# Patient Record
Sex: Female | Born: 2000 | Race: Black or African American | Hispanic: No | Marital: Single | Smoking: Former smoker
Health system: Southern US, Community
[De-identification: ages and names within clinical notes are randomized; demographics above are authoritative.]

## PROBLEM LIST (undated history)

## (undated) ENCOUNTER — Inpatient Hospital Stay (HOSPITAL_COMMUNITY): Payer: Self-pay

## (undated) DIAGNOSIS — E119 Type 2 diabetes mellitus without complications: Secondary | ICD-10-CM

## (undated) DIAGNOSIS — F909 Attention-deficit hyperactivity disorder, unspecified type: Secondary | ICD-10-CM

## (undated) DIAGNOSIS — E669 Obesity, unspecified: Secondary | ICD-10-CM

## (undated) DIAGNOSIS — J45909 Unspecified asthma, uncomplicated: Secondary | ICD-10-CM

## (undated) DIAGNOSIS — R7303 Prediabetes: Secondary | ICD-10-CM

## (undated) DIAGNOSIS — G43909 Migraine, unspecified, not intractable, without status migrainosus: Secondary | ICD-10-CM

## (undated) DIAGNOSIS — F319 Bipolar disorder, unspecified: Secondary | ICD-10-CM

## (undated) DIAGNOSIS — F603 Borderline personality disorder: Secondary | ICD-10-CM

## (undated) DIAGNOSIS — J309 Allergic rhinitis, unspecified: Secondary | ICD-10-CM

## (undated) DIAGNOSIS — T50902A Poisoning by unspecified drugs, medicaments and biological substances, intentional self-harm, initial encounter: Secondary | ICD-10-CM

## (undated) DIAGNOSIS — F32A Depression, unspecified: Secondary | ICD-10-CM

## (undated) DIAGNOSIS — G4730 Sleep apnea, unspecified: Secondary | ICD-10-CM

## (undated) DIAGNOSIS — T1491XA Suicide attempt, initial encounter: Secondary | ICD-10-CM

## (undated) DIAGNOSIS — Z6841 Body Mass Index (BMI) 40.0 and over, adult: Secondary | ICD-10-CM

## (undated) HISTORY — DX: Body Mass Index (BMI) 40.0 and over, adult: Z684

## (undated) HISTORY — DX: Borderline personality disorder: F60.3

## (undated) HISTORY — DX: Prediabetes: R73.03

## (undated) HISTORY — PX: ADENOIDECTOMY: SUR15

## (undated) HISTORY — PX: TONSILLECTOMY: SUR1361

## (undated) HISTORY — DX: Migraine, unspecified, not intractable, without status migrainosus: G43.909

## (undated) HISTORY — PX: WISDOM TOOTH EXTRACTION: SHX21

## (undated) HISTORY — DX: Depression, unspecified: F32.A

---
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
## 2019-01-20 MED ORDER — MELATONIN 3 MG PO TABS
3.00 | ORAL_TABLET | ORAL | Status: DC
Start: 2019-01-20 — End: 2019-01-20

## 2019-01-20 MED ORDER — GENERIC EXTERNAL MEDICATION
10.00 | Status: DC
Start: ? — End: 2019-01-20

## 2019-01-20 MED ORDER — SODIUM CHLORIDE 0.9 % IV SOLN
10.00 | INTRAVENOUS | Status: DC
Start: ? — End: 2019-01-20

## 2019-01-20 MED ORDER — HYDROXYZINE PAMOATE 25 MG PO CAPS
25.00 | ORAL_CAPSULE | ORAL | Status: DC
Start: ? — End: 2019-01-20

## 2019-03-09 ENCOUNTER — Encounter (HOSPITAL_COMMUNITY): Payer: Self-pay | Admitting: Emergency Medicine

## 2019-03-09 ENCOUNTER — Inpatient Hospital Stay (HOSPITAL_COMMUNITY)
Admission: EM | Admit: 2019-03-09 | Discharge: 2019-03-12 | DRG: 918 | Disposition: A | Discharge status: Other Acute Inpatient Hospital | Payer: Medicaid Other | Attending: Internal Medicine | Admitting: Internal Medicine

## 2019-03-09 DIAGNOSIS — E66813 Obesity, class 3: Secondary | ICD-10-CM

## 2019-03-09 DIAGNOSIS — R000 Tachycardia, unspecified: Secondary | ICD-10-CM | POA: Diagnosis present

## 2019-03-09 DIAGNOSIS — T43212A Poisoning by selective serotonin and norepinephrine reuptake inhibitors, intentional self-harm, initial encounter: Secondary | ICD-10-CM | POA: Diagnosis present

## 2019-03-09 DIAGNOSIS — E669 Obesity, unspecified: Secondary | ICD-10-CM

## 2019-03-09 DIAGNOSIS — Z6841 Body Mass Index (BMI) 40.0 and over, adult: Secondary | ICD-10-CM

## 2019-03-09 DIAGNOSIS — I952 Hypotension due to drugs: Secondary | ICD-10-CM | POA: Diagnosis present

## 2019-03-09 DIAGNOSIS — T50901A Poisoning by unspecified drugs, medicaments and biological substances, accidental (unintentional), initial encounter: Secondary | ICD-10-CM | POA: Diagnosis present

## 2019-03-09 DIAGNOSIS — F319 Bipolar disorder, unspecified: Secondary | ICD-10-CM | POA: Diagnosis present

## 2019-03-09 DIAGNOSIS — T50902A Poisoning by unspecified drugs, medicaments and biological substances, intentional self-harm, initial encounter: Secondary | ICD-10-CM | POA: Diagnosis not present

## 2019-03-09 DIAGNOSIS — T426X2A Poisoning by other antiepileptic and sedative-hypnotic drugs, intentional self-harm, initial encounter: Principal | ICD-10-CM | POA: Diagnosis present

## 2019-03-09 DIAGNOSIS — Z7984 Long term (current) use of oral hypoglycemic drugs: Secondary | ICD-10-CM

## 2019-03-09 DIAGNOSIS — T461X2A Poisoning by calcium-channel blockers, intentional self-harm, initial encounter: Secondary | ICD-10-CM | POA: Diagnosis present

## 2019-03-09 DIAGNOSIS — J45909 Unspecified asthma, uncomplicated: Secondary | ICD-10-CM | POA: Diagnosis present

## 2019-03-09 DIAGNOSIS — E119 Type 2 diabetes mellitus without complications: Secondary | ICD-10-CM | POA: Diagnosis present

## 2019-03-09 DIAGNOSIS — G4733 Obstructive sleep apnea (adult) (pediatric): Secondary | ICD-10-CM | POA: Diagnosis present

## 2019-03-09 DIAGNOSIS — Z62820 Parent-biological child conflict: Secondary | ICD-10-CM | POA: Diagnosis present

## 2019-03-09 DIAGNOSIS — T1491XA Suicide attempt, initial encounter: Secondary | ICD-10-CM | POA: Diagnosis not present

## 2019-03-09 DIAGNOSIS — Z818 Family history of other mental and behavioral disorders: Secondary | ICD-10-CM

## 2019-03-09 DIAGNOSIS — R9431 Abnormal electrocardiogram [ECG] [EKG]: Secondary | ICD-10-CM | POA: Diagnosis present

## 2019-03-09 DIAGNOSIS — Z8249 Family history of ischemic heart disease and other diseases of the circulatory system: Secondary | ICD-10-CM

## 2019-03-09 DIAGNOSIS — E1169 Type 2 diabetes mellitus with other specified complication: Secondary | ICD-10-CM | POA: Diagnosis not present

## 2019-03-09 DIAGNOSIS — I10 Essential (primary) hypertension: Secondary | ICD-10-CM | POA: Diagnosis present

## 2019-03-09 DIAGNOSIS — Z915 Personal history of self-harm: Secondary | ICD-10-CM

## 2019-03-09 DIAGNOSIS — T383X2A Poisoning by insulin and oral hypoglycemic [antidiabetic] drugs, intentional self-harm, initial encounter: Secondary | ICD-10-CM | POA: Diagnosis present

## 2019-03-09 LAB — CBC WITH DIFFERENTIAL/PLATELET
Abs Immature Granulocytes: 0.05 10*3/uL (ref 0.00–0.07)
Abs Immature Granulocytes: 0.03 10*3/uL (ref 0.00–0.07)
Basophils Absolute: 0.1 10*3/uL (ref 0.0–0.1)
Basophils Absolute: 0.1 10*3/uL (ref 0.0–0.1)
Basophils Relative: 1 %
Basophils Relative: 1 %
Eosinophils Absolute: 0.3 10*3/uL (ref 0.0–0.5)
Eosinophils Absolute: 0.3 10*3/uL (ref 0.0–0.5)
Eosinophils Relative: 3 %
Eosinophils Relative: 3 %
HCT: 38.6 % (ref 36.0–46.0)
HCT: 36.2 % (ref 36.0–46.0)
Hemoglobin: 12.5 g/dL (ref 12.0–15.0)
Hemoglobin: 11.4 g/dL — ABNORMAL LOW (ref 12.0–15.0)
Immature Granulocytes: 1 %
Immature Granulocytes: 0 %
Lymphocytes Relative: 28 %
Lymphocytes Relative: 37 %
Lymphs Abs: 3.1 10*3/uL (ref 0.7–4.0)
Lymphs Abs: 3.8 10*3/uL (ref 0.7–4.0)
MCH: 30.3 pg (ref 26.0–34.0)
MCH: 29.8 pg (ref 26.0–34.0)
MCHC: 32.4 g/dL (ref 30.0–36.0)
MCHC: 31.5 g/dL (ref 30.0–36.0)
MCV: 93.5 fL (ref 80.0–100.0)
MCV: 94.5 fL (ref 80.0–100.0)
Monocytes Absolute: 0.5 10*3/uL (ref 0.1–1.0)
Monocytes Absolute: 0.6 10*3/uL (ref 0.1–1.0)
Monocytes Relative: 5 %
Monocytes Relative: 6 %
Neutro Abs: 6.8 10*3/uL (ref 1.7–7.7)
Neutro Abs: 5.6 10*3/uL (ref 1.7–7.7)
Neutrophils Relative %: 62 %
Neutrophils Relative %: 53 %
Platelets: 364 10*3/uL (ref 150–400)
Platelets: 337 10*3/uL (ref 150–400)
RBC: 4.13 MIL/uL (ref 3.87–5.11)
RBC: 3.83 MIL/uL — ABNORMAL LOW (ref 3.87–5.11)
RDW: 13.7 % (ref 11.5–15.5)
RDW: 13.8 % (ref 11.5–15.5)
WBC: 10.8 10*3/uL — ABNORMAL HIGH (ref 4.0–10.5)
WBC: 10.3 10*3/uL (ref 4.0–10.5)
nRBC: 0 % (ref 0.0–0.2)
nRBC: 0 % (ref 0.0–0.2)

## 2019-03-09 LAB — COMPREHENSIVE METABOLIC PANEL
ALT: 15 U/L (ref 0–44)
AST: 25 U/L (ref 15–41)
Albumin: 3.7 g/dL (ref 3.5–5.0)
Alkaline Phosphatase: 96 U/L (ref 38–126)
Anion gap: 8 (ref 5–15)
BUN: 12 mg/dL (ref 6–20)
CO2: 21 mmol/L — ABNORMAL LOW (ref 22–32)
Calcium: 8.7 mg/dL — ABNORMAL LOW (ref 8.9–10.3)
Chloride: 107 mmol/L (ref 98–111)
Creatinine, Ser: 0.92 mg/dL (ref 0.44–1.00)
GFR calc Af Amer: >60 mL/min (ref >60)
GFR calc non Af Amer: >60 mL/min (ref >60)
Glucose, Bld: 98 mg/dL (ref 70–99)
Potassium: 4.8 mmol/L (ref 3.5–5.1)
Sodium: 136 mmol/L (ref 135–145)
Total Bilirubin: 1 mg/dL (ref 0.3–1.2)
Total Protein: 7.5 g/dL (ref 6.5–8.1)

## 2019-03-09 LAB — BASIC METABOLIC PANEL
Anion gap: 5 (ref 5–15)
BUN: 12 mg/dL (ref 6–20)
CO2: 20 mmol/L — ABNORMAL LOW (ref 22–32)
Calcium: 8.2 mg/dL — ABNORMAL LOW (ref 8.9–10.3)
Chloride: 112 mmol/L — ABNORMAL HIGH (ref 98–111)
Creatinine, Ser: 0.77 mg/dL (ref 0.44–1.00)
GFR calc Af Amer: >60 mL/min (ref >60)
GFR calc non Af Amer: >60 mL/min (ref >60)
Glucose, Bld: 103 mg/dL — ABNORMAL HIGH (ref 70–99)
Potassium: 4 mmol/L (ref 3.5–5.1)
Sodium: 137 mmol/L (ref 135–145)

## 2019-03-09 LAB — GLUCOSE, CAPILLARY
Glucose-Capillary: 104 mg/dL — ABNORMAL HIGH (ref 70–99)
Glucose-Capillary: 82 mg/dL (ref 70–99)
Glucose-Capillary: 91 mg/dL (ref 70–99)
Glucose-Capillary: 93 mg/dL (ref 70–99)
Glucose-Capillary: 94 mg/dL (ref 70–99)

## 2019-03-09 LAB — POTASSIUM: Potassium: 3.7 mmol/L (ref 3.5–5.1)

## 2019-03-09 LAB — LACTIC ACID, PLASMA
Lactic Acid, Venous: 1.7 mmol/L (ref 0.5–1.9)
Lactic Acid, Venous: 1.6 mmol/L (ref 0.5–1.9)
Lactic Acid, Venous: 1.7 mmol/L (ref 0.5–1.9)

## 2019-03-09 LAB — RAPID URINE DRUG SCREEN, HOSP PERFORMED
Amphetamines: NOT DETECTED
Barbiturates: NOT DETECTED
Benzodiazepines: NOT DETECTED
Cocaine: NOT DETECTED
Opiates: NOT DETECTED
Tetrahydrocannabinol: NOT DETECTED

## 2019-03-09 LAB — HEPATIC FUNCTION PANEL
ALT: 15 U/L (ref 0–44)
AST: 16 U/L (ref 15–41)
Albumin: 3.4 g/dL — ABNORMAL LOW (ref 3.5–5.0)
Alkaline Phosphatase: 86 U/L (ref 38–126)
Bilirubin, Direct: 0.1 mg/dL (ref 0.0–0.2)
Indirect Bilirubin: 0.3 mg/dL (ref 0.3–0.9)
Total Bilirubin: 0.4 mg/dL (ref 0.3–1.2)
Total Protein: 6.7 g/dL (ref 6.5–8.1)

## 2019-03-09 LAB — ACETAMINOPHEN LEVEL
Acetaminophen (Tylenol), Serum: <10 ug/mL — ABNORMAL LOW (ref 10–30)
Acetaminophen (Tylenol), Serum: <10 ug/mL — ABNORMAL LOW (ref 10–30)

## 2019-03-09 LAB — ETHANOL: Alcohol, Ethyl (B): <10 mg/dL (ref <10)

## 2019-03-09 LAB — CBG MONITORING, ED
Glucose-Capillary: 84 mg/dL (ref 70–99)
Glucose-Capillary: 97 mg/dL (ref 70–99)

## 2019-03-09 LAB — MAGNESIUM
Magnesium: 1.7 mg/dL (ref 1.7–2.4)
Magnesium: 2.3 mg/dL (ref 1.7–2.4)
Magnesium: 1.9 mg/dL (ref 1.7–2.4)

## 2019-03-09 LAB — SALICYLATE LEVEL: Salicylate Lvl: <7 mg/dL (ref 2.8–30.0)

## 2019-03-09 MED ORDER — MAGNESIUM OXIDE 400 (241.3 MG) MG PO TABS
400.0000 mg | ORAL_TABLET | Freq: Two times a day (BID) | ORAL | Status: AC
  Administered 2019-03-09 – 2019-03-12 (×6): 400 mg via ORAL
  Filled 2019-03-09 (×6): qty 1

## 2019-03-09 MED ORDER — ALBUTEROL SULFATE HFA 108 (90 BASE) MCG/ACT IN AERS
1.0000 | INHALATION_SPRAY | Freq: Four times a day (QID) | RESPIRATORY_TRACT | Status: DC | PRN
  Administered 2019-03-09 – 2019-03-12 (×8): 1 via RESPIRATORY_TRACT
  Filled 2019-03-09: qty 6.7

## 2019-03-09 MED ORDER — SODIUM CHLORIDE 0.9 % IV BOLUS (SEPSIS)
1000.0000 mL | Freq: Once | INTRAVENOUS | Status: AC
  Administered 2019-03-09: 1000 mL via INTRAVENOUS

## 2019-03-09 MED ORDER — SODIUM CHLORIDE 0.9 % IV SOLN
INTRAVENOUS | Status: AC
  Administered 2019-03-09 – 2019-03-10 (×3): via INTRAVENOUS

## 2019-03-09 MED ORDER — AEROCHAMBER Z-STAT PLUS/MEDIUM MISC
1.0000 | Freq: Once | Status: AC
  Administered 2019-03-09: 01:00:00 1
  Filled 2019-03-09: qty 1

## 2019-03-09 MED ORDER — POTASSIUM CHLORIDE CRYS ER 20 MEQ PO TBCR
20.0000 meq | EXTENDED_RELEASE_TABLET | Freq: Every day | ORAL | Status: DC
  Administered 2019-03-09 – 2019-03-12 (×4): 20 meq via ORAL
  Filled 2019-03-09 (×4): qty 1

## 2019-03-09 MED ORDER — SODIUM CHLORIDE 0.9 % IV SOLN: INTRAVENOUS | Status: DC

## 2019-03-09 MED ORDER — ALBUTEROL SULFATE HFA 108 (90 BASE) MCG/ACT IN AERS
8.0000 | INHALATION_SPRAY | Freq: Once | RESPIRATORY_TRACT | Status: AC
  Administered 2019-03-09: 8 via RESPIRATORY_TRACT
  Filled 2019-03-09: qty 6.7

## 2019-03-09 MED ORDER — ONDANSETRON HCL 4 MG/2ML IJ SOLN
4.0000 mg | Freq: Once | INTRAMUSCULAR | Status: AC
  Administered 2019-03-09: 01:00:00 4 mg via INTRAVENOUS
  Filled 2019-03-09: qty 2

## 2019-03-09 MED ORDER — MAGNESIUM SULFATE 2 GM/50ML IV SOLN
2.0000 g | Freq: Once | INTRAVENOUS | Status: AC
  Administered 2019-03-09: 02:00:00 2 g via INTRAVENOUS
  Filled 2019-03-09: qty 50

## 2019-03-09 NOTE — Progress Notes (Signed)
Pt received from [facility] ED. VSS. Pt states that she still has suicidal ideations. Sitter at bedside. All unnecessary equipment removed from room. Telemetry applied. Will continue to monitor.  Versie Starks, RN

## 2019-03-09 NOTE — ED Notes (Signed)
Bed: RESB Expected date:  Expected time:  Means of arrival:  Comments: 52F OD/SI

## 2019-03-09 NOTE — ED Notes (Signed)
Pt is aware a urine sample is needed but is unable to provide one at this time. 

## 2019-03-09 NOTE — ED Notes (Signed)
Maryanna at [facility] 4E made aware that the pt will be transported via [facility]

## 2019-03-09 NOTE — Progress Notes (Signed)
Patient seen and examined, agree with admitting physicians inputs.  Please refer to the admission note done this morning.  Briefly 18 year old female with bipolar disorder, asthma, diabetes mellitus, hypertension with morbid obesity sleep apnea brought to the ER after intentional overdose with multiple medication for suicidal attempt.  " Patient took it around 12:10 AM this night.  Patient states she took gabapentin 2100 mg metformin 3500 mg Topamax 1400 mg venlafaxine 1050 mg and amlodipine 35 mg." Poison control was contacted this morning  QTC at 458 on admission, normal lactic acid, bicarb at 20-21 and advised monitoring for 24 hours On my exam patient was sleeping and woke up- was alert awake oriented x3, still having ongoing SI. HR in 70s and BP stable.per poison control Monitor Mental status for gabapentin Monitor lactate for metformin and has been normal- if significantly high may need dialysis. Monitor VS, bp/hr for amlodipine Monitor for serotonin syndrome for venlafaxine and check EKG q 6 hr for QTC and QRS- if qtc prolonged check mag/k and if QRS prolonged than 120 ms will need bicarb at 1-2 meq/kg until qrs corrects.  I have consulted inpatient psychiatry and you will need involuntary commitment if patient plans to leave AMA.
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
## 2019-03-09 NOTE — H&P (Signed)
History and Physical    Whitney Beard FMB:[id number] DOB: Aug 20, 2001 DOA: 03/09/2019  PCP: [name], MD  Patient coming from: Home.  Chief Complaint: Drug overdose.  HPI: Whitney Beard is a 18 y.o. female with history of bipolar disorder, asthma, diabetes mellitus, hypertension, morbid obesity, sleep apnea was brought to the ER after patient intentionally took multiple drug overdose with intention to kill herself.  Patient had some misunderstanding with the family member following which patient took this medications.  Patient took it around 12:10 AM this night.  Patient states she took gabapentin 2100 mg metformin 3500 mg Topamax 1400 mg venlafaxine 1050 mg and amlodipine 35 mg.   ED Course: In the ER patient was initially tachycardic and mildly hypotensive which improved with fluids.  EKG was showing sinus tachycardia with QTC of 458 ms QRS of 84 ms.  Lactate was normal.  Poison control at this time advised monitoring for 24 hours.  On my exam patient is sleepy but easily arousable and oriented to name and place.  Review of Systems: As per HPI, rest all negative.   Past Medical History:  Diagnosis Date  . ADHD (attention deficit hyperactivity disorder)   . Allergic rhinitis   . Asthma   . Bipolar 1 disorder (HCC)   . Diabetes mellitus without complication (HCC)    states today 03/09/16 "was told in the past that she was borderline diabetic"  . Obesity   . Sleep apnea   . Suicide attempt by drug ingestion [facility])     Past Surgical History:  Procedure Laterality Date  . ADENOIDECTOMY    . TONSILLECTOMY       reports that she has never smoked. She has never used smokeless tobacco. She reports that she does not drink alcohol or use drugs.  No Known Allergies  Family History  Problem Relation Age of Onset  . Bipolar disorder Father   . Schizophrenia Father   . Bipolar disorder Maternal Grandmother   . SIDS Maternal Aunt   . Heart disease Paternal Grandmother     Prior to  Admission medications   Medication Sig Start Date End Date Taking? Authorizing Provider  albuterol (PROVENTIL HFA;VENTOLIN HFA) 108 (90 Base) MCG/ACT inhaler Inhale 2 puffs into the lungs every 4 (four) hours as needed. For wheeze or shortness of breath 12/27/18  Yes Pokhrel, Laxman, MD  amLODipine (NORVASC) 5 MG tablet Take 1 tablet (5 mg total) by mouth daily for 30 days. 12/27/18 03/09/19 Yes Pokhrel, Laxman, MD  beclomethasone (QVAR) 80 MCG/ACT inhaler Inhale 2 puffs into the lungs 2 (two) times daily. 01/22/17  Yes Duffus, Huntley Dec, MD  lurasidone (LATUDA) 20 MG TABS tablet Take 1 tablet (20 mg total) by mouth daily with breakfast. 01/19/18  Yes Denzil Magnuson, NP  topiramate (TOPAMAX) 100 MG tablet Take 1 tablet (100 mg total) by mouth 2 (two) times daily. 03/19/16  Yes Denzil Magnuson, NP  albuterol (PROVENTIL) (2.5 MG/3ML) 0.083% nebulizer solution Take 3 mLs (2.5 mg total) by nebulization every 6 (six) hours as needed for wheezing or shortness of breath. 12/27/18   Pokhrel, Rebekah Chesterfield, MD  escitalopram (LEXAPRO) 10 MG tablet Take 1 tablet (10 mg total) by mouth at bedtime. Patient not taking: Reported on 12/26/2018 01/19/18   Denzil Magnuson, NP  hydrOXYzine (ATARAX/VISTARIL) 10 MG tablet Take 1 tablet (10 mg total) by mouth 3 (three) times daily as needed for anxiety. Patient not taking: Reported on 12/26/2018 01/19/18   Denzil Magnuson, NP  metFORMIN (GLUCOPHAGE) 500 MG tablet  Take 1 tablet (500 mg total) by mouth daily with breakfast. Patient not taking: Reported on 12/26/2018 01/19/18   Denzil Magnusonhomas, Lashunda, NP  predniSONE (STERAPRED UNI-PAK 21 TAB) 10 MG (21) TBPK tablet 3 tab orally daily x 2 days then 2 tab po daily x 2 days then 1 tab po daily x 2 days 12/27/18   Joycelyn DasPokhrel, Laxman, MD    Physical Exam: Vitals:   03/09/19 0220 03/09/19 0300 03/09/19 0344 03/09/19 0432  BP: (!) 142/73 (!) 154/91 (!) 154/91 (!) 154/86  Pulse: (!) 104  96 99  Resp: (!) 23 (!) 21 20 20   Temp:      TempSrc:      SpO2:  98% 98% 99% 98%  Weight:      Height:          Constitutional: Moderately built and nourished. Vitals:   03/09/19 0220 03/09/19 0300 03/09/19 0344 03/09/19 0432  BP: (!) 142/73 (!) 154/91 (!) 154/91 (!) 154/86  Pulse: (!) 104  96 99  Resp: (!) 23 (!) 21 20 20   Temp:      TempSrc:      SpO2: 98% 98% 99% 98%  Weight:      Height:       Eyes: Anicteric no pallor. ENMT: No discharge from the ears eyes nose and mouth. Neck: No mass felt.  No neck rigidity. Respiratory: No rhonchi or crepitations. Cardiovascular: S1-S2 heard. Abdomen: Soft nontender bowel sounds present. Musculoskeletal: No edema.  No joint effusion. Skin: No rash. Neurologic: Sleepy but easily arousable follows commands moves all extremities.  Oriented to time place and person. Psychiatric: Depressed and suicidal.   Labs on Admission: I have personally reviewed following labs and imaging studies  CBC: Recent Labs  Lab 03/09/19 0056  WBC 10.8*  NEUTROABS 6.8  HGB 12.5  HCT 38.6  MCV 93.5  PLT 364   Basic Metabolic Panel: Recent Labs  Lab 03/09/19 0056 03/09/19 0100  NA 136  --   K 4.8  --   CL 107  --   CO2 21*  --   GLUCOSE 98  --   BUN 12  --   CREATININE 0.92  --   CALCIUM 8.7*  --   MG  --  1.7   GFR: Estimated Creatinine Clearance: 158 mL/min (by C-G formula based on SCr of 0.92 mg/dL). Liver Function Tests: Recent Labs  Lab 03/09/19 0056  AST 25  ALT 15  ALKPHOS 96  BILITOT 1.0  PROT 7.5  ALBUMIN 3.7   No results for input(s): LIPASE, AMYLASE in the last 168 hours. No results for input(s): AMMONIA in the last 168 hours. Coagulation Profile: No results for input(s): INR, PROTIME in the last 168 hours. Cardiac Enzymes: No results for input(s): CKTOTAL, CKMB, CKMBINDEX, TROPONINI in the last 168 hours. BNP (last 3 results) No results for input(s): PROBNP in the last 8760 hours. HbA1C: No results for input(s): HGBA1C in the last 72 hours. CBG: Recent Labs  Lab 03/09/19  0047  GLUCAP 84   Lipid Profile: No results for input(s): CHOL, HDL, LDLCALC, TRIG, CHOLHDL, LDLDIRECT in the last 72 hours. Thyroid Function Tests: No results for input(s): TSH, T4TOTAL, FREET4, T3FREE, THYROIDAB in the last 72 hours. Anemia Panel: No results for input(s): VITAMINB12, FOLATE, FERRITIN, TIBC, IRON, RETICCTPCT in the last 72 hours. Urine analysis:    Component Value Date/Time   COLORURINE YELLOW 01/24/2017 1140   APPEARANCEUR HAZY (A) 01/24/2017 1140   LABSPEC 1.025 01/24/2017 1140  PHURINE 5.0 01/24/2017 1140   GLUCOSEU NEGATIVE 01/24/2017 1140   HGBUR NEGATIVE 01/24/2017 1140   BILIRUBINUR NEGATIVE 01/24/2017 1140   KETONESUR NEGATIVE 01/24/2017 1140   PROTEINUR NEGATIVE 01/24/2017 1140   UROBILINOGEN 0.2 01/08/2013 2339   NITRITE NEGATIVE 01/24/2017 1140   LEUKOCYTESUR NEGATIVE 01/24/2017 1140   Sepsis Labs: (procalcitonin:4,lacticidven:4) )No results found for this or any previous visit (from the past 240 hour(s)).   Radiological Exams on Admission: No results found.  EKG: Independently reviewed.  Sinus tachycardia QTC 458 ms QRS 84 ms.  Assessment/Plan Principal Problem:   Drug overdose Active Problems:   Overdose, intentional self-harm, initial encounter (HCC)   OSA (obstructive sleep apnea)   Morbid obesity with BMI of 50.0-59.9, adult (HCC)   Diabetes mellitus type 2 in obese (HCC)    1. Intentional drug overdose with multiple drugs as mentioned history of present illness which includes gabapentin metformin amlodipine Topamax venlafaxine.  Poison control has advised to follow mental status for gabapentin overdose and lactate levels for metformin overdose.  If lactate levels go very significantly high may need dialysis.  If amlodipine to watch out for hypotension and bradycardia.  And for venlafaxine closely monitor for any serotonin syndrome and also check EKG every 6 hourly for next 24 hours since we do not know if patient took  long-acting venlafaxine or short acting and need to monitor QTC and QRS every 6 hours.  QTC is prolonged then need to check potassium and mag keep it at the higher level of normal.  And if QRS is prolonged more than 120 ms we need to give 1 to 2 mEq/kg body weight of bicarb until QRS corrects. 2. Depression with suicidal ideation -on suicide precaution consult psychiatry. 3. Diabetes mellitus type 2 with metformin overdose for now I have only kept patient on CBG checks did not placed on any coverage at this time. 4. Hypertension with amlodipine overdose close monitoring for blood pressure trends.   DVT prophylaxis: SCDs. Code Status: Full code. Family Communication: No family at the bedside. Disposition Plan: To be determined. Consults called: None. Admission status: Observation.   Eduard Clos MD [facility] Pager [phone].  If 7PM-7AM, please contact night-coverage [email or web address] Password [email or web address]  03/09/2019, 5:03 AM

## 2019-03-09 NOTE — ED Notes (Signed)
Pt changed into gown, belongings placed in cabinets at nurses station.

## 2019-03-09 NOTE — ED Notes (Signed)
[facility] called and notified need for transport. Per [facility]: May be 4-5 hrs until pt can be transported
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
## 2019-03-09 NOTE — ED Notes (Signed)
[employer] called for non-emergent transport to [facility]. Dispatch to send transport.

## 2019-03-09 NOTE — ED Notes (Signed)
Report given to [facility] for transport to [facility]

## 2019-03-09 NOTE — ED Notes (Signed)
Spoke with PC and there were already aware of patient's condition and overdose. PC recommended an acetaminophen level at 4 am, a CMP with lactate, and magnesium. PC said if lactate is high and bicarb is low, they may consider dialysis. Said gabapentin would make patient drowsy, metformin would causes lactic acidosis, amlodipine can cause low HR and BP and there is a potential for seizures.
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
## 2019-03-09 NOTE — ED Notes (Addendum)
Attempted to call report to [facility]. Unable to take report at this time. RN to call back for report

## 2019-03-09 NOTE — ED Notes (Signed)
EKG due every 6 hours per hospitalist.
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
## 2019-03-10 ENCOUNTER — Other Ambulatory Visit: Payer: Self-pay

## 2019-03-10 LAB — COMPREHENSIVE METABOLIC PANEL
ALT: 13 U/L (ref 0–44)
AST: 11 U/L — ABNORMAL LOW (ref 15–41)
Albumin: 3 g/dL — ABNORMAL LOW (ref 3.5–5.0)
Alkaline Phosphatase: 90 U/L (ref 38–126)
Anion gap: 8 (ref 5–15)
BUN: 8 mg/dL (ref 6–20)
CO2: 20 mmol/L — ABNORMAL LOW (ref 22–32)
Calcium: 8.7 mg/dL — ABNORMAL LOW (ref 8.9–10.3)
Chloride: 108 mmol/L (ref 98–111)
Creatinine, Ser: 0.82 mg/dL (ref 0.44–1.00)
GFR calc Af Amer: >60 mL/min (ref >60)
GFR calc non Af Amer: >60 mL/min (ref >60)
Glucose, Bld: 96 mg/dL (ref 70–99)
Potassium: 4.3 mmol/L (ref 3.5–5.1)
Sodium: 136 mmol/L (ref 135–145)
Total Bilirubin: 0.4 mg/dL (ref 0.3–1.2)
Total Protein: 6 g/dL — ABNORMAL LOW (ref 6.5–8.1)

## 2019-03-10 LAB — GLUCOSE, CAPILLARY
Glucose-Capillary: 110 mg/dL — ABNORMAL HIGH (ref 70–99)
Glucose-Capillary: 70 mg/dL (ref 70–99)
Glucose-Capillary: 89 mg/dL (ref 70–99)
Glucose-Capillary: 93 mg/dL (ref 70–99)
Glucose-Capillary: 96 mg/dL (ref 70–99)
Glucose-Capillary: 99 mg/dL (ref 70–99)

## 2019-03-10 LAB — LACTIC ACID, PLASMA: Lactic Acid, Venous: 1.3 mmol/L (ref 0.5–1.9)

## 2019-03-10 MED ORDER — ACETAMINOPHEN 325 MG PO TABS
650.0000 mg | ORAL_TABLET | Freq: Four times a day (QID) | ORAL | Status: DC | PRN
  Administered 2019-03-10 – 2019-03-11 (×3): 650 mg via ORAL
  Filled 2019-03-10 (×4): qty 2

## 2019-03-10 NOTE — Plan of Care (Signed)
  Problem: Clinical Measurements: Goal: Will remain free from infection Outcome: Progressing Goal: Cardiovascular complication will be avoided Outcome: Progressing   

## 2019-03-10 NOTE — Discharge Summary (Addendum)
Physician Discharge Summary  Whitney Beard RUE:[id number] DOB: Aug 29, 2001 DOA: 03/09/2019  PCP: [name], MD  Admit date: 03/09/2019 Discharge date: 03/12/2019  Admitted From: home Disposition:  Inpatient psych  Recommendations for Outpatient Follow-up:  1. Follow up with PCP/psych in 1-2 weeks  Home Health:no  Equipment/Devices:none Discharge Condition:stable  CODE STATUS:full  Diet recommendation: Cardiac diabetic diet  Brief/Interim Summary: 18 year old female with bipolar disorder, asthma, diabetes mellitus, hypertension with morbid obesity sleep apnea brought to the ER after intentional overdose with multiple medication for suicidal attempt.  "Patient took it around 12:10 AM this night. Patient states she took gabapentin 2100 mg metformin 3500 mg Topamax 1400 mg venlafaxine 1050 mg and amlodipine 35 mg." Poison control was contacted and advised monitoring 24 hrs. QTC at 458 on admission and now470 stable, qrs stable. normal lactic acid, labs stable She is aa0x4, agreeable w POC for inpatient psych, initially somewhat reluctant. BP, HR stable. Mental status stable. Patient is medically stable for discharge to inpatient psychiatric facility as per psychiatry recommendation. HTN: bp stable. meds on hold. Resume amlodipine 5 mg in 1-2 days if BP starts to go above 140. Rest of her psychotropic Home meds on hold, assess for need to resume at inpatient psychiatry. DM- resume metformin 4/11 Asthma- w wheezing. Cont po steroid low dose and home broncholdilateors  Discharge Diagnoses:  Principal Problem:   Suicide attempt [facility]) Active Problems:   Overdose, intentional self-harm, initial encounter (HCC)   OSA (obstructive sleep apnea)   Morbid obesity with BMI of 50.0-59.9, adult (HCC)   Drug overdose   Diabetes mellitus type 2 in obese [facility])  Discharge Instructions  Discharge Instructions    Call MD for:  persistant nausea and vomiting   Complete by:  As directed    Diet -  low sodium heart healthy   Complete by:  As directed    Diet Carb Modified   Complete by:  As directed    Increase activity slowly   Complete by:  As directed      Allergies as of 03/12/2019   No Known Allergies     Medication List    STOP taking these medications   amLODipine 5 MG tablet Commonly known as:  NORVASC   gabapentin 300 MG capsule Commonly known as:  NEURONTIN   topiramate 100 MG tablet Commonly known as:  TOPAMAX   venlafaxine XR 150 MG 24 hr capsule Commonly known as:  EFFEXOR-XR     TAKE these medications   albuterol 108 (90 Base) MCG/ACT inhaler Commonly known as:  PROVENTIL HFA;VENTOLIN HFA Inhale 2 puffs into the lungs every 4 (four) hours as needed. For wheeze or shortness of breath What changed:    reasons to take this  additional instructions   albuterol (2.5 MG/3ML) 0.083% nebulizer solution Commonly known as:  PROVENTIL Take 3 mLs (2.5 mg total) by nebulization every 6 (six) hours as needed for wheezing or shortness of breath. What changed:  Another medication with the same name was changed. Make sure you understand how and when to take each.   beclomethasone 80 MCG/ACT inhaler Commonly known as:  QVAR Inhale 2 puffs into the lungs 2 (two) times daily.   hydrOXYzine 10 MG tablet Commonly known as:  ATARAX/VISTARIL Take 1 tablet (10 mg total) by mouth 3 (three) times daily as needed for anxiety.   lurasidone 20 MG Tabs tablet Commonly known as:  LATUDA Take 1 tablet (20 mg total) by mouth daily with breakfast.   metFORMIN 500 MG  tablet Commonly known as:  GLUCOPHAGE Take 1 tablet (500 mg total) by mouth daily with breakfast.   montelukast 10 MG tablet Commonly known as:  SINGULAIR Take 10 mg by mouth at bedtime.   predniSONE 20 MG tablet Commonly known as:  DELTASONE Take 1 tablet (20 mg total) by mouth daily with breakfast for 4 days.       No Known Allergies  Consultations: Psychiatry  Procedures/Studies: No results  found.   Subjective: Resting well, alert awake oriented x3.  No new complaints.  Vitals stable.  Discharge Exam: Vitals:   03/12/19 1245 03/12/19 1412  BP:    Pulse: (!) 116 (!) 116  Resp: (!) 26 20  Temp:    SpO2: 98% 99%   Vitals:   03/12/19 0857 03/12/19 1204 03/12/19 1245 03/12/19 1412  BP: 125/87 113/60    Pulse: 90 (!) 116 (!) 116 (!) 116  Resp: (!) 25 (!) 29 (!) 26 20  Temp: 98.7 F (37.1 C) 98.8 F (37.1 C)    TempSrc: Oral Oral    SpO2:   98% 99%  Weight:      Height:        General: Pt is alert, awake, not in acute distress Cardiovascular: RRR, S1/S2 +, no rubs, no gallops Respiratory: CTA bilaterally, no wheezing, no rhonchi Abdominal: Soft, NT, ND, bowel sounds + Extremities: no edema, no cyanosis   The results of significant diagnostics from this hospitalization (including imaging, microbiology, ancillary and laboratory) are listed below for reference.     Microbiology: No results found for this or any previous visit (from the past 240 hour(s)).   Labs: BNP (last 3 results) No results for input(s): BNP in the last 8760 hours. Basic Metabolic Panel: Recent Labs  Lab 03/09/19 0056 03/09/19 0100 03/09/19 0527 03/09/19 1424 03/10/19 0923  NA 136  --  137  --  136  K 4.8  --  4.0 3.7 4.3  CL 107  --  112*  --  108  CO2 21*  --  20*  --  20*  GLUCOSE 98  --  103*  --  96  BUN 12  --  12  --  8  CREATININE 0.92  --  0.77  --  0.82  CALCIUM 8.7*  --  8.2*  --  8.7*  MG  --  1.7 2.3 1.9  --    Liver Function Tests: Recent Labs  Lab 03/09/19 0056 03/09/19 0527 03/10/19 0923  AST 25 16 11*  ALT 15 15 13   ALKPHOS 96 86 90  BILITOT 1.0 0.4 0.4  PROT 7.5 6.7 6.0*  ALBUMIN 3.7 3.4* 3.0*   No results for input(s): LIPASE, AMYLASE in the last 168 hours. No results for input(s): AMMONIA in the last 168 hours. CBC: Recent Labs  Lab 03/09/19 0056 03/09/19 0527  WBC 10.8* 10.3  NEUTROABS 6.8 5.6  HGB 12.5 11.4*  HCT 38.6 36.2  MCV 93.5  94.5  PLT 364 337   Cardiac Enzymes: No results for input(s): CKTOTAL, CKMB, CKMBINDEX, TROPONINI in the last 168 hours. BNP: Invalid input(s): POCBNP CBG: Recent Labs  Lab 03/11/19 1113 03/11/19 1556 03/11/19 1955 03/12/19 0713 03/12/19 1111  GLUCAP 96 101* 79 107* 166*   D-Dimer No results for input(s): DDIMER in the last 72 hours. Hgb A1c No results for input(s): HGBA1C in the last 72 hours. Lipid Profile No results for input(s): CHOL, HDL, LDLCALC, TRIG, CHOLHDL, LDLDIRECT in the last 72 hours. Thyroid function studies  No results for input(s): TSH, T4TOTAL, T3FREE, THYROIDAB in the last 72 hours.  Invalid input(s): FREET3 Anemia work up No results for input(s): VITAMINB12, FOLATE, FERRITIN, TIBC, IRON, RETICCTPCT in the last 72 hours. Urinalysis    Component Value Date/Time   COLORURINE YELLOW 01/24/2017 1140   APPEARANCEUR HAZY (A) 01/24/2017 1140   LABSPEC 1.025 01/24/2017 1140   PHURINE 5.0 01/24/2017 1140   GLUCOSEU NEGATIVE 01/24/2017 1140   HGBUR NEGATIVE 01/24/2017 1140   BILIRUBINUR NEGATIVE 01/24/2017 1140   KETONESUR NEGATIVE 01/24/2017 1140   PROTEINUR NEGATIVE 01/24/2017 1140   UROBILINOGEN 0.2 01/08/2013 2339   NITRITE NEGATIVE 01/24/2017 1140   LEUKOCYTESUR NEGATIVE 01/24/2017 1140   Sepsis Labs Invalid input(s): PROCALCITONIN,  WBC,  LACTICIDVEN Microbiology No results found for this or any previous visit (from the past 240 hour(s)).   Time coordinating discharge: 25 minutes  SIGNED:   Lanae Boast, MD  [facility] 03/12/2019, 2:50 PM  If 7PM-7AM, please contact night-coverage [email or web address]

## 2019-03-10 NOTE — TOC Progression Note (Signed)
Transition of Care [facility]) - Progression Note    Patient Details  Name: Whitney Beard MRN: [id number] Date of Birth: 02-04-2001  Transition of Care [facility]) CM/SW Contact  Mearl Latin, LCSW Phone Number: 03/10/2019, 4:33 PM  Clinical Narrative:    [facility] reviewing referral. [facility] denied due to medical acuity. [facility] unable to accept patients 18 and under if in school. Patient states she does not want to go to several facilities, however, patient will need to go to the first available bed. Will IVC patient if needed if she ends up declining bed offer.    Expected Discharge Plan: Psychiatric Hospital Barriers to Discharge: Psych Bed not available  Expected Discharge Plan and Services Expected Discharge Plan: Psychiatric Hospital In-house Referral: Clinical Social Work Discharge Planning Services: NA Post Acute Care Choice: NA Living arrangements for the past 2 months: Single Family Home Expected Discharge Date: 03/10/19               DME Arranged: N/A DME Agency: NA HH Arranged: NA HH Agency: NA   Social Determinants of Health (SDOH) Interventions    Readmission Risk Interventions Readmission Risk Prevention Plan 03/10/2019  Post Dischage Appt Complete  Medication Screening Complete  Transportation Screening Complete  Some recent data might be hidden

## 2019-03-10 NOTE — TOC Initial Note (Signed)
Transition of Care [facility](TOC) - Initial/Assessment Note    Patient Details  Name: Whitney Beard MRN: [id number] Date of Birth: 06-15-2001  Transition of Care [facility](TOC) CM/SW Contact:    Mearl LatinNadia S Quantavius Humm, LCSW Phone Number: 03/10/2019, 10:17 AM  Clinical Narrative:                 CSW received consult for inpatient psych placement now that patient is medically stable. CSW spoke with patient. She reports that she will voluntarily go [facility]. CSW explained that we will need to go with the first available bed. CSW faxing referrals.   Expected Discharge Plan: Psychiatric Hospital Barriers to Discharge: Psych Bed not available   Patient Goals and CMS Choice Patient states their goals for this hospitalization and ongoing recovery are:: Return home      Expected Discharge Plan and Services Expected Discharge Plan: Psychiatric Hospital In-house Referral: Clinical Social Work Discharge Planning Services: NA Post Acute Care Choice: NA Living arrangements for the past 2 months: Single Family Home                 DME Arranged: N/A DME Agency: NA HH Arranged: NA HH Agency: NA  Prior Living Arrangements/Services Living arrangements for the past 2 months: Single Family Home Lives with:: Siblings, Parents Patient language and need for interpreter reviewed:: Yes Do you feel safe going back to the place where you live?: No      Need for Family Participation in Patient Care: No (Comment) Care giver support system in place?: No (comment)   Criminal Activity/Legal Involvement Pertinent to Current Situation/Hospitalization: No - Comment as needed  Activities of Daily Living Home Assistive Devices/Equipment: None ADL Screening (condition at time of admission) Patient's cognitive ability adequate to safely complete daily activities?: Yes Is the patient deaf or have difficulty hearing?: No Does the patient have difficulty seeing, even when wearing glasses/contacts?: No Does the  patient have difficulty concentrating, remembering, or making decisions?: No Patient able to express need for assistance with ADLs?: Yes Does the patient have difficulty dressing or bathing?: No Independently performs ADLs?: Yes (appropriate for developmental age) Does the patient have difficulty walking or climbing stairs?: No Weakness of Legs: None Weakness of Arms/Hands: None  Permission Sought/Granted   Permission granted to share information with : Yes, Verbal Permission Granted     Permission granted to share info w AGENCY: Psych facilities        Emotional Assessment Appearance:: Appears stated age Attitude/Demeanor/Rapport: Guarded Affect (typically observed): Accepting, Appropriate, Quiet Orientation: : Oriented to Self, Oriented to Place, Oriented to Situation, Oriented to  Time Alcohol / Substance Use: Not Applicable Psych Involvement: Yes (comment)(Inpatient psych recommended)  Admission diagnosis:  Overdose, intentional self-harm, initial encounter (HCC) [T50.902A] Drug overdose [T50.901A] Patient Active Problem List   Diagnosis Date Noted  . Drug overdose 03/09/2019  . Diabetes mellitus type 2 in obese (HCC) 03/09/2019  . Suicide attempt (HCC)   . Hypocalcemia 12/26/2018  . Hypokalemia 12/26/2018  . Diabetes mellitus without complication (HCC) 12/26/2018  . OSA (obstructive sleep apnea) 12/26/2018  . Morbid obesity with BMI of 50.0-59.9, adult (HCC) 12/26/2018  . Asthma exacerbation 10/16/2017  . Status asthmaticus 01/19/2017  . 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 MDD (major depressive disorder), recurrent episode, moderate (HCC) 10/14/2016  . Suicidal ideation 10/14/2016  . MDD (major depressive disorder), recurrent severe, without psychosis (HCC) 03/12/2016  . Depression 03/11/2016  . Overdose 03/09/2016  . Drug ingestion 03/09/2016  .  Overdose, intentional self-harm, initial encounter (HCC)   . Affective psychosis, bipolar (HCC)    PCP:   [name], MD Pharmacy:   [id number] - [place], [place] - [address] [place] [address] Phone: [phone] Fax: [phone]     Social Determinants of Health (SDOH) Interventions    Readmission Risk Interventions Readmission Risk Prevention Plan 03/10/2019  Post Dischage Appt Complete  Medication Screening Complete  Transportation Screening Complete  Some recent data might be hidden

## 2019-03-11 DIAGNOSIS — I10 Essential (primary) hypertension: Secondary | ICD-10-CM | POA: Diagnosis present

## 2019-03-11 DIAGNOSIS — T426X2A Poisoning by other antiepileptic and sedative-hypnotic drugs, intentional self-harm, initial encounter: Secondary | ICD-10-CM | POA: Diagnosis present

## 2019-03-11 DIAGNOSIS — T43212A Poisoning by selective serotonin and norepinephrine reuptake inhibitors, intentional self-harm, initial encounter: Secondary | ICD-10-CM | POA: Diagnosis present

## 2019-03-11 DIAGNOSIS — T461X2A Poisoning by calcium-channel blockers, intentional self-harm, initial encounter: Secondary | ICD-10-CM | POA: Diagnosis present

## 2019-03-11 DIAGNOSIS — Z7984 Long term (current) use of oral hypoglycemic drugs: Secondary | ICD-10-CM | POA: Diagnosis not present

## 2019-03-11 DIAGNOSIS — T50902A Poisoning by unspecified drugs, medicaments and biological substances, intentional self-harm, initial encounter: Secondary | ICD-10-CM | POA: Diagnosis not present

## 2019-03-11 DIAGNOSIS — E119 Type 2 diabetes mellitus without complications: Secondary | ICD-10-CM | POA: Diagnosis present

## 2019-03-11 DIAGNOSIS — Z62820 Parent-biological child conflict: Secondary | ICD-10-CM | POA: Diagnosis present

## 2019-03-11 DIAGNOSIS — Z8249 Family history of ischemic heart disease and other diseases of the circulatory system: Secondary | ICD-10-CM | POA: Diagnosis not present

## 2019-03-11 DIAGNOSIS — T383X2A Poisoning by insulin and oral hypoglycemic [antidiabetic] drugs, intentional self-harm, initial encounter: Secondary | ICD-10-CM | POA: Diagnosis present

## 2019-03-11 DIAGNOSIS — R000 Tachycardia, unspecified: Secondary | ICD-10-CM | POA: Diagnosis present

## 2019-03-11 DIAGNOSIS — I952 Hypotension due to drugs: Secondary | ICD-10-CM | POA: Diagnosis present

## 2019-03-11 DIAGNOSIS — Z818 Family history of other mental and behavioral disorders: Secondary | ICD-10-CM | POA: Diagnosis not present

## 2019-03-11 DIAGNOSIS — Z915 Personal history of self-harm: Secondary | ICD-10-CM | POA: Diagnosis not present

## 2019-03-11 DIAGNOSIS — G4733 Obstructive sleep apnea (adult) (pediatric): Secondary | ICD-10-CM | POA: Diagnosis present

## 2019-03-11 DIAGNOSIS — R9431 Abnormal electrocardiogram [ECG] [EKG]: Secondary | ICD-10-CM | POA: Diagnosis present

## 2019-03-11 DIAGNOSIS — F319 Bipolar disorder, unspecified: Secondary | ICD-10-CM | POA: Diagnosis present

## 2019-03-11 DIAGNOSIS — J45909 Unspecified asthma, uncomplicated: Secondary | ICD-10-CM | POA: Diagnosis present

## 2019-03-11 LAB — GLUCOSE, CAPILLARY
Glucose-Capillary: 94 mg/dL (ref 70–99)
Glucose-Capillary: 98 mg/dL (ref 70–99)
Glucose-Capillary: 96 mg/dL (ref 70–99)
Glucose-Capillary: 101 mg/dL — ABNORMAL HIGH (ref 70–99)
Glucose-Capillary: 79 mg/dL (ref 70–99)

## 2019-03-11 MED ORDER — INSULIN ASPART 100 UNIT/ML ~~LOC~~ SOLN: 0.0000 [IU] | Freq: Three times a day (TID) | SUBCUTANEOUS | Status: DC

## 2019-03-11 MED ORDER — ALBUTEROL SULFATE (2.5 MG/3ML) 0.083% IN NEBU
2.5000 mg | INHALATION_SOLUTION | Freq: Four times a day (QID) | RESPIRATORY_TRACT | Status: DC
  Administered 2019-03-11 – 2019-03-12 (×6): 2.5 mg via RESPIRATORY_TRACT
  Filled 2019-03-11 (×6): qty 3

## 2019-03-11 NOTE — Progress Notes (Signed)
PROGRESS NOTE    Whitney Beard  WYO:[id number] DOB: 2001/02/08 DOA: 03/09/2019 PCP: [name], MD   Brief Narrative: 18-year-old female with bipolar disorder, asthma, diabetes mellitus, hypertension with morbid obesity sleep apnea brought to the ER after intentional overdose with multiple medication for suicidal attempt. "Patient took it around 12:10 AM this night. Patient states she took gabapentin 2100 mg metformin 3500 mg Topamax 1400 mg venlafaxine 1050 mg and amlodipine 35 mg." Poison control was contacted and advised monitoring 24 hrs. QTC at 458on admission and now470 stable, qrs stable. normal lactic acid, labs stable She is aa0x4, agreeable w POC for inpatient psych, initially somewhat reluctant. BP, HR stable. Mental status stable. Patient is medically stable for discharge to inpatient psychiatric facility as per psychiatry recommendation.  Subjective: Resting well no new complaints.  Denies nausea vomiting chest pain.  Alert oriented x3.  Assessment & Plan:   Principal Problem:   Suicide attempt [facility]) Active Problems:   Overdose, intentional self-harm, initial encounter (HCC)   OSA (obstructive sleep apnea)   Morbid obesity with BMI of 50.0-59.9, adult (HCC)   Drug overdose   Diabetes mellitus type 2 in obese [facility])  Drug overdose for suicidal intention with abapentin 2100 mg metformin 3500 mg Topamax 1400 mg venlafaxine 1050 mg and amlodipine 35 mg.  Patient is currently medically stable, QTC stable, vitals are stable.  At this time she has been medically stable and cleared for discharge to inpatient psychiatry awaiting placement.  Continue suicide precaution one-to-one.  If the patient refused to go to inpatient psych voluntarily, will need to commit her involuntarily as per psych recommendation due to SI.  HTN: bp stable. meds on hold. Resume amlodipine 5 mg in 1-2 days if BP starts to go above 140. Rest of her psychotropic Home meds on hold, assess for need to resume  at inpatient psychiatry and will continue to hold. DM- resume metformin upon discharge.  Blood sugar stable in 90s monitor accucheck History of asthma with wheezing no shortness of breath, I will put her on a scheduled albuterol nebulizer, continue home inhaler  DVT prophylaxis: SCD Code Status: full Family Communication: patient Disposition Plan: Waiting for inpatient psychiatric facility   Consultants:  Psychiatry Procedures: None Antimicrobials: Anti-infectives (From admission, onward)   None       Objective: Vitals:   03/11/19 0517 03/11/19 0800 03/11/19 0831 03/11/19 0922  BP:    108/73  Pulse:   77 61  Resp:   18 16  Temp:  98.1 F (36.7 C) 98 F (36.7 C)   TempSrc:  Oral Oral   SpO2:   98% 99%  Weight: (!) 171.1 kg     Height:        Intake/Output Summary (Last 24 hours) at 03/11/2019 1034 Last data filed at 03/10/2019 2250 Gross per 24 hour  Intake 360 ml  Output -  Net 360 ml   Filed Weights   03/09/19 0912 03/10/19 0609 03/11/19 0517  Weight: (!) 174.4 kg (!) 173.3 kg (!) 171.1 kg   Weight change: -3.349 kg  Body mass index is 60.87 kg/m.  Intake/Output from previous day: 04/10 0701 - 04/11 0700 In: 360 [P.O.:360] Out: -  Intake/Output this shift: No intake/output data recorded.  Examination:  General exam: Appears calm and comfortable, obese,  HEENT:PERRL,Oral mucosa moist, Ear/Nose normal on gross exam Respiratory system: Bilateral wheezing present, fair air entry no crackles  Cardiovascular system: S1 & S2 heard,No JVD, murmurs. Gastrointestinal system: Abdomen is  soft, non  tender, non distended, BS +  Nervous System:Alert and oriented. No focal neurological deficits/moving extremities, sensation intact. Extremities: No edema, no clubbing, distal peripheral pulses palpable. Skin: No rashes, lesions, no icterus MSK: Normal muscle bulk,tone ,power  Medications:  Scheduled Meds: . albuterol  2.5 mg Nebulization Q6H  . magnesium oxide   400 mg Oral BID  . potassium chloride  20 mEq Oral Daily   Continuous Infusions:  Data Reviewed: I have personally reviewed following labs and imaging studies  CBC: Recent Labs  Lab 03/09/19 0056 03/09/19 0527  WBC 10.8* 10.3  NEUTROABS 6.8 5.6  HGB 12.5 11.4*  HCT 38.6 36.2  MCV 93.5 94.5  PLT 364 337   Basic Metabolic Panel: Recent Labs  Lab 03/09/19 0056 03/09/19 0100 03/09/19 0527 03/09/19 1424 03/10/19 0923  NA 136  --  137  --  136  K 4.8  --  4.0 3.7 4.3  CL 107  --  112*  --  108  CO2 21*  --  20*  --  20*  GLUCOSE 98  --  103*  --  96  BUN 12  --  12  --  8  CREATININE 0.92  --  0.77  --  0.82  CALCIUM 8.7*  --  8.2*  --  8.7*  MG  --  1.7 2.3 1.9  --    GFR: Estimated Creatinine Clearance: 182.7 mL/min (by C-G formula based on SCr of 0.82 mg/dL). Liver Function Tests: Recent Labs  Lab 03/09/19 0056 03/09/19 0527 03/10/19 0923  AST 25 16 11*  ALT 15 15 13   ALKPHOS 96 86 90  BILITOT 1.0 0.4 0.4  PROT 7.5 6.7 6.0*  ALBUMIN 3.7 3.4* 3.0*   No results for input(s): LIPASE, AMYLASE in the last 168 hours. No results for input(s): AMMONIA in the last 168 hours. Coagulation Profile: No results for input(s): INR, PROTIME in the last 168 hours. Cardiac Enzymes: No results for input(s): CKTOTAL, CKMB, CKMBINDEX, TROPONINI in the last 168 hours. BNP (last 3 results) No results for input(s): PROBNP in the last 8760 hours. HbA1C: No results for input(s): HGBA1C in the last 72 hours. CBG: Recent Labs  Lab 03/10/19 1632 03/10/19 1949 03/10/19 2347 03/11/19 0348 03/11/19 0756  GLUCAP 70 96 110* 94 98   Lipid Profile: No results for input(s): CHOL, HDL, LDLCALC, TRIG, CHOLHDL, LDLDIRECT in the last 72 hours. Thyroid Function Tests: No results for input(s): TSH, T4TOTAL, FREET4, T3FREE, THYROIDAB in the last 72 hours. Anemia Panel: No results for input(s): VITAMINB12, FOLATE, FERRITIN, TIBC, IRON, RETICCTPCT in the last 72 hours. Sepsis Labs:  Recent Labs  Lab 03/09/19 0100 03/09/19 0319 03/09/19 0527 03/10/19 0923  LATICACIDVEN 1.7 1.6 1.7 1.3    No results found for this or any previous visit (from the past 240 hour(s)).    Radiology Studies: No results found.    LOS: 0 days   Time spent: More than 50% of that time was spent in counseling and/or coordination of care.  Lanae Boastamesh Betzabe Bevans, MD [facility]  03/11/2019, 10:34 AM

## 2019-03-11 NOTE — Progress Notes (Signed)
Pt sitting on side of bed, labored breathing, tachypneic. Audible wheezing. O2 sats 100% Gave PRN inhaler and placed on 2L O2 for comfort. Pt breathing easier. Will continue to monitor.  Margarito Liner, RN

## 2019-03-12 ENCOUNTER — Encounter (HOSPITAL_COMMUNITY): Payer: Self-pay | Admitting: *Deleted

## 2019-03-12 ENCOUNTER — Inpatient Hospital Stay (HOSPITAL_COMMUNITY)
Admission: AD | Admit: 2019-03-12 | Discharge: 2019-03-17 | DRG: 885 | Disposition: A | Discharge status: Home / Self Care | Payer: Medicaid Other | Source: Intra-hospital | Attending: Psychiatry | Admitting: Psychiatry

## 2019-03-12 ENCOUNTER — Other Ambulatory Visit: Payer: Self-pay

## 2019-03-12 DIAGNOSIS — F410 Panic disorder [episodic paroxysmal anxiety] without agoraphobia: Secondary | ICD-10-CM | POA: Diagnosis present

## 2019-03-12 DIAGNOSIS — F322 Major depressive disorder, single episode, severe without psychotic features: Secondary | ICD-10-CM | POA: Diagnosis present

## 2019-03-12 DIAGNOSIS — J45909 Unspecified asthma, uncomplicated: Secondary | ICD-10-CM | POA: Diagnosis present

## 2019-03-12 DIAGNOSIS — T50901A Poisoning by unspecified drugs, medicaments and biological substances, accidental (unintentional), initial encounter: Secondary | ICD-10-CM | POA: Diagnosis present

## 2019-03-12 DIAGNOSIS — Z62820 Parent-biological child conflict: Secondary | ICD-10-CM | POA: Diagnosis present

## 2019-03-12 DIAGNOSIS — T43212A Poisoning by selective serotonin and norepinephrine reuptake inhibitors, intentional self-harm, initial encounter: Secondary | ICD-10-CM | POA: Diagnosis present

## 2019-03-12 DIAGNOSIS — F319 Bipolar disorder, unspecified: Secondary | ICD-10-CM | POA: Diagnosis present

## 2019-03-12 DIAGNOSIS — E119 Type 2 diabetes mellitus without complications: Secondary | ICD-10-CM | POA: Diagnosis present

## 2019-03-12 DIAGNOSIS — T461X2A Poisoning by calcium-channel blockers, intentional self-harm, initial encounter: Secondary | ICD-10-CM | POA: Diagnosis not present

## 2019-03-12 DIAGNOSIS — Y92009 Unspecified place in unspecified non-institutional (private) residence as the place of occurrence of the external cause: Secondary | ICD-10-CM

## 2019-03-12 DIAGNOSIS — R5383 Other fatigue: Secondary | ICD-10-CM | POA: Diagnosis present

## 2019-03-12 DIAGNOSIS — F332 Major depressive disorder, recurrent severe without psychotic features: Secondary | ICD-10-CM | POA: Diagnosis present

## 2019-03-12 DIAGNOSIS — T426X2A Poisoning by other antiepileptic and sedative-hypnotic drugs, intentional self-harm, initial encounter: Secondary | ICD-10-CM | POA: Diagnosis not present

## 2019-03-12 DIAGNOSIS — F418 Other specified anxiety disorders: Secondary | ICD-10-CM | POA: Diagnosis not present

## 2019-03-12 DIAGNOSIS — G4733 Obstructive sleep apnea (adult) (pediatric): Secondary | ICD-10-CM

## 2019-03-12 DIAGNOSIS — T1491XA Suicide attempt, initial encounter: Secondary | ICD-10-CM | POA: Diagnosis not present

## 2019-03-12 DIAGNOSIS — F938 Other childhood emotional disorders: Secondary | ICD-10-CM | POA: Diagnosis present

## 2019-03-12 DIAGNOSIS — Z79899 Other long term (current) drug therapy: Secondary | ICD-10-CM

## 2019-03-12 DIAGNOSIS — F909 Attention-deficit hyperactivity disorder, unspecified type: Secondary | ICD-10-CM | POA: Diagnosis present

## 2019-03-12 DIAGNOSIS — F419 Anxiety disorder, unspecified: Secondary | ICD-10-CM | POA: Diagnosis present

## 2019-03-12 DIAGNOSIS — G4730 Sleep apnea, unspecified: Secondary | ICD-10-CM | POA: Diagnosis present

## 2019-03-12 DIAGNOSIS — Z6841 Body Mass Index (BMI) 40.0 and over, adult: Secondary | ICD-10-CM

## 2019-03-12 DIAGNOSIS — Z68.41 Body mass index (BMI) pediatric, greater than or equal to 95th percentile for age: Secondary | ICD-10-CM | POA: Diagnosis not present

## 2019-03-12 DIAGNOSIS — T50902A Poisoning by unspecified drugs, medicaments and biological substances, intentional self-harm, initial encounter: Secondary | ICD-10-CM | POA: Diagnosis present

## 2019-03-12 DIAGNOSIS — T50992A Poisoning by other drugs, medicaments and biological substances, intentional self-harm, initial encounter: Secondary | ICD-10-CM | POA: Diagnosis not present

## 2019-03-12 DIAGNOSIS — G4700 Insomnia, unspecified: Secondary | ICD-10-CM | POA: Diagnosis present

## 2019-03-12 DIAGNOSIS — E1169 Type 2 diabetes mellitus with other specified complication: Secondary | ICD-10-CM

## 2019-03-12 DIAGNOSIS — Z7984 Long term (current) use of oral hypoglycemic drugs: Secondary | ICD-10-CM

## 2019-03-12 DIAGNOSIS — Z818 Family history of other mental and behavioral disorders: Secondary | ICD-10-CM

## 2019-03-12 DIAGNOSIS — E669 Obesity, unspecified: Secondary | ICD-10-CM

## 2019-03-12 DIAGNOSIS — T383X2A Poisoning by insulin and oral hypoglycemic [antidiabetic] drugs, intentional self-harm, initial encounter: Secondary | ICD-10-CM | POA: Diagnosis present

## 2019-03-12 LAB — GLUCOSE, CAPILLARY
Glucose-Capillary: 107 mg/dL — ABNORMAL HIGH (ref 70–99)
Glucose-Capillary: 166 mg/dL — ABNORMAL HIGH (ref 70–99)

## 2019-03-12 MED ORDER — IPRATROPIUM-ALBUTEROL 0.5-2.5 (3) MG/3ML IN SOLN
3.0000 mL | Freq: Four times a day (QID) | RESPIRATORY_TRACT | Status: DC | PRN
  Administered 2019-03-12 (×2): 3 mL via RESPIRATORY_TRACT
  Filled 2019-03-12 (×2): qty 3

## 2019-03-12 MED ORDER — ACETAMINOPHEN 325 MG PO TABS: 650.0000 mg | ORAL_TABLET | Freq: Four times a day (QID) | ORAL | Status: DC | PRN

## 2019-03-12 MED ORDER — PREDNISONE 20 MG PO TABS
20.0000 mg | ORAL_TABLET | Freq: Every day | ORAL | Status: AC
  Administered 2019-03-13 – 2019-03-16 (×4): 20 mg via ORAL
  Filled 2019-03-12 (×4): qty 1

## 2019-03-12 MED ORDER — ALUM & MAG HYDROXIDE-SIMETH 200-200-20 MG/5ML PO SUSP: 30.0000 mL | Freq: Four times a day (QID) | ORAL | Status: DC | PRN

## 2019-03-12 MED ORDER — ENOXAPARIN SODIUM 40 MG/0.4ML ~~LOC~~ SOLN
40.0000 mg | SUBCUTANEOUS | Status: DC
  Administered 2019-03-12: 40 mg via SUBCUTANEOUS
  Filled 2019-03-12: qty 0.4

## 2019-03-12 MED ORDER — MAGNESIUM HYDROXIDE 400 MG/5ML PO SUSP: 30.0000 mL | Freq: Every evening | ORAL | Status: DC | PRN

## 2019-03-12 MED ORDER — METFORMIN HCL 500 MG PO TABS
500.0000 mg | ORAL_TABLET | Freq: Every day | ORAL | Status: DC
  Administered 2019-03-13 – 2019-03-17 (×5): 500 mg via ORAL
  Filled 2019-03-12 (×8): qty 1

## 2019-03-12 MED ORDER — ALBUTEROL SULFATE HFA 108 (90 BASE) MCG/ACT IN AERS
1.0000 | INHALATION_SPRAY | Freq: Four times a day (QID) | RESPIRATORY_TRACT | Status: DC | PRN
  Administered 2019-03-12 – 2019-03-17 (×10): 1 via RESPIRATORY_TRACT
  Filled 2019-03-12: qty 6.7

## 2019-03-12 MED ORDER — PREDNISONE 20 MG PO TABS
40.0000 mg | ORAL_TABLET | Freq: Every day | ORAL | Status: DC
  Administered 2019-03-12: 16:00:00 40 mg via ORAL
  Filled 2019-03-12: qty 2

## 2019-03-12 MED ORDER — BUDESONIDE 0.25 MG/2ML IN SUSP: 0.2500 mg | Freq: Two times a day (BID) | RESPIRATORY_TRACT | Status: DC

## 2019-03-12 MED ORDER — PREDNISONE 20 MG PO TABS: 20.0000 mg | ORAL_TABLET | Freq: Every day | ORAL | 0 refills | Status: AC

## 2019-03-12 MED ORDER — METHYLPREDNISOLONE SODIUM SUCC 125 MG IJ SOLR
125.0000 mg | Freq: Once | INTRAMUSCULAR | Status: AC
  Administered 2019-03-12: 125 mg via INTRAVENOUS
  Filled 2019-03-12: qty 2

## 2019-03-12 NOTE — Progress Notes (Addendum)
PROGRESS NOTE    Whitney Beard  ZOX:[id number] DOB: 06-Apr-2001 DOA: 03/09/2019 PCP: Brooke Paceurham, Megan, MD   Brief Narrative: 18-year-old female with bipolar disorder, asthma, diabetes mellitus, hypertension with morbid obesity sleep apnea brought to the ER after intentional overdose with multiple medication for suicidal attempt. "Patient took it around 12:10 AM this night. Patient states she took gabapentin 2100 mg metformin 3500 mg Topamax 1400 mg venlafaxine 1050 mg and amlodipine 35 mg." Poison control was contacted and advised monitoring 24 hrs. QTC at 458on admission and now470 stable, qrs stable. normal lactic acid, labs stable She is aa0x4, agreeable w POC for inpatient psych, initially somewhat reluctant. Mental status stable. Patient is medically stable for discharge to inpatient psychiatric facility as per psychiatry recommendation.  Subjective: Overnight was short of breath wheezing, given IV steroids.  This morning was sleeping comfortably woke up, denies any trouble breathing but still have wheezing.  Assessment & Plan:   Principal Problem:   Suicide attempt [facility](HCC) Active Problems:   Overdose, intentional self-harm, initial encounter (HCC)   OSA (obstructive sleep apnea)   Morbid obesity with BMI of 50.0-59.9, adult (HCC)   Drug overdose   Diabetes mellitus type 2 in obese [facility](HCC)  Drug overdose for suicidal intention with abapentin 2100 mg metformin 3500 mg Topamax 1400 mg venlafaxine 1050 mg and amlodipine 35 mg.  Patient is currently medically stable, QTC stable, vitals are stable.  At this time she has been medically stable and cleared for discharge to inpatient psychiatry awaiting placement.  Continue suicide precaution one-to-one.  If the patient refused to go to inpatient psych voluntarily, will need to commit her involuntarily as per psych recommendation due to SI.  HTN:BP is stable stable. Resume amlodipine 5 mg in 1-2 days,if BP starts to go above 140. Rest of her  psychotropic Home meds on hold, assess for need to resume at inpatient psychiatry and will continue to hold.  DM-resume metformin upon discharge.  Blood sugar stable in 160, watch while on steroid  Asthma with wheezing, overnight with shortness of breath, received IV steroid x1.  I will continue oral steroid short taper. Added Pulmicort nebulizer, cont scheduled nebulizer and prn inhalers. Monitor  DVT prophylaxis: SCD Code Status: full Family Communication: patient Disposition Plan: Waiting for inpatient psychiatric facility. Unsafe for disharge home and remains inpateint pending inpatient psych   Consultants:  Psychiatry Procedures: None Antimicrobials: Anti-infectives (From admission, onward)   None       Objective: Vitals:   03/12/19 0612 03/12/19 0831 03/12/19 0857 03/12/19 1204  BP:   125/87 113/60  Pulse:  86 90 (!) 116  Resp:  (!) 23 (!) 25 (!) 29  Temp:   98.7 F (37.1 C) 98.8 F (37.1 C)  TempSrc:   Oral Oral  SpO2:  100%    Weight: (!) 170.8 kg     Height:        Intake/Output Summary (Last 24 hours) at 03/12/2019 1232 Last data filed at 03/12/2019 1038 Gross per 24 hour  Intake 840 ml  Output -  Net 840 ml   Filed Weights   03/10/19 0609 03/11/19 0517 03/12/19 0612  Weight: (!) 173.3 kg (!) 171.1 kg (!) 170.8 kg   Weight change: -0.251 kg  Body mass index is 60.78 kg/m.  Intake/Output from previous day: 04/11 0701 - 04/12 0700 In: 1040 [P.O.:1040] Out: 1 [Urine:1] Intake/Output this shift: Total I/O In: 120 [P.O.:120] Out: -   Examination:  General exam: Calm, comfortable, not in acute distress,  older for age, average built.  HEENT:Oral mucosa moist, Ear/Nose WNL grossly, dentition normal. Respiratory system: Bilateral wheezing present but has good air entry. Cardiovascular system: regular rate and rhythm, S1 & S2 heard, No JVD/murmurs. Gastrointestinal system: Abdomen soft, non-tender, non-distended, BS +. Nervous System:Alert, awake and  oriented at baseline. Able to move UE and LE, sensation intact. Extremities: No edema, distal peripheral pulses palpable.  Skin: No rashes,no icterus. MSK: Normal muscle bulk,tone, power EKG there is no order to the EKG Medications:  Scheduled Meds: . albuterol  2.5 mg Nebulization Q6H  . budesonide  0.25 mg Nebulization BID  . potassium chloride  20 mEq Oral Daily  . predniSONE  40 mg Oral Q breakfast   Continuous Infusions:  Data Reviewed: I have personally reviewed following labs and imaging studies  CBC: Recent Labs  Lab 03/09/19 0056 03/09/19 0527  WBC 10.8* 10.3  NEUTROABS 6.8 5.6  HGB 12.5 11.4*  HCT 38.6 36.2  MCV 93.5 94.5  PLT 364 337   Basic Metabolic Panel: Recent Labs  Lab 03/09/19 0056 03/09/19 0100 03/09/19 0527 03/09/19 1424 03/10/19 0923  NA 136  --  137  --  136  K 4.8  --  4.0 3.7 4.3  CL 107  --  112*  --  108  CO2 21*  --  20*  --  20*  GLUCOSE 98  --  103*  --  96  BUN 12  --  12  --  8  CREATININE 0.92  --  0.77  --  0.82  CALCIUM 8.7*  --  8.2*  --  8.7*  MG  --  1.7 2.3 1.9  --    GFR: Estimated Creatinine Clearance: 182.5 mL/min (by C-G formula based on SCr of 0.82 mg/dL). Liver Function Tests: Recent Labs  Lab 03/09/19 0056 03/09/19 0527 03/10/19 0923  AST 25 16 11*  ALT 15 15 13   ALKPHOS 96 86 90  BILITOT 1.0 0.4 0.4  PROT 7.5 6.7 6.0*  ALBUMIN 3.7 3.4* 3.0*   No results for input(s): LIPASE, AMYLASE in the last 168 hours. No results for input(s): AMMONIA in the last 168 hours. Coagulation Profile: No results for input(s): INR, PROTIME in the last 168 hours. Cardiac Enzymes: No results for input(s): CKTOTAL, CKMB, CKMBINDEX, TROPONINI in the last 168 hours. BNP (last 3 results) No results for input(s): PROBNP in the last 8760 hours. HbA1C: No results for input(s): HGBA1C in the last 72 hours. CBG: Recent Labs  Lab 03/11/19 1113 03/11/19 1556 03/11/19 1955 03/12/19 0713 03/12/19 1111  GLUCAP 96 101* 79 107*  166*   Lipid Profile: No results for input(s): CHOL, HDL, LDLCALC, TRIG, CHOLHDL, LDLDIRECT in the last 72 hours. Thyroid Function Tests: No results for input(s): TSH, T4TOTAL, FREET4, T3FREE, THYROIDAB in the last 72 hours. Anemia Panel: No results for input(s): VITAMINB12, FOLATE, FERRITIN, TIBC, IRON, RETICCTPCT in the last 72 hours. Sepsis Labs: Recent Labs  Lab 03/09/19 0100 03/09/19 0319 03/09/19 0527 03/10/19 0923  LATICACIDVEN 1.7 1.6 1.7 1.3    No results found for this or any previous visit (from the past 240 hour(s)).    Radiology Studies: No results found.    LOS: 1 day   Time spent: More than 50% of that time was spent in counseling and/or coordination of care.  Lanae Boast, MD [facility]  03/12/2019, 12:32 PM

## 2019-03-12 NOTE — Tx Team (Signed)
Initial Treatment Plan 03/12/2019 6:16 PM Whitney Beard [id number]:[id number]    PATIENT STRESSORS: Health problems Marital or family conflict Medication change or noncompliance   PATIENT STRENGTHS: Average or above average intelligence Communication skills General fund of knowledge Motivation for treatment/growth Religious Affiliation Supportive family/friends   PATIENT IDENTIFIED PROBLEMS: Suicide Risk  Coping skills for depression  Healthy communication skills                  DISCHARGE CRITERIA:  Improved stabilization in mood, thinking, and/or behavior Reduction of life-threatening or endangering symptoms to within safe limits Verbal commitment to aftercare and medication compliance  PRELIMINARY DISCHARGE PLAN: Return to previous living arrangement  PATIENT/FAMILY INVOLVEMENT: This treatment plan has been presented to and reviewed with the patient, Whitney Beard.  The patient and family have been given the opportunity to ask questions and make suggestions.  Karren Burly, RN 03/12/2019, 6:16 PM

## 2019-03-12 NOTE — TOC Transition Note (Signed)
Transition of Care [facility]) - CM/SW Discharge Note   Patient Details  Name: Whitney Beard MRN: [id number] Date of Birth: [birth year]  [facility] CM/SW Contact:  Maree Krabbe, LCSW Phone Number: 03/12/2019, 1:44 PM   Clinical Narrative:   Pt has been accepted at [facility]. Pt is voluntary- form has been signed and faxed [phone]). Pt will transport via [facility]. Accepting MD is Dr. Shela Commons. Pt will go to room 604 bed 1. Report can be called (after 2:00) at [phone]. Pick up time for transport is scheduled for 2:00.     Final next level of care: Psychiatric Hospital Barriers to Discharge: No Barriers Identified   Patient Goals and CMS Choice Patient states their goals for this hospitalization and ongoing recovery are:: Return home   Choice offered to / list presented to : Patient  Discharge Placement              Patient chooses bed at: [facility]) Patient to be transferred to facility by: Juel Burrow) Name of family member notified: (pt alert and oriented) Patient and family notified of of transfer: 03/12/19  Discharge Plan and Services In-house Referral: Clinical Social Work Discharge Planning Services: NA Post Acute Care Choice: NA          DME Arranged: N/A DME Agency: NA HH Arranged: NA HH Agency: NA   Social Determinants of Health (SDOH) Interventions     Readmission Risk Interventions Readmission Risk Prevention Plan 03/10/2019  Post Dischage Appt Complete  Medication Screening Complete  Transportation Screening Complete  Some recent data might be hidden
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
## 2019-03-12 NOTE — Clinical Social Work Note (Signed)
CSW spoke with pt regarding bed offers at this time [facility] has a bed available for pt today. Pt is agreeable. Pt signed the voluntary form and form faxed to [place] at [facility].  [place], [place] [phone]

## 2019-03-13 DIAGNOSIS — T50992A Poisoning by other drugs, medicaments and biological substances, intentional self-harm, initial encounter: Secondary | ICD-10-CM

## 2019-03-13 DIAGNOSIS — F418 Other specified anxiety disorders: Secondary | ICD-10-CM

## 2019-03-13 DIAGNOSIS — T43212A Poisoning by selective serotonin and norepinephrine reuptake inhibitors, intentional self-harm, initial encounter: Secondary | ICD-10-CM

## 2019-03-13 DIAGNOSIS — T383X2A Poisoning by insulin and oral hypoglycemic [antidiabetic] drugs, intentional self-harm, initial encounter: Secondary | ICD-10-CM

## 2019-03-13 DIAGNOSIS — F332 Major depressive disorder, recurrent severe without psychotic features: Secondary | ICD-10-CM

## 2019-03-13 MED ORDER — HYDROXYZINE HCL 10 MG PO TABS: 10.0000 mg | ORAL_TABLET | Freq: Three times a day (TID) | ORAL | Status: DC | PRN

## 2019-03-13 MED ORDER — LURASIDONE HCL 20 MG PO TABS
20.0000 mg | ORAL_TABLET | Freq: Every day | ORAL | Status: DC
  Administered 2019-03-14: 20 mg via ORAL
  Filled 2019-03-13 (×4): qty 1
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
## 2019-03-13 NOTE — [facility] Group Notes (Signed)
[facility] Group Notes:  (Nursing/MHT/Case Management/Adjunct)  Date:  03/13/2019  Time:  6:28 AM  Type of Therapy:  Wrap-up  Participation Level:  Active  Participation Quality:  Attentive  Affect:  Excited  Cognitive:  Alert, Appropriate and Oriented  Insight:  Limited  Engagement in Group:  Engaged  Modes of Intervention:  Clarification, Discussion and Support  Summary of Progress/Problems: Patient watched "The Last Lecture." She shared what it meant to her. "No matter what is happening in life,be a [place] not a [place].  Whitney Beard 03/13/2019, 6:28 AM

## 2019-03-13 NOTE — Progress Notes (Signed)
Recreation Therapy Notes   Date: 03/13/19 Time: 1:00 pm Location: 600 hall   Group Topic: Socialization Packet  Goal Area(s) Addresses:  Patient will work on Agricultural consultant. Patient will identify what are characteristics that you cherish in a friend. Patient will follow directions on first prompt. Patient will identify changes they can make to acquire better friends for themselves.   Behavioral Response: Appropriate  Intervention: Socialization and Characteristics of People Packets   Activity: Patients were instructed to be 6 feet apart in the hallway due to the lack of Day Room access. Patients sat and completed packets regarding socialization and characteristics of friends provided. Patients and LRT went over the answer keys to packet. LRT walked through the hall and was available for questions or concerns.   Education: Ability to think creatively, Ability to follow Directions, Change of thought processes Discharge Planning.   Education Outcome: Acknowledges education/In group clarification offered  Clinical Observations/Feedback: . Due to COVID-19, guidelines group was not held. Group members were provided a learning activity packet to work on the topic and above-stated goals. LRT is available to answer any questions patient may have regarding the packet.   Deidre Ala, LRT/CTRS      Kenedy Haisley L Allyn Bartelson 03/13/2019 3:40 PM

## 2019-03-13 NOTE — Tx Team (Signed)
Interdisciplinary Treatment and Diagnostic Plan Update  03/13/2019 Time of Session: 10 AM Roxana Hiresyanna Key MRN: [id number]  Principal Diagnosis: MDD (major depressive disorder), recurrent severe, without psychosis (HCC)  Secondary Diagnoses: Principal Problem:   MDD (major depressive disorder), recurrent severe, without psychosis (HCC) Active Problems:   Overdose, intentional self-harm, initial encounter (HCC)   Anxiety disorder of adolescence   Suicide attempt (HCC)   Current Medications:  Current Facility-Administered Medications  Medication Dose Route Frequency Provider Last Rate Last Dose  . acetaminophen (TYLENOL) tablet 650 mg  650 mg Oral Q6H PRN Money, Gerlene Burdockravis B, FNP      . albuterol (PROVENTIL HFA;VENTOLIN HFA) 108 (90 Base) MCG/ACT inhaler 1 puff  1 puff Inhalation Q6H PRN Money, Gerlene Burdockravis B, FNP   1 puff at 03/12/19 1936  . alum & mag hydroxide-simeth (MAALOX/MYLANTA) 200-200-20 MG/5ML suspension 30 mL  30 mL Oral Q6H PRN Money, Gerlene Burdockravis B, FNP      . hydrOXYzine (ATARAX/VISTARIL) tablet 10 mg  10 mg Oral TID PRN Leata MouseJonnalagadda, Janardhana, MD      . Melene Muller[START ON 03/14/2019] lurasidone (LATUDA) tablet 20 mg  20 mg Oral Q breakfast Leata MouseJonnalagadda, Janardhana, MD      . magnesium hydroxide (MILK OF MAGNESIA) suspension 30 mL  30 mL Oral QHS PRN Money, Feliz Beamravis B, FNP      . metFORMIN (GLUCOPHAGE) tablet 500 mg  500 mg Oral Q breakfast Money, Gerlene Burdockravis B, FNP   500 mg at 03/13/19 0915  . predniSONE (DELTASONE) tablet 20 mg  20 mg Oral Q breakfast Money, Gerlene Burdockravis B, FNP   20 mg at 03/13/19 0915   PTA Medications: Medications Prior to Admission  Medication Sig Dispense Refill Last Dose  . albuterol (PROVENTIL HFA;VENTOLIN HFA) 108 (90 Base) MCG/ACT inhaler Inhale 2 puffs into the lungs every 4 (four) hours as needed. For wheeze or shortness of breath (Patient taking differently: Inhale 2 puffs into the lungs every 4 (four) hours as needed for wheezing or shortness of breath. ) 2 Inhaler 0 Past Month at  Unknown time  . albuterol (PROVENTIL) (2.5 MG/3ML) 0.083% nebulizer solution Take 3 mLs (2.5 mg total) by nebulization every 6 (six) hours as needed for wheezing or shortness of breath. 75 mL 0 Past Month at Unknown time  . beclomethasone (QVAR) 80 MCG/ACT inhaler Inhale 2 puffs into the lungs 2 (two) times daily. 1 Inhaler 12 Past Month at Unknown time  . hydrOXYzine (ATARAX/VISTARIL) 10 MG tablet Take 1 tablet (10 mg total) by mouth 3 (three) times daily as needed for anxiety. 30 tablet 0 Past Month at Unknown time  . lurasidone (LATUDA) 20 MG TABS tablet Take 1 tablet (20 mg total) by mouth daily with breakfast. 30 tablet 0 Past Month at Unknown time  . metFORMIN (GLUCOPHAGE) 500 MG tablet Take 1 tablet (500 mg total) by mouth daily with breakfast. 30 tablet 0 Past Month at Unknown time  . montelukast (SINGULAIR) 10 MG tablet Take 10 mg by mouth at bedtime.   Past Month at Unknown time  . predniSONE (DELTASONE) 20 MG tablet Take 1 tablet (20 mg total) by mouth daily with breakfast for 4 days. 4 tablet 0     Patient Stressors: Health problems Marital or family conflict Medication change or noncompliance  Patient Strengths: Average or above average intelligence Communication skills General fund of knowledge Motivation for treatment/growth Religious Affiliation Supportive family/friends  Treatment Modalities: Medication Management, Group therapy, Case management,  1 to 1 session with clinician, Psychoeducation, Recreational therapy.  Physician Treatment Plan for Primary Diagnosis: MDD (major depressive disorder), recurrent severe, without psychosis (HCC) Long Term Goal(s): Improvement in symptoms so as ready for discharge Improvement in symptoms so as ready for discharge   Short Term Goals: Ability to identify changes in lifestyle to reduce recurrence of condition will improve Ability to verbalize feelings will improve Ability to disclose and discuss suicidal ideas Ability to  demonstrate self-control will improve Ability to identify and develop effective coping behaviors will improve Ability to maintain clinical measurements within normal limits will improve Compliance with prescribed medications will improve Ability to identify triggers associated with substance abuse/mental health issues will improve  Medication Management: Evaluate patient's response, side effects, and tolerance of medication regimen.  Therapeutic Interventions: 1 to 1 sessions, Unit Group sessions and Medication administration.  Evaluation of Outcomes: Progressing  Physician Treatment Plan for Secondary Diagnosis: Principal Problem:   MDD (major depressive disorder), recurrent severe, without psychosis (HCC) Active Problems:   Overdose, intentional self-harm, initial encounter (HCC)   Anxiety disorder of adolescence   Suicide attempt (HCC)  Long Term Goal(s): Improvement in symptoms so as ready for discharge Improvement in symptoms so as ready for discharge   Short Term Goals: Ability to identify changes in lifestyle to reduce recurrence of condition will improve Ability to verbalize feelings will improve Ability to disclose and discuss suicidal ideas Ability to demonstrate self-control will improve Ability to identify and develop effective coping behaviors will improve Ability to maintain clinical measurements within normal limits will improve Compliance with prescribed medications will improve Ability to identify triggers associated with substance abuse/mental health issues will improve     Medication Management: Evaluate patient's response, side effects, and tolerance of medication regimen.  Therapeutic Interventions: 1 to 1 sessions, Unit Group sessions and Medication administration.  Evaluation of Outcomes: Progressing   RN Treatment Plan for Primary Diagnosis: MDD (major depressive disorder), recurrent severe, without psychosis (HCC) Long Term Goal(s): Knowledge of disease  and therapeutic regimen to maintain health will improve  Short Term Goals: Ability to remain free from injury will improve, Ability to verbalize frustration and anger appropriately will improve, Ability to demonstrate self-control, Ability to participate in decision making will improve, Ability to identify and develop effective coping behaviors will improve and Compliance with prescribed medications will improve  Medication Management: RN will administer medications as ordered by provider, will assess and evaluate patient's response and provide education to patient for prescribed medication. RN will report any adverse and/or side effects to prescribing provider.  Therapeutic Interventions: 1 on 1 counseling sessions, Psychoeducation, Medication administration, Evaluate responses to treatment, Monitor vital signs and CBGs as ordered, Perform/monitor CIWA, COWS, AIMS and Fall Risk screenings as ordered, Perform wound care treatments as ordered.  Evaluation of Outcomes: Progressing   LCSW Treatment Plan for Primary Diagnosis: MDD (major depressive disorder), recurrent severe, without psychosis (HCC) Long Term Goal(s): Safe transition to appropriate next level of care at discharge, Engage patient in therapeutic group addressing interpersonal concerns.  Short Term Goals: Engage patient in aftercare planning with referrals and resources, Increase social support, Increase ability to appropriately verbalize feelings, Increase emotional regulation and Increase skills for wellness and recovery  Therapeutic Interventions: Assess for all discharge needs, 1 to 1 time with Social worker, Explore available resources and support systems, Assess for adequacy in community support network, Educate family and significant other(s) on suicide prevention, Complete Psychosocial Assessment, Interpersonal group therapy.  Evaluation of Outcomes: Progressing   Progress in Treatment: Attending groups: Yes. Participating  in  groups: Yes. Taking medication as prescribed: Yes. Toleration medication: Yes. Family/Significant other contact made: No, will contact:  Pt is 17 therefore, the PSA will be completed with her. Pt did give verbal consent during the treatment team meeting for staff to speak with mother about her care here Patient understands diagnosis: Yes. Discussing patient identified problems/goals with staff: Yes. Medical problems stabilized or resolved: Yes. Denies suicidal/homicidal ideation: As evidenced by:  Contracts for safety on the unit Issues/concerns per patient self-inventory: No. Other: None Reported   New problem(s) identified: No, Describe:  None Reported  New Short Term/Long Term Goal(s):Safe transition to appropriate next level of care at discharge, Engage patient in therapeutic group addressing interpersonal concerns.   Short Term Goals: Engage patient in aftercare planning with referrals and resources, Increase ability to appropriately verbalize feelings, Increase emotional regulation and Increase skills for wellness and recovery  Patient Goals: "Finding somewhere that is not my home to live. I want to live with my god mother, I have talked to her about this."   Discharge Plan or Barriers: At this time, pt will return to parent/guardian care and follow up with outpatient therapy and medication management services with [facility].   Reason for Continuation of Hospitalization: Depression Medication stabilization Suicidal ideation  Estimated Length of Stay:03/17/19  Attendees: Patient:Annastacia Dong  03/13/2019 4:14 PM  Physician: Dr. Elsie Saas 03/13/2019 4:14 PM  Nursing: Ok Edwards, RN 03/13/2019 4:14 PM  RN Care Manager: 03/13/2019 4:14 PM  Social Worker: Karin Lieu Kaysey Berndt, LCSWA 03/13/2019 4:14 PM  Recreational Therapist:  03/13/2019 4:14 PM  Other:  03/13/2019 4:14 PM  Other:  03/13/2019 4:14 PM  Other: 03/13/2019 4:14 PM    Scribe for Treatment  Team: Marik Sedore S Harlo Fabela, LCSWA 03/13/2019 4:14 PM   Bellanie Matthew S. Yessenia Maillet, LCSWA, MSW [facility]: [facility]  [phone]
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
## 2019-03-13 NOTE — Plan of Care (Addendum)
Whitney Beard is interacting well with her peers. She is positive for her participation in group. Whitney Beard identifies conflict with her mother being primary stressor. She reports mother poor support . 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 Whitney Beard reports she has not been on her medications at home because she has not had follow up with her doctors. Required Albuterol inhaler for relief of wheezing.
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
## 2019-03-13 NOTE — Progress Notes (Signed)
Nursing Note: 0700-1900  D:  Pt presents with depressed mood and sad affect but becomes silly and animated at times.  Pt states that she does not feel that her mother loves her and that she is always making mistakes. She wants to go live with her God-mother but in the past her mother will not allow this. Pt stated today "She told me that my 19 yr old brother will miss me, now I can't leave the house.  I love him and care for him during the day." Goal for today: Write a letter to my mother.  Pt had a good visit with her mother tonight. Mother shared that the pt hasn't taken her medication in 2-3 weeks.  "When she is taking her medicine, I can de-escalate her. When she is not taking her medication, I cannot."  A:  Encouraged to verbalize needs and concerns, active listening and support provided.  Continued Q 15 minute safety checks.  Observed active participation in group settings.  R:  Pt. Is cooperative and supportive to peers in milieu.  Denies A/V hallucinations and is able to verbally contract for safety.

## 2019-03-14 DIAGNOSIS — Z7984 Long term (current) use of oral hypoglycemic drugs: Secondary | ICD-10-CM

## 2019-03-14 DIAGNOSIS — E119 Type 2 diabetes mellitus without complications: Secondary | ICD-10-CM

## 2019-03-14 DIAGNOSIS — Z79899 Other long term (current) drug therapy: Secondary | ICD-10-CM

## 2019-03-14 MED ORDER — LURASIDONE HCL 20 MG PO TABS
40.0000 mg | ORAL_TABLET | Freq: Every day | ORAL | Status: DC
  Administered 2019-03-15 – 2019-03-17 (×3): 40 mg via ORAL
  Filled 2019-03-14 (×6): qty 2

## 2019-03-14 NOTE — Progress Notes (Signed)
[place] NOVEL CORONAVIRUS (COVID-19) DAILY CHECK-OFF SYMPTOMS - answer yes or no to each - every day NO YES  Have you had a fever in the past 24 hours?  . Fever (Temp > 37.80C / 100F) x   Have you had any of these symptoms in the past 24 hours? . New Cough .  Sore Throat  .  Shortness of Breath .  Difficulty Breathing .  Unexplained Body Aches   x   Have you had any one of these symptoms in the past 24 hours not related to allergies?   . Runny Nose .  Nasal Congestion .  Sneezing   x   If you have had runny nose, nasal congestion, sneezing in the past 24 hours, has it worsened?  x   EXPOSURES - check yes or no    Have you traveled outside the state in the past 14 days?  x   Have you been in contact with someone with a confirmed diagnosis of COVID-19 or PUI in the past 14 days without wearing appropriate PPE?  x   Have you been living in the same home as a person with confirmed diagnosis of COVID-19 or a PUI (household contact)?    x   Have you been diagnosed with COVID-19?    x              What to do next: Answered NO to all: Answered YES to anything:   Proceed with unit schedule Follow the [facility] Inpatient Flowsheet.

## 2019-03-14 NOTE — [facility] Counselor (Signed)
CSW met with pt to complete PSA individually as she is 18 years old. Pt provided verbal consent to speak with mother. She will continue to follow up with [facility] for therapy and medication management. Pt is scheduled to discharge on 03/17/19.   Jakeim Sedore S. [place], [place], MSW [facility]: Child and Adolescent  [phone]
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
## 2019-03-14 NOTE — Progress Notes (Signed)
Patient attended the evening group session and answered all discussion questions prompted from this Clinical research associate. Patient shared her goal for the day was to come up with a bucket list. Patient rated her day a 4 out of 10 and her affect was appropriate.

## 2019-03-14 NOTE — BHH Group Notes (Signed)
LCSW Group Therapy Note 03/14/2019 2:45pm  Type of Therapy and Topic:  Group Therapy:  Communication  Participation Level:  Active  Description of Group: Patients will identify how individuals communicate with one another appropriately and inappropriately.  Patients will be guided to discuss their thoughts, feelings and behaviors related to barriers when communicating.  The group will process together ways to execute positive and appropriate communication with attention given to how one uses behavior, tone and body language.  Patients will be encouraged to reflect on a situation where they were successfully able to communicate and what made this example successful.  Group will identify specific changes they are motivated to make in order to overcome communication barriers with self, peers, authority, and parents.  This group will be process-oriented with patients participating in exploration of their own experiences, giving and receiving support, and challenging self and other group members.   Therapeutic Goals 1. Patient will identify how people communicate (body language, facial expression, and electronics).  Group will also discuss tone, voice and how these impact what is communicated and what is received. 2. Patient will identify feelings (such as fear or worry), thought process and behaviors related to why people internalize feelings rather than express self openly. 3. Patient will identify two changes they are willing to make to overcome communication barriers 4. Members will then practice through role play how to communicate using I statements, I feel statements, and acknowledging feelings rather than displacing feelings on others  Summary of Patient Progress: Pt presents with appropriate mood and affect. Pt shared two factors that make it difficult for others to communicate with her. "I shut down because if I feel upset or like I'm not being heard then I don't talk. If I get mad, I just get  up." Feelings or thoughts that cause her to internalize emotions rather than openly express them are "I don't want to be a burden to my mom. I am not her sole child. I'm the person who is there for everyone else.' Two changes she is willing to make to overcome communication barriers are "try to not shutdown when I'm upset and talk about what is upsetting me. I can try harder, not give up and see the other person's view." These changes will improve her mental health by "trying to talk about my problems and what is hurting me."   Therapeutic Modalities Cognitive Behavioral Therapy Motivational Interviewing Solution Focused Therapy  Akya Fiorello S  Jon, LCSWA 03/14/2019 4:21 PM   Kalden Wanke S. Monicka Cyran, LCSWA, MSW [facility]: Child and Adolescent  [phone]

## 2019-03-14 NOTE — Progress Notes (Signed)
Recreation Therapy Notes   Date: 03/14/2019 Time: 1:15-2:30 pm Location: 600 hall    Group Topic: Psychoeducational Group    Goal Area(s) Addresses:  Patient will participate in group conversation.  Patient will participate in drawing or writing for group conversation.  Patient will follow directions on first prompt.   Behavioral Response: appropriate   Intervention: Psychoeducational Group; conversation, drawing, and writing   Activity: Patients and LRT started with an ice breaker to get to know each other. Patients were to share their name, age, and a fun fact about each other. Next patients were instructed to think about society, and what the perfect person looks like in their eyes. The patients were to write down or draw characteristics of the perfect person for their community; a person who fits into the standards set by the people they surround themselves with. Patients then shared their responses and talked about how everyone's were differing.  Patients then were given a certain piece of paper that had different parts that make up a person(personality, values, facial features, clothes, legs, arms, skin color etc.) and had to draw whatever they got. At the end the LRT taped all of their papers up on a large sheet, and drew the picture to combine all of their parts.  Patient and LRT talked about how no one wants to look like the "ideal society person" that they put together. They were debriefed on the importance of not worrying about fitting into society and working on better parts of themselves that they cherish.   Education:  Leisure Education, Building control surveyor   Education Outcome: Acknowledges education    NOTES:  Patient stated in her family, religion and efforts to succeed are important. Patient stated her family uses religion as a pull to make people believe a certain way in the family.   Deidre Ala, LRT/CTRS        Jon Kasparek L Salia Cangemi 03/14/2019 4:55 PM
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
## 2019-03-14 NOTE — Progress Notes (Signed)
Patient ID: Whitney Beard, female   DOB: June 14, 2001, 18 y.o.   MRN: [id number] D) Pt has been appropriate and cooperative on approach. Pt brightens on approach and laughs and smiles easily. Superficial and minimizing. Positive for groups, activities with minimal prompting. Pt actively participates in groups without prompting. Pt rates her day a 6/10 with "poor" sleep c/o recurring "night terrors". Appetite is "improving" she says. Food intake and appetite have been observed to be robust. Pt is working on identifying a "bucket list" of things to look forward to and live for. Pt contracts for safety. Pt c/o sob relieved by inhaler. A) Level 3 obs for safety, support and encouragement provided. 1:1 support provided. Med ed reinforced. Contract for safety. R) Cooperative.

## 2019-03-15 NOTE — Progress Notes (Signed)
Child/Adolescent Psychoeducational Group Note  Date:  03/15/2019 Time:  9:14 PM  Group Topic/Focus:  Wrap-Up Group:   The focus of this group is to help patients review their daily goal of treatment and discuss progress on daily workbooks.  Participation Level:  Active  Participation Quality:  Appropriate  Affect:  Appropriate  Cognitive:  Appropriate  Insight:  Appropriate  Engagement in Group:  Engaged  Modes of Intervention:  Discussion, Socialization and Support  Additional Comments:  Pt attended and engaged in wrap up group. Her goal for today was to work on I statements. She admitted that her frustration gets the best of her and her mom gave her false hope. Something positive that happened today is that she won [employer]. Tomorrow, she wants to work on managing disappointment. She rated her day a 3/10. (mom broke promise and it ruined her day).  Whitney Beard Whitney Beard 03/15/2019, 9:14 PM

## 2019-03-15 NOTE — Progress Notes (Signed)
Patient ID: Whitney Beard, female   DOB: 02/11/2001, 18 y.o.   MRN: [id number] D) Pt has been bright, appropriate and cooperative on approach. Pt is active in the milieu and has been positive for all groups and activities. Pt rates her day a 4/10 with sleep "fair" and appetite "improving". Pt is able to verbalize appropriate coping skills and provide support to peers. Pt is working on using "I statements" more when communicating with her mother. Pt denies physical c/o other than being SOB related to chronic asthma. Pt denies any thoughts of self harm and vernally contracts for safety. A) Level 3 obs for safety. Support and encourage. Med ed reinforced. Inhaler prn. R) Receptive and cooperative.

## 2019-03-15 NOTE — B[facility] Counselor (Signed)
CSW called and spoke with pt's mother, Laverle Patter regarding discharge and SPE. During SPE, mother verbalized understanding and will make necessary changes. Mother will pick pt up at 10:30 AM on 03/17/19 for discharge.   Tagen Brethauer S. Kearah Gayden, LCSWA, MSW [facility]: Child and Adolescent  [phone]

## 2019-03-15 NOTE — Progress Notes (Signed)
Recreation Therapy Notes  Date: 03/15/2019 Time: 1:00- 2:30 pm Location: 600 hall   Group Topic: Coping Skills   Goal Area(s) Addresses:  Patient will successfully identify what a coping skill is. Patient will successfully identify coping skills they can use post d/c.  Patient will successfully identify benefit of using coping skills post d/c.  Behavioral Response: appropriate   Intervention: Coping skills   Activity: Patients and Lrt had a group discussion on what a coping skill is, and examples of coping skills. Patients were then allowed to work in groups and come up with a coping skill for every letter of the alphabet. Patients were given a worksheet called "Coping A to Z" to fill out. Patients worked together to complete this and the group shared their answers as a whole. Patients were given a list of "49 Coping Skills" on their way out of the door. Patients were also provided a list of different coping skills that was printed and categorized A to Z, much like their activity.   Education: Pharmacologist, Building control surveyor.   Education Outcome: Acknowledges education  Clinical Observations/Feedback: Patient stated her coping skill was to "bake goods". Patient was the most dedicated and hard working out of her peers. Patient appeared willing to hep her peers when they needed assistance filling out their worksheet. Patient was polite, bubbly, but superficial in conversation.   Deidre Ala, LRT/CTRS         Carrera Kiesel L Almina Schul 03/15/2019 3:31 PM

## 2019-03-15 NOTE — [facility] Group Notes (Signed)
[facility] LCSW Group Therapy Note  Date/Time:  03/15/2019 2:25PM   Type of Therapy and Topic:  Group Therapy:  Overcoming Obstacles  Participation Level:  Active  Description of Group:    In this group patients will be encouraged to explore what they see as obstacles to their own wellness and recovery. They will be guided to discuss their thoughts, feelings, and behaviors related to these obstacles. The group will process together ways to cope with barriers, with attention given to specific choices patients can make. Each patient will be challenged to identify changes they are motivated to make in order to overcome their obstacles. This group will be process-oriented, with patients participating in exploration of their own experiences as well as giving and receiving support and challenge from other group members.  Therapeutic Goals: 1. Patient will identify personal and current obstacles as they relate to admission. 2. Patient will identify barriers that currently interfere with their wellness or overcoming obstacles.  3. Patient will identify feelings, thought process and behaviors related to these barriers. 4. Patient will identify two changes they are willing to make to overcome these obstacles:    Summary of Patient Progress Group members participated in this activity by defining obstacles and exploring feelings related to obstacles. Group members discussed examples of positive and negative obstacles. Group members identified the obstacle they feel most related to their admission and processed what they could do to overcome and what motivates them to accomplish this goal.   Patient actively participated in group and her affect was appropriate. She identified her biggest obstacle she dealt with prior to this hospitalization as her family. She identified a barrier to getting over her obstacle is sometimes her family doesn't always let her walk away and they follow her. She stated she can  remind herself that she doesn't want to come back to the hospital. Two changes she identified that she can make are to "walk away when they trigger me and try to talk it out with the person."  Therapeutic Modalities:   Cognitive Behavioral Therapy Solution Focused Therapy Motivational Interviewing Relapse Prevention Therapy   Roselyn Bering, MSW, LCSW Clinical Social Work

## 2019-03-15 NOTE — Progress Notes (Signed)
[facility] MD Progress Note  03/15/2019 11:38 AM Whitney Beard  MRN:  [id number] Subjective: "I am working with the staff who asked me to write down bucket list of the things that I want to do and I wrote down 18 things to do and also working on getting better with the new coping skills."    Patient seen by this MD, chart reviewed and case discussed with treatment team.  In brief:  Whitney Beard is a 18 years old female with a history of bipolar disorder, ADHD asthma diabetes mellitus and morbid obesity admitted for suicidal attempt with multiple psychotropic medication after had an argument with her mother who hurt her emotionally.  On evaluation today: Patient appeared better mood and less worried at this same time she continued to be contemplating about placing herself at mother's home versus her godmother's home because her godmother has a plans about relocating to [place] in 2 months time.  She stated she does not want to move back and forth at the same time she is not getting along with her mother who has been mean to her and making hurtful statements about her shape and her relationships.  Patient mother was not able to come and visit her yesterday but that she is able to talk with her mother briefly and could not continue because mother has to go to take care of her job.  Patient has been actively participating in group therapeutic activities and milieu.  Patient reported she developed goals to work on during this hospitalization and also after leaving from the hospital.  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  Patient endorsed depression 4 out of 10, anxiety 1 out of 10 and anger 0 out of 10.  10 being the worst symptom. Patient denies current suicidal/homicidal ideation, intention or plans.  The patient has no reported irritability, agitation or aggressive behavior.  Patient has been sleeping and eating  well without any difficulties.  Patient has been taking medication, tolerating well without side effects of the medication including GI upset or mood activation.  Patient is able to tolerate her increased dose of Latuda 40 mg daily with breakfast first dose today.  Principal Problem: MDD (major depressive disorder), recurrent severe, without psychosis (HCC) Diagnosis: Principal Problem:   MDD (major depressive disorder), recurrent severe, without psychosis (HCC) Active Problems:   Overdose, intentional self-harm, initial encounter (HCC)   Anxiety disorder of adolescence   Suicide attempt [facility])  Total Time spent with patient: 30 minutes  Past Psychiatric History: Patient has multiple acute psychiatric [facility], #20 16 January 2018 and current and also had [facility] past.  Patient has a history of multiple intentional overdoses in the past.  Past Medical History:  Past Medical History:  Diagnosis Date  . ADHD (attention deficit hyperactivity disorder)   . Allergic rhinitis   . Asthma   . Bipolar 1 disorder (HCC)   . Diabetes mellitus without complication (HCC)    states today 03/09/16 "was told in the past that she was borderline diabetic"  . Obesity   . Sleep apnea   . Suicide attempt by drug ingestion [facility])     Past Surgical History:  Procedure Laterality Date  . ADENOIDECTOMY    . TONSILLECTOMY     Family History:  Family History  Problem Relation Age of  Onset  . Bipolar disorder Father   . Schizophrenia Father   . Bipolar disorder Maternal Grandmother   . SIDS Maternal Aunt   . Heart disease Paternal Grandmother    Family Psychiatric  History: Bipolar in biological father and 8 years old sister and maternal grandmother, depressions 61 years old sister and ODD brother and schizophrenia in paternal uncle. Social History:  Social History   Substance and Sexual Activity  Alcohol Use No      Social History   Substance and Sexual Activity  Drug Use No    Social History   Socioeconomic History  . Marital status: Single    Spouse name: Not on file  . Number of children: Not on file  . Years of education: Not on file  . Highest education level: Not on file  Occupational History  . Not on file  Social Needs  . Financial resource strain: Not on file  . Food insecurity:    Worry: Not on file    Inability: Not on file  . Transportation needs:    Medical: Not on file    Non-medical: Not on file  Tobacco Use  . Smoking status: Never Smoker  . Smokeless tobacco: Never Used  Substance and Sexual Activity  . Alcohol use: No  . Drug use: No  . Sexual activity: Never    Birth control/protection: None  Lifestyle  . Physical activity:    Days per week: Not on file    Minutes per session: Not on file  . Stress: Not on file  Relationships  . Social connections:    Talks on phone: Not on file    Gets together: Not on file    Attends religious service: Not on file    Active member of club or organization: Not on file    Attends meetings of clubs or organizations: Not on file    Relationship status: Not on file  Other Topics Concern  . Not on file  Social History Narrative   Lives with Mom, [place], 6 siblings and Grandmother.   Additional Social History:      Sleep: Fair  Appetite:  Fair -improving  Current Medications: Current Facility-Administered Medications  Medication Dose Route Frequency Provider Last Rate Last Dose  . acetaminophen (TYLENOL) tablet 650 mg  650 mg Oral Q6H PRN Money, Gerlene Burdock, FNP      . albuterol (PROVENTIL HFA;VENTOLIN HFA) 108 (90 Base) MCG/ACT inhaler 1 puff  1 puff Inhalation Q6H PRN Money, Gerlene Burdock, FNP   1 puff at 03/14/19 2156  . alum & mag hydroxide-simeth (MAALOX/MYLANTA) 200-200-20 MG/5ML suspension 30 mL  30 mL Oral Q6H PRN Money, Gerlene Burdock, FNP      . hydrOXYzine (ATARAX/VISTARIL) tablet 10 mg  10 mg Oral TID PRN Leata Mouse, MD      . lurasidone (LATUDA) tablet 40 mg  40 mg Oral Q breakfast Leata Mouse, MD   40 mg at 03/15/19 0801  . magnesium hydroxide (MILK OF MAGNESIA) suspension 30 mL  30 mL Oral QHS PRN Money, Feliz Beam B, FNP      . metFORMIN (GLUCOPHAGE) tablet 500 mg  500 mg Oral Q breakfast Money, Gerlene Burdock, FNP   500 mg at 03/15/19 0801  . predniSONE (DELTASONE) tablet 20 mg  20 mg Oral Q breakfast Money, Gerlene Burdock, FNP   20 mg at 03/15/19 9604    Lab Results: No results found for this or any previous visit (from the past 48 hour(s)).  Blood Alcohol level:  Lab Results  Component Value Date   ETH <10 03/09/2019   ETH <10 01/12/2018    Metabolic Disorder Labs: Lab Results  Component Value Date   HGBA1C 5.3 12/27/2018   MPG 105.41 12/27/2018   MPG 99.67 01/15/2018   No results found for: PROLACTIN Lab Results  Component Value Date   CHOL 165 12/27/2018   TRIG 56 12/27/2018   HDL 36 (L) 12/27/2018   CHOLHDL 4.6 12/27/2018   VLDL 11 12/27/2018   LDLCALC 118 (H) 12/27/2018   LDLCALC 107 (H) 01/15/2018    Physical Findings: AIMS: Facial and Oral Movements Muscles of Facial Expression: None, normal Lips and Perioral Area: None, normal Jaw: None, normal Tongue: None, normal,Extremity Movements Upper (arms, wrists, hands, fingers): None, normal Lower (legs, knees, ankles, toes): None, normal, Trunk Movements Neck, shoulders, hips: None, normal, Overall Severity Severity of abnormal movements (highest score from questions above): None, normal Incapacitation due to abnormal movements: None, normal Patient's awareness of abnormal movements (rate only patient's report): No Awareness, Dental Status Current problems with teeth and/or dentures?: No Does patient usually wear dentures?: No  CIWA:  CIWA-Ar Total: 1 COWS:  COWS Total Score: 0  Musculoskeletal: Strength & Muscle Tone: within normal limits Gait & Station: normal Patient leans: N/A  Psychiatric Specialty  Exam: Physical Exam  ROS  Blood pressure (!) 132/91, pulse 77, temperature 98.2 F (36.8 C), temperature source Oral, resp. rate 18, height 5' 8.11" (1.73 m), weight (!) 171.1 kg, last menstrual period 03/12/2019, SpO2 100 %.Body mass index is 57.15 kg/m.  General Appearance: Casual  Eye Contact:  Good  Speech:  Clear and Coherent and Slow  Volume:  Normal  Mood:  Anxious and Depressed -feeling calm and better  Affect:  Constricted and Depressed -bright on approach  Thought Process:  Coherent, Goal Directed and Descriptions of Associations: Intact  Orientation:  Full (Time, Place, and Person)  Thought Content:  Rumination  Suicidal Thoughts:  No, denied today  Homicidal Thoughts:  No  Memory:  Immediate;   Fair Recent;   Fair Remote;   Fair  Judgement:  Intact  Insight:  Fair  Psychomotor Activity:  Normal  Concentration:  Concentration: Fair and Attention Span: Fair  Recall:  Good  Fund of Knowledge:  Good  Language:  Good  Akathisia:  Negative  Handed:  Right  AIMS (if indicated):     Assets:  Communication Skills Desire for Improvement Financial Resources/Insurance Housing Leisure Time Physical Health Resilience Social Support Talents/Skills Transportation Vocational/Educational  ADL's:  Intact  Cognition:  WNL  Sleep:        Treatment Plan Summary: Daily contact with patient to assess and evaluate symptoms and progress in treatment and Medication management 1. Will maintain Q 15 minutes observation for safety. Estimated LOS: 5-7 days 2. Labs reviewed: CMP normal except calcium 8.7, albumin 30, AST 11 and total protein 6.0, lactic acid 1.3, CBC with differential-RBC 3.83 and hemoglobin 11.4 and hematocrit is 36.2 and platelets 337, acetaminophen-negative and urine tox screen negative for drugs of abuse. 3. Patient will participate in group, milieu, and family therapy. Psychotherapy: Social and Doctor, hospital, anti-bullying, learning based  strategies, cognitive behavioral, and family object relations individuation separation intervention psychotherapies can be considered.  4. Bipolar depression: not improving; monitor response to Lurasidone 40 mg daily with breakfast starting March 15, 2019  5. Anxiety and insomnia: Continue Hydroxyzine 10 mg 3 times daily as needed for anxiety insomnia 6. Diabetes mellitus: Continue  Metformin 500 mg daily with breakfast 7. Prednisone 20 mg daily with breakfast for asthma and albuterol inhaler every 6 hours as needed for wheezing and shortness of breath as per PCP 8. Will continue to monitor patient's mood and behavior. 9. Social Work will schedule a Family meeting to obtain collateral information and discuss discharge and follow up plan. 10. Discharge concerns will also be addressed: Safety, stabilization, and access to medication. 11. Expected date of discharge March 17, 2019  Leata MouseJonnalagadda Rosemaria Inabinet, MD 03/15/2019, 11:38 AM

## 2019-03-15 NOTE — [facility] Suicide Risk Assessment (Signed)
[facility] INPATIENT:  Family/Significant Other Suicide Prevention Education  Suicide Prevention Education:  Education Completed with Mother- Laverle Patter has been identified by the patient as the family member/significant other with whom the patient will be residing, and identified as the person(s) who will aid the patient in the event of a mental health crisis (suicidal ideations/suicide attempt).  With written consent from the patient, the family member/significant other has been provided the following suicide prevention education, prior to the and/or following the discharge of the patient.  The suicide prevention education provided includes the following:  Suicide risk factors  Suicide prevention and interventions  National Suicide Hotline telephone number  [facility] assessment telephone number  [facility] Emergency Assistance 911  [facility] and/or Residential Mobile Crisis Unit telephone number  Request made of family/significant other to:  Remove weapons (e.g., guns, rifles, knives), all items previously/currently identified as safety concern.    Remove drugs/medications (over-the-counter, prescriptions, illicit drugs), all items previously/currently identified as a safety concern.  The family member/significant other verbalizes understanding of the suicide prevention education information provided.  The family member/significant other agrees to remove the items of safety concern listed above.  Rozanna Cormany S Emit Kuenzel 03/15/2019, 11:09 AM   Tobey Schmelzle S. Anaiyah Anglemyer, LCSWA, MSW [facility]: Child and Adolescent  [phone]

## 2019-03-16 DIAGNOSIS — J45909 Unspecified asthma, uncomplicated: Secondary | ICD-10-CM

## 2019-03-16 MED ORDER — LURASIDONE HCL 20 MG PO TABS: 40.0000 mg | ORAL_TABLET | Freq: Every day | ORAL | 1 refills | Status: DC

## 2019-03-16 NOTE — Progress Notes (Signed)
[place] NOVEL CORONAVIRUS (COVID-19) DAILY CHECK-OFF SYMPTOMS - answer yes or no to each - every day NO YES  Have you had a fever in the past 24 hours?  . Fever (Temp > 37.80C / 100F) X   Have you had any of these symptoms in the past 24 hours? . New Cough .  Sore Throat  .  Shortness of Breath .  Difficulty Breathing .  Unexplained Body Aches   X   Have you had any one of these symptoms in the past 24 hours not related to allergies?   . Runny Nose .  Nasal Congestion .  Sneezing   X   If you have had runny nose, nasal congestion, sneezing in the past 24 hours, has it worsened?  X   EXPOSURES - check yes or no X   Have you traveled outside the state in the past 14 days?  X   Have you been in contact with someone with a confirmed diagnosis of COVID-19 or PUI in the past 14 days without wearing appropriate PPE?  X   Have you been living in the same home as a person with confirmed diagnosis of COVID-19 or a PUI (household contact)?    X   Have you been diagnosed with COVID-19?    X              What to do next: Answered NO to all: Answered YES to anything:   Proceed with unit schedule Follow the [facility] Inpatient Flowsheet.   

## 2019-03-16 NOTE — [facility] Suicide Risk Assessment (Signed)
[facility] Discharge Suicide Risk Assessment   Principal Problem: MDD (major depressive disorder), recurrent severe, without psychosis (HCC) Discharge Diagnoses: Principal Problem:   MDD (major depressive disorder), recurrent severe, without psychosis (HCC) Active Problems:   Overdose, intentional self-harm, initial encounter (HCC)   Anxiety disorder of adolescence   Suicide attempt (HCC)   Total Time spent with patient: 15 minutes  Musculoskeletal: Strength & Muscle Tone: within normal limits Gait & Station: normal Patient leans: N/A  Psychiatric Specialty Exam: ROS  Blood pressure (!) 141/88, pulse 60, temperature (!) 97.5 F (36.4 C), temperature source Oral, resp. rate 14, height 5' 8.11" (1.73 m), weight (!) 171.1 kg, last menstrual period 03/12/2019, SpO2 100 %.Body mass index is 57.15 kg/m.  General Appearance: Fairly Groomed  Patent attorney::  Good  Speech:  Clear and Coherent, normal rate  Volume:  Normal  Mood:  Euthymic  Affect:  Full Range  Thought Process:  Goal Directed, Intact, Linear and Logical  Orientation:  Full (Time, Place, and Person)  Thought Content:  Denies any A/VH, no delusions elicited, no preoccupations or ruminations  Suicidal Thoughts:  No  Homicidal Thoughts:  No  Memory:  good  Judgement:  Fair  Insight:  Present  Psychomotor Activity:  Normal  Concentration:  Fair  Recall:  Good  Fund of Knowledge:Fair  Language: Good  Akathisia:  No  Handed:  Right  AIMS (if indicated):     Assets:  Communication Skills Desire for Improvement Financial Resources/Insurance Housing Physical Health Resilience Social Support Vocational/Educational  ADL's:  Intact  Cognition: WNL     Mental Status Per Nursing Assessment::   On Admission:  Suicidal ideation indicated by patient, Suicide plan, Plan includes specific time, place, or method, Intention to act on suicide plan, Belief that plan would result in death  Demographic Factors:  Adolescent or young  adult  Loss Factors: NA  Historical Factors: Family history of mental illness or substance abuse and Impulsivity  Risk Reduction Factors:   Sense of responsibility to family, Religious beliefs about death, Living with another person, especially a relative, Positive social support, Positive therapeutic relationship and Positive coping skills or problem solving skills  Continued Clinical Symptoms:  Severe Anxiety and/or Agitation Bipolar Disorder:   Depressive phase Depression:   Impulsivity Recent sense of peace/wellbeing More than one psychiatric diagnosis Unstable or Poor Therapeutic Relationship Previous Psychiatric Diagnoses and Treatments Medical Diagnoses and Treatments/Surgeries  Cognitive Features That Contribute To Risk:  Polarized thinking    Suicide Risk:  Minimal: No identifiable suicidal ideation.  Patients presenting with no risk factors but with morbid ruminations; may be classified as minimal risk based on the severity of the depressive symptoms  Follow-up Information    [facility] Go on 03/20/2019.   Specialty:  Behavioral Health Why:  Please attend tele-health therapy appointment at 10 AM.  Please attend tele-health medication management appointment on 03/23/19 at 12:15 PM with Dr. Omelia Blackwater.  Contact information: [address] [place] [address] [phone]           Plan Of Care/Follow-up recommendations:  Activity:  As tolerated Diet:  Regular  Leata Mouse, MD 03/17/2019, 8:51 AM

## 2019-03-16 NOTE — [facility] Group Notes (Signed)
Child/Adolescent Psychoeducational Group Note  Date:  03/16/2019 Time:  8:24 PM  Group Topic/Focus:  Wrap-Up Group:   The focus of this group is to help patients review their daily goal of treatment and discuss progress on daily workbooks.  Participation Level:  Active  Participation Quality:  Appropriate, Attentive, Sharing and Supportive  Affect:  Appropriate  Cognitive:  Alert and Appropriate  Insight:  Appropriate and Good  Engagement in Group:  Engaged  Modes of Intervention:  Discussion, Education, Socialization and Support  Additional Comments:  Pt reported her goal for the day was to work on her self esteem and to make a list of things she is good at. Pt reports she can not identify anything she is good at. Peers were encouraging pt and telling her things she is good at such as making people laugh, being determined, and baking. Pt reported she has learned how to work on her impulse control since she has been here. Pt reports she has learned to not say things if people make comments she does not like. Pt reported she is ready for discharge on 03/17/2019.  Alfredo Bach 03/16/2019, 8:24 PM

## 2019-03-16 NOTE — Progress Notes (Signed)
7a-7p Shift:  D: Pt is depressed, blaming her relationship with her mother for her depression.   She states that she had gotten into an argument with her mother over filling out financial aid forms for college.  Pt stated that her mother got irritated with her when she asked, but later admitted that her mother had her "hands full" with 9 children to care for.     A:  Support, education, and encouragement provided as appropriate to situation.  Medications administered per MD order.  Level 3 checks continued for safety.   R:  Pt receptive to measures; Safety maintained.

## 2019-03-16 NOTE — [facility] Group Notes (Signed)
[facility] LCSW Group Therapy Note  Date/Time:  03/16/2019   2:25PM  Type of Therapy and Topic:  Group Therapy:  Healthy vs Unhealthy Coping Skills  Participation Level:  Active   Description of Group:  The focus of this group was to determine what unhealthy coping techniques typically are used by group members and what healthy coping techniques would be helpful in coping with various problems. Patients were guided in becoming aware of the differences between healthy and unhealthy coping techniques.  Patients were asked to identify 1 unhealthy coping skill they used prior to this hospitalization. Patients were then asked to identify 1-2 healthy coping skills they like to use, and many mentioned listening to music, coloring and taking a hot shower. These were further explored on how to implement them more effectively after discharge.   At the end of group, additional ideas of healthy coping skills were shared in discussion.   Therapeutic Goals 1. Patients learned that coping is what human beings do all day long to deal with various situations in their lives 2. Patients defined and discussed healthy vs unhealthy coping techniques 3. Patients identified their preferred coping techniques and identified whether these were healthy or unhealthy 4. Patients determined 1-2 healthy coping skills they would like to become more familiar with and use more often, and practiced a few meditations 5. Patients provided support and ideas to each other  Summary of Patient Progress: During group, patients defined coping skills and identified the difference between healthy and unhealthy coping skills. Patients were asked to identify the unhealthy coping skills they used that caused them to have to be hospitalized. Patients were then asked to discuss the alternate healthy coping skills that they could use in place of the healthy coping skill whenever they return home.  Patient actively participated in group; affect and mood  were appropriate. Patient completed the "Stoplight Problem Solving" worksheet that walked group members through situations where they could identified a better plan to deal with a difficult situation. Patient identified a difficult situation as "mom and I have gotten into it, and she did not identify any positives or negatives. She identified a plan of saying the following to her mother: "I'm sorry, you're right and I'm wrong. Can I go upstairs now, please." She also identified keeping her mouth shut and using "I" statements. She stated that if she used her play, her mother would probably tell her no she cannot go upstairs, and if she keeps her mouth shut, her mother cannot yell at herself.  Therapeutic Modalities Cognitive Behavioral Therapy Motivational Interviewing Solution Focused Therapy Brief Therapy    Roselyn Bering, MSW, LCSW Clinical Social Work 03/16/2019

## 2019-03-16 NOTE — Progress Notes (Signed)
Pt attended group on loss and grief facilitated by Chaplain Helaman Mecca, MDiv.   Group goal of identifying grief patterns, naming feelings / responses to grief, identifying behaviors that may emerge from grief responses, identifying when one may call on an ally or coping skill.  Following introductions and group rules, group opened with psycho-social ed. identifying types of loss (relationships / self / things) and identifying patterns, circumstances, and changes that precipitate losses. Group members spoke about losses they had experienced and the effect of those losses on their lives. Identified thoughts / feelings around this loss, working to share these with one another in order to normalize grief responses, as well as recognize variety in grief experience.   Group looked at illustration of journey of grief and group members identified where they felt like they are on this journey. Identified ways of caring for themselves.   Group facilitation drew on brief cognitive behavioral and Adlerian theory   

## 2019-03-16 NOTE — Progress Notes (Signed)
[facility] MD Progress Note  03/16/2019 1:16 PM Whitney Beard  MRN:  [id number] Subjective: "My day was okay but sad that my mom did not visit me but I spoke with her briefly.  I am working on making eye statement and writing down coping skills and my journal and attending groups and my appetite has been getting better".    Patient seen by this MD, chart reviewed and case discussed with treatment team.  In brief:  Whitney Beard is a 18 years old female with a history of bipolar disorder, ADHD, asthma, diabetes mellitus and morbid obesity admitted for suicidal attempt with multiple psychotropic medication after had an argument with her mother who hurt her emotionally.  On evaluation today: Patient appeared with ongoing symptoms of depression but less anxious and angry and her appetite is appropriate and somewhat bright.  Patient stated she has been communicating with the peer group and staff members and feels he was being supported.  Patient is stated she has been sad when her mother does not take time to come and visit her but able to talk to her on the phone.  Patient mother gave the option to her if she wants to come and stay with her or with her godmother.  Patient stated that she wants to go and stay with her mother instead of mother has been mean sometimes to her in the past.  Patient has been working on her goals for the day and also several new coping skills including self boosting her self esteem.  Patient rated her depression today as 5 out of 10, anxiety and anger is 0 out of 10, 10 being the worst.  Patient denies  suicidal/homicidal ideation, intention or plans.  Patient Sleep is not great but appetite has been improving.  Patient has been taking medication Latuda 40 mg daily with breakfast, tolerating well without side effects of the medication including GI upset or mood activation.    As per staff report: She is active in the milieu and has been positive for all groups and activities. Pt rates her day a  4/10 with sleep "fair" and appetite "improving". Pt is able to verbalize appropriate coping skills and provide support to peers. Pt is working on using "I statements" more when communicating with her mother.  Principal Problem: MDD (major depressive disorder), recurrent severe, without psychosis (HCC) Diagnosis: Principal Problem:   MDD (major depressive disorder), recurrent severe, without psychosis (HCC) Active Problems:   Overdose, intentional self-harm, initial encounter (HCC)   Anxiety disorder of adolescence   Suicide attempt [facility])  Total Time spent with patient: 30 minutes  Past Psychiatric History: Patient has multiple acute psychiatric [facility], #20 16 January 2018 and current and also had [facility] past.  Patient has a history of multiple intentional overdoses in the past.  Past Medical History:  Past Medical History:  Diagnosis Date  . ADHD (attention deficit hyperactivity disorder)   . Allergic rhinitis   . Asthma   . Bipolar 1 disorder (HCC)   . Diabetes mellitus without complication (HCC)    states today 03/09/16 "was told in the past that she was borderline diabetic"  . Obesity   . Sleep apnea   . Suicide attempt by drug ingestion [facility])     Past Surgical History:  Procedure Laterality Date  . ADENOIDECTOMY    . TONSILLECTOMY     Family History:  Family History  Problem Relation Age of  Onset  . Bipolar disorder Father   . Schizophrenia Father   . Bipolar disorder Maternal Grandmother   . SIDS Maternal Aunt   . Heart disease Paternal Grandmother    Family Psychiatric  History: Bipolar in biological father and 14 years old sister and maternal grandmother, depressions 79 years old sister and ODD brother and schizophrenia in paternal uncle. Social History:  Social History   Substance and Sexual Activity  Alcohol Use No     Social History   Substance and Sexual Activity  Drug Use No     Social History   Socioeconomic History  . Marital status: Single    Spouse name: Not on file  . Number of children: Not on file  . Years of education: Not on file  . Highest education level: Not on file  Occupational History  . Not on file  Social Needs  . Financial resource strain: Not on file  . Food insecurity:    Worry: Not on file    Inability: Not on file  . Transportation needs:    Medical: Not on file    Non-medical: Not on file  Tobacco Use  . Smoking status: Never Smoker  . Smokeless tobacco: Never Used  Substance and Sexual Activity  . Alcohol use: No  . Drug use: No  . Sexual activity: Never    Birth control/protection: None  Lifestyle  . Physical activity:    Days per week: Not on file    Minutes per session: Not on file  . Stress: Not on file  Relationships  . Social connections:    Talks on phone: Not on file    Gets together: Not on file    Attends religious service: Not on file    Active member of club or organization: Not on file    Attends meetings of clubs or organizations: Not on file    Relationship status: Not on file  Other Topics Concern  . Not on file  Social History Narrative   Lives with Mom, [place], 6 siblings and Grandmother.   Additional Social History:      Sleep: Fair  Appetite:  Fair -improving  Current Medications: Current Facility-Administered Medications  Medication Dose Route Frequency Provider Last Rate Last Dose  . acetaminophen (TYLENOL) tablet 650 mg  650 mg Oral Q6H PRN Money, Gerlene Burdock, FNP      . albuterol (PROVENTIL HFA;VENTOLIN HFA) 108 (90 Base) MCG/ACT inhaler 1 puff  1 puff Inhalation Q6H PRN Money, Gerlene Burdock, FNP   1 puff at 03/16/19 0801  . alum & mag hydroxide-simeth (MAALOX/MYLANTA) 200-200-20 MG/5ML suspension 30 mL  30 mL Oral Q6H PRN Money, Gerlene Burdock, FNP      . hydrOXYzine (ATARAX/VISTARIL) tablet 10 mg  10 mg Oral TID PRN Leata Mouse, MD      . lurasidone (LATUDA) tablet 40 mg  40 mg  Oral Q breakfast Leata Mouse, MD   40 mg at 03/16/19 0758  . magnesium hydroxide (MILK OF MAGNESIA) suspension 30 mL  30 mL Oral QHS PRN Money, Feliz Beam B, FNP      . metFORMIN (GLUCOPHAGE) tablet 500 mg  500 mg Oral Q breakfast Money, Gerlene Burdock, FNP   500 mg at 03/16/19 1610    Lab Results: No results found for this or any previous visit (from the past 48 hour(s)).  Blood Alcohol level:  Lab Results  Component Value Date   ETH <10 03/09/2019   ETH <10 01/12/2018    Metabolic  Disorder Labs: Lab Results  Component Value Date   HGBA1C 5.3 12/27/2018   MPG 105.41 12/27/2018   MPG 99.67 01/15/2018   No results found for: PROLACTIN Lab Results  Component Value Date   CHOL 165 12/27/2018   TRIG 56 12/27/2018   HDL 36 (L) 12/27/2018   CHOLHDL 4.6 12/27/2018   VLDL 11 12/27/2018   LDLCALC 118 (H) 12/27/2018   LDLCALC 107 (H) 01/15/2018    Physical Findings: AIMS: Facial and Oral Movements Muscles of Facial Expression: None, normal Lips and Perioral Area: None, normal Jaw: None, normal Tongue: None, normal,Extremity Movements Upper (arms, wrists, hands, fingers): None, normal Lower (legs, knees, ankles, toes): None, normal, Trunk Movements Neck, shoulders, hips: None, normal, Overall Severity Severity of abnormal movements (highest score from questions above): None, normal Incapacitation due to abnormal movements: None, normal Patient's awareness of abnormal movements (rate only patient's report): No Awareness, Dental Status Current problems with teeth and/or dentures?: No Does patient usually wear dentures?: No  CIWA:  CIWA-Ar Total: 1 COWS:  COWS Total Score: 0  Musculoskeletal: Strength & Muscle Tone: within normal limits Gait & Station: normal Patient leans: N/A  Psychiatric Specialty Exam: Physical Exam  ROS  Blood pressure 137/87, pulse (!) 105, temperature 97.8 F (36.6 C), temperature source Oral, resp. rate 20, height 5' 8.11" (1.73 m), weight (!)  171.1 kg, last menstrual period 03/12/2019, SpO2 100 %.Body mass index is 57.15 kg/m.  General Appearance: Casual  Eye Contact:  Good  Speech:  Clear and Coherent and Slow  Volume:  Normal  Mood:  Anxious and Depressed -feeling calm and better  Affect:  Constricted and Depressed -bright on approach  Thought Process:  Coherent, Goal Directed and Descriptions of Associations: Intact  Orientation:  Full (Time, Place, and Person)  Thought Content:  Rumination  Suicidal Thoughts:  No, denied today  Homicidal Thoughts:  No  Memory:  Immediate;   Fair Recent;   Fair Remote;   Fair  Judgement:  Intact  Insight:  Fair  Psychomotor Activity:  Normal  Concentration:  Concentration: Fair and Attention Span: Fair  Recall:  Good  Fund of Knowledge:  Good  Language:  Good  Akathisia:  Negative  Handed:  Right  AIMS (if indicated):     Assets:  Communication Skills Desire for Improvement Financial Resources/Insurance Housing Leisure Time Physical Health Resilience Social Support Talents/Skills Transportation Vocational/Educational  ADL's:  Intact  Cognition:  WNL  Sleep:        Treatment Plan Summary: Reviewed current treatment plan 03/16/2019  Daily contact with patient to assess and evaluate symptoms and progress in treatment and Medication management 1. Will maintain Q 15 minutes observation for safety. Estimated LOS: 5-7 days 2. Labs reviewed: CMP normal except calcium 8.7, albumin 30, AST 11 and total protein 6.0, lactic acid 1.3, CBC with differential-RBC 3.83 and hemoglobin 11.4 and hematocrit is 36.2 and platelets 337, acetaminophen-negative and urine tox screen negative for drugs of abuse. 3. Patient will participate in group, milieu, and family therapy. Psychotherapy: Social and Doctor, hospital, anti-bullying, learning based strategies, cognitive behavioral, and family object relations individuation separation intervention psychotherapies can be considered.   4. Bipolar depression: not improving; monitor response to Lurasidone 40 mg daily with breakfast starting March 15, 2019  5. Anxiety and insomnia: Continue Hydroxyzine 10 mg 3 times daily as needed for anxiety insomnia 6. Diabetes mellitus: Continue Metformin 500 mg daily with breakfast 7. Asthma: Continue prednisone 20 mg daily with breakfast for asthma and  albuterol inhaler every 6 hours as needed for wheezing and shortness of breath as per PCP 8. Will continue to monitor patient's mood and behavior. 9. Social Work will schedule a Family meeting to obtain collateral information and discuss discharge and follow up plan. 10. Discharge concerns will also be addressed: Safety, stabilization, and access to medication. 11. Expected date of discharge: March 17, 2019, at 10:30 AM  Leata MouseJonnalagadda Joann Jorge, MD 03/16/2019, 1:16 PM

## 2019-03-16 NOTE — Progress Notes (Signed)
Recreation Therapy Notes  Date: 03/16/2019 Time: 1:00 pm  Location: 600 hall  Group Topic: Communication, Team Building, Problem Solving, Healthy Support Systems  Goal Area(s) Addresses:  Patient will effectively work with peer towards shared goal.  Patient will identify skills used to make activity successful.  Patient will identify how skills used during activity can be used to reach post d/c goals.   Behavioral Response: appropriate  Intervention: Hands on Activity; Minefield  Activity: LRT instructed patients to create a grid on the floor using poly spots. Next the LRT made a guide of a pathway through the minefield. The patients had the objective to work their way through the Minefield on the correct path. The only person who knew the correct path was the LRT. Patients one by one were instructed to make their way thru the Minefield, and if they make a wrong move they are warned by the noise of "boom". If the patient heard "boom", they have to go to the back of the line and the next person has the opportunity to get through the path. The patients can not speak to each other when someone was on the minefield, but could speak before they stepped onto the field.  Each person had to make it across the field successfully.  LRT and patients debriefed on the importance of patience, communication, paying attention, asking peers for help, and problem solving.  Education: Pharmacist, community, Building control surveyor, Healthy Support Systems  Education Outcome: Acknowledges education.   Clinical Observations/Feedback: Patient worked well with peers and had a active level of participation during the activity.  Patient stated she learned to "ask for help when I need it".  Deidre Ala, LRT/CTRS         Quinita Kostelecky L Errik Mitchelle 03/16/2019 3:48 PM
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
## 2019-03-17 NOTE — Progress Notes (Signed)
Patient and guardian educated about follow up care, upcoming appointments reviewed. Patient verbalizes understanding of all follow up appointments. AVS and suicide safety plan reviewed. Patient expresses no concerns or questions at this time. Educated on prescriptions and medication regimen. Patient belongings returned. Patient denies SI, HI, AVH at this time. Educated patient about suicide help resources and hotline, encouraged to call for assistance in the event of a crisis. Patient agrees. Patient is ambulatory and safe at time of discharge. Patient discharged to hospital lobby at this time.  

## 2019-03-17 NOTE — Progress Notes (Signed)
Recreation Therapy Notes  INPATIENT RECREATION TR PLAN  Patient Details Name: Whitney Beard MRN: [id number] DOB: Mar 01, 2001 Today's Date: 03/17/2019  Rec Therapy Plan Is patient appropriate for Therapeutic Recreation?: Yes Treatment times per week: 3-5 times per week Estimated Length of Stay: 5-7 days TR Treatment/Interventions: Group participation (Comment)  Discharge Criteria Pt will be discharged from therapy if:: Discharged Treatment plan/goals/alternatives discussed and agreed upon by:: Patient/family  Discharge Summary Short term goals set: see patient care plan Short term goals met: Complete Progress toward goals comments: Groups attended Which groups?: Communication, Social skills, Coping skills, Leisure Materials engineer, Team Building, Support System, Psychoeducational group on society, ) Reason goals not met: n/a Therapeutic equipment acquired: none Reason patient discharged from therapy: Discharge from hospital Pt/family agrees with progress & goals achieved: Yes Date patient discharged from therapy: 03/17/19  Tomi Likens, LRT/CTRS  [place] 03/17/2019, 2:14 PM

## 2019-03-17 NOTE — Progress Notes (Signed)
Recreation Therapy Notes  Date: 03/17/2019 Time: 10:00 - 11:20 am  Location: 600 hall   Group Topic: Leisure Education   Goal Area(s) Addresses:  Patient will successfully identify benefits of leisure participation. Patient will successfully identify ways to access leisure activities.  Patient will listen on first prompt.   Behavioral Response: appropriate  Intervention: Game   Activity: Leisure game of 5 Seconds Rule. Each patient took a turn answering a trivia question. If the patient answered correctly in 5 seconds or less, they got the point. The group was split into two teams, and the team with the most cards wins.   Education:  Leisure Education, Building control surveyor   Education Outcome: Acknowledges education  Clinical Observations/Feedback: Patient worked well in group with a positive attitude.    Deidre Ala, LRT/CTRS         Hartman Minahan L Mckinzy Fuller 03/17/2019 2:12 PM

## 2019-03-17 NOTE — Progress Notes (Signed)
[facility] Child/Adolescent Case Management Discharge Plan :  Will you be returning to the same living situation after discharge: Yes,  Pt returning to mother Laverle Patter) care At discharge, do you have transportation home?:Yes,  Mother is picking pt up at 10:30 AM Do you have the ability to pay for your medications:Yes,  [place] barriers  Release of information consent forms completed and in the chart;  Patient's signature needed at discharge.  Patient to Follow up at: Follow-up Information    [facility] Go on 03/20/2019.   Specialty:  Behavioral Health Why:  Please attend tele-health therapy appointment at 10 AM.  Please attend tele-health medication management appointment on 03/23/19 at 12:15 PM with Dr. Omelia Blackwater.  Contact information: [address] [place] [address] [phone]           Family Contact:  Telephone:  Spoke with:  CSW spoke with pt and pt's mother. Pt is 18 and provided consent for writer to speak with mother  Aeronautical engineer and Suicide Prevention discussed:  Yes,  CSW discussed with pt and mother, Laverle Patter  Discharge Family Session: Pt will meet with discharging RN to review medication, AVS(aftercare appointments), school note, SPE and ROIs. Mother can attend session if pt gives permission as she is 18 years old.    Hollynn Garno S Stacy Sailer 03/17/2019, 9:31 AM   Daiana Vitiello S. Kelley Knoth, LCSWA, MSW [facility]: Child and Adolescent  [phone]

## 2019-03-17 NOTE — Progress Notes (Signed)
Child/Adolescent Psychoeducational Group Note  Date:  03/17/2019 Time:  12:23 PM  Group Topic/Focus:  Goals Group:   The focus of this group is to help patients establish daily goals to achieve during treatment and discuss how the patient can incorporate goal setting into their daily lives to aide in recovery.  Participation Level:  Active  Participation Quality:  Appropriate  Affect:  Appropriate  Cognitive:  Alert, Appropriate and Oriented  Insight:  Appropriate and Improving  Engagement in Group:  Developing/Improving  Modes of Intervention:  Problem-solving  Additional Comments: Pt's goal for today is to prepare for discharge and utilize what she's learned here. Pt identified 2 support peope in her life and what she looks for in a friend.  Jimmey Ralph 03/17/2019, 12:23 PM

## 2019-03-17 NOTE — Progress Notes (Signed)
[place] NOVEL CORONAVIRUS (COVID-19) DAILY CHECK-OFF SYMPTOMS - answer yes or no to each - every day NO YES  Have you had a fever in the past 24 hours?  . Fever (Temp > 37.80C / 100F) X   Have you had any of these symptoms in the past 24 hours? . New Cough .  Sore Throat  .  Shortness of Breath .  Difficulty Breathing .  Unexplained Body Aches   X   Have you had any one of these symptoms in the past 24 hours not related to allergies?   . Runny Nose .  Nasal Congestion .  Sneezing   X   If you have had runny nose, nasal congestion, sneezing in the past 24 hours, has it worsened?  X   EXPOSURES - check yes or no X   Have you traveled outside the state in the past 14 days?  X   Have you been in contact with someone with a confirmed diagnosis of COVID-19 or PUI in the past 14 days without wearing appropriate PPE?  X   Have you been living in the same home as a person with confirmed diagnosis of COVID-19 or a PUI (household contact)?    X   Have you been diagnosed with COVID-19?    X              What to do next: Answered NO to all: Answered YES to anything:   Proceed with unit schedule Follow the [facility] Inpatient Flowsheet.   

## 2019-05-17 ENCOUNTER — Emergency Department (HOSPITAL_COMMUNITY): Payer: Medicaid Other

## 2019-05-17 ENCOUNTER — Inpatient Hospital Stay (HOSPITAL_COMMUNITY)
Admission: EM | Admit: 2019-05-17 | Discharge: 2019-05-18 | DRG: 203 | Disposition: A | Discharge status: Home / Self Care | Payer: Medicaid Other | Attending: Family Medicine | Admitting: Family Medicine

## 2019-05-17 ENCOUNTER — Encounter (HOSPITAL_COMMUNITY): Payer: Self-pay | Admitting: Emergency Medicine

## 2019-05-17 ENCOUNTER — Other Ambulatory Visit: Payer: Self-pay

## 2019-05-17 DIAGNOSIS — E119 Type 2 diabetes mellitus without complications: Secondary | ICD-10-CM | POA: Diagnosis present

## 2019-05-17 DIAGNOSIS — Z9089 Acquired absence of other organs: Secondary | ICD-10-CM | POA: Diagnosis not present

## 2019-05-17 DIAGNOSIS — J309 Allergic rhinitis, unspecified: Secondary | ICD-10-CM | POA: Diagnosis present

## 2019-05-17 DIAGNOSIS — Z20828 Contact with and (suspected) exposure to other viral communicable diseases: Secondary | ICD-10-CM | POA: Diagnosis present

## 2019-05-17 DIAGNOSIS — Z68.41 Body mass index (BMI) pediatric, greater than or equal to 95th percentile for age: Secondary | ICD-10-CM | POA: Diagnosis not present

## 2019-05-17 DIAGNOSIS — Z8249 Family history of ischemic heart disease and other diseases of the circulatory system: Secondary | ICD-10-CM

## 2019-05-17 DIAGNOSIS — Z79899 Other long term (current) drug therapy: Secondary | ICD-10-CM | POA: Diagnosis not present

## 2019-05-17 DIAGNOSIS — E66813 Obesity, class 3: Secondary | ICD-10-CM

## 2019-05-17 DIAGNOSIS — Z818 Family history of other mental and behavioral disorders: Secondary | ICD-10-CM | POA: Diagnosis not present

## 2019-05-17 DIAGNOSIS — J4541 Moderate persistent asthma with (acute) exacerbation: Secondary | ICD-10-CM | POA: Diagnosis not present

## 2019-05-17 DIAGNOSIS — J4551 Severe persistent asthma with (acute) exacerbation: Principal | ICD-10-CM | POA: Diagnosis present

## 2019-05-17 DIAGNOSIS — Z915 Personal history of self-harm: Secondary | ICD-10-CM

## 2019-05-17 DIAGNOSIS — G4733 Obstructive sleep apnea (adult) (pediatric): Secondary | ICD-10-CM | POA: Diagnosis present

## 2019-05-17 DIAGNOSIS — F319 Bipolar disorder, unspecified: Secondary | ICD-10-CM | POA: Diagnosis present

## 2019-05-17 DIAGNOSIS — J45901 Unspecified asthma with (acute) exacerbation: Secondary | ICD-10-CM | POA: Diagnosis present

## 2019-05-17 DIAGNOSIS — Z6841 Body Mass Index (BMI) 40.0 and over, adult: Secondary | ICD-10-CM

## 2019-05-17 DIAGNOSIS — Z7984 Long term (current) use of oral hypoglycemic drugs: Secondary | ICD-10-CM

## 2019-05-17 DIAGNOSIS — Z7951 Long term (current) use of inhaled steroids: Secondary | ICD-10-CM | POA: Diagnosis not present

## 2019-05-17 DIAGNOSIS — E1169 Type 2 diabetes mellitus with other specified complication: Secondary | ICD-10-CM | POA: Diagnosis present

## 2019-05-17 DIAGNOSIS — J45909 Unspecified asthma, uncomplicated: Secondary | ICD-10-CM | POA: Diagnosis present

## 2019-05-17 DIAGNOSIS — R0602 Shortness of breath: Secondary | ICD-10-CM | POA: Diagnosis not present

## 2019-05-17 LAB — CBC WITH DIFFERENTIAL/PLATELET
Abs Immature Granulocytes: 0.02 10*3/uL (ref 0.00–0.07)
Basophils Absolute: 0.1 10*3/uL (ref 0.0–0.1)
Basophils Relative: 1 %
Eosinophils Absolute: 0.4 10*3/uL (ref 0.0–0.5)
Eosinophils Relative: 5 %
HCT: 37.4 % (ref 36.0–46.0)
Hemoglobin: 12.2 g/dL (ref 12.0–15.0)
Immature Granulocytes: 0 %
Lymphocytes Relative: 32 %
Lymphs Abs: 2.7 10*3/uL (ref 0.7–4.0)
MCH: 30.3 pg (ref 26.0–34.0)
MCHC: 32.6 g/dL (ref 30.0–36.0)
MCV: 93 fL (ref 80.0–100.0)
Monocytes Absolute: 0.6 10*3/uL (ref 0.1–1.0)
Monocytes Relative: 7 %
Neutro Abs: 4.7 10*3/uL (ref 1.7–7.7)
Neutrophils Relative %: 55 %
Platelets: 328 10*3/uL (ref 150–400)
RBC: 4.02 MIL/uL (ref 3.87–5.11)
RDW: 13.8 % (ref 11.5–15.5)
WBC: 8.4 10*3/uL (ref 4.0–10.5)
nRBC: 0 % (ref 0.0–0.2)

## 2019-05-17 LAB — I-STAT BETA HCG BLOOD, ED (MC, WL, AP ONLY): I-stat hCG, quantitative: <5 m[IU]/mL (ref <5)

## 2019-05-17 LAB — HEMOGLOBIN A1C
Hgb A1c MFr Bld: 5 % (ref 4.8–5.6)
Mean Plasma Glucose: 96.8 mg/dL

## 2019-05-17 LAB — SARS CORONAVIRUS 2 BY RT PCR (HOSPITAL ORDER, PERFORMED IN ~~LOC~~ HOSPITAL LAB): SARS Coronavirus 2: NEGATIVE

## 2019-05-17 LAB — GLUCOSE, CAPILLARY
Glucose-Capillary: 133 mg/dL — ABNORMAL HIGH (ref 70–99)
Glucose-Capillary: 128 mg/dL — ABNORMAL HIGH (ref 70–99)
Glucose-Capillary: 125 mg/dL — ABNORMAL HIGH (ref 70–99)

## 2019-05-17 LAB — BASIC METABOLIC PANEL
Anion gap: 10 (ref 5–15)
BUN: 13 mg/dL (ref 6–20)
CO2: 20 mmol/L — ABNORMAL LOW (ref 22–32)
Calcium: 8.3 mg/dL — ABNORMAL LOW (ref 8.9–10.3)
Chloride: 108 mmol/L (ref 98–111)
Creatinine, Ser: 0.58 mg/dL (ref 0.44–1.00)
GFR calc Af Amer: >60 mL/min (ref >60)
GFR calc non Af Amer: >60 mL/min (ref >60)
Glucose, Bld: 112 mg/dL — ABNORMAL HIGH (ref 70–99)
Potassium: 4.1 mmol/L (ref 3.5–5.1)
Sodium: 138 mmol/L (ref 135–145)

## 2019-05-17 LAB — MRSA PCR SCREENING: MRSA by PCR: NEGATIVE

## 2019-05-17 MED ORDER — INSULIN ASPART 100 UNIT/ML ~~LOC~~ SOLN: 0.0000 [IU] | Freq: Every day | SUBCUTANEOUS | Status: DC

## 2019-05-17 MED ORDER — METHYLPREDNISOLONE SODIUM SUCC 125 MG IJ SOLR
60.0000 mg | Freq: Three times a day (TID) | INTRAMUSCULAR | Status: DC
  Administered 2019-05-17 – 2019-05-18 (×3): 60 mg via INTRAVENOUS
  Filled 2019-05-17 (×3): qty 2

## 2019-05-17 MED ORDER — INSULIN ASPART 100 UNIT/ML ~~LOC~~ SOLN
0.0000 [IU] | Freq: Three times a day (TID) | SUBCUTANEOUS | Status: DC
  Administered 2019-05-17 – 2019-05-18 (×3): 3 [IU] via SUBCUTANEOUS

## 2019-05-17 MED ORDER — IPRATROPIUM BROMIDE 0.02 % IN SOLN
0.5000 mg | Freq: Four times a day (QID) | RESPIRATORY_TRACT | Status: DC
  Administered 2019-05-17 – 2019-05-18 (×3): 0.5 mg via RESPIRATORY_TRACT
  Filled 2019-05-17 (×3): qty 2.5

## 2019-05-17 MED ORDER — LEVALBUTEROL HCL 0.63 MG/3ML IN NEBU: 0.6300 mg | INHALATION_SOLUTION | Freq: Four times a day (QID) | RESPIRATORY_TRACT | Status: DC | PRN

## 2019-05-17 MED ORDER — CHLORHEXIDINE GLUCONATE CLOTH 2 % EX PADS: 6.0000 | MEDICATED_PAD | Freq: Every day | CUTANEOUS | Status: DC

## 2019-05-17 MED ORDER — ALBUTEROL (5 MG/ML) CONTINUOUS INHALATION SOLN
10.0000 mg/h | INHALATION_SOLUTION | Freq: Once | RESPIRATORY_TRACT | Status: AC
  Administered 2019-05-17: 11:00:00 10 mg/h via RESPIRATORY_TRACT
  Filled 2019-05-17: qty 20

## 2019-05-17 MED ORDER — MONTELUKAST SODIUM 10 MG PO TABS
10.0000 mg | ORAL_TABLET | Freq: Every day | ORAL | Status: DC
  Administered 2019-05-17: 10 mg via ORAL
  Filled 2019-05-17: qty 1

## 2019-05-17 MED ORDER — LURASIDONE HCL 20 MG PO TABS
40.0000 mg | ORAL_TABLET | Freq: Every day | ORAL | Status: DC
  Administered 2019-05-18: 40 mg via ORAL
  Filled 2019-05-17: qty 2

## 2019-05-17 MED ORDER — DM-GUAIFENESIN ER 30-600 MG PO TB12
2.0000 | ORAL_TABLET | Freq: Two times a day (BID) | ORAL | Status: DC
  Administered 2019-05-17 – 2019-05-18 (×3): 2 via ORAL
  Filled 2019-05-17 (×3): qty 2

## 2019-05-17 MED ORDER — ENOXAPARIN SODIUM 60 MG/0.6ML ~~LOC~~ SOLN
60.0000 mg | SUBCUTANEOUS | Status: DC
  Administered 2019-05-17: 60 mg via SUBCUTANEOUS
  Filled 2019-05-17: qty 0.6

## 2019-05-17 MED ORDER — LEVALBUTEROL HCL 1.25 MG/0.5ML IN NEBU
1.2500 mg | INHALATION_SOLUTION | Freq: Four times a day (QID) | RESPIRATORY_TRACT | Status: DC
  Administered 2019-05-17 – 2019-05-18 (×3): 1.25 mg via RESPIRATORY_TRACT
  Filled 2019-05-17 (×3): qty 0.5

## 2019-05-17 MED ORDER — IPRATROPIUM-ALBUTEROL 0.5-2.5 (3) MG/3ML IN SOLN
3.0000 mL | Freq: Four times a day (QID) | RESPIRATORY_TRACT | Status: DC
  Administered 2019-05-17: 3 mL via RESPIRATORY_TRACT
  Filled 2019-05-17: qty 3

## 2019-05-17 MED ORDER — ALBUTEROL SULFATE HFA 108 (90 BASE) MCG/ACT IN AERS
8.0000 | INHALATION_SPRAY | RESPIRATORY_TRACT | Status: AC
  Administered 2019-05-17: 8 via RESPIRATORY_TRACT
  Filled 2019-05-17: qty 6.7

## 2019-05-17 MED ORDER — IPRATROPIUM BROMIDE 0.02 % IN SOLN
1.0000 mg | Freq: Once | RESPIRATORY_TRACT | Status: AC
  Administered 2019-05-17: 1 mg via RESPIRATORY_TRACT
  Filled 2019-05-17: qty 5

## 2019-05-17 MED ORDER — AZITHROMYCIN 250 MG PO TABS
500.0000 mg | ORAL_TABLET | Freq: Every day | ORAL | Status: DC
  Administered 2019-05-17 – 2019-05-18 (×2): 500 mg via ORAL
  Filled 2019-05-17 (×2): qty 2

## 2019-05-17 NOTE — ED Notes (Signed)
Report called to ICU RN, Patient will need a specific bed, RN will take patient upstairs when bed is ready

## 2019-05-17 NOTE — ED Notes (Signed)
Ambulated patient around the nurses station. Patients heart rate increased to 130's-140's, increase wheezing, increasing work of breathing with excertion despite patient stating she was not short of breath. Oxygen level decreased down to 92% once, but was able to maintain in the high 90's with oxygen. Informed Dr. Thurnell Garbe is aware.

## 2019-05-17 NOTE — ED Notes (Signed)
Pt reports using home rescue inhaler and home nebulizing treatments last night with no relief.

## 2019-05-17 NOTE — ED Provider Notes (Signed)
[facility]-EMERGENCY DEPT Provider Note   CSN: [id number] Arrival date & time: 05/17/19  [id number]     History   Chief Complaint Chief [id number]  Patient presents with   Shortness of Breath    Asthma    HPI Whitney Beard is a 18 y.o. female.     HPI  Pt was seen at 0840.  Per pt, c/o gradual onset and worsening of persistent cough, wheezing and SOB for the past 2 days.  Describes her symptoms as "my asthma is acting up because of the weather."  Has been using home MDI and nebs with "some" relief. EMS gave IV solumedrol and IV magnesium 2gm en route without improvement.  Denies CP/palpitations, no back pain, no abd pain, no N/V/D, no fevers, no rash.    Past Medical History:  Diagnosis Date   ADHD (attention deficit hyperactivity disorder)    Allergic rhinitis    Asthma    Bipolar 1 disorder (HCC)    Diabetes mellitus without complication (HCC)    states today 03/09/16 "was told in the past that she was borderline diabetic"   Obesity    Sleep apnea    Suicide attempt by drug ingestion [facility](HCC)     Patient Active Problem List   Diagnosis Date Noted   Diabetes mellitus type 2 in obese (HCC) 03/09/2019   Suicide attempt (HCC)    Hypokalemia 12/26/2018   OSA (obstructive sleep apnea) 12/26/2018   Morbid obesity with BMI of 50.0-59.9, adult (HCC) 12/26/2018   Asthma exacerbation 10/16/2017   Status asthmaticus 01/19/2017   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      Home Medications    Prior to Admission medications   Medication Sig Start Date End Date Taking? Authorizing Provider  albuterol (PROVENTIL  HFA;VENTOLIN HFA) 108 (90 Base) MCG/ACT inhaler Inhale 2 puffs into the lungs every 4 (four) hours as needed. For wheeze or shortness of breath Patient taking differently: Inhale 2 puffs into the lungs every 4 (four) hours as needed for wheezing or shortness of breath.  12/27/18  Yes Pokhrel, Laxman, MD  beclomethasone (QVAR) 80 MCG/ACT inhaler Inhale 2 puffs into the lungs 2 (two) times daily. 01/22/17  Yes Duffus, Huntley DecSara, MD  hydrOXYzine (ATARAX/VISTARIL) 10 MG tablet Take 1 tablet (10 mg total) by mouth 3 (three) times daily as needed for anxiety. 01/19/18  Yes Denzil Magnusonhomas, Lashunda, NP  lurasidone (LATUDA) 20 MG TABS tablet Take 2 tablets (40 mg total) by mouth daily with breakfast. 03/17/19  Yes Leata MouseJonnalagadda, Janardhana, MD  metFORMIN (GLUCOPHAGE) 500 MG tablet Take 1 tablet (500 mg total) by mouth daily with breakfast. 01/19/18  Yes Denzil Magnusonhomas, Lashunda, NP  montelukast (SINGULAIR) 10 MG tablet Take 10 mg by mouth at bedtime.   Yes [provider]    Family History Family History  Problem Relation Age of Onset   Bipolar disorder Father    Schizophrenia Father    Bipolar disorder Maternal Grandmother    SIDS Maternal Aunt    Heart disease Paternal Grandmother     Social History Social History   Tobacco Use   Smoking status: Never Smoker   Smokeless tobacco:  Never Used  Substance Use Topics   Alcohol use: No   Drug use: No     Allergies   Patient has no known allergies.   Review of Systems Review of Systems ROS: Statement: All systems negative except as marked or noted in the HPI; Constitutional: Negative for fever and chills. ; ; Eyes: Negative for eye pain, redness and discharge. ; ; ENMT: Negative for ear pain, hoarseness, nasal congestion, sinus pressure and sore throat. ; ; Cardiovascular: Negative for chest pain, palpitations, diaphoresis, and peripheral edema. ; ; Respiratory: +SOB, wheezing, cough. Negative for stridor. ; ; Gastrointestinal: Negative for nausea,  vomiting, diarrhea, abdominal pain, blood in stool, hematemesis, jaundice and rectal bleeding. . ; ; Genitourinary: Negative for dysuria, flank pain and hematuria. ; ; Musculoskeletal: Negative for back pain and neck pain. Negative for swelling and trauma.; ; Skin: Negative for pruritus, rash, abrasions, blisters, bruising and skin lesion.; ; Neuro: Negative for headache, lightheadedness and neck stiffness. Negative for weakness, altered level of consciousness, altered mental status, extremity weakness, paresthesias, involuntary movement, seizure and syncope.       Physical Exam Updated Vital Signs BP 108/81    Pulse (!) 114    Resp (!) 22    SpO2 100%    Patient Vitals for the past 24 hrs:  BP Pulse Resp SpO2  05/17/19 1230 122/67 (!) 125 (!) 26 93 %  05/17/19 1130 112/67 (!) 140 (!) 28 100 %  05/17/19 1115 -- (!) 127 (!) 28 100 %  05/17/19 1100 112/63 (!) 120 (!) 27 100 %  05/17/19 1044 -- (!) 102 (!) 22 99 %  05/17/19 1030 123/81 (!) 112 (!) 22 99 %  05/17/19 0930 108/81 (!) 114 (!) 22 100 %  05/17/19 0900 128/72 (!) 125 (!) 24 99 %  05/17/19 0836 -- -- -- 96 %  05/17/19 0833 -- (!) 124 (!) 25 94 %  05/17/19 0830 124/84 (!) 124 (!) 28 95 %  05/17/19 0824 -- -- -- 95 %     Physical Exam 0845: Physical examination:  Nursing notes reviewed; Vital signs and O2 SAT reviewed;  Constitutional: Well developed, Well nourished, Well hydrated, Uncomfortable appearing.;; Head:  Normocephalic, atraumatic; Eyes: EOMI, PERRL, No scleral icterus; ENMT: Mouth and pharynx normal, Mucous membranes moist; Neck: Supple, Full range of motion, No lymphadenopathy; Cardiovascular: Tachycardic rate and rhythm, No gallop; Respiratory: Breath sounds diminished & equal bilaterally, insp/exp wheezes bilat.  Audible wheezing.  Speaking short sentences, sitting upright, tachypneic.; Chest: Nontender, Movement normal; Abdomen: Soft, Nontender, Nondistended, Normal bowel sounds; Genitourinary: No CVA tenderness;  Extremities: Pulses normal, No tenderness, No edema, No calf edema or asymmetry.; Neuro: AA&Ox3, Major CN grossly intact.  Speech clear. No gross focal motor or sensory deficits in extremities.; Skin: Color normal, Warm, Dry.    ED Treatments / Results  Labs (all labs ordered are listed, but only abnormal results are displayed)   EKG None  Radiology   Procedures Procedures (including critical care time)  Medications Ordered in ED Medications  albuterol (PROVENTIL,VENTOLIN) solution continuous neb (has no administration in time range)  ipratropium (ATROVENT) nebulizer solution 1 mg (has no administration in time range)  albuterol (VENTOLIN HFA) 108 (90 Base) MCG/ACT inhaler 8 puff (8 puffs Inhalation Given 05/17/19 0849)     Initial Impression / Assessment and Plan / ED Course  I have reviewed the triage vital signs and the nursing notes.  Pertinent labs & imaging results that were available during my care of the  patient were reviewed by me and considered in my medical decision making (see chart for details).     MDM Reviewed: previous chart, nursing note and vitals Reviewed previous: labs Interpretation: labs and x-ray Total time providing critical care: 30-74 minutes. This excludes time spent performing separately reportable procedures and services. Consults: admitting MD   CRITICAL CARE Performed by: Francine Graven Total critical care time: 45 minutes Critical care time was exclusive of separately billable procedures and treating other patients. Critical care was necessary to treat or prevent imminent or life-threatening deterioration. Critical care was time spent personally by me on the following activities: development of treatment plan with patient and/or surrogate as well as nursing, discussions with consultants, evaluation of patient's response to treatment, examination of patient, obtaining history from patient or surrogate, ordering and performing treatments  and interventions, ordering and review of laboratory studies, ordering and review of radiographic studies, pulse oximetry and re-evaluation of patient's condition.  Results for orders placed or performed during the hospital encounter of 05/17/19  SARS Coronavirus 2 (CEPHEID - Performed in [place] hospital lab), [facility] Order   Specimen: Nasopharyngeal Swab  Result Value Ref Range   SARS Coronavirus 2 NEGATIVE NEGATIVE  Basic metabolic panel  Result Value Ref Range   Sodium 138 135 - 145 mmol/L   Potassium 4.1 3.5 - 5.1 mmol/L   Chloride 108 98 - 111 mmol/L   CO2 20 (L) 22 - 32 mmol/L   Glucose, Bld 112 (H) 70 - 99 mg/dL   BUN 13 6 - 20 mg/dL   Creatinine, Ser 0.58 0.44 - 1.00 mg/dL   Calcium 8.3 (L) 8.9 - 10.3 mg/dL   GFR calc non Af Amer >60 >60 mL/min   GFR calc Af Amer >60 >60 mL/min   Anion gap 10 5 - 15  CBC with Differential  Result Value Ref Range   WBC 8.4 4.0 - 10.5 K/uL   RBC 4.02 3.87 - 5.11 MIL/uL   Hemoglobin 12.2 12.0 - 15.0 g/dL   HCT 37.4 36.0 - 46.0 %   MCV 93.0 80.0 - 100.0 fL   MCH 30.3 26.0 - 34.0 pg   MCHC 32.6 30.0 - 36.0 g/dL   RDW 13.8 11.5 - 15.5 %   Platelets 328 150 - 400 K/uL   nRBC 0.0 0.0 - 0.2 %   Neutrophils Relative % 55 %   Neutro Abs 4.7 1.7 - 7.7 K/uL   Lymphocytes Relative 32 %   Lymphs Abs 2.7 0.7 - 4.0 K/uL   Monocytes Relative 7 %   Monocytes Absolute 0.6 0.1 - 1.0 K/uL   Eosinophils Relative 5 %   Eosinophils Absolute 0.4 0.0 - 0.5 K/uL   Basophils Relative 1 %   Basophils Absolute 0.1 0.0 - 0.1 K/uL   Immature Granulocytes 0 %   Abs Immature Granulocytes 0.02 0.00 - 0.07 K/uL  I-Stat beta hCG blood, ED  Result Value Ref Range   I-stat hCG, quantitative <5.0 <5 mIU/mL   Comment 3           Dg Chest Port 1 View Result Date: 05/17/2019 CLINICAL DATA:  Cough beginning 2 days ago with wheezing since last night. EXAM: PORTABLE CHEST 1 VIEW COMPARISON:  12/26/2018 FINDINGS: The heart size and mediastinal contours are within normal  limits. Both lungs are clear. The visualized skeletal structures are unremarkable. IMPRESSION: No active disease. Electronically Signed   By: Marin Olp M.D.   On: 05/17/2019 09:24    Whitney Beard was  evaluated in Emergency Department on 05/17/2019 for the symptoms described in the history of present illness. She was evaluated in the context of the global COVID-19 pandemic, which necessitated consideration that the patient might be at risk for infection with the SARS-CoV-2 virus that causes COVID-19. Institutional protocols and algorithms that pertain to the evaluation of patients at risk for COVID-19 are in a state of rapid change based on information released by regulatory bodies including the CDC and federal and state organizations. These policies and algorithms were followed during the patient's care in the ED.    1305:  On arrival: pt sitting upright, tachypneic, tachycardic, Sats 94 % R/A, lungs diminished with wheezing. Initially albuterol MDI 8 puffs given with minimal improvement. COVID testing negative:  hour long neb started. After neb: pt appears more comfortable at rest, less tachypneic, Sats 95-99 % on O2 2L N/C, lungs continue diminished with faint wheezing. Pt ambulated in hallway: pt's O2 Sats dropped to 92 % R/A, HR increasing to 140's, with pt c/o increasing SOB with increased wheezing and RR. Pt escorted back to stretcher with O2 Sats slowly increasing to 93 % R/A. Will admit.  T/C returned from [facility], case discussed, including:  HPI, pertinent PM/SHx, VS/PE, dx testing, ED course and treatment:  Agreeable to admit.      Final Clinical Impressions(s) / ED Diagnoses   Final diagnoses:  None    ED Discharge Orders    None       Samuel JesterMcManus, Jaycub Noorani, DO 05/22/19 [id number]

## 2019-05-17 NOTE — ED Notes (Signed)
Bed: XN17 Expected date:  Expected time:  Means of arrival:  Comments: EMS: Asthma Attack

## 2019-05-17 NOTE — ED Notes (Signed)
RT notified of orders for hour long breathing tx.

## 2019-05-17 NOTE — H&P (Addendum)
History and Physical    Whitney Beard ZOX:[id number] DOB: 03-21-2001 DOA: 05/17/2019  PCP: Brooke Paceurham, Megan, MD   Patient coming from: Home    Chief Complaint: Shortness of breath  HPI: Whitney Beard is a 18 y.o. female with medical history significant of bipolar disorder, severe persistent asthma, morbid obesity, non-insulin-dependent diabetes mellitus, depression who presents from home with complaints of shortness of breath.  Patient has been diagnosed with asthma since she was a small child.  She has been intubated in the past once.  Patient states she has asthma attacks once or twice a day but those attacks resolve after taking her inhalers.  She does  not follow-up with pulmonology and her primary doctor is managing her asthma. Her respiratory symptoms started last night.  She was feeling cold.  She started having shortness of breath and was barely able to climb the stairs at home.  Her breathing treatment did not work and she presented to the emergency department today.  Patient also reports of productive cough with yellowish sputum. Patient seen and examined the bedside in the emergency department.  She was found to be tachycardic but saturating fine on room air.  She denies any fever, chills, chest pain, abdominal pain, dysuria, nausea, vomiting, diarrhea or headache.  ED Course: Chest x-ray did not show pneumonia.  Had severe bilateral expiratory wheezes but maintaining saturation on room air.  Treated with IV steroids and magnesium.  Review of Systems: As per HPI otherwise 10 point review of systems negative.    Past Medical History:  Diagnosis Date  . ADHD (attention deficit hyperactivity disorder)   . Allergic rhinitis   . Asthma   . Bipolar 1 disorder (HCC)   . Diabetes mellitus without complication (HCC)    states today 03/09/16 "was told in the past that she was borderline diabetic"  . Obesity   . Sleep apnea   . Suicide attempt by drug ingestion [facility](HCC)     Past Surgical  History:  Procedure Laterality Date  . ADENOIDECTOMY    . TONSILLECTOMY       reports that she has never smoked. She has never used smokeless tobacco. She reports that she does not drink alcohol or use drugs.  No Known Allergies  Family History  Problem Relation Age of Onset  . Bipolar disorder Father   . Schizophrenia Father   . Bipolar disorder Maternal Grandmother   . SIDS Maternal Aunt   . Heart disease Paternal Grandmother      Prior to Admission medications   Medication Sig Start Date End Date Taking? Authorizing Provider  albuterol (PROVENTIL HFA;VENTOLIN HFA) 108 (90 Base) MCG/ACT inhaler Inhale 2 puffs into the lungs every 4 (four) hours as needed. For wheeze or shortness of breath Patient taking differently: Inhale 2 puffs into the lungs every 4 (four) hours as needed for wheezing or shortness of breath.  12/27/18  Yes Pokhrel, Laxman, MD  beclomethasone (QVAR) 80 MCG/ACT inhaler Inhale 2 puffs into the lungs 2 (two) times daily. 01/22/17  Yes Duffus, Huntley DecSara, MD  hydrOXYzine (ATARAX/VISTARIL) 10 MG tablet Take 1 tablet (10 mg total) by mouth 3 (three) times daily as needed for anxiety. 01/19/18  Yes Denzil Magnusonhomas, Lashunda, NP  lurasidone (LATUDA) 20 MG TABS tablet Take 2 tablets (40 mg total) by mouth daily with breakfast. 03/17/19  Yes Leata MouseJonnalagadda, Janardhana, MD  metFORMIN (GLUCOPHAGE) 500 MG tablet Take 1 tablet (500 mg total) by mouth daily with breakfast. 01/19/18  Yes Denzil Magnusonhomas, Lashunda, NP  montelukast (SINGULAIR)  10 MG tablet Take 10 mg by mouth at bedtime.   Yes [provider]    Physical Exam: Vitals:   05/17/19 1100 05/17/19 1115 05/17/19 1130 05/17/19 1230  BP: 112/63  112/67 122/67  Pulse: (!) 120 (!) 127 (!) 140 (!) 125  Resp: (!) 27 (!) 28 (!) 28 (!) 26  SpO2: 100% 100% 100% 93%    Constitutional: In mild to moderate respiratory distress, morbidly obese Vitals:   05/17/19 1100 05/17/19 1115 05/17/19 1130 05/17/19 1230  BP: 112/63  112/67 122/67  Pulse:  (!) 120 (!) 127 (!) 140 (!) 125  Resp: (!) 27 (!) 28 (!) 28 (!) 26  SpO2: 100% 100% 100% 93%   Eyes: PERRL, lids and conjunctivae normal ENMT: Mucous membranes are moist. Posterior pharynx clear of any exudate or lesions.Normal dentition.  Neck: normal, supple, no masses, no thyromegaly Respiratory: Decreased air entry bilaterally, bilateral expiratory wheezes. No accessory muscle use.  Cardiovascular: Regular rate and rhythm, no murmurs / rubs / gallops. No extremity edema. 2+ pedal pulses. No carotid bruits.  Abdomen: no tenderness, no masses palpated. No hepatosplenomegaly. Bowel sounds positive.  Musculoskeletal: no clubbing / cyanosis. No joint deformity upper and lower extremities. Good ROM, no contractures. Normal muscle tone.  Skin: no rashes, lesions, ulcers. No induration Neurologic: CN 2-12 grossly intact. Sensation intact, DTR normal. Strength 5/5 in all 4.  Psychiatric: Normal judgment and insight. Alert and oriented x 3. Normal mood.   Foley Catheter:None  Labs on Admission: I have personally reviewed following labs and imaging studies  CBC: Recent Labs  Lab 05/17/19 0847  WBC 8.4  NEUTROABS 4.7  HGB 12.2  HCT 37.4  MCV 93.0  PLT 836   Basic Metabolic Panel: Recent Labs  Lab 05/17/19 0847  NA 138  K 4.1  CL 108  CO2 20*  GLUCOSE 112*  BUN 13  CREATININE 0.58  CALCIUM 8.3*   GFR: CrCl cannot be calculated (Unknown ideal weight.). Liver Function Tests: No results for input(s): AST, ALT, ALKPHOS, BILITOT, PROT, ALBUMIN in the last 168 hours. No results for input(s): LIPASE, AMYLASE in the last 168 hours. No results for input(s): AMMONIA in the last 168 hours. Coagulation Profile: No results for input(s): INR, PROTIME in the last 168 hours. Cardiac Enzymes: No results for input(s): CKTOTAL, CKMB, CKMBINDEX, TROPONINI in the last 168 hours. BNP (last 3 results) No results for input(s): PROBNP in the last 8760 hours. HbA1C: No results for input(s):  HGBA1C in the last 72 hours. CBG: No results for input(s): GLUCAP in the last 168 hours. Lipid Profile: No results for input(s): CHOL, HDL, LDLCALC, TRIG, CHOLHDL, LDLDIRECT in the last 72 hours. Thyroid Function Tests: No results for input(s): TSH, T4TOTAL, FREET4, T3FREE, THYROIDAB in the last 72 hours. Anemia Panel: No results for input(s): VITAMINB12, FOLATE, FERRITIN, TIBC, IRON, RETICCTPCT in the last 72 hours. Urine analysis:    Component Value Date/Time   COLORURINE YELLOW 01/24/2017 1140   APPEARANCEUR HAZY (A) 01/24/2017 1140   LABSPEC 1.025 01/24/2017 1140   PHURINE 5.0 01/24/2017 1140   GLUCOSEU NEGATIVE 01/24/2017 1140   HGBUR NEGATIVE 01/24/2017 1140   BILIRUBINUR NEGATIVE 01/24/2017 1140   KETONESUR NEGATIVE 01/24/2017 1140   PROTEINUR NEGATIVE 01/24/2017 1140   UROBILINOGEN 0.2 01/08/2013 2339   NITRITE NEGATIVE 01/24/2017 1140   LEUKOCYTESUR NEGATIVE 01/24/2017 1140    Radiological Exams on Admission: Dg Chest Port 1 View  Result Date: 05/17/2019 CLINICAL DATA:  Cough beginning 2 days ago with wheezing  since last night. EXAM: PORTABLE CHEST 1 VIEW COMPARISON:  12/26/2018 FINDINGS: The heart size and mediastinal contours are within normal limits. Both lungs are clear. The visualized skeletal structures are unremarkable. IMPRESSION: No active disease. Electronically Signed   By: Elberta Fortisaniel  Boyle M.D.   On: 05/17/2019 09:24     Assessment/Plan Principal Problem:   Asthma exacerbation Active Problems:   Affective psychosis, bipolar (HCC)   Morbid obesity with BMI of 50.0-59.9, adult (HCC)   Diabetes mellitus type 2 in obese (HCC)   Asthma  Acute severe Asthma exacerbation: Continue nebulizing treatment, IV steroids.  Monitor on stepdown. She reported of cold symptoms.  CXR did not show pneumonia.  Bringing up yellow phlegm.  Will start on azithromycin also for possible upper respiratory tract infection.  Severe persistent asthma :Reports that she has asthmatic  attacks every day.  She takes beclomethasone and albuterol inhaler at home.  Also takes Singulair. On discharge, long-acting beta agonist should be added to  inhaled glucocorticoid. She should continue rescue inhaler albuterol.  Nebulizer machine can also be prescribed.  She benefit with outpatient follow-up with pulmonology.  Bipolar disorder: Follows with psychiatry.  Has history of suicidal attempt in the past.  Currently mood is stable.  On Latuda at home  Diabetes type 2: On metformin at home.  Continue sliding scale insulin here.  Check hemoglobin A1c.  Morbid obesity: Weight of 171 kg.  She needs adhere with healthy diet and exercise.   Severity of Illness: The appropriate patient status respiratory this patient is INPATIENT. Has severe asthma exacerbation.  High risk for respiratory arrest.  Needs very close monitoring, IV steroids.  DVT prophylaxis: Lovenox Code Status: Full Family Communication: None present at the bed side Consults called: None     Burnadette PopAmrit Lynda Wanninger MD [facility] Pager [phone]  If 7PM-7AM, please contact night-coverage [email or web address] Password [email or web address]  05/17/2019, 1:12 PM

## 2019-05-17 NOTE — ED Triage Notes (Signed)
Pt called EMS with c/o asthma attack. Cough started 2 days ago with wheezing last night. Denies Fever. Denies N/V/D. Received MSO4 2 gm IV. And Solu-medrol 125 mg IVP. Withheld epi b/o hr 130s.  Audible wheezing.

## 2019-05-18 DIAGNOSIS — J4541 Moderate persistent asthma with (acute) exacerbation: Secondary | ICD-10-CM

## 2019-05-18 LAB — CBC
HCT: 39.6 % (ref 36.0–46.0)
Hemoglobin: 12.7 g/dL (ref 12.0–15.0)
MCH: 30.2 pg (ref 26.0–34.0)
MCHC: 32.1 g/dL (ref 30.0–36.0)
MCV: 94.1 fL (ref 80.0–100.0)
Platelets: 343 10*3/uL (ref 150–400)
RBC: 4.21 MIL/uL (ref 3.87–5.11)
RDW: 13.9 % (ref 11.5–15.5)
WBC: 12.4 10*3/uL — ABNORMAL HIGH (ref 4.0–10.5)
nRBC: 0 % (ref 0.0–0.2)

## 2019-05-18 LAB — BASIC METABOLIC PANEL
Anion gap: 9 (ref 5–15)
BUN: 12 mg/dL (ref 6–20)
CO2: 19 mmol/L — ABNORMAL LOW (ref 22–32)
Calcium: 9 mg/dL (ref 8.9–10.3)
Chloride: 108 mmol/L (ref 98–111)
Creatinine, Ser: 0.56 mg/dL (ref 0.44–1.00)
GFR calc Af Amer: >60 mL/min (ref >60)
GFR calc non Af Amer: >60 mL/min (ref >60)
Glucose, Bld: 151 mg/dL — ABNORMAL HIGH (ref 70–99)
Potassium: 4.4 mmol/L (ref 3.5–5.1)
Sodium: 136 mmol/L (ref 135–145)

## 2019-05-18 LAB — GLUCOSE, CAPILLARY
Glucose-Capillary: 125 mg/dL — ABNORMAL HIGH (ref 70–99)
Glucose-Capillary: 131 mg/dL — ABNORMAL HIGH (ref 70–99)

## 2019-05-18 MED ORDER — ALBUTEROL SULFATE HFA 108 (90 BASE) MCG/ACT IN AERS: 2.0000 | INHALATION_SPRAY | RESPIRATORY_TRACT | 1 refills | Status: DC | PRN

## 2019-05-18 MED ORDER — PREDNISONE 20 MG PO TABS: 40.0000 mg | ORAL_TABLET | Freq: Every day | ORAL | Status: DC

## 2019-05-18 MED ORDER — PREDNISONE 10 MG PO TABS: ORAL_TABLET | ORAL | 0 refills | Status: AC

## 2019-05-18 MED ORDER — MOMETASONE FURO-FORMOTEROL FUM 200-5 MCG/ACT IN AERO: 2.0000 | INHALATION_SPRAY | Freq: Two times a day (BID) | RESPIRATORY_TRACT | 1 refills | Status: DC

## 2019-05-18 MED ORDER — MOMETASONE FURO-FORMOTEROL FUM 200-5 MCG/ACT IN AERO
2.0000 | INHALATION_SPRAY | Freq: Two times a day (BID) | RESPIRATORY_TRACT | Status: DC
  Administered 2019-05-18: 2 via RESPIRATORY_TRACT
  Filled 2019-05-18: qty 8.8

## 2019-05-18 NOTE — TOC Benefit Eligibility Note (Signed)
Transition of Care [facility]) Benefit Eligibility Note    Patient Details  Name: Whitney Beard MRN: [id number] Date of Birth: 2001/04/12    CM consult for cost of Dulera or Advair. Both Dulera inhaler and Advair Diskus are on the formulary for Medicaid, so they would be between $3-5. MD informed.  Lynnell Catalan, RN Phone Number: [phone] 05/18/2019, 8:58 AM

## 2019-05-18 NOTE — Discharge Summary (Signed)
**Note De-Identified vi Obfusction** Physicin Dischrge [place]  Whitney Hiresynn Bundick MVH:[id number] DOB: Oct 16, 2001 DOA: 05/17/2019  PCP: Brooke Pceurhm, Megn, MD  Admit dte: 05/17/2019 Dischrge dte: 05/18/2019  Time spent: 40 minutes  Recommendtions for Outptient Follow-up:  1. Follow outptient CBC/CMP 2. Follow up with outptient provider, consider pulm referrl  3. 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 femle with medicl history significnt of bipolr disorder, severe persistent sthm, morbid obesity, non-insulin-dependent dibetes mellitus, depression who presents from home with complints of shortness of breth.  Ptient hs been dignosed with sthm since she ws  smll child.  She hs been intubted in the pst once.  Ptient sttes she hs sthm ttcks once or twice  dy but those ttcks resolve fter tking her inhlers.  She does  not follow-up with pulmonology nd her primry doctor is mnging her sthm. Her respirtory symptoms strted lst night.  She ws feeling cold.  She strted hving shortness of breth nd ws brely ble to climb the stirs t home.  Her brething tretment did not work nd she presented to the emergency deprtment tody.  Ptient lso reports of productive cough with yellowish sputum. Ptient seen nd exmined the bedside in the emergency deprtment.  She ws found to be tchycrdic but sturting fine on room ir.  She denies ny fever, chills, chest pin, bdominl pin, dysuri, nuse, vomiting, dirrhe or hedche.  ED Course: Chest x-ry did not show pneumoni.  Hd severe bilterl  expirtory wheezes but mintining sturtion on room ir.  Treted with IV steroids nd mgnesium.  She ws dmitted for n sthm excerbtion.  She's improved with scheduled nebs, steroids.  She ws dischrged on 6/18 with djustments in her regimen.   Hospitl Course:  Acute severe Asthm excerbtion: Improved, dischrge with duler insted of qvr Continue lbuterol, singulir D/c with steroid tper  Severe persistent sthm : As bove, consider outptient pulmonology  Bipolr disorder: Follows with psychitry.  Hs history of suicidl ttempt in the pst.  Currently mood is stble.  On Ltud t home  Dibetes type 2: On metformin t home.  Continue sliding scle insulin here.  Check hemoglobin A1c.  Morbid obesity: Weight of 171 kg.  She needs dhere with helthy diet nd exercise.  Procedures:  none  Consulttions:  none  Dischrge Exm: Vitls:   05/18/19 0906 05/18/19 0907  BP:    Pulse:    Resp:    Temp:    SpO2: 98% 100%   Feels better Feels redy for d/c home Discussed with mother, ok with d/c s well Pt notes she'd run out of some meds before her dmission, these were refilled.  Generl: No cute distress. Crdiovsculr: Hert sounds show  regulr rte, nd rhythm Lungs: Cler to usculttion bilterlly Abdomen: Soft, nontender, nondistended  Neurologicl: Alert nd oriented 3. Moves ll extremities 4. Crnil nerves II through XII grossly intct. Skin: Wrm nd dry. No rshes or lesions. Extremities: No clubbing or cynosis. No edem.   Dischrge Instructions   Dischrge Instructions    Cll MD for:  difficulty brething, hedche or visul disturbnces **Note De-Identified vi Obfusction** Complete by: As directed    Cll MD for:  extreme ftigue   Complete by: As directed    Cll MD for:  hives   Complete by: As directed    Cll MD for:  persistnt dizziness or light-hededness   Complete by: As directed    Cll MD for:  persistnt nuse nd vomiting   Complete  by: As directed    Cll MD for:  redness, tenderness, or signs of infection (pin, swelling, redness, odor or green/yellow dischrge round incision site)   Complete by: As directed    Cll MD for:  severe uncontrolled pin   Complete by: As directed    Cll MD for:  temperture >100.4   Complete by: As directed    Diet - low sodium hert helthy   Complete by: As directed    Dischrge instructions   Complete by: As directed    You were seen for n sthm excerbtion.  You improved with steroids nd brething tretments.  I've discontinued your qvr nd strted you on duler s  controller mediction.  Tke this 2 puffs, twice dily to help control your symptoms.  Continue the lbuterol every 4-6 hours s needed for shortness of breth.  I'll lso prescribe  steroid tper.  Continue your singulir.  Plese follow up with your PCP fter dischrge within  few dys.  Return for new, recurrent, or worsening symptoms.  Plese sk your PCP to request records from this hospitliztion so they know wht ws done nd wht the next steps will be.   Increse ctivity slowly   Complete by: As directed      Allergies s of 05/18/2019   No Known Allergies     Mediction List    STOP tking these medictions   beclomethsone 80 MCG/ACT inhler Commonly known s: QVAR     TAKE these medictions   lbuterol 108 (90 Bse) MCG/ACT inhler Commonly known s: VENTOLIN HFA Inhle 2 puffs into the lungs every 4 (four) hours s needed for up to 30 dys. For wheeze or shortness of breth Wht chnged:   resons to tke this  dditionl instructions   hydrOXYzine 10 MG tblet Commonly known s: ATARAX/VISTARIL Tke 1 tblet (10 mg totl) by mouth 3 (three) times dily s needed for nxiety.   lursidone 20 MG Tbs tblet Commonly known s: LATUDA Tke 2 tblets (40 mg totl) by mouth dily with brekfst.   metFORMIN 500 MG tblet Commonly known s: GLUCOPHAGE Tke 1 tblet (500 mg  totl) by mouth dily with brekfst.   mometsone-formoterol 200-5 MCG/ACT Aero Commonly known s: DULERA Inhle 2 puffs into the lungs 2 (two) times dily for 30 dys.   montelukst 10 MG tblet Commonly known s: SINGULAIR Tke 10 mg by mouth t bedtime.   predniSONE 10 MG tblet Commonly known s: DELTASONE Tke 4 tblets (40 mg totl) by mouth dily with brekfst for 3 dys, THEN 3 tblets (30 mg totl) dily with brekfst for 3 dys, THEN 2 tblets (20 mg totl) dily with brekfst for 3 dys, THEN 1 tblet (10 mg totl) dily with brekfst for 3 dys. Strt tking on: May 18, 2019      No Known Allergies    The results of significnt dignostics from this hospitliztion (including imging, microbiology, ncillry nd lbortory) re listed below for reference.    Significnt Dignostic Studies: Dg Chest Port 1 View  Result Dte: 05/17/2019 CLINICAL DATA:  Cough beginning 2 dys go with wheezing **Note De-Identified vi Obfusction** since lst night. EXM: PORTBLE CHEST 1 VIEW COMPRISON:  12/26/2018 FINDINGS: The hert size nd medistinl contours re within norml limits. Both lungs re cler. The visulized skeletl structures re unremrkble. IMPRESSION: No ctive disese. Electroniclly Signed   By: Elbert Fortisniel  Boyle M.D.   On: 05/17/2019 09:24    Microbiology: Recent Results (from the pst 240 hour(s))  SRS Coronvirus 2 (CEPHEID - Performed in [facility]), Hosp Order     Sttus: None   Collection Time: 05/17/19  8:46 M   Specimen: Nsophryngel Swb  Result Vlue Ref Rnge Sttus   SRS Coronvirus 2 NEGTIVE NEGTIVE Finl    Comment: (NOTE) If result is NEGTIVE SRS-CoV-2 trget nucleic cids re NOT DETECTED. The SRS-CoV-2 RN is generlly detectble in upper nd lower  respirtory specimens during the cute phse of infection. The lowest  concentrtion of SRS-CoV-2 virl copies this ssy cn detect is 250  copies / mL.  negtive result does not preclude SRS-CoV-2  infection  nd should not be used s the sole bsis for tretment or other  ptient mngement decisions.   negtive result my occur with  improper specimen collection / hndling, submission of specimen other  thn nsophryngel swb, presence of virl muttion(s) within the  res trgeted by this ssy, nd indequte number of virl copies  (<250 copies / mL).  negtive result must be combined with clinicl  observtions, ptient history, nd epidemiologicl informtion. If result is POSITIVE SRS-CoV-2 trget nucleic cids re DETECTED. The SRS-CoV-2 RN is generlly detectble in upper nd lower  respirtory specimens dur ing the cute phse of infection.  Positive  results re indictive of ctive infection with SRS-CoV-2.  Clinicl  correltion with ptient history nd other dignostic informtion is  necessry to determine ptient infection sttus.  Positive results do  not rule out bcteril infection or co-infection with other viruses. If result is PRESUMPTIVE POSTIVE SRS-CoV-2 nucleic cids MY BE PRESENT.    presumptive positive result ws obtined on the submitted specimen  nd confirmed on repet testing.  While 2019 novel coronvirus  (SRS-CoV-2) nucleic cids my be present in the submitted smple  dditionl confirmtory testing my be necessry for epidemiologicl  nd / or clinicl mngement purposes  to differentite between  SRS-CoV-2 nd other Srbecovirus currently known to infect humns.  If cliniclly indicted dditionl testing with n lternte test  methodology [phone]([id number]) is dvised. The SRS-CoV-2 RN is generlly  detectble in upper nd lower respirtory sp ecimens during the cute  phse of infection. The expected result is Negtive. Fct Sheet for Ptients:  BoilerBrush.com.cyhttps://www.fd.gov/medi/136312/downlod Fct Sheet for [facility] Providers: https://pope.com/https://www.fd.gov/medi/136313/downlod This test is not yet pproved or clered by the [place]  FD nd hs been uthorized for detection nd/or dignosis of SRS-CoV-2 by FD under n Emergency Use uthoriztion (EU).  This EU will remin in effect (mening this test cn be used) for the durtion of the COVID-19 declrtion under Section 564(b)(1) of the ct, 21 U.S.C. section 360bbb-3(b)(1), unless the uthoriztion is terminted or revoked sooner. Performed t [facility], [address] [place], [place] [id number]   MRS PCR Screening     Sttus: None   Collection Time: 05/17/19  3:47 PM   Specimen: Nsl Mucos; Nsophryngel  Result Vlue Ref Rnge Sttus   MRS by PCR NEGTIVE NEGTIVE Finl    Comment:        The GeneXpert MRS ssy (FD pproved for NSL specimens only), is one component of  comprehensive MRS colonization surveillance program. It is not intended to diagnose MRSA infection nor to guide or monitor treatment for MRSA infections. Performed at [facility], [place] [address] [place], [place] [address]      Labs: Basic Metabolic Panel: Recent Labs  Lab 05/17/19 0847 05/18/19 0251  NA 138 136  K 4.1 4.4  CL 108 108  CO2 20* 19*  GLUCOSE 112* 151*  BUN 13 12  CREATININE 0.58 0.56  CALCIUM 8.3* 9.0   Liver Function Tests: No results for input(s): AST, ALT, ALKPHOS, BILITOT, PROT, ALBUMIN in the last 168 hours. No results for input(s): LIPASE, AMYLASE in the last 168 hours. No results for input(s): AMMONIA in the last 168 hours. CBC: Recent Labs  Lab 05/17/19 0847 05/18/19 0251  WBC 8.4 12.4*  NEUTROABS 4.7  --   HGB 12.2 12.7  HCT 37.4 39.6  MCV 93.0 94.1  PLT 328 343   Cardiac Enzymes: No results for input(s): CKTOTAL, CKMB, CKMBINDEX, TROPONINI in the last 168 hours. BNP: BNP (last 3 results) No results for input(s): BNP in the last 8760 hours.  ProBNP (last 3 results) No results for input(s): PROBNP in the last 8760 hours.  CBG: Recent Labs  Lab 05/17/19 1621 05/17/19 1931  05/17/19 2147 05/18/19 0743  GLUCAP 133* 128* 125* 125*       Signed:  Fayrene Helper MD.  [facility] 05/18/2019, 11:00 AM

## 2019-05-18 NOTE — Progress Notes (Signed)
Pt discharged per this RN. IV was removed and pt was educated on post discharge instructions. This RN gave a chance for questions. Pt stated she understood and had no further questions. All belongings were returned. Pt left with a copy of her AVS.

## 2019-11-12 ENCOUNTER — Encounter (HOSPITAL_BASED_OUTPATIENT_CLINIC_OR_DEPARTMENT_OTHER): Payer: Self-pay

## 2019-11-12 ENCOUNTER — Other Ambulatory Visit: Payer: Self-pay

## 2019-11-12 ENCOUNTER — Emergency Department (HOSPITAL_BASED_OUTPATIENT_CLINIC_OR_DEPARTMENT_OTHER): Payer: Medicaid Other

## 2019-11-12 ENCOUNTER — Observation Stay (HOSPITAL_BASED_OUTPATIENT_CLINIC_OR_DEPARTMENT_OTHER)
Admission: EM | Admit: 2019-11-12 | Discharge: 2019-11-13 | Disposition: A | Discharge status: Home / Self Care | Payer: Medicaid Other | Attending: Internal Medicine | Admitting: Internal Medicine

## 2019-11-12 DIAGNOSIS — Z23 Encounter for immunization: Secondary | ICD-10-CM | POA: Insufficient documentation

## 2019-11-12 DIAGNOSIS — E119 Type 2 diabetes mellitus without complications: Secondary | ICD-10-CM | POA: Insufficient documentation

## 2019-11-12 DIAGNOSIS — E1169 Type 2 diabetes mellitus with other specified complication: Secondary | ICD-10-CM | POA: Diagnosis not present

## 2019-11-12 DIAGNOSIS — Z68.41 Body mass index (BMI) pediatric, greater than or equal to 95th percentile for age: Secondary | ICD-10-CM | POA: Insufficient documentation

## 2019-11-12 DIAGNOSIS — F319 Bipolar disorder, unspecified: Secondary | ICD-10-CM | POA: Diagnosis not present

## 2019-11-12 DIAGNOSIS — F419 Anxiety disorder, unspecified: Secondary | ICD-10-CM | POA: Diagnosis not present

## 2019-11-12 DIAGNOSIS — Z20828 Contact with and (suspected) exposure to other viral communicable diseases: Secondary | ICD-10-CM | POA: Insufficient documentation

## 2019-11-12 DIAGNOSIS — Z7951 Long term (current) use of inhaled steroids: Secondary | ICD-10-CM | POA: Diagnosis not present

## 2019-11-12 DIAGNOSIS — J4552 Severe persistent asthma with status asthmaticus: Secondary | ICD-10-CM | POA: Diagnosis not present

## 2019-11-12 DIAGNOSIS — J45902 Unspecified asthma with status asthmaticus: Principal | ICD-10-CM | POA: Diagnosis present

## 2019-11-12 DIAGNOSIS — Z79899 Other long term (current) drug therapy: Secondary | ICD-10-CM | POA: Insufficient documentation

## 2019-11-12 DIAGNOSIS — E669 Obesity, unspecified: Secondary | ICD-10-CM | POA: Diagnosis not present

## 2019-11-12 DIAGNOSIS — G4733 Obstructive sleep apnea (adult) (pediatric): Secondary | ICD-10-CM | POA: Diagnosis not present

## 2019-11-12 DIAGNOSIS — Z7984 Long term (current) use of oral hypoglycemic drugs: Secondary | ICD-10-CM | POA: Diagnosis not present

## 2019-11-12 DIAGNOSIS — R9431 Abnormal electrocardiogram [ECG] [EKG]: Secondary | ICD-10-CM | POA: Insufficient documentation

## 2019-11-12 DIAGNOSIS — J45901 Unspecified asthma with (acute) exacerbation: Secondary | ICD-10-CM | POA: Diagnosis present

## 2019-11-12 DIAGNOSIS — R0602 Shortness of breath: Secondary | ICD-10-CM | POA: Diagnosis present

## 2019-11-12 LAB — CBC WITH DIFFERENTIAL/PLATELET
Abs Immature Granulocytes: 0.02 10*3/uL (ref 0.00–0.07)
Basophils Absolute: 0.1 10*3/uL (ref 0.0–0.1)
Basophils Relative: 1 %
Eosinophils Absolute: 0.2 10*3/uL (ref 0.0–0.5)
Eosinophils Relative: 2 %
HCT: 38.9 % (ref 36.0–46.0)
Hemoglobin: 12.8 g/dL (ref 12.0–15.0)
Immature Granulocytes: 0 %
Lymphocytes Relative: 25 %
Lymphs Abs: 2.2 10*3/uL (ref 0.7–4.0)
MCH: 29.8 pg (ref 26.0–34.0)
MCHC: 32.9 g/dL (ref 30.0–36.0)
MCV: 90.7 fL (ref 80.0–100.0)
Monocytes Absolute: 0.6 10*3/uL (ref 0.1–1.0)
Monocytes Relative: 7 %
Neutro Abs: 5.7 10*3/uL (ref 1.7–7.7)
Neutrophils Relative %: 65 %
Platelets: 340 10*3/uL (ref 150–400)
RBC: 4.29 MIL/uL (ref 3.87–5.11)
RDW: 13.2 % (ref 11.5–15.5)
WBC: 8.7 10*3/uL (ref 4.0–10.5)
nRBC: 0 % (ref 0.0–0.2)

## 2019-11-12 LAB — COMPREHENSIVE METABOLIC PANEL
ALT: 16 U/L (ref 0–44)
AST: 17 U/L (ref 15–41)
Albumin: 3.9 g/dL (ref 3.5–5.0)
Alkaline Phosphatase: 78 U/L (ref 38–126)
Anion gap: 11 (ref 5–15)
BUN: 9 mg/dL (ref 6–20)
CO2: 20 mmol/L — ABNORMAL LOW (ref 22–32)
Calcium: 8.4 mg/dL — ABNORMAL LOW (ref 8.9–10.3)
Chloride: 108 mmol/L (ref 98–111)
Creatinine, Ser: 0.85 mg/dL (ref 0.44–1.00)
GFR calc Af Amer: >60 mL/min (ref >60)
GFR calc non Af Amer: >60 mL/min (ref >60)
Glucose, Bld: 133 mg/dL — ABNORMAL HIGH (ref 70–99)
Potassium: 3.1 mmol/L — ABNORMAL LOW (ref 3.5–5.1)
Sodium: 139 mmol/L (ref 135–145)
Total Bilirubin: 0.6 mg/dL (ref 0.3–1.2)
Total Protein: 7.5 g/dL (ref 6.5–8.1)

## 2019-11-12 LAB — GLUCOSE, CAPILLARY: Glucose-Capillary: 134 mg/dL — ABNORMAL HIGH (ref 70–99)

## 2019-11-12 LAB — ACETAMINOPHEN LEVEL: Acetaminophen (Tylenol), Serum: <10 ug/mL — ABNORMAL LOW (ref 10–30)

## 2019-11-12 LAB — TROPONIN I (HIGH SENSITIVITY)
Troponin I (High Sensitivity): <2 ng/L (ref <18)
Troponin I (High Sensitivity): <2 ng/L (ref <18)

## 2019-11-12 LAB — SALICYLATE LEVEL: Salicylate Lvl: <7 mg/dL (ref 2.8–30.0)

## 2019-11-12 LAB — HCG, QUANTITATIVE, PREGNANCY: hCG, Beta Chain, Quant, S: <1 m[IU]/mL (ref <5)

## 2019-11-12 LAB — SARS CORONAVIRUS 2 AG (30 MIN TAT): SARS Coronavirus 2 Ag: NEGATIVE

## 2019-11-12 LAB — SARS CORONAVIRUS 2 (TAT 6-24 HRS): SARS Coronavirus 2: NEGATIVE

## 2019-11-12 LAB — ETHANOL: Alcohol, Ethyl (B): <10 mg/dL (ref <10)

## 2019-11-12 MED ORDER — ACETAMINOPHEN 325 MG PO TABS: 650.0000 mg | ORAL_TABLET | Freq: Four times a day (QID) | ORAL | Status: DC | PRN

## 2019-11-12 MED ORDER — IPRATROPIUM BROMIDE 0.02 % IN SOLN
0.5000 mg | Freq: Once | RESPIRATORY_TRACT | Status: AC
  Administered 2019-11-12: 0.5 mg via RESPIRATORY_TRACT
  Filled 2019-11-12: qty 2.5

## 2019-11-12 MED ORDER — ONDANSETRON HCL 4 MG PO TABS: 4.0000 mg | ORAL_TABLET | Freq: Four times a day (QID) | ORAL | Status: DC | PRN

## 2019-11-12 MED ORDER — ACETAMINOPHEN 500 MG PO TABS
1000.0000 mg | ORAL_TABLET | Freq: Once | ORAL | Status: AC
  Administered 2019-11-12: 1000 mg via ORAL

## 2019-11-12 MED ORDER — MONTELUKAST SODIUM 10 MG PO TABS
10.0000 mg | ORAL_TABLET | Freq: Every day | ORAL | Status: DC
  Administered 2019-11-12: 10 mg via ORAL
  Filled 2019-11-12: qty 1

## 2019-11-12 MED ORDER — INSULIN ASPART 100 UNIT/ML ~~LOC~~ SOLN: 0.0000 [IU] | Freq: Every day | SUBCUTANEOUS | Status: DC

## 2019-11-12 MED ORDER — ALBUTEROL SULFATE HFA 108 (90 BASE) MCG/ACT IN AERS
8.0000 | INHALATION_SPRAY | Freq: Once | RESPIRATORY_TRACT | Status: AC
  Administered 2019-11-12: 04:00:00 8 via RESPIRATORY_TRACT

## 2019-11-12 MED ORDER — INSULIN ASPART 100 UNIT/ML ~~LOC~~ SOLN: 0.0000 [IU] | Freq: Three times a day (TID) | SUBCUTANEOUS | Status: DC

## 2019-11-12 MED ORDER — LURASIDONE HCL 20 MG PO TABS
40.0000 mg | ORAL_TABLET | Freq: Every day | ORAL | Status: DC
  Administered 2019-11-13: 40 mg via ORAL
  Filled 2019-11-12: qty 2

## 2019-11-12 MED ORDER — INFLUENZA VAC SPLIT QUAD 0.5 ML IM SUSY
0.5000 mL | PREFILLED_SYRINGE | INTRAMUSCULAR | Status: AC
  Administered 2019-11-13: 0.5 mL via INTRAMUSCULAR
  Filled 2019-11-12: qty 0.5

## 2019-11-12 MED ORDER — ACETAMINOPHEN 650 MG RE SUPP: 650.0000 mg | Freq: Four times a day (QID) | RECTAL | Status: DC | PRN

## 2019-11-12 MED ORDER — IPRATROPIUM-ALBUTEROL 0.5-2.5 (3) MG/3ML IN SOLN
RESPIRATORY_TRACT | Status: AC
  Administered 2019-11-12: 3 mL via RESPIRATORY_TRACT
  Filled 2019-11-12: qty 3

## 2019-11-12 MED ORDER — IPRATROPIUM BROMIDE HFA 17 MCG/ACT IN AERS
4.0000 | INHALATION_SPRAY | Freq: Once | RESPIRATORY_TRACT | Status: AC
  Administered 2019-11-12: 4 via RESPIRATORY_TRACT

## 2019-11-12 MED ORDER — ALBUTEROL (5 MG/ML) CONTINUOUS INHALATION SOLN
10.0000 mg/h | INHALATION_SOLUTION | RESPIRATORY_TRACT | Status: DC
  Administered 2019-11-12: 10 mg/h via RESPIRATORY_TRACT
  Filled 2019-11-12: qty 20

## 2019-11-12 MED ORDER — IPRATROPIUM BROMIDE HFA 17 MCG/ACT IN AERS
2.0000 | INHALATION_SPRAY | Freq: Once | RESPIRATORY_TRACT | Status: AC
  Administered 2019-11-12: 2 via RESPIRATORY_TRACT

## 2019-11-12 MED ORDER — EPINEPHRINE 0.3 MG/0.3ML IJ SOAJ
0.3000 mg | Freq: Once | INTRAMUSCULAR | Status: AC
  Administered 2019-11-12: 0.3 mg via INTRAMUSCULAR
  Filled 2019-11-12: qty 0.3

## 2019-11-12 MED ORDER — HYDROXYZINE HCL 10 MG PO TABS
10.0000 mg | ORAL_TABLET | Freq: Three times a day (TID) | ORAL | Status: DC | PRN
  Filled 2019-11-12: qty 1

## 2019-11-12 MED ORDER — ACETAMINOPHEN 500 MG PO TABS
ORAL_TABLET | ORAL | Status: AC
  Filled 2019-11-12: qty 2

## 2019-11-12 MED ORDER — ALBUTEROL SULFATE HFA 108 (90 BASE) MCG/ACT IN AERS
8.0000 | INHALATION_SPRAY | Freq: Once | RESPIRATORY_TRACT | Status: AC
  Administered 2019-11-12: 8 via RESPIRATORY_TRACT

## 2019-11-12 MED ORDER — ALBUTEROL SULFATE HFA 108 (90 BASE) MCG/ACT IN AERS
2.0000 | INHALATION_SPRAY | Freq: Once | RESPIRATORY_TRACT | Status: AC
  Administered 2019-11-12: 2 via RESPIRATORY_TRACT

## 2019-11-12 MED ORDER — IOHEXOL 350 MG/ML SOLN
100.0000 mL | Freq: Once | INTRAVENOUS | Status: AC | PRN
  Administered 2019-11-12: 100 mL via INTRAVENOUS

## 2019-11-12 MED ORDER — ALBUTEROL SULFATE HFA 108 (90 BASE) MCG/ACT IN AERS: 2.0000 | INHALATION_SPRAY | RESPIRATORY_TRACT | Status: DC | PRN

## 2019-11-12 MED ORDER — POTASSIUM CHLORIDE CRYS ER 20 MEQ PO TBCR
40.0000 meq | EXTENDED_RELEASE_TABLET | Freq: Once | ORAL | Status: AC
  Administered 2019-11-12: 09:00:00 40 meq via ORAL
  Filled 2019-11-12: qty 2

## 2019-11-12 MED ORDER — PREDNISONE 20 MG PO TABS
40.0000 mg | ORAL_TABLET | Freq: Every day | ORAL | Status: DC
  Administered 2019-11-13: 40 mg via ORAL
  Filled 2019-11-12: qty 2

## 2019-11-12 MED ORDER — MOMETASONE FURO-FORMOTEROL FUM 200-5 MCG/ACT IN AERO
2.0000 | INHALATION_SPRAY | Freq: Two times a day (BID) | RESPIRATORY_TRACT | Status: DC
  Administered 2019-11-12 – 2019-11-13 (×2): 2 via RESPIRATORY_TRACT
  Filled 2019-11-12: qty 8.8

## 2019-11-12 MED ORDER — IBUPROFEN 200 MG PO TABS: 200.0000 mg | ORAL_TABLET | Freq: Four times a day (QID) | ORAL | Status: DC | PRN

## 2019-11-12 MED ORDER — ENOXAPARIN SODIUM 40 MG/0.4ML ~~LOC~~ SOLN
40.0000 mg | SUBCUTANEOUS | Status: DC
  Administered 2019-11-12: 40 mg via SUBCUTANEOUS
  Filled 2019-11-12: qty 0.4

## 2019-11-12 MED ORDER — ALBUTEROL (5 MG/ML) CONTINUOUS INHALATION SOLN
15.0000 mg/h | INHALATION_SOLUTION | RESPIRATORY_TRACT | Status: DC
  Administered 2019-11-12 (×2): 15 mg/h via RESPIRATORY_TRACT
  Filled 2019-11-12: qty 20

## 2019-11-12 MED ORDER — ALBUTEROL SULFATE HFA 108 (90 BASE) MCG/ACT IN AERS
2.0000 | INHALATION_SPRAY | RESPIRATORY_TRACT | Status: DC
  Administered 2019-11-12: 2 via RESPIRATORY_TRACT
  Filled 2019-11-12: qty 6.7

## 2019-11-12 MED ORDER — ONDANSETRON HCL 4 MG/2ML IJ SOLN: 4.0000 mg | Freq: Four times a day (QID) | INTRAMUSCULAR | Status: DC | PRN

## 2019-11-12 MED ORDER — IPRATROPIUM-ALBUTEROL 0.5-2.5 (3) MG/3ML IN SOLN
3.0000 mL | Freq: Four times a day (QID) | RESPIRATORY_TRACT | Status: DC
  Administered 2019-11-13 (×3): 3 mL via RESPIRATORY_TRACT
  Filled 2019-11-12 (×3): qty 3

## 2019-11-12 NOTE — ED Notes (Signed)
Pt mother Meredith Mody updated with plan of care per request of patient

## 2019-11-12 NOTE — H&P (Signed)
History and Physical    Whitney Beard WUJ:[id number] DOB: 05-24-2001 DOA: 11/12/2019  PCP: Brooke Paceurham, Megan, MD  Patient coming from: Home  I have personally briefly reviewed patient's [facility]  Chief Complaint: Asthma exacerbation  HPI: Whitney Beard is a 18 y.o. female with medical history significant of BPD1, DM2, morbid obesity, OSA not on CPAP, severe persistent asthma.  Patient with multiple admits this year for asthma exacerbations, last in June.  [facility] with Acute onset wheezing last night.  Severe SOB.   ED Course: Got solumedrol with EMS this morning, epinephrine, continuous neb treatments.  Initially accepted to SDU this AM.  COVID would come back negative this evening.  Symptoms have been improving throughout day, so eventually downgraded to med-surg bed and arrives this evening.  CTA chest also neg for PE or PNA.   Review of Systems: As per HPI, otherwise all review of systems negative.  Past Medical History:  Diagnosis Date  . ADHD (attention deficit hyperactivity disorder)   . Allergic rhinitis   . Asthma   . Bipolar 1 disorder (HCC)   . Diabetes mellitus without complication (HCC)    states today 03/09/16 "was told in the past that she was borderline diabetic"  . Obesity   . Sleep apnea   . Suicide attempt by drug ingestion [facility](HCC)     Past Surgical History:  Procedure Laterality Date  . ADENOIDECTOMY    . TONSILLECTOMY       reports that she has never smoked. She has never used smokeless tobacco. She reports that she does not drink alcohol or use drugs.  No Known Allergies  Family History  Problem Relation Age of Onset  . Bipolar disorder Father   . Schizophrenia Father   . Bipolar disorder Maternal Grandmother   . SIDS Maternal Aunt   . Heart disease Paternal Grandmother      Prior to Admission medications   Medication Sig Start Date End Date Taking? Authorizing Provider  albuterol (VENTOLIN  HFA) 108 (90 Base) MCG/ACT inhaler Inhale 2 puffs into the lungs every 4 (four) hours as needed for up to 30 days. For wheeze or shortness of breath Patient taking differently: Inhale 2 puffs into the lungs every 4 (four) hours as needed for wheezing or shortness of breath. For wheeze or shortness of breath  05/18/19 11/12/19 Yes Zigmund DanielPowell, A Caldwell Jr., MD  hydrOXYzine (ATARAX/VISTARIL) 10 MG tablet Take 1 tablet (10 mg total) by mouth 3 (three) times daily as needed for anxiety. 01/19/18  Yes Denzil Magnusonhomas, Lashunda, NP  ibuprofen (ADVIL) 200 MG tablet Take 200 mg by mouth every 6 (six) hours as needed for cramping.   Yes [provider]  lurasidone (LATUDA) 20 MG TABS tablet Take 2 tablets (40 mg total) by mouth daily with breakfast. 03/17/19  Yes Leata MouseJonnalagadda, Janardhana, MD  metFORMIN (GLUCOPHAGE) 500 MG tablet Take 1 tablet (500 mg total) by mouth daily with breakfast. 01/19/18  Yes Denzil Magnusonhomas, Lashunda, NP  mometasone-formoterol (DULERA) 200-5 MCG/ACT AERO Inhale 2 puffs into the lungs 2 (two) times daily for 30 days. Patient taking differently: Inhale 2 puffs into the lungs every other day.  05/18/19 11/12/19 Yes Zigmund DanielPowell, A Caldwell Jr., MD  montelukast (SINGULAIR) 10 MG tablet Take 10 mg by mouth at bedtime.   Yes [provider]    Physical Exam: Vitals:   11/12/19 1630 11/12/19 1700 11/12/19 1737 11/12/19 1831  BP: 133/87 130/70  (!) 154/84  Pulse: 97 98  71  Resp: (!) 21 16  20   Temp:   98 F (36.7 C) 98.3 F (36.8 C)  TempSrc:   Oral Oral  SpO2: 98% 99%  100%  Weight:      Height:        Constitutional: NAD, calm, comfortable Eyes: PERRL, lids and conjunctivae normal ENMT: Mucous membranes are moist. Posterior pharynx clear of any exudate or lesions.Normal dentition.  Neck: normal, supple, no masses, no thyromegaly Respiratory: Wheezing present. Cardiovascular: Regular rate and rhythm, no murmurs / rubs / gallops. No extremity edema. 2+ pedal pulses. No carotid bruits.    Abdomen: no tenderness, no masses palpated. No hepatosplenomegaly. Bowel sounds positive.  Musculoskeletal: no clubbing / cyanosis. No joint deformity upper and lower extremities. Good ROM, no contractures. Normal muscle tone.  Skin: no rashes, lesions, ulcers. No induration Neurologic: CN 2-12 grossly intact. Sensation intact, DTR normal. Strength 5/5 in all 4.  Psychiatric: Normal judgment and insight. Alert and oriented x 3. Normal mood.    Labs on Admission: I have personally reviewed following labs and imaging studies  CBC: Recent Labs  Lab 11/12/19 0342  WBC 8.7  NEUTROABS 5.7  HGB 12.8  HCT 38.9  MCV 90.7  PLT 782   Basic Metabolic Panel: Recent Labs  Lab 11/12/19 0342  NA 139  K 3.1*  CL 108  CO2 20*  GLUCOSE 133*  BUN 9  CREATININE 0.85  CALCIUM 8.4*   GFR: Estimated Creatinine Clearance: 173.4 mL/min (by C-G formula based on SCr of 0.85 mg/dL). Liver Function Tests: Recent Labs  Lab 11/12/19 0342  AST 17  ALT 16  ALKPHOS 78  BILITOT 0.6  PROT 7.5  ALBUMIN 3.9   No results for input(s): LIPASE, AMYLASE in the last 168 hours. No results for input(s): AMMONIA in the last 168 hours. Coagulation Profile: No results for input(s): INR, PROTIME in the last 168 hours. Cardiac Enzymes: No results for input(s): CKTOTAL, CKMB, CKMBINDEX, TROPONINI in the last 168 hours. BNP (last 3 results) No results for input(s): PROBNP in the last 8760 hours. HbA1C: No results for input(s): HGBA1C in the last 72 hours. CBG: No results for input(s): GLUCAP in the last 168 hours. Lipid Profile: No results for input(s): CHOL, HDL, LDLCALC, TRIG, CHOLHDL, LDLDIRECT in the last 72 hours. Thyroid Function Tests: No results for input(s): TSH, T4TOTAL, FREET4, T3FREE, THYROIDAB in the last 72 hours. Anemia Panel: No results for input(s): VITAMINB12, FOLATE, FERRITIN, TIBC, IRON, RETICCTPCT in the last 72 hours. Urine analysis:    Component Value Date/Time   COLORURINE  YELLOW 01/24/2017 1140   APPEARANCEUR HAZY (A) 01/24/2017 1140   LABSPEC 1.025 01/24/2017 1140   PHURINE 5.0 01/24/2017 1140   GLUCOSEU NEGATIVE 01/24/2017 1140   HGBUR NEGATIVE 01/24/2017 1140   BILIRUBINUR NEGATIVE 01/24/2017 1140   KETONESUR NEGATIVE 01/24/2017 1140   PROTEINUR NEGATIVE 01/24/2017 1140   UROBILINOGEN 0.2 01/08/2013 2339   NITRITE NEGATIVE 01/24/2017 1140   LEUKOCYTESUR NEGATIVE 01/24/2017 1140    Radiological Exams on Admission: CT Angio Chest PE W and/or Wo Contrast  Result Date: 11/12/2019 CLINICAL DATA:  Short of breath, asthma. Evaluate for pulmonary embolism. EXAM: CT ANGIOGRAPHY CHEST WITH CONTRAST TECHNIQUE: Multidetector CT imaging of the chest was performed using the standard protocol during bolus administration of intravenous contrast. Multiplanar CT image reconstructions and MIPs were obtained to evaluate the vascular anatomy. CONTRAST:  153mL OMNIPAQUE IOHEXOL 350 MG/ML SOLN COMPARISON:  None. FINDINGS: Cardiovascular: Exam is somewhat limited  sensitivity due to the effect of large body habitus on the images. No discrete filling defects within pulmonary arteries to suggest acute pulmonary embolism. No pericardial effusion.  No acute findings aorta great vessels. Mediastinum/Nodes: No axillary supraclavicular adenopathy. No mediastinal hilar adenopathy. Esophagus normal. Small amount of residual thymus in the anterior mediastinum Lungs/Pleura: Mild ground-glass densities anteromedially in the lingula and RIGHT middle lobe. No pulmonary infarction. No pneumonia. No pleural fluid. Upper Abdomen: Limited view of the liver, kidneys, pancreas are unremarkable. Normal adrenal glands. Musculoskeletal: No acute osseous abnormality. Review of the MIP images confirms the above findings. IMPRESSION: 1. No evidence acute pulmonary embolism in exam limited in sensitivity by body habitus. 2. No pulmonary infarction or pneumonia. Mild atelectasis in the anteromedial RIGHT middle  lobe and lingula. Electronically Signed   By: Genevive Bi M.D.   On: 11/12/2019 07:31   DG Chest Portable 1 View  Result Date: 11/12/2019 CLINICAL DATA:  Shortness of breath EXAM: PORTABLE CHEST 1 VIEW COMPARISON:  May 17, 2019 FINDINGS: The heart size is normal. There is no pneumothorax. There are streaky bibasilar airspace opacities, greatest at the left lung base. The lungs appear to be slightly hyperexpanded. There is no significant pleural effusion. No acute osseous abnormality. IMPRESSION: 1. Streaky bibasilar airspace opacities, greatest at the left lung base, suspicious for pneumonia or atelectasis. 2. Findings suggest underlying emphysema. Electronically Signed   By: Katherine Mantle M.D.   On: 11/12/2019 03:53    EKG: Independently reviewed.  Assessment/Plan Principal Problem:   Status asthmaticus Active Problems:   Diabetes mellitus type 2 in obese (HCC)    1. Status asthmaticus / severe persistent asthma with exacerbation - 1. Improving 2. Adult wheeze protocol 3. PRN albuterol 4. Scheduled duoneb 5. Start prednisone in AM (got solumedrol earlier today) 6. COVID negative 7. CTA chest neg 2. DM2 - 1. Hold metformin 2. Mod scale SSI AC/HS 3. BPD1 - continue latuda  DVT prophylaxis: Lovenox Code Status: Full Family Communication: No family in room Disposition Plan: Home after admit Consults called: None Admission status: Place in 58    Mauria Asquith M. DO [facility]  How to contact the [facility] Attending or Consulting provider 7A - 7P or covering provider during after hours 7P -7A, for this patient?  1. Check the care team in [facility] and look for a) attending/consulting [facility] provider listed and b) the [facility] team listed 2. Log into [email or web address]  Amion Physician Scheduling and messaging for groups and whole hospitals  On call and physician scheduling software for group practices, residents, hospitalists and other medical providers for call, clinic, rotation  and shift schedules. OnCall Enterprise is a hospital-wide system for scheduling doctors and paging doctors on call. EasyPlot is for scientific plotting and data analysis.  [email or web address]  and use [facility]'s universal password to access. If you do not have the password, please contact the hospital operator.  3. Locate the [facility] provider you are looking for under [facility] and page to a number that you can be directly reached. 4. If you still have difficulty reaching the provider, please page the [facility] (Director on Call) for the Hospitalists listed on amion for assistance.  11/12/2019, 7:52 PM

## 2019-11-12 NOTE — ED Notes (Signed)
Report given to [employer], receiving nurse at [employer]

## 2019-11-12 NOTE — ED Provider Notes (Signed)
[facility] EMERGENCY DEPARTMENT Provider Note   CSN: [id number] Arrival date & time: 11/12/19  0320     History No chief complaint on file.   Whitney Beard is a 18 y.o. female.  The history is provided by the patient and the EMS personnel. The history is limited by the condition of the patient.  Wheezing Severity:  Severe Severity compared to prior episodes:  Similar Onset quality:  Gradual Duration:  5 hours Timing:  Constant Progression:  Unchanged Chronicity:  Recurrent Context: not animal exposure, not dust, not emotional upset and not exercise   Relieved by:  Nothing Worsened by:  Nothing Ineffective treatments:  None tried Associated symptoms: shortness of breath   Associated symptoms: no chest pain, no chest tightness, no ear pain, no fatigue, no fever, no foot swelling, no headaches, no orthopnea, no PND, no rash, no rhinorrhea, no sore throat, no sputum production, no stridor and no swollen glands   Risk factors: not exposed to toxic fumes   No anosmia, no covid exposures.  Started at rest at 830 pm.       Past Medical History:  Diagnosis Date  . ADHD (attention deficit hyperactivity disorder)   . Allergic rhinitis   . Asthma   . Bipolar 1 disorder ([place])   . Diabetes mellitus without complication ([place])    states today 03/09/16 "was told in the past that she was borderline diabetic"  . Obesity   . Sleep apnea   . Suicide attempt by drug ingestion [facility])     Patient Active Problem List   Diagnosis Date Noted  . Asthma 05/17/2019  . Diabetes mellitus type 2 in obese ([place]) 03/09/2019  . Suicide attempt ([place])   . Hypokalemia 12/26/2018  . OSA (obstructive sleep apnea) 12/26/2018  . Morbid obesity with BMI of 50.0-59.9, adult ([place]) 12/26/2018  . Asthma exacerbation 10/16/2017  . Status asthmaticus 01/19/2017  . 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 MDD (major depressive disorder), recurrent severe, without psychosis ([place]) 03/12/2016  .  Overdose, intentional self-harm, initial encounter ([place])   . Affective psychosis, bipolar ([place])     Past Surgical History:  Procedure Laterality Date  . ADENOIDECTOMY    . TONSILLECTOMY       OB History   No obstetric history on file.     Family History  Problem Relation Age of Onset  . Bipolar disorder Father   . Schizophrenia Father   . Bipolar disorder Maternal Grandmother   . SIDS Maternal Aunt   . Heart disease Paternal Grandmother     Social History   Tobacco Use  . Smoking status: Never Smoker  . Smokeless tobacco: Never Used  Substance Use Topics  . Alcohol use: No  . Drug use: No    Home Medications Prior to Admission medications   Medication Sig Start Date End Date Taking? Authorizing Provider  albuterol (VENTOLIN HFA) 108 (90 Base) MCG/ACT inhaler Inhale 2 puffs into the lungs every 4 (four) hours as needed for up to 30 days. For wheeze or shortness of breath 05/18/19 06/17/19  Elodia Florence., MD  hydrOXYzine (ATARAX/VISTARIL) 10 MG tablet Take 1 tablet (10 mg total) by mouth 3 (three) times daily as needed for anxiety. 01/19/18   Mordecai Maes, NP  lurasidone (LATUDA) 20 MG TABS tablet Take 2 tablets (40 mg total) by mouth daily with breakfast. 03/17/19   Ambrose Finland, MD  metFORMIN (GLUCOPHAGE) 500 MG tablet Take 1 tablet (500 mg total) by mouth daily  with breakfast. 01/19/18   Mordecai Maes, NP  mometasone-formoterol [facility]) 200-5 MCG/ACT AERO Inhale 2 puffs into the lungs 2 (two) times daily for 30 days. 05/18/19 06/17/19  Elodia Florence., MD  montelukast (SINGULAIR) 10 MG tablet Take 10 mg by mouth at bedtime.    [provider]    Allergies    Patient has no known allergies.  Review of Systems   Review of Systems  Unable to perform ROS: Acuity of condition  Constitutional: Negative for fatigue and fever.  HENT: Negative for ear pain, rhinorrhea and sore throat.   Respiratory: Positive for shortness of breath and  wheezing. Negative for sputum production, chest tightness and stridor.   Cardiovascular: Negative for chest pain, orthopnea and PND.  Gastrointestinal: Negative for abdominal pain and diarrhea.  Genitourinary: Negative for difficulty urinating.  Skin: Negative for rash.  Neurological: Negative for headaches.  Psychiatric/Behavioral: Negative for agitation.    Physical Exam Updated Vital Signs BP (!) 142/87   Pulse (!) 121   Temp 98.1 F (36.7 C)   Resp (!) 25   Ht 5' 5"  (1.651 m)   Wt (!) 172.4 kg   LMP 11/12/2019 Comment: neg preg test 11/12/19  SpO2 100%   BMI 63.24 kg/m   Physical Exam Vitals and nursing note reviewed.  Constitutional:      Appearance: She is not diaphoretic.  HENT:     Head: Normocephalic and atraumatic.     Nose: Nose normal.  Eyes:     Conjunctiva/sclera: Conjunctivae normal.     Pupils: Pupils are equal, round, and reactive to light.  Cardiovascular:     Rate and Rhythm: Regular rhythm. Tachycardia present.     Pulses: Normal pulses.     Heart sounds: Normal heart sounds.  Pulmonary:     Effort: Tachypnea present.     Breath sounds: Wheezing present.  Abdominal:     General: Abdomen is flat. Bowel sounds are normal.     Tenderness: There is no abdominal tenderness. There is no guarding or rebound.  Musculoskeletal:        General: Normal range of motion.     Cervical back: Normal range of motion and neck supple.  Skin:    General: Skin is warm and dry.     Capillary Refill: Capillary refill takes less than 2 seconds.  Neurological:     General: No focal deficit present.     Mental Status: She is alert and oriented to person, place, and time.     Deep Tendon Reflexes: Reflexes normal.  Psychiatric:        Mood and Affect: Mood normal.        Behavior: Behavior normal.     ED Results / Procedures / Treatments   Labs (all labs ordered are listed, but only abnormal results are displayed) Results for orders placed or performed during  the hospital encounter of 11/12/19  SARS Coronavirus 2 Ag (30 min TAT) - Nasal Swab (BD Veritor Kit)   Specimen: Nasal Swab (BD Veritor Kit)  Result Value Ref Range   SARS Coronavirus 2 Ag NEGATIVE NEGATIVE  Comprehensive metabolic panel  Result Value Ref Range   Sodium 139 135 - 145 mmol/L   Potassium 3.1 (L) 3.5 - 5.1 mmol/L   Chloride 108 98 - 111 mmol/L   CO2 20 (L) 22 - 32 mmol/L   Glucose, Bld 133 (H) 70 - 99 mg/dL   BUN 9 6 - 20 mg/dL   Creatinine, Ser  0.85 0.44 - 1.00 mg/dL   Calcium 8.4 (L) 8.9 - 10.3 mg/dL   Total Protein 7.5 6.5 - 8.1 g/dL   Albumin 3.9 3.5 - 5.0 g/dL   AST 17 15 - 41 U/L   ALT 16 0 - 44 U/L   Alkaline Phosphatase 78 38 - 126 U/L   Total Bilirubin 0.6 0.3 - 1.2 mg/dL   GFR calc non Af Amer >60 >60 mL/min   GFR calc Af Amer >60 >60 mL/min   Anion gap 11 5 - 15  Salicylate level  Result Value Ref Range   Salicylate Lvl <8.7 2.8 - 30.0 mg/dL  Acetaminophen level  Result Value Ref Range   Acetaminophen (Tylenol), Serum <10 (L) 10 - 30 ug/mL  Ethanol  Result Value Ref Range   Alcohol, Ethyl (B) <10 <10 mg/dL  CBC WITH DIFFERENTIAL  Result Value Ref Range   WBC 8.7 4.0 - 10.5 K/uL   RBC 4.29 3.87 - 5.11 MIL/uL   Hemoglobin 12.8 12.0 - 15.0 g/dL   HCT 38.9 36.0 - 46.0 %   MCV 90.7 80.0 - 100.0 fL   MCH 29.8 26.0 - 34.0 pg   MCHC 32.9 30.0 - 36.0 g/dL   RDW 13.2 11.5 - 15.5 %   Platelets 340 150 - 400 K/uL   nRBC 0.0 0.0 - 0.2 %   Neutrophils Relative % 65 %   Neutro Abs 5.7 1.7 - 7.7 K/uL   Lymphocytes Relative 25 %   Lymphs Abs 2.2 0.7 - 4.0 K/uL   Monocytes Relative 7 %   Monocytes Absolute 0.6 0.1 - 1.0 K/uL   Eosinophils Relative 2 %   Eosinophils Absolute 0.2 0.0 - 0.5 K/uL   Basophils Relative 1 %   Basophils Absolute 0.1 0.0 - 0.1 K/uL   Immature Granulocytes 0 %   Abs Immature Granulocytes 0.02 0.00 - 0.07 K/uL  hCG, quantitative, pregnancy  Result Value Ref Range   hCG, Beta Chain, Quant, S <1 <5 mIU/mL  Troponin I (High  Sensitivity)  Result Value Ref Range   Troponin I (High Sensitivity) <2 <18 ng/L   DG Chest Portable 1 View  Result Date: 11/12/2019 CLINICAL DATA:  Shortness of breath EXAM: PORTABLE CHEST 1 VIEW COMPARISON:  May 17, 2019 FINDINGS: The heart size is normal. There is no pneumothorax. There are streaky bibasilar airspace opacities, greatest at the left lung base. The lungs appear to be slightly hyperexpanded. There is no significant pleural effusion. No acute osseous abnormality. IMPRESSION: 1. Streaky bibasilar airspace opacities, greatest at the left lung base, suspicious for pneumonia or atelectasis. 2. Findings suggest underlying emphysema. Electronically Signed   By: Constance Holster M.D.   On: 11/12/2019 03:53    EKG EKG Interpretation  Date/Time:  Sunday November 12 2019 03:41:58 EST Ventricular Rate:  143 PR Interval:    QRS Duration: 91 QT Interval:  318 QTC Calculation: 491 R Axis:   84 Text Interpretation: Sinus tachycardia Repol abnrm suggests ischemia, diffuse leads Confirmed by Randal Buba, Peyson Postema ([id number]) on 11/12/2019 3:45:39 AM   Radiology DG Chest Portable 1 View  Result Date: 11/12/2019 CLINICAL DATA:  Shortness of breath EXAM: PORTABLE CHEST 1 VIEW COMPARISON:  May 17, 2019 FINDINGS: The heart size is normal. There is no pneumothorax. There are streaky bibasilar airspace opacities, greatest at the left lung base. The lungs appear to be slightly hyperexpanded. There is no significant pleural effusion. No acute osseous abnormality. IMPRESSION: 1. Streaky bibasilar airspace opacities, greatest at the left  lung base, suspicious for pneumonia or atelectasis. 2. Findings suggest underlying emphysema. Electronically Signed   By: Constance Holster M.D.   On: 11/12/2019 03:53    Procedures Procedures (including critical care time)  Medications Ordered in ED Medications  albuterol (PROVENTIL,VENTOLIN) solution continuous neb (10 mg/hr Nebulization New Bag/Given 11/12/19  0448)  iohexol (OMNIPAQUE) 350 MG/ML injection 100 mL (has no administration in time range)  albuterol (VENTOLIN HFA) 108 (90 Base) MCG/ACT inhaler 8 puff (8 puffs Inhalation Given 11/12/19 0346)  ipratropium (ATROVENT HFA) inhaler 4 puff (4 puffs Inhalation Given 11/12/19 0346)  EPINEPHrine (EPI-PEN) injection 0.3 mg (0.3 mg Intramuscular Given 11/12/19 0345)  albuterol (VENTOLIN HFA) 108 (90 Base) MCG/ACT inhaler 8 puff (8 puffs Inhalation Given 11/12/19 0353)  ipratropium (ATROVENT) nebulizer solution 0.5 mg (0.5 mg Nebulization Given 11/12/19 0448)    ED Course  I have reviewed the triage vital signs and the nursing notes.  Pertinent labs & imaging results that were available during my care of the patient were reviewed by me and considered in my medical decision making (see chart for details).    MDM Reviewed: previous chart, nursing note and vitals Interpretation: labs, ECG and x-ray (No PNA, ekg with st changes ) Total time providing critical care: 75-105 minutes (continuous nebs ). This excludes time spent performing separately reportable procedures and services. Consults: admitting MD  CRITICAL CARE Performed by: Malichi Palardy K Flavio Lindroth-Rasch Total critical care time: 75  minutes Critical care time was exclusive of separately billable procedures and treating other patients. Critical care was necessary to treat or prevent imminent or life-threatening deterioration. Critical care was time spent personally by me on the following activities: development of treatment plan with patient and/or surrogate as well as nursing, discussions with consultants, evaluation of patient's response to treatment, examination of patient, obtaining history from patient or surrogate, ordering and performing treatments and interventions, ordering and review of laboratory studies, ordering and review of radiographic studies, pulse oximetry and re-evaluation of patient's condition. Final Clinical Impression(s) / ED  Diagnoses Final diagnoses:  Severe persistent asthma with status asthmaticus   Admit to medicine step down   Nai Borromeo, MD 11/12/19 0600

## 2019-11-12 NOTE — ED Notes (Signed)
RT at bedside.

## 2019-11-12 NOTE — ED Triage Notes (Signed)
Pt presents via [facility] with SOB since 2030. Pt has hx of asthma- wheezing noted during triage. per ems- pt has had 15 mg albuterol, 1mg  atrovent, 125 solu medrol, and 2g mag. Pt SpO2 99. HR 136

## 2019-11-12 NOTE — Plan of Care (Signed)
18 yo F with asthma exacerbation vs COVID.  CXR suspicious for possible PNA vs atelectasis.  Also suggestive of emphysema (in a 18 yr old?!).  Got epi, got albuterol, now on cont neb.  Put in as a SDU, PUI.

## 2019-11-12 NOTE — ED Notes (Signed)
ED Provider at bedside. 

## 2019-11-12 NOTE — ED Provider Notes (Signed)
Patient still awaiting transport for admission.  Wheezing is improved but persists. Pt states she does feel improved from prior. No chest pain or discomfort. Mild-mod wheezing on exam.   Additional mdi tx.   Await transport. Repeat covid remains pending.      Lajean Saver, MD 11/12/19 [phone]

## 2019-11-12 NOTE — ED Notes (Signed)
Patient transported to CT 

## 2019-11-13 DIAGNOSIS — F419 Anxiety disorder, unspecified: Secondary | ICD-10-CM

## 2019-11-13 DIAGNOSIS — J45901 Unspecified asthma with (acute) exacerbation: Secondary | ICD-10-CM | POA: Diagnosis not present

## 2019-11-13 LAB — CBC
HCT: 39.1 % (ref 36.0–46.0)
Hemoglobin: 12.5 g/dL (ref 12.0–15.0)
MCH: 30 pg (ref 26.0–34.0)
MCHC: 32 g/dL (ref 30.0–36.0)
MCV: 93.8 fL (ref 80.0–100.0)
Platelets: 335 10*3/uL (ref 150–400)
RBC: 4.17 MIL/uL (ref 3.87–5.11)
RDW: 13.6 % (ref 11.5–15.5)
WBC: 11.3 10*3/uL — ABNORMAL HIGH (ref 4.0–10.5)
nRBC: 0 % (ref 0.0–0.2)

## 2019-11-13 LAB — HEMOGLOBIN A1C
Hgb A1c MFr Bld: 5.5 % (ref 4.8–5.6)
Mean Plasma Glucose: 111.15 mg/dL

## 2019-11-13 LAB — BASIC METABOLIC PANEL
Anion gap: 7 (ref 5–15)
BUN: 11 mg/dL (ref 6–20)
CO2: 27 mmol/L (ref 22–32)
Calcium: 9.1 mg/dL (ref 8.9–10.3)
Chloride: 108 mmol/L (ref 98–111)
Creatinine, Ser: 0.68 mg/dL (ref 0.44–1.00)
GFR calc Af Amer: >60 mL/min (ref >60)
GFR calc non Af Amer: >60 mL/min (ref >60)
Glucose, Bld: 118 mg/dL — ABNORMAL HIGH (ref 70–99)
Potassium: 3.9 mmol/L (ref 3.5–5.1)
Sodium: 142 mmol/L (ref 135–145)

## 2019-11-13 LAB — GLUCOSE, CAPILLARY
Glucose-Capillary: 104 mg/dL — ABNORMAL HIGH (ref 70–99)
Glucose-Capillary: 104 mg/dL — ABNORMAL HIGH (ref 70–99)

## 2019-11-13 MED ORDER — ENOXAPARIN SODIUM 100 MG/ML ~~LOC~~ SOLN: 85.0000 mg | SUBCUTANEOUS | Status: DC

## 2019-11-13 MED ORDER — PREDNISONE 20 MG PO TABS: 40.0000 mg | ORAL_TABLET | Freq: Every day | ORAL | 0 refills | Status: AC

## 2019-11-13 NOTE — Progress Notes (Signed)
Pt is being discharged. Discharge instructions including follow ups and medications provided. Pt had no further questions at this time.

## 2019-11-13 NOTE — Progress Notes (Signed)
Brief Pharmacy VTE prophylaxis note:  BMI > 30 CrCl > 143mls/min  Increase enoxaparin to 85mg  (0.5mg /kg) SQ q24h  Dolly Rias RPh 11/13/2019, 1:24 PM

## 2019-11-13 NOTE — Discharge Summary (Signed)
Physician Discharge Summary  Merissa Renwick PPI:[id number] DOB: June 30, 2001 DOA: 11/12/2019  PCP: Riley Kill, MD  Admit date: 11/12/2019 Discharge date: 11/13/2019  Admitted From: Home Disposition:  home  Recommendations for Outpatient Follow-up:  1. Follow up with PCP in 1-2 weeks.  Consider patient pulmonary referral 2. Recommended compliance with inhalers and medications. 3. Recommended to adhere to prednisone course to complete a total of 5 days  Home Health: No Equipment/Devices: None  Discharge Condition: Good CODE STATUS: Full code Diet recommendation: Regular diet  Brief/Interim Summary: 19 year old female with history of asthma, bipolar disorder, type 2 diabetes mellitus admitted for dyspnea with concern for status asthmaticus.  Per patient she was doing fine until she visited her mother.  Says her brother who has anger issues got angry with her younger sister while she was at her mother's place.  This made her very nervous which she thinks caused a panic attack.  She does not think this is her asthma.  Denies any intubations for asthma in the past but per documentation she has had one intubation.  CTA chest negative for PE.  Given her symptoms have resolved and she is saturating well on room air, will discharge her with p.o. prednisone to treat for possible asthma exacerbation.  She will follow up with PCP.  Discharge Diagnoses:  Principal Problem:   Status asthmaticus Active Problems:   Affective psychosis, bipolar ([place])   Diabetes mellitus type 2 in obese [place])    Discharge Instructions:  Discharge Instructions    Call MD for:  difficulty breathing, headache or visual disturbances   Complete by: As directed    Diet - low sodium heart healthy   Complete by: As directed    Discharge instructions   Complete by: As directed    Take inhalers as prescribed and complete prednisone course   Increase activity slowly   Complete by: As directed      Allergies as of  11/13/2019   No Known Allergies     Medication List    TAKE these medications   albuterol 108 (90 Base) MCG/ACT inhaler Commonly known as: VENTOLIN HFA Inhale 2 puffs into the lungs every 4 (four) hours as needed for up to 30 days. For wheeze or shortness of breath What changed: reasons to take this   hydrOXYzine 10 MG tablet Commonly known as: ATARAX/VISTARIL Take 1 tablet (10 mg total) by mouth 3 (three) times daily as needed for anxiety.   ibuprofen 200 MG tablet Commonly known as: ADVIL Take 200 mg by mouth every 6 (six) hours as needed for cramping.   lurasidone 20 MG Tabs tablet Commonly known as: LATUDA Take 2 tablets (40 mg total) by mouth daily with breakfast.   metFORMIN 500 MG tablet Commonly known as: GLUCOPHAGE Take 1 tablet (500 mg total) by mouth daily with breakfast.   mometasone-formoterol 200-5 MCG/ACT Aero Commonly known as: DULERA Inhale 2 puffs into the lungs 2 (two) times daily for 30 days. What changed: when to take this   montelukast 10 MG tablet Commonly known as: SINGULAIR Take 10 mg by mouth at bedtime.   predniSONE 20 MG tablet Commonly known as: DELTASONE Take 2 tablets (40 mg total) by mouth daily with breakfast for 3 days. Start taking on: November 14, 2019       No Known Allergies  Consultations:  None   Procedures/Studies: CT Angio Chest PE W and/or Wo Contrast  Result Date: 11/12/2019 CLINICAL DATA:  Short of breath, asthma. Evaluate for pulmonary embolism.  EXAM: CT ANGIOGRAPHY CHEST WITH CONTRAST TECHNIQUE: Multidetector CT imaging of the chest was performed using the standard protocol during bolus administration of intravenous contrast. Multiplanar CT image reconstructions and MIPs were obtained to evaluate the vascular anatomy. CONTRAST:  160m OMNIPAQUE IOHEXOL 350 MG/ML SOLN COMPARISON:  None. FINDINGS: Cardiovascular: Exam is somewhat limited sensitivity due to the effect of large body habitus on the images. No discrete  filling defects within pulmonary arteries to suggest acute pulmonary embolism. No pericardial effusion.  No acute findings aorta great vessels. Mediastinum/Nodes: No axillary supraclavicular adenopathy. No mediastinal hilar adenopathy. Esophagus normal. Small amount of residual thymus in the anterior mediastinum Lungs/Pleura: Mild ground-glass densities anteromedially in the lingula and RIGHT middle lobe. No pulmonary infarction. No pneumonia. No pleural fluid. Upper Abdomen: Limited view of the liver, kidneys, pancreas are unremarkable. Normal adrenal glands. Musculoskeletal: No acute osseous abnormality. Review of the MIP images confirms the above findings. IMPRESSION: 1. No evidence acute pulmonary embolism in exam limited in sensitivity by body habitus. 2. No pulmonary infarction or pneumonia. Mild atelectasis in the anteromedial RIGHT middle lobe and lingula. Electronically Signed   By: SSuzy BouchardM.D.   On: 11/12/2019 07:31   DG Chest Portable 1 View  Result Date: 11/12/2019 CLINICAL DATA:  Shortness of breath EXAM: PORTABLE CHEST 1 VIEW COMPARISON:  May 17, 2019 FINDINGS: The heart size is normal. There is no pneumothorax. There are streaky bibasilar airspace opacities, greatest at the left lung base. The lungs appear to be slightly hyperexpanded. There is no significant pleural effusion. No acute osseous abnormality. IMPRESSION: 1. Streaky bibasilar airspace opacities, greatest at the left lung base, suspicious for pneumonia or atelectasis. 2. Findings suggest underlying emphysema. Electronically Signed   By: CConstance HolsterM.D.   On: 11/12/2019 03:53    Subjective: No acute events overnight.  Patient reports feeling much better.  Her dyspnea has resolved and states her asthma is at baseline.  Thinks it was a panic attack.  Discharge Exam: Vitals:   11/13/19 1257 11/13/19 1336  BP:    Pulse:  67  Resp: 20 17  Temp:    SpO2: 98% 99%   Vitals:   11/13/19 0809 11/13/19 1245  11/13/19 1257 11/13/19 1336  BP:      Pulse:    67  Resp:  18 20 17   Temp:      TempSrc:      SpO2: 98% 98% 98% 99%  Weight:      Height:        General: Pt is alert, awake, not in acute distress Cardiovascular: RRR, S1/S2 +, no rubs, no gallops Respiratory: CTA bilaterally, mild bilateral expiratory wheezing, no rhonchi Abdominal: Soft, NT, ND, bowel sounds + Extremities: no edema, no cyanosis   The results of significant diagnostics from this hospitalization (including imaging, microbiology, ancillary and laboratory) are listed below for reference.     Microbiology: Recent Results (from the past 240 hour(s))  SARS Coronavirus 2 Ag (30 min TAT) - Nasal Swab (BD Veritor Kit)     Status: None   Collection Time: 11/12/19  4:14 AM   Specimen: Nasal Swab (BD Veritor Kit)  Result Value Ref Range Status   SARS Coronavirus 2 Ag NEGATIVE NEGATIVE Final    Comment: (NOTE) SARS-CoV-2 antigen NOT DETECTED.  Negative results are presumptive.  Negative results do not preclude SARS-CoV-2 infection and should not be used as the sole basis for treatment or other patient management decisions, including infection  control decisions, particularly in  the presence of clinical signs and  symptoms consistent with COVID-19, or in those who have been in contact with the virus.  Negative results must be combined with clinical observations, patient history, and epidemiological information. The expected result is Negative. Fact Sheet for Patients: PodPark.tn Fact Sheet for Healthcare Providers: GiftContent.is This test is not yet approved or cleared by the [place] FDA and  has been authorized for detection and/or diagnosis of SARS-CoV-2 by FDA under an Emergency Use Authorization (EUA).  This EUA will remain in effect (meaning this test can be used) for the duration of  the COVID-19 de claration under Section 564(b)(1) of the Act,  21 U.S.C. section 360bbb-3(b)(1), unless the authorization is terminated or revoked sooner. Performed at [facility], [address] [place], [place] [address]   SARS CORONAVIRUS 2 (TAT 6-24 HRS) Nasopharyngeal Nasal Swab (BD Veritor Kit)     Status: None   Collection Time: 11/12/19  4:14 AM   Specimen: Nasal Swab (BD Veritor Kit); Nasopharyngeal  Result Value Ref Range Status   SARS Coronavirus 2 NEGATIVE NEGATIVE Final    Comment: (NOTE) SARS-CoV-2 target nucleic acids are NOT DETECTED. The SARS-CoV-2 RNA is generally detectable in upper and lower respiratory specimens during the acute phase of infection. Negative results do not preclude SARS-CoV-2 infection, do not rule out co-infections with other pathogens, and should not be used as the sole basis for treatment or other patient management decisions. Negative results must be combined with clinical observations, patient history, and epidemiological information. The expected result is Negative. Fact Sheet for Patients: SugarRoll.be Fact Sheet for Healthcare Providers: https://www.woods-mathews.com/ This test is not yet approved or cleared by the [place] FDA and  has been authorized for detection and/or diagnosis of SARS-CoV-2 by FDA under an Emergency Use Authorization (EUA). This EUA will remain  in effect (meaning this test can be used) for the duration of the COVID-19 declaration under Section 56 4(b)(1) of the Act, 21 U.S.C. section 360bbb-3(b)(1), unless the authorization is terminated or revoked sooner. Performed at [facility], [place] [address], [place], [place] [address]      Labs: BNP (last 3 results) No results for input(s): BNP in the last 8760 hours. Basic Metabolic Panel: Recent Labs  Lab 11/12/19 0342 11/13/19 0609  NA 139 142  K 3.1* 3.9  CL 108 108  CO2 20* 27  GLUCOSE 133* 118*  BUN 9 11  CREATININE 0.85 0.68  CALCIUM 8.4* 9.1    Liver Function Tests: Recent Labs  Lab 11/12/19 0342  AST 17  ALT 16  ALKPHOS 78  BILITOT 0.6  PROT 7.5  ALBUMIN 3.9   No results for input(s): LIPASE, AMYLASE in the last 168 hours. No results for input(s): AMMONIA in the last 168 hours. CBC: Recent Labs  Lab 11/12/19 0342 11/13/19 0609  WBC 8.7 11.3*  NEUTROABS 5.7  --   HGB 12.8 12.5  HCT 38.9 39.1  MCV 90.7 93.8  PLT 340 335   Cardiac Enzymes: No results for input(s): CKTOTAL, CKMB, CKMBINDEX, TROPONINI in the last 168 hours. BNP: Invalid input(s): POCBNP CBG: Recent Labs  Lab 11/12/19 2116 11/13/19 0747 11/13/19 1155  GLUCAP 134* 104* 104*   D-Dimer No results for input(s): DDIMER in the last 72 hours. Hgb A1c Recent Labs    11/13/19 0609  HGBA1C 5.5   Lipid Profile No results for input(s): CHOL, HDL, LDLCALC, TRIG, CHOLHDL, LDLDIRECT in the last 72 hours. Thyroid function studies No  results for input(s): TSH, T4TOTAL, T3FREE, THYROIDAB in the last 72 hours.  Invalid input(s): FREET3 Anemia work up No results for input(s): VITAMINB12, FOLATE, FERRITIN, TIBC, IRON, RETICCTPCT in the last 72 hours. Urinalysis    Component Value Date/Time   COLORURINE YELLOW 01/24/2017 1140   APPEARANCEUR HAZY (A) 01/24/2017 1140   LABSPEC 1.025 01/24/2017 1140   PHURINE 5.0 01/24/2017 1140   GLUCOSEU NEGATIVE 01/24/2017 1140   HGBUR NEGATIVE 01/24/2017 1140   BILIRUBINUR NEGATIVE 01/24/2017 1140   KETONESUR NEGATIVE 01/24/2017 1140   PROTEINUR NEGATIVE 01/24/2017 1140   UROBILINOGEN 0.2 01/08/2013 2339   NITRITE NEGATIVE 01/24/2017 1140   LEUKOCYTESUR NEGATIVE 01/24/2017 1140   Sepsis Labs Invalid input(s): PROCALCITONIN,  WBC,  LACTICIDVEN Microbiology Recent Results (from the past 240 hour(s))  SARS Coronavirus 2 Ag (30 min TAT) - Nasal Swab (BD Veritor Kit)     Status: None   Collection Time: 11/12/19  4:14 AM   Specimen: Nasal Swab (BD Veritor Kit)  Result Value Ref Range Status   SARS  Coronavirus 2 Ag NEGATIVE NEGATIVE Final    Comment: (NOTE) SARS-CoV-2 antigen NOT DETECTED.  Negative results are presumptive.  Negative results do not preclude SARS-CoV-2 infection and should not be used as the sole basis for treatment or other patient management decisions, including infection  control decisions, particularly in the presence of clinical signs and  symptoms consistent with COVID-19, or in those who have been in contact with the virus.  Negative results must be combined with clinical observations, patient history, and epidemiological information. The expected result is Negative. Fact Sheet for Patients: PodPark.tn Fact Sheet for Healthcare Providers: GiftContent.is This test is not yet approved or cleared by the [place] FDA and  has been authorized for detection and/or diagnosis of SARS-CoV-2 by FDA under an Emergency Use Authorization (EUA).  This EUA will remain in effect (meaning this test can be used) for the duration of  the COVID-19 de claration under Section 564(b)(1) of the Act, 21 U.S.C. section 360bbb-3(b)(1), unless the authorization is terminated or revoked sooner. Performed at [facility], [address] [place], [place] [address]   SARS CORONAVIRUS 2 (TAT 6-24 HRS) Nasopharyngeal Nasal Swab (BD Veritor Kit)     Status: None   Collection Time: 11/12/19  4:14 AM   Specimen: Nasal Swab (BD Veritor Kit); Nasopharyngeal  Result Value Ref Range Status   SARS Coronavirus 2 NEGATIVE NEGATIVE Final    Comment: (NOTE) SARS-CoV-2 target nucleic acids are NOT DETECTED. The SARS-CoV-2 RNA is generally detectable in upper and lower respiratory specimens during the acute phase of infection. Negative results do not preclude SARS-CoV-2 infection, do not rule out co-infections with other pathogens, and should not be used as the sole basis for treatment or other patient management  decisions. Negative results must be combined with clinical observations, patient history, and epidemiological information. The expected result is Negative. Fact Sheet for Patients: SugarRoll.be Fact Sheet for Healthcare Providers: https://www.woods-mathews.com/ This test is not yet approved or cleared by the [place] FDA and  has been authorized for detection and/or diagnosis of SARS-CoV-2 by FDA under an Emergency Use Authorization (EUA). This EUA will remain  in effect (meaning this test can be used) for the duration of the COVID-19 declaration under Section 56 4(b)(1) of the Act, 21 U.S.C. section 360bbb-3(b)(1), unless the authorization is terminated or revoked sooner. Performed at [facility], [place] [address] [place], [place] [address]      Time  coordinating discharge: Over 30 minutes  SIGNED:  Lucky Cowboy, MD  [facility] 11/13/2019, 8:25 PM  If 7PM-7AM, please contact night-coverage

## 2020-05-03 ENCOUNTER — Emergency Department (HOSPITAL_COMMUNITY): Payer: Medicaid Other

## 2020-05-03 ENCOUNTER — Emergency Department (HOSPITAL_COMMUNITY)
Admission: EM | Admit: 2020-05-03 | Discharge: 2020-05-03 | Disposition: A | Discharge status: Home / Self Care | Payer: Medicaid Other | Attending: Emergency Medicine | Admitting: Emergency Medicine

## 2020-05-03 ENCOUNTER — Other Ambulatory Visit: Payer: Self-pay

## 2020-05-03 ENCOUNTER — Encounter (HOSPITAL_COMMUNITY): Payer: Self-pay | Admitting: Emergency Medicine

## 2020-05-03 DIAGNOSIS — Z20822 Contact with and (suspected) exposure to covid-19: Secondary | ICD-10-CM | POA: Diagnosis not present

## 2020-05-03 DIAGNOSIS — Z7984 Long term (current) use of oral hypoglycemic drugs: Secondary | ICD-10-CM | POA: Diagnosis not present

## 2020-05-03 DIAGNOSIS — E119 Type 2 diabetes mellitus without complications: Secondary | ICD-10-CM | POA: Diagnosis not present

## 2020-05-03 DIAGNOSIS — J4551 Severe persistent asthma with (acute) exacerbation: Secondary | ICD-10-CM | POA: Diagnosis not present

## 2020-05-03 DIAGNOSIS — R062 Wheezing: Secondary | ICD-10-CM | POA: Diagnosis present

## 2020-05-03 DIAGNOSIS — F909 Attention-deficit hyperactivity disorder, unspecified type: Secondary | ICD-10-CM | POA: Insufficient documentation

## 2020-05-03 DIAGNOSIS — Z79899 Other long term (current) drug therapy: Secondary | ICD-10-CM | POA: Insufficient documentation

## 2020-05-03 LAB — CBC WITH DIFFERENTIAL/PLATELET
Abs Immature Granulocytes: 0.02 10*3/uL (ref 0.00–0.07)
Basophils Absolute: 0.1 10*3/uL (ref 0.0–0.1)
Basophils Relative: 1 %
Eosinophils Absolute: 0.3 10*3/uL (ref 0.0–0.5)
Eosinophils Relative: 5 %
HCT: 36.9 % (ref 36.0–46.0)
Hemoglobin: 12.3 g/dL (ref 12.0–15.0)
Immature Granulocytes: 0 %
Lymphocytes Relative: 41 %
Lymphs Abs: 2.9 10*3/uL (ref 0.7–4.0)
MCH: 30.1 pg (ref 26.0–34.0)
MCHC: 33.3 g/dL (ref 30.0–36.0)
MCV: 90.4 fL (ref 80.0–100.0)
Monocytes Absolute: 0.5 10*3/uL (ref 0.1–1.0)
Monocytes Relative: 7 %
Neutro Abs: 3.3 10*3/uL (ref 1.7–7.7)
Neutrophils Relative %: 46 %
Platelets: 277 10*3/uL (ref 150–400)
RBC: 4.08 MIL/uL (ref 3.87–5.11)
RDW: 13.2 % (ref 11.5–15.5)
WBC: 7.1 10*3/uL (ref 4.0–10.5)
nRBC: 0 % (ref 0.0–0.2)

## 2020-05-03 LAB — BASIC METABOLIC PANEL
Anion gap: 7 (ref 5–15)
BUN: 15 mg/dL (ref 6–20)
CO2: 22 mmol/L (ref 22–32)
Calcium: 8.4 mg/dL — ABNORMAL LOW (ref 8.9–10.3)
Chloride: 105 mmol/L (ref 98–111)
Creatinine, Ser: 0.63 mg/dL (ref 0.44–1.00)
GFR calc Af Amer: >60 mL/min (ref >60)
GFR calc non Af Amer: >60 mL/min (ref >60)
Glucose, Bld: 100 mg/dL — ABNORMAL HIGH (ref 70–99)
Potassium: 3.8 mmol/L (ref 3.5–5.1)
Sodium: 134 mmol/L — ABNORMAL LOW (ref 135–145)

## 2020-05-03 LAB — I-STAT BETA HCG BLOOD, ED (MC, WL, AP ONLY): I-stat hCG, quantitative: <5 m[IU]/mL (ref <5)

## 2020-05-03 LAB — SARS CORONAVIRUS 2 BY RT PCR (HOSPITAL ORDER, PERFORMED IN ~~LOC~~ HOSPITAL LAB): SARS Coronavirus 2: NEGATIVE

## 2020-05-03 LAB — POC SARS CORONAVIRUS 2 AG -  ED: SARS Coronavirus 2 Ag: NEGATIVE

## 2020-05-03 MED ORDER — ALBUTEROL SULFATE HFA 108 (90 BASE) MCG/ACT IN AERS: 2.0000 | INHALATION_SPRAY | RESPIRATORY_TRACT | 2 refills | Status: DC | PRN

## 2020-05-03 MED ORDER — AEROCHAMBER PLUS FLO-VU MEDIUM MISC
1.0000 | Freq: Once | Status: AC
  Administered 2020-05-03: 1
  Filled 2020-05-03: qty 1

## 2020-05-03 MED ORDER — DIPHENHYDRAMINE HCL 50 MG/ML IJ SOLN
25.0000 mg | Freq: Once | INTRAMUSCULAR | Status: AC
  Administered 2020-05-03: 25 mg via INTRAVENOUS
  Filled 2020-05-03: qty 1

## 2020-05-03 MED ORDER — TRIAMCINOLONE ACETONIDE 55 MCG/ACT NA AERO: 2.0000 | INHALATION_SPRAY | Freq: Every day | NASAL | 2 refills | Status: DC

## 2020-05-03 MED ORDER — FEXOFENADINE HCL 180 MG PO TABS: 180.0000 mg | ORAL_TABLET | Freq: Every day | ORAL | 1 refills | Status: DC

## 2020-05-03 MED ORDER — SODIUM CHLORIDE 0.9 % IV SOLN: INTRAVENOUS | Status: DC

## 2020-05-03 MED ORDER — ALBUTEROL SULFATE HFA 108 (90 BASE) MCG/ACT IN AERS
8.0000 | INHALATION_SPRAY | Freq: Once | RESPIRATORY_TRACT | Status: AC
  Administered 2020-05-03: 8 via RESPIRATORY_TRACT
  Filled 2020-05-03: qty 6.7

## 2020-05-03 MED ORDER — ALBUTEROL SULFATE (2.5 MG/3ML) 0.083% IN NEBU
5.0000 mg | INHALATION_SOLUTION | Freq: Once | RESPIRATORY_TRACT | Status: AC
  Administered 2020-05-03: 5 mg via RESPIRATORY_TRACT
  Filled 2020-05-03: qty 6

## 2020-05-03 MED ORDER — MAGNESIUM SULFATE 2 GM/50ML IV SOLN
2.0000 g | Freq: Once | INTRAVENOUS | Status: AC
  Administered 2020-05-03: 2 g via INTRAVENOUS
  Filled 2020-05-03: qty 50

## 2020-05-03 MED ORDER — ALBUTEROL SULFATE (2.5 MG/3ML) 0.083% IN NEBU: 2.5000 mg | INHALATION_SOLUTION | Freq: Four times a day (QID) | RESPIRATORY_TRACT | 2 refills | Status: DC | PRN

## 2020-05-03 MED ORDER — PREDNISONE 20 MG PO TABS: 40.0000 mg | ORAL_TABLET | Freq: Every day | ORAL | 0 refills | Status: AC

## 2020-05-03 NOTE — ED Provider Notes (Signed)
[place] DEPT Provider Note   CSN: [id number] Arrival date & time: 05/03/20  1030     History Chief Complaint  Patient presents with  . Asthma    Whitney Beard is a 19 y.o. female with a past medical history of ADHD, asthma, bipolar 1, DM, obesity, sleep apnea, who presents today for evaluation of an asthma attack. She reports she was exposed to cats last night and since then has been wheezing. She states that she gave herself 2 albuterol treatments today. EMS gave her a DuoNeb and 125 mg Solu-Medrol. She was noted to be in the low 90s on room air with EMS before they gave her the DuoNeb. She does not take any maintenance allergy medications.  She has not had any benadryl. She has never been intubated or put on BiPAP for her asthma. She states last time her breathing was this bad was about 4 months ago when she was admitted for status asthmaticus. She denies any fevers. She is not vaccinated against Covid and has not been previously diagnosed with Covid. She states that she gets tested every week, her last test was last night however she does not yet have those results back. She denies any fevers. She states that this feels like her usual asthma attack.  HPI     Past Medical History:  Diagnosis Date  . ADHD (attention deficit hyperactivity disorder)   . Allergic rhinitis   . Asthma   . Bipolar 1 disorder ([place])   . Diabetes mellitus without complication ([place])    states today 03/09/16 "was told in the past that she was borderline diabetic"  . Obesity   . Sleep apnea   . Suicide attempt by drug ingestion [facility])     Patient Active Problem List   Diagnosis Date Noted  . Anxiety   . Asthma 05/17/2019  . Diabetes mellitus type 2 in obese ([place]) 03/09/2019  . Suicide attempt ([place])   . Hypokalemia 12/26/2018  . OSA (obstructive sleep apnea) 12/26/2018  . Morbid obesity with BMI of 50.0-59.9, adult ([place]) 12/26/2018  . Asthma exacerbation 10/16/2017  . Status  asthmaticus 01/19/2017  . 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 MDD (major depressive disorder), recurrent severe, without psychosis ([place]) 03/12/2016  . Overdose, intentional self-harm, initial encounter ([place])   . Affective psychosis, bipolar ([place])     Past Surgical History:  Procedure Laterality Date  . ADENOIDECTOMY    . TONSILLECTOMY       OB History   No obstetric history on file.     Family History  Problem Relation Age of Onset  . Bipolar disorder Father   . Schizophrenia Father   . Bipolar disorder Maternal Grandmother   . SIDS Maternal Aunt   . Heart disease Paternal Grandmother     Social History   Tobacco Use  . Smoking status: Never Smoker  . Smokeless tobacco: Never Used  Substance Use Topics  . Alcohol use: No  . Drug use: No    Home Medications Prior to Admission medications   Medication Sig Start Date End Date Taking? Authorizing Provider  albuterol (PROAIR HFA) 108 (90 Base) MCG/ACT inhaler Inhale 2 puffs into the lungs every 6 (six) hours as needed for wheezing or shortness of breath.   Yes [provider]  Albuterol Sulfate 2.5 MG/0.5ML NEBU Inhale 2 mLs into the lungs as needed (wheezing, SOB).   Yes [provider]  ibuprofen (ADVIL) 200 MG tablet Take 200 mg by mouth  every 6 (six) hours as needed for mild pain or cramping.    Yes [provider]  mometasone-formoterol (DULERA) 200-5 MCG/ACT AERO Inhale 2 puffs into the lungs 2 (two) times daily for 30 days. Patient taking differently: Inhale 2 puffs into the lungs daily.  05/18/19 05/03/20 Yes Zigmund Daniel., MD  OLANZapine zydis (ZYPREXA) 5 MG disintegrating tablet Take 1 tablet by mouth daily as needed for anxiety. 04/04/20 05/04/20 Yes [provider]  venlafaxine XR (EFFEXOR-XR) 75 MG 24 hr capsule Take 1 capsule by mouth daily. 04/05/20 05/05/20 Yes [provider]  albuterol (PROVENTIL) (2.5 MG/3ML) 0.083% nebulizer solution Take 3 mLs (2.5 mg  total) by nebulization every 6 (six) hours as needed for wheezing or shortness of breath. 05/03/20   Cristina Gong, PA-C  albuterol (VENTOLIN HFA) 108 (90 Base) MCG/ACT inhaler Inhale 2 puffs into the lungs every 4 (four) hours as needed for wheezing or shortness of breath. 05/03/20   Cristina Gong, PA-C  fexofenadine (ALLEGRA) 180 MG tablet Take 1 tablet (180 mg total) by mouth at bedtime. 05/03/20   Cristina Gong, PA-C  hydrOXYzine (ATARAX/VISTARIL) 10 MG tablet Take 1 tablet (10 mg total) by mouth 3 (three) times daily as needed for anxiety. Patient not taking: Reported on 05/03/2020 01/19/18   Denzil Magnuson, NP  lurasidone (LATUDA) 20 MG TABS tablet Take 2 tablets (40 mg total) by mouth daily with breakfast. Patient not taking: Reported on 05/03/2020 03/17/19   Leata Mouse, MD  metFORMIN (GLUCOPHAGE) 500 MG tablet Take 1 tablet (500 mg total) by mouth daily with breakfast. Patient not taking: Reported on 05/03/2020 01/19/18   Denzil Magnuson, NP  predniSONE (DELTASONE) 20 MG tablet Take 2 tablets (40 mg total) by mouth daily for 4 days. 05/03/20 05/07/20  Cristina Gong, PA-C  triamcinolone (NASACORT) 55 MCG/ACT AERO nasal inhaler Place 2 sprays into the nose daily. 05/03/20   Cristina Gong, PA-C    Allergies    Other  Review of Systems   Review of Systems  Constitutional: Negative for chills and fever.  Respiratory: Positive for cough and shortness of breath. Negative for chest tightness.   Cardiovascular: Negative for chest pain and leg swelling.  Gastrointestinal: Negative for abdominal pain, diarrhea and nausea.  Genitourinary: Negative for dysuria and urgency.  Musculoskeletal: Negative for back pain and neck pain.  Skin: Negative for color change and rash.  Neurological: Negative for speech difficulty, weakness and headaches.  All other systems reviewed and are negative.   Physical Exam Updated Vital Signs BP (!) 141/80   Pulse (!) 109   Temp  98.6 F (37 C) (Oral)   Resp (!) 26   Ht 5\' 5"  (1.651 m)   Wt (!) 173 kg   LMP 04/02/2020   SpO2 95%   BMI 63.47 kg/m   Physical Exam Vitals and nursing note reviewed.  Constitutional:      General: She is not in acute distress.    Appearance: She is well-developed. She is not diaphoretic.     Comments: Audible wheezes without the use of stethoscope  HENT:     Head: Normocephalic and atraumatic.  Eyes:     General: No scleral icterus.       Right eye: No discharge.        Left eye: No discharge.     Conjunctiva/sclera: Conjunctivae normal.  Cardiovascular:     Rate and Rhythm: Regular rhythm. Tachycardia present.     Pulses: Normal pulses.  Heart sounds: Normal heart sounds.  Pulmonary:     Effort: Accessory muscle usage, prolonged expiration and respiratory distress (Mild) present.     Breath sounds: No stridor. Wheezing (Diffuse bilaterally, audible with out stethoscope) present.     Comments: 4-5 word dyspnea Abdominal:     General: There is no distension.     Tenderness: There is no abdominal tenderness.  Musculoskeletal:        General: No deformity.     Cervical back: Normal range of motion and neck supple.  Skin:    General: Skin is warm and dry.  Neurological:     Mental Status: She is alert.     Motor: No abnormal muscle tone.     Comments: Patient is awake and alert, speech is not slurred. She is able to answer questions appropriately.  Psychiatric:        Mood and Affect: Mood normal.        Behavior: Behavior normal.     ED Results / Procedures / Treatments   Labs (all labs ordered are listed, but only abnormal results are displayed) Labs Reviewed  BASIC METABOLIC PANEL - Abnormal; Notable for the following components:      Result Value   Sodium 134 (*)    Glucose, Bld 100 (*)    Calcium 8.4 (*)    All other components within normal limits  SARS CORONAVIRUS 2 BY RT PCR (HOSPITAL ORDER, PERFORMED IN [facility])  CBC WITH  DIFFERENTIAL/PLATELET  I-STAT BETA HCG BLOOD, ED ([facility], [facility], [facility] ONLY)  POC SARS CORONAVIRUS 2 AG -  ED    EKG None  Radiology DG Chest Port 1 View  Result Date: 05/03/2020 CLINICAL DATA:  Shortness of breath.  History of asthma. EXAM: PORTABLE CHEST 1 VIEW COMPARISON:  CTA chest and chest x-ray dated November 12, 2019. FINDINGS: The heart size and mediastinal contours are within normal limits. Normal pulmonary vascularity. Mild right infrahilar atelectasis. No focal consolidation, pleural effusion, or pneumothorax. No acute osseous abnormality. IMPRESSION: 1. Mild right infrahilar atelectasis. Electronically Signed   By: Obie Dredge M.D.   On: 05/03/2020 11:37    Procedures Procedures (including critical care time)  Medications Ordered in ED Medications  0.9 %  sodium chloride infusion ( Intravenous Stopped 05/03/20 1454)  magnesium sulfate IVPB 2 g 50 mL (0 g Intravenous Stopped 05/03/20 1207)  albuterol (VENTOLIN HFA) 108 (90 Base) MCG/ACT inhaler 8 puff (8 puffs Inhalation Given 05/03/20 1106)  AeroChamber Plus Flo-Vu Medium MISC 1 each (1 each Other Given 05/03/20 1115)  diphenhydrAMINE (BENADRYL) injection 25 mg (25 mg Intravenous Given 05/03/20 1110)  albuterol (PROVENTIL) (2.5 MG/3ML) 0.083% nebulizer solution 5 mg (5 mg Nebulization Given 05/03/20 1257)  (Patient was given DuoNeb, 125 mg Solu-Medrol by EMS prior to arrival.)  ED Course  I have reviewed the triage vital signs and the nursing notes.  Pertinent labs & imaging results that were available during my care of the patient were reviewed by me and considered in my medical decision making (see chart for details).  Clinical Course as of May 03 1817  Fri May 03, 2020  1253 Patient reevaluated, she states she feels almost back to normal.  She was sleepy after she got the Benadryl and Rn placed on 2 L nasal cannula.  I turned this off, her RN is aware and will monitor.  She has had a negative Covid test, will give 5 mg albuterol neb  and reassess.   [EH]  Clinical Course User Index [EH] Norman Clay   MDM Rules/Calculators/A&P                     Patient is a 19 year old woman with a significant past medical history of severe persistent asthma who presents today for evaluation of asthma exacerbation after she was around cats last night. On exam she has 4-5 word dyspnea, has audible wheezes without requiring stethoscope. She is in mild respiratory distress however awake and alert. She had 2 albuterol nebulizer treatments at home, a DuoNeb with EMS and was given 125 mg of Solu-Medrol by EMS therefore Atrovent and steroids were not readministered in the emergency room.  She has not been vaccinated against Covid and does not have a history of Covid. Antigen test and PCR tests are ordered given suspicion that she may need nebulizers. Given that her asthma exacerbation started after she was around cats last night we will add in low-dose antihistamine with IV Benadryl. She is given IV magnesium, and 8 puffs of albuterol.     CXR obtained showing atelectasis without consolidation.  Covid PCR and antigen tests are both negative.  After first round of albuterol and negative Covid test patient was treated with a 5 mg albuterol nebulizer, after which she felt like her breathing was back to her normal.  She continued to be slightly wheezy which she states is baseline.  She remained tachycardic, which she states is usual for her when she gets albuterol.  She was mildly tachypneic however again felt like her breathing was normal for her.  She feels that this is consistent with her usual asthma exacerbation.  Do not suspect PE as I would not expect PE to cause diffuse bilateral wheezing after exposure to a known allergic trigger.  I did offer patient admission, however she declined as she feels like her breathing is improved and back to her normal. She was able to ambulate on room air with out desaturating.  She is given  prescriptions for albuterol inhaler, albuterol nebulizer, and prednisone.  I also suggested that she start taking a second-generation antihistamine such as Allegra and using Nasacort to help treat seasonal allergy symptoms.   Return precautions were discussed with patient who states their understanding.  At the time of discharge patient denied any unaddressed complaints or concerns.  Patient is agreeable for discharge home.  Note: Portions of this report may have been transcribed using voice recognition software. Every effort was made to ensure accuracy; however, inadvertent computerized transcription errors may be present   Final Clinical Impression(s) / ED Diagnoses Final diagnoses:  Severe persistent asthma with exacerbation    Rx / DC Orders ED Discharge Orders         Ordered    albuterol (VENTOLIN HFA) 108 (90 Base) MCG/ACT inhaler  Every 4 hours PRN     05/03/20 1407    albuterol (PROVENTIL) (2.5 MG/3ML) 0.083% nebulizer solution  Every 6 hours PRN     05/03/20 1407    predniSONE (DELTASONE) 20 MG tablet  Daily     05/03/20 1407    fexofenadine (ALLEGRA) 180 MG tablet  Daily at bedtime     05/03/20 1430    triamcinolone (NASACORT) 55 MCG/ACT AERO nasal inhaler  Daily     05/03/20 1430           Norman Clay 05/03/20 Katherine Basset, MD 05/04/20 [phone]

## 2020-05-03 NOTE — ED Triage Notes (Signed)
Arrives via EMS from home, C/C asthma attacks since last night, she was exposed to cats last ngiht. When Ems arrived she was giving herself a 5mg  albuterol treatment, had another one at 0600 this morning. Wheezing in all fields with EMS. Patient is alert and oriented.   EMS gave solumedrol 125mg , and a DuoNeb. 20G RAC Low 90s on RA per EMS before DuoNeb use CBG 103 RR 30s HR 120s  BP 115/80

## 2020-05-03 NOTE — Discharge Instructions (Addendum)
Today I have given you multiple prescriptions.  Nasacort and Allegra are both available over-the-counter.  You may choose to use the prescription and buy them over-the-counter, which ever way is cheaper for you.  These are both medicines to help with seasonal allergies. Do not start taking the prednisone until tomorrow.    If your symptoms worsen or you have additional concerns please seek additional medical care and evaluation.  Today you received medications that may make you sleepy or impair your ability to make decisions.  For the next 24 hours please do not drive, operate heavy machinery, care for a small child with out another adult present, or perform any activities that may cause harm to you or someone else if you were to fall asleep or be impaired.

## 2020-05-03 NOTE — ED Notes (Signed)
Pulse ox while ambulating 98% 

## 2020-06-12 ENCOUNTER — Emergency Department (HOSPITAL_COMMUNITY)
Admission: EM | Admit: 2020-06-12 | Discharge: 2020-06-12 | Discharge status: Absent without leave | Payer: Medicaid Other

## 2020-06-27 ENCOUNTER — Encounter (HOSPITAL_COMMUNITY): Payer: Self-pay | Admitting: Student

## 2020-06-27 ENCOUNTER — Other Ambulatory Visit: Payer: Self-pay

## 2020-06-27 ENCOUNTER — Emergency Department (HOSPITAL_COMMUNITY)
Admission: EM | Admit: 2020-06-27 | Discharge: 2020-06-28 | Disposition: A | Discharge status: Other Acute Inpatient Hospital | Payer: Medicaid Other | Attending: Emergency Medicine | Admitting: Emergency Medicine

## 2020-06-27 DIAGNOSIS — F909 Attention-deficit hyperactivity disorder, unspecified type: Secondary | ICD-10-CM | POA: Insufficient documentation

## 2020-06-27 DIAGNOSIS — Z79899 Other long term (current) drug therapy: Secondary | ICD-10-CM | POA: Insufficient documentation

## 2020-06-27 DIAGNOSIS — Z20822 Contact with and (suspected) exposure to covid-19: Secondary | ICD-10-CM | POA: Insufficient documentation

## 2020-06-27 DIAGNOSIS — Z7984 Long term (current) use of oral hypoglycemic drugs: Secondary | ICD-10-CM | POA: Insufficient documentation

## 2020-06-27 DIAGNOSIS — E119 Type 2 diabetes mellitus without complications: Secondary | ICD-10-CM | POA: Insufficient documentation

## 2020-06-27 DIAGNOSIS — J45902 Unspecified asthma with status asthmaticus: Secondary | ICD-10-CM | POA: Insufficient documentation

## 2020-06-27 DIAGNOSIS — F332 Major depressive disorder, recurrent severe without psychotic features: Secondary | ICD-10-CM | POA: Insufficient documentation

## 2020-06-27 DIAGNOSIS — R45851 Suicidal ideations: Secondary | ICD-10-CM | POA: Insufficient documentation

## 2020-06-27 LAB — CBC
HCT: 39.4 % (ref 36.0–46.0)
Hemoglobin: 13 g/dL (ref 12.0–15.0)
MCH: 30 pg (ref 26.0–34.0)
MCHC: 33 g/dL (ref 30.0–36.0)
MCV: 91 fL (ref 80.0–100.0)
Platelets: 364 10*3/uL (ref 150–400)
RBC: 4.33 MIL/uL (ref 3.87–5.11)
RDW: 13.2 % (ref 11.5–15.5)
WBC: 9.1 10*3/uL (ref 4.0–10.5)
nRBC: 0 % (ref 0.0–0.2)

## 2020-06-27 LAB — COMPREHENSIVE METABOLIC PANEL
ALT: 14 U/L (ref 0–44)
AST: 13 U/L — ABNORMAL LOW (ref 15–41)
Albumin: 3.9 g/dL (ref 3.5–5.0)
Alkaline Phosphatase: 89 U/L (ref 38–126)
Anion gap: 13 (ref 5–15)
BUN: 9 mg/dL (ref 6–20)
CO2: 22 mmol/L (ref 22–32)
Calcium: 9 mg/dL (ref 8.9–10.3)
Chloride: 106 mmol/L (ref 98–111)
Creatinine, Ser: 0.75 mg/dL (ref 0.44–1.00)
GFR calc Af Amer: >60 mL/min (ref >60)
GFR calc non Af Amer: >60 mL/min (ref >60)
Glucose, Bld: 102 mg/dL — ABNORMAL HIGH (ref 70–99)
Potassium: 3.5 mmol/L (ref 3.5–5.1)
Sodium: 141 mmol/L (ref 135–145)
Total Bilirubin: 0.5 mg/dL (ref 0.3–1.2)
Total Protein: 7.7 g/dL (ref 6.5–8.1)

## 2020-06-27 LAB — ETHANOL: Alcohol, Ethyl (B): <10 mg/dL (ref <10)

## 2020-06-27 LAB — HCG, QUANTITATIVE, PREGNANCY: hCG, Beta Chain, Quant, S: <1 m[IU]/mL (ref <5)

## 2020-06-27 LAB — ACETAMINOPHEN LEVEL: Acetaminophen (Tylenol), Serum: <10 ug/mL — ABNORMAL LOW (ref 10–30)

## 2020-06-27 LAB — SALICYLATE LEVEL: Salicylate Lvl: <7 mg/dL — ABNORMAL LOW (ref 7.0–30.0)

## 2020-06-27 MED ORDER — ZOLPIDEM TARTRATE 5 MG PO TABS: 5.0000 mg | ORAL_TABLET | Freq: Every evening | ORAL | Status: DC | PRN

## 2020-06-27 MED ORDER — ALUM & MAG HYDROXIDE-SIMETH 200-200-20 MG/5ML PO SUSP: 30.0000 mL | Freq: Four times a day (QID) | ORAL | Status: DC | PRN

## 2020-06-27 MED ORDER — MOMETASONE FURO-FORMOTEROL FUM 200-5 MCG/ACT IN AERO: 2.0000 | INHALATION_SPRAY | Freq: Two times a day (BID) | RESPIRATORY_TRACT | Status: DC

## 2020-06-27 MED ORDER — ACETAMINOPHEN 325 MG PO TABS: 650.0000 mg | ORAL_TABLET | ORAL | Status: DC | PRN

## 2020-06-27 MED ORDER — LORAZEPAM 1 MG PO TABS
1.0000 mg | ORAL_TABLET | Freq: Once | ORAL | Status: AC
  Administered 2020-06-27: 1 mg via ORAL
  Filled 2020-06-27: qty 1

## 2020-06-27 MED ORDER — ALBUTEROL SULFATE HFA 108 (90 BASE) MCG/ACT IN AERS
2.0000 | INHALATION_SPRAY | RESPIRATORY_TRACT | Status: DC | PRN
  Administered 2020-06-28: 2 via RESPIRATORY_TRACT
  Filled 2020-06-27 (×2): qty 6.7

## 2020-06-27 MED ORDER — OLANZAPINE 5 MG PO TBDP: 5.0000 mg | ORAL_TABLET | Freq: Every day | ORAL | Status: DC | PRN

## 2020-06-27 NOTE — ED Provider Notes (Signed)
[facility]-EMERGENCY DEPT Provider Note   CSN: [id number] Arrival date & time: 06/27/20  2153     History Chief Complaint  Patient presents with   suicidal, ivc    Whitney Beard is a 19 y.o. female with a history of bipolar 1 disorder, ADHD, anxiety, sleep apnea, & asthma who presents to the ED under IVC with suicidal ideations. Patient states she is having a "mental breakdown" which has been occurring for approximately 3 weeks, progressively worsening. Having thoughts of suicide with plan of driving her car into something or cutting her wrists- no attempts made. No specific alleviating/aggravating factors. She currently feels very upset & anxious. Denies HI or hallucinations. Relays that she drinks occasionally when she is upset, not daily, no hx of EtOH withdrawal, also smokes marijuana daily.   IVC paperwork reviewed & discussion with police additional history-- Patient's mother called police as patient was becoming violent with family members, police states that the patient was telling them to shoot her.   HPI     Past Medical History:  Diagnosis Date   ADHD (attention deficit hyperactivity disorder)    Allergic rhinitis    Asthma    Bipolar 1 disorder ([facility])    Diabetes mellitus without complication ([facility])    states today 03/09/16 "was told in the past that she was borderline diabetic"   Obesity    Sleep apnea    Suicide attempt by drug ingestion [facility])     Patient Active Problem List   Diagnosis Date Noted   Anxiety    Asthma 05/17/2019   Diabetes mellitus type 2 in obese ([facility]) 03/09/2019   Suicide attempt ([facility])    Hypokalemia 12/26/2018   OSA (obstructive sleep apnea) 12/26/2018   Morbid obesity with BMI of 50.0-59.9, adult ([facility]) 12/26/2018   Asthma exacerbation 10/16/2017   Status asthmaticus 01/19/2017   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     Family History  Problem Relation Age of Onset   Bipolar disorder Father    Schizophrenia Father    Bipolar disorder Maternal Grandmother    SIDS Maternal Aunt    Heart disease Paternal Grandmother     Social History   Tobacco Use   Smoking status: Never Smoker   Smokeless tobacco: Never Used  Substance Use Topics   Alcohol use: No   Drug use: No    Home Medications Prior to Admission medications   Medication Sig Start Date End Date Taking? Authorizing Provider  albuterol (PROAIR HFA) 108 (90 Base) MCG/ACT inhaler Inhale 2 puffs into the lungs every 6 (six) hours as needed for wheezing or shortness of breath.    [provider]  albuterol (PROVENTIL) (2.5 MG/3ML) 0.083% nebulizer solution Take 3 mLs (2.5 mg total) by nebulization every 6 (six) hours as needed for wheezing or shortness of breath. 05/03/20   Cristina Gong, PA-C  albuterol (VENTOLIN HFA) 108 (90 Base) MCG/ACT inhaler Inhale 2 puffs into the lungs every 4 (four) hours as needed for wheezing or shortness of breath. 05/03/20   Cristina Gong, PA-C  Albuterol Sulfate 2.5 MG/0.5ML NEBU Inhale 2 mLs into the lungs as  needed (wheezing, SOB).    [provider]  fexofenadine (ALLEGRA) 180 MG tablet Take 1 tablet (180 mg total) by mouth at bedtime. 05/03/20   Cristina Gong, PA-C  hydrOXYzine (ATARAX/VISTARIL) 10 MG tablet Take 1 tablet (10 mg total) by mouth 3 (three) times daily as needed for anxiety. Patient not taking: Reported on 05/03/2020 01/19/18   Denzil Magnuson, NP  ibuprofen (ADVIL) 200 MG tablet Take 200 mg by mouth every 6 (six) hours as needed for mild pain or cramping.     [provider]  lurasidone  (LATUDA) 20 MG TABS tablet Take 2 tablets (40 mg total) by mouth daily with breakfast. Patient not taking: Reported on 05/03/2020 03/17/19   Leata Mouse, MD  metFORMIN (GLUCOPHAGE) 500 MG tablet Take 1 tablet (500 mg total) by mouth daily with breakfast. Patient not taking: Reported on 05/03/2020 01/19/18   Denzil Magnuson, NP  mometasone-formoterol (DULERA) 200-5 MCG/ACT AERO Inhale 2 puffs into the lungs 2 (two) times daily for 30 days. Patient taking differently: Inhale 2 puffs into the lungs daily.  05/18/19 05/03/20  Zigmund Daniel., MD  OLANZapine zydis (ZYPREXA) 5 MG disintegrating tablet Take 1 tablet by mouth daily as needed for anxiety. 04/04/20 05/04/20  [provider]  triamcinolone (NASACORT) 55 MCG/ACT AERO nasal inhaler Place 2 sprays into the nose daily. 05/03/20   Cristina Gong, PA-C    Allergies    Other  Review of Systems   Review of Systems  Constitutional: Negative for chills and fever.  Respiratory: Negative for shortness of breath.   Cardiovascular: Negative for chest pain.  Gastrointestinal: Negative for abdominal pain and vomiting.  Neurological: Negative for syncope.  Psychiatric/Behavioral: Positive for suicidal ideas. Negative for hallucinations. The patient is nervous/anxious.   All other systems reviewed and are negative.   Physical Exam Updated Vital Signs BP (!) 127/95 (BP Location: Left Arm)    Pulse (!) 110    Temp 98.5 F (36.9 C) (Oral)    Resp 20    Ht 5\' 5"  (1.651 m)    Wt (!) 173 kg    SpO2 99%    BMI 63.47 kg/m   Physical Exam Vitals and nursing note reviewed.  Constitutional:      General: She is not in acute distress.    Appearance: She is well-developed. She is not toxic-appearing.  HENT:     Head: Normocephalic and atraumatic.  Eyes:     General:        Right eye: No discharge.        Left eye: No discharge.     Conjunctiva/sclera: Conjunctivae normal.  Cardiovascular:     Rate and Rhythm: Regular rhythm.  Tachycardia present.  Pulmonary:     Effort: Pulmonary effort is normal. No respiratory distress.     Breath sounds: Normal breath sounds. No wheezing, rhonchi or rales.  Abdominal:     General: There is no distension.     Palpations: Abdomen is soft.     Tenderness: There is no abdominal tenderness.  Musculoskeletal:     Cervical back: Neck supple.  Skin:    General: Skin is warm and dry.     Findings: No rash.  Neurological:     Mental Status: She is alert.     Comments: Clear speech.   Psychiatric:        Mood and Affect: Mood is anxious. Affect is tearful.        Behavior: Behavior is cooperative.  Thought Content: Thought content includes suicidal ideation. Thought content does not include homicidal ideation. Thought content includes suicidal plan. Thought content does not include homicidal plan.     ED Results / Procedures / Treatments   Labs (all labs ordered are listed, but only abnormal results are displayed) Labs Reviewed  COMPREHENSIVE METABOLIC PANEL - Abnormal; Notable for the following components:      Result Value   Glucose, Bld 102 (*)    AST 13 (*)    All other components within normal limits  SALICYLATE LEVEL - Abnormal; Notable for the following components:   Salicylate Lvl <7.0 (*)    All other components within normal limits  ACETAMINOPHEN LEVEL - Abnormal; Notable for the following components:   Acetaminophen (Tylenol), Serum <10 (*)    All other components within normal limits  SARS CORONAVIRUS 2 BY RT PCR (HOSPITAL ORDER, PERFORMED IN [facility])  ETHANOL  CBC  HCG, QUANTITATIVE, PREGNANCY  RAPID URINE DRUG SCREEN, HOSP PERFORMED  I-STAT BETA HCG BLOOD, ED ([facility], [facility], [facility] ONLY)    EKG None  Radiology No results found.  Procedures Procedures (including critical care time)  Medications Ordered in ED Medications  LORazepam (ATIVAN) tablet 1 mg (has no administration in time range)    ED Course  I have reviewed the  triage vital signs and the nursing notes.  Pertinent labs & imaging results that were available during my care of the patient were reviewed by me and considered in my medical decision making (see chart for details).    Whitney Beard was evaluated in Emergency Department on 06/27/2020 for the symptoms described in the history of present illness. He/she was evaluated in the context of the global COVID-19 pandemic, which necessitated consideration that the patient might be at risk for infection with the SARS-CoV-2 virus that causes COVID-19. Institutional protocols and algorithms that pertain to the evaluation of patients at risk for COVID-19 are in a state of rapid change based on information released by regulatory bodies including the CDC and federal and state organizations. These policies and algorithms were followed during the patient's care in the ED.  MDM Rules/Calculators/A&P                         Patient presents to the ED under IVC with suicidal thoughts.  Patient is tearful on arrival, anxious, vitals notable for tachycardia which is mildly improving on my assessment. She would like to something for anxiety- will order 1 mg of ativan orally. Labs ordered, plan for TTS evaluation.   Additional history obtained:  Additional history obtained from IVC paperwork. Previous records obtained and reviewed.   Lab Tests:  I Ordered, reviewed, and interpreted labs, which included:  CBC, CMP, ethanol level, salicylate level, acetaminophen level, pregnancy test, COVID testing: unremarkable. UDS pending   Patient medically cleared for TTS evaluation- disposition per [facility].   Portions of this note were generated with Scientist, clinical (histocompatibility and immunogenetics). Dictation errors may occur despite best attempts at proofreading.  Final Clinical Impression(s) / ED Diagnoses Final diagnoses:  Suicidal ideation    Rx / DC Orders ED Discharge Orders    None       Cherly Anderson, PA-C 06/28/20 0413    Geoffery Lyons, MD 06/28/20 1556

## 2020-06-27 NOTE — ED Triage Notes (Signed)
Came in by law enforcement pt. Is A&O x4 ambulatory. Patients mom  IVC' d her. Pt. Has been off her medication and has not slept in days. Pt. stated she is currently having suicidal thoughts.

## 2020-06-28 ENCOUNTER — Encounter (HOSPITAL_COMMUNITY): Payer: Self-pay | Admitting: Psychiatry

## 2020-06-28 ENCOUNTER — Inpatient Hospital Stay (HOSPITAL_COMMUNITY)
Admission: AD | Admit: 2020-06-28 | Discharge: 2020-07-01 | DRG: 885 | Disposition: A | Discharge status: Home / Self Care | Payer: Medicaid Other | Attending: Psychiatry | Admitting: Psychiatry

## 2020-06-28 DIAGNOSIS — E119 Type 2 diabetes mellitus without complications: Secondary | ICD-10-CM | POA: Diagnosis present

## 2020-06-28 DIAGNOSIS — Z8249 Family history of ischemic heart disease and other diseases of the circulatory system: Secondary | ICD-10-CM

## 2020-06-28 DIAGNOSIS — F603 Borderline personality disorder: Secondary | ICD-10-CM | POA: Diagnosis present

## 2020-06-28 DIAGNOSIS — J45909 Unspecified asthma, uncomplicated: Secondary | ICD-10-CM | POA: Diagnosis present

## 2020-06-28 DIAGNOSIS — F332 Major depressive disorder, recurrent severe without psychotic features: Secondary | ICD-10-CM | POA: Diagnosis not present

## 2020-06-28 DIAGNOSIS — Z9114 Patient's other noncompliance with medication regimen: Secondary | ICD-10-CM | POA: Diagnosis not present

## 2020-06-28 DIAGNOSIS — F339 Major depressive disorder, recurrent, unspecified: Secondary | ICD-10-CM | POA: Diagnosis present

## 2020-06-28 DIAGNOSIS — R45851 Suicidal ideations: Secondary | ICD-10-CM

## 2020-06-28 DIAGNOSIS — Z888 Allergy status to other drugs, medicaments and biological substances status: Secondary | ICD-10-CM | POA: Diagnosis not present

## 2020-06-28 DIAGNOSIS — Z6841 Body Mass Index (BMI) 40.0 and over, adult: Secondary | ICD-10-CM

## 2020-06-28 DIAGNOSIS — Z79899 Other long term (current) drug therapy: Secondary | ICD-10-CM | POA: Diagnosis not present

## 2020-06-28 DIAGNOSIS — F4310 Post-traumatic stress disorder, unspecified: Secondary | ICD-10-CM | POA: Diagnosis present

## 2020-06-28 DIAGNOSIS — Z915 Personal history of self-harm: Secondary | ICD-10-CM | POA: Diagnosis not present

## 2020-06-28 DIAGNOSIS — E669 Obesity, unspecified: Secondary | ICD-10-CM | POA: Diagnosis present

## 2020-06-28 DIAGNOSIS — Z818 Family history of other mental and behavioral disorders: Secondary | ICD-10-CM | POA: Diagnosis not present

## 2020-06-28 DIAGNOSIS — G4730 Sleep apnea, unspecified: Secondary | ICD-10-CM | POA: Diagnosis present

## 2020-06-28 DIAGNOSIS — F909 Attention-deficit hyperactivity disorder, unspecified type: Secondary | ICD-10-CM | POA: Diagnosis present

## 2020-06-28 DIAGNOSIS — Z7951 Long term (current) use of inhaled steroids: Secondary | ICD-10-CM

## 2020-06-28 DIAGNOSIS — F319 Bipolar disorder, unspecified: Principal | ICD-10-CM | POA: Diagnosis present

## 2020-06-28 DIAGNOSIS — F1290 Cannabis use, unspecified, uncomplicated: Secondary | ICD-10-CM | POA: Diagnosis present

## 2020-06-28 DIAGNOSIS — J309 Allergic rhinitis, unspecified: Secondary | ICD-10-CM | POA: Diagnosis present

## 2020-06-28 DIAGNOSIS — F329 Major depressive disorder, single episode, unspecified: Secondary | ICD-10-CM | POA: Diagnosis present

## 2020-06-28 DIAGNOSIS — Z62810 Personal history of physical and sexual abuse in childhood: Secondary | ICD-10-CM | POA: Diagnosis present

## 2020-06-28 LAB — SARS CORONAVIRUS 2 BY RT PCR (HOSPITAL ORDER, PERFORMED IN ~~LOC~~ HOSPITAL LAB): SARS Coronavirus 2: NEGATIVE

## 2020-06-28 MED ORDER — SERTRALINE HCL 25 MG PO TABS
25.0000 mg | ORAL_TABLET | Freq: Every day | ORAL | Status: DC
  Administered 2020-06-28 – 2020-07-01 (×4): 25 mg via ORAL
  Filled 2020-06-28 (×5): qty 1

## 2020-06-28 MED ORDER — ALUM & MAG HYDROXIDE-SIMETH 200-200-20 MG/5ML PO SUSP: 30.0000 mL | ORAL | Status: DC | PRN

## 2020-06-28 MED ORDER — ALBUTEROL SULFATE HFA 108 (90 BASE) MCG/ACT IN AERS
INHALATION_SPRAY | RESPIRATORY_TRACT | Status: AC
  Filled 2020-06-28: qty 6.7

## 2020-06-28 MED ORDER — ALBUTEROL SULFATE (2.5 MG/3ML) 0.083% IN NEBU
2.5000 mg | INHALATION_SOLUTION | Freq: Four times a day (QID) | RESPIRATORY_TRACT | Status: DC | PRN
  Administered 2020-06-28: 2.5 mg via RESPIRATORY_TRACT
  Filled 2020-06-28: qty 3

## 2020-06-28 MED ORDER — ALBUTEROL SULFATE HFA 108 (90 BASE) MCG/ACT IN AERS
2.0000 | INHALATION_SPRAY | RESPIRATORY_TRACT | Status: DC | PRN
  Administered 2020-06-29 – 2020-07-01 (×2): 2 via RESPIRATORY_TRACT

## 2020-06-28 MED ORDER — MOMETASONE FURO-FORMOTEROL FUM 200-5 MCG/ACT IN AERO
2.0000 | INHALATION_SPRAY | Freq: Two times a day (BID) | RESPIRATORY_TRACT | Status: DC
  Administered 2020-06-28 – 2020-07-01 (×7): 2 via RESPIRATORY_TRACT
  Filled 2020-06-28: qty 8.8

## 2020-06-28 MED ORDER — LURASIDONE HCL 20 MG PO TABS
20.0000 mg | ORAL_TABLET | Freq: Every day | ORAL | Status: DC
  Administered 2020-06-29 – 2020-07-01 (×3): 20 mg via ORAL
  Filled 2020-06-28 (×4): qty 1

## 2020-06-28 MED ORDER — MAGNESIUM HYDROXIDE 400 MG/5ML PO SUSP: 30.0000 mL | Freq: Every day | ORAL | Status: DC | PRN

## 2020-06-28 MED ORDER — ACETAMINOPHEN 325 MG PO TABS
650.0000 mg | ORAL_TABLET | Freq: Four times a day (QID) | ORAL | Status: DC | PRN
  Administered 2020-06-28 – 2020-06-30 (×2): 650 mg via ORAL
  Filled 2020-06-28 (×2): qty 2

## 2020-06-28 NOTE — BH Assessment (Signed)
Clinician spoke to [facility], NT/NS about faxing pt's IVC paperwork. TTS will assess pt once IVC paperwork is received.   Dolores Frame, MSW, LCSW-A Triage Specialist [phone]

## 2020-06-28 NOTE — Progress Notes (Signed)
Inpatient Diabetes Program Recommendations  AACE/ADA: New Consensus Statement on Inpatient Glycemic Control (2015)  Target Ranges:  Prepandial:   less than 140 mg/dL      Peak postprandial:   less than 180 mg/dL (1-2 hours)      Critically ill patients:  140 - 180 mg/dL   Lab Results  Component Value Date   GLUCAP 104 (H) 11/13/2019   HGBA1C 5.5 11/13/2019    Review of Glycemic Control  Diabetes history: DM2 Outpatient Diabetes medications: None, previously on metformin 500 mg QAM Current orders for Inpatient glycemic control: None  Inpatient Diabetes Program Recommendations:     Need updated HgbA1C to assess glycemic control prior to admission. Metformin 500 mg QAM with breakfast Novolog 0-9 units tidwc.   Will follow.  Thank you. Ailene Ards, RD, LDN, CDE Inpatient Diabetes Coordinator [phone]

## 2020-06-28 NOTE — ED Notes (Signed)
Patient arrived to TCU towards the end of shift, Not to talkative upon arrival, just stated she was going through some things at this time, Refused anything to eat or drink, went to sleep shortly after, Has bed AT [facility] 302-1 After 0900
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
## 2020-06-28 NOTE — BH Assessment (Signed)
Updated disposition: Per Beryle Flock, RN pt is accepted to [facility] bed/rm 302-1 at 9am. Discussed disposition with Reita Cliche, Charge Nurse and requested to share disposition with EDP.   Dolores Frame, MSW, LCSW-A Triage Specialist [phone]
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
## 2020-06-28 NOTE — BH Assessment (Signed)
Clinician attempted to contact pt's nurse to set up cart for TTS assessment. However,  There was no answer. Clinician will call back.

## 2020-06-28 NOTE — ED Notes (Signed)
Attempted to call report with no answer

## 2020-06-28 NOTE — Tx Team (Cosign Needed)
Initial Treatment Plan 06/28/2020 12:50 PM Whitney Beard KTG:[id number]    PATIENT STRESSORS: Marital or family conflict Medication change or noncompliance Traumatic event   PATIENT STRENGTHS: Average or above average intelligence Communication skills Motivation for treatment/growth Supportive family/friends   PATIENT IDENTIFIED PROBLEMS: anxiety  depression  Suicidal ideations                 DISCHARGE CRITERIA:  Ability to meet basic life and health needs Improved stabilization in mood, thinking, and/or behavior Motivation to continue treatment in a less acute level of care Need for constant or close observation no longer present  PRELIMINARY DISCHARGE PLAN: Attend aftercare/continuing care group Participate in family therapy Return to previous living arrangement  PATIENT/FAMILY INVOLVEMENT: This treatment plan has been presented to and reviewed with the patient, Whitney Beard.  The patient and family have been given the opportunity to ask questions and make suggestions.  Raylene Miyamoto, RN 06/28/2020, 12:50 PM
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
## 2020-06-29 LAB — LIPID PANEL
Cholesterol: 152 mg/dL (ref 0–200)
HDL: 28 mg/dL — ABNORMAL LOW (ref >40)
LDL Cholesterol: 108 mg/dL — ABNORMAL HIGH (ref 0–99)
Total CHOL/HDL Ratio: 5.4 RATIO
Triglycerides: 78 mg/dL (ref <150)
VLDL: 16 mg/dL (ref 0–40)

## 2020-06-29 LAB — HEMOGLOBIN A1C
Hgb A1c MFr Bld: 5 % (ref 4.8–5.6)
Mean Plasma Glucose: 96.8 mg/dL

## 2020-06-29 LAB — TSH: TSH: 0.843 u[IU]/mL (ref 0.350–4.500)

## 2020-06-29 MED ORDER — HYDROXYZINE HCL 25 MG PO TABS
25.0000 mg | ORAL_TABLET | Freq: Three times a day (TID) | ORAL | Status: DC | PRN
  Administered 2020-06-29 – 2020-07-01 (×3): 25 mg via ORAL
  Filled 2020-06-29 (×2): qty 1

## 2020-06-29 NOTE — [facility] Group Notes (Signed)
Adult Psychoeducational Group Note  Date:  06/29/2020 Time:  12:14 PM  Group Topic/Focus:  Goals Group:   The focus of this group is to help patients establish daily goals to achieve during treatment and discuss how the patient can incorporate goal setting into their daily lives to aide in recovery.  Participation Level:  Active  Participation Quality:  Appropriate  Affect:  Appropriate  Cognitive:  Oriented  Insight: Improving  Engagement in Group:  Engaged  Modes of Intervention:  Discussion, Education, Exploration and Support  Additional Comments:  Pt rates her energy as a 7. States she is here because she doesn't want to feel sad anymore.  Dione Housekeeper 06/29/2020, 12:14 PM
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
## 2020-06-29 NOTE — Progress Notes (Signed)
   06/28/20 2100  Psych Admission Type (Psych Patients Only)  Admission Status Involuntary  Psychosocial Assessment  Patient Complaints Anxiety;Depression;Hopelessness;Sadness;Worrying  Eye Contact Brief  Facial Expression Anxious;Pained;Sad;Sullen;Worried  Affect Anxious;Depressed;Sad;Sullen  Surveyor, minerals Activity Other (Comment) (WDL)  Appearance/Hygiene Unremarkable  Behavior Characteristics Cooperative;Anxious;Calm  Mood Depressed;Anxious;Despair;Guilty;Sad;Pleasant  Thought Process  Coherency WDL  Content Blaming self;Blaming others  Delusions None reported or observed  Perception WDL  Hallucination None reported or observed  Judgment Poor  Danger to Self  Current suicidal ideation? Denies  Danger to Others  Danger to Others None reported or observed

## 2020-06-29 NOTE — Progress Notes (Signed)
Pt attend AA wrap up group

## 2020-06-29 NOTE — Progress Notes (Signed)
   06/29/20 2226  Psych Admission Type (Psych Patients Only)  Admission Status Involuntary  Psychosocial Assessment  Patient Complaints Insomnia;Anxiety  Eye Contact Fair  Facial Expression Animated  Affect Appropriate to circumstance  Speech Logical/coherent  Interaction Assertive  Motor Activity Fidgety  Appearance/Hygiene In scrubs  Behavior Characteristics Appropriate to situation  Mood Anxious  Thought Process  Coherency Concrete thinking  Content WDL  Delusions None reported or observed  Perception WDL  Hallucination None reported or observed  Judgment Poor  Confusion None  Danger to Self  Current suicidal ideation? Denies  Danger to Others  Danger to Others None reported or observed

## 2020-06-29 NOTE — Progress Notes (Signed)
Adult Psychoeducational Group Note  Date:  06/29/2020 Time:  9:20 PM  Group Topic/Focus:  Wrap-Up Group:   The focus of this group is to help patients review their daily goal of treatment and discuss progress on daily workbooks.  Participation Level:  Active  Participation Quality:  Appropriate  Affect:  Appropriate  Cognitive:  Appropriate  Insight: Appropriate  Engagement in Group:  Engaged  Modes of Intervention:  Discussion  Additional Comments:  Patient attended group and participated.   Aarionna Germer W Jennalee Greaves 06/29/2020, 9:20 PM

## 2020-06-29 NOTE — Progress Notes (Signed)
   06/28/20 2100  COVID-19 Daily Checkoff  Have you had a fever (temp > 37.80C/100F)  in the past 24 hours?  No  COVID-19 EXPOSURE  Have you traveled outside the state in the past 14 days? No  Have you been in contact with someone with a confirmed diagnosis of COVID-19 or PUI in the past 14 days without wearing appropriate PPE? No  Have you been living in the same home as a person with confirmed diagnosis of COVID-19 or a PUI (household contact)? No  Have you been diagnosed with COVID-19? No
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
## 2020-06-29 NOTE — Progress Notes (Signed)
   06/29/20 2223  COVID-19 Daily Checkoff  Have you had a fever (temp > 37.80C/100F)  in the past 24 hours?  No  If you have had runny nose, nasal congestion, sneezing in the past 24 hours, has it worsened? No  COVID-19 EXPOSURE  Have you traveled outside the state in the past 14 days? No  Have you been in contact with someone with a confirmed diagnosis of COVID-19 or PUI in the past 14 days without wearing appropriate PPE? No  Have you been living in the same home as a person with confirmed diagnosis of COVID-19 or a PUI (household contact)? No  Have you been diagnosed with COVID-19? No

## 2020-06-30 DIAGNOSIS — F332 Major depressive disorder, recurrent severe without psychotic features: Secondary | ICD-10-CM

## 2020-06-30 NOTE — Progress Notes (Signed)
   06/30/20 2248  Psych Admission Type (Psych Patients Only)  Admission Status Involuntary  Psychosocial Assessment  Patient Complaints None  Eye Contact Other (Comment) (appropriate)  Facial Expression Animated  Affect Appropriate to circumstance  Speech Logical/coherent  Interaction Assertive  Motor Activity Other (Comment) (WDL)  Appearance/Hygiene Improved  Behavior Characteristics Appropriate to situation  Mood Pleasant  Thought Process  Coherency Concrete thinking  Content WDL  Delusions None reported or observed  Perception WDL  Hallucination None reported or observed  Judgment Poor  Confusion None  Danger to Self  Current suicidal ideation? Denies  Danger to Others  Danger to Others None reported or observed

## 2020-06-30 NOTE — Progress Notes (Signed)
   06/30/20 2229  COVID-19 Daily Checkoff  Have you had a fever (temp > 37.80C/100F)  in the past 24 hours?  No  If you have had runny nose, nasal congestion, sneezing in the past 24 hours, has it worsened? No  COVID-19 EXPOSURE  Have you traveled outside the state in the past 14 days? No  Have you been in contact with someone with a confirmed diagnosis of COVID-19 or PUI in the past 14 days without wearing appropriate PPE? No  Have you been living in the same home as a person with confirmed diagnosis of COVID-19 or a PUI (household contact)? No  Have you been diagnosed with COVID-19? No

## 2020-06-30 NOTE — [facility] Group Notes (Signed)
Adult Psychoeducational Group Note  Date:  06/30/2020 Time:  1:01 PM  Group Topic/Focus:  Making Healthy Choices:   The focus of this group is to help patients identify negative/unhealthy choices they were using prior to admission and identify positive/healthier coping strategies to replace them upon discharge.  Participation Level:  Active  Participation Quality:  Appropriate  Affect:  Appropriate  Cognitive:  Oriented  Insight: Improving  Engagement in Group:  Engaged  Modes of Intervention:  Discussion, Education and Support  Additional Comments:  Participated fully in the discussions  Dione Housekeeper 06/30/2020, 1:01 PM
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
## 2020-06-30 NOTE — Progress Notes (Signed)
Adult Psychoeducational Group Note  Date:  06/30/2020 Time:  8:52 PM  Group Topic/Focus:  Wrap-Up Group:   The focus of this group is to help patients review their daily goal of treatment and discuss progress on daily workbooks.  Participation Level:  Active  Participation Quality:  Appropriate  Affect:  Appropriate  Cognitive:  Appropriate  Insight: Appropriate  Engagement in Group:  Engaged  Modes of Intervention:  Discussion  Additional Comments:  Patient attended wrap-up group and participated.   Sebasthian Stailey W Seairra Otani 06/30/2020, 8:52 PM

## 2020-06-30 NOTE — [facility] Group Notes (Signed)
Adult Psychoeducational Group Note  Date:  06/30/2020 Time:  11:46 AM  Group Topic/Focus:   Group Topic/Focus: PROGRESSIVE RELAXATION. A group where deep breathing is taught and tensing and relaxation muscle groups is used. Imagery is used as well.  Pts are asked to imagine 3 pillars that hold them up when they are not able to hold themselves up.    Participation Level:  Active  Participation Quality:  Appropriate  Affect:  Appropriate  Cognitive:  Appropriate  Insight: Improving  Engagement in Group:  Engaged  Modes of Intervention:  Activity, Discussion, Education and Support  Additional Comments:  Pt states her energy is a 10 and she said "my three supports is my berst friend, my Dad and my Grandmother. I have learned that not everyone deserves the positive energy I have".  Whitney Beard A 06/30/2020, 11:46 AM

## 2020-06-30 NOTE — Progress Notes (Signed)
Pt presents with an animated affect. She rates depression 4/10 (10 being worst). Anxiety 5/10. Hopelessness 2/10. Pt denies SI/HI. Pt reports fair sleep at bedtime. Pt stated goal for today is to "list things I like about myself." Pt observed engaging on the unit and attending groups. No concerns verbalized by the pt.   Orders reviewed with the pt. Vital signs assessed. Verbal support provided. Pt encouraged to attend groups as tolerated. 15 minute checks performed for safety.   Pt compliant with taking medications and denies any side effects

## 2020-07-01 MED ORDER — HYDROXYZINE HCL 25 MG PO TABS: 25.0000 mg | ORAL_TABLET | Freq: Three times a day (TID) | ORAL | 0 refills | Status: DC | PRN

## 2020-07-01 MED ORDER — SERTRALINE HCL 25 MG PO TABS: 25.0000 mg | ORAL_TABLET | Freq: Every day | ORAL | 0 refills | Status: DC

## 2020-07-01 MED ORDER — LURASIDONE HCL 20 MG PO TABS: 20.0000 mg | ORAL_TABLET | Freq: Every day | ORAL | 0 refills | Status: DC

## 2020-07-01 NOTE — [facility] Suicide Risk Assessment (Signed)
[facility] Discharge Suicide Risk Assessment   Principal Problem: <principal problem not specified> Discharge Diagnoses: Active Problems:   Major depressive disorder, recurrent episode (HCC)   Total Time spent with patient: 15 minutes  Musculoskeletal: Strength & Muscle Tone: within normal limits Gait & Station: normal Patient leans: N/A  Psychiatric Specialty Exam: Review of Systems  All other systems reviewed and are negative.   Blood pressure 117/75, pulse 74, temperature 97.9 F (36.6 C), temperature source Oral, resp. rate 20, height 5\' 6"  (1.676 m), weight (!) 162.4 kg, SpO2 100 %.Body mass index is 57.78 kg/m.  General Appearance: Casual  Eye Contact::  Fair  Speech:  Normal Rate409  Volume:  Normal  Mood:  Euthymic  Affect:  Congruent  Thought Process:  Coherent and Descriptions of Associations: Intact  Orientation:  Full (Time, Place, and Person)  Thought Content:  Logical  Suicidal Thoughts:  No  Homicidal Thoughts:  No  Memory:  Immediate;   Fair Recent;   Fair Remote;   Fair  Judgement:  Intact  Insight:  Fair  Psychomotor Activity:  Normal  Concentration:  Good  Recall:  Good  Fund of Knowledge:Good  Language: Good  Akathisia:  Negative  Handed:  Right  AIMS (if indicated):     Assets:  Desire for Improvement Housing Resilience Social Support  Sleep:  Number of Hours: 6.75  Cognition: WNL  ADL's:  Intact   Mental Status Per Nursing Assessment::   On Admission:  Suicidal ideation indicated by patient, Plan includes specific time, place, or method, Intention to act on suicide plan, Self-harm thoughts, Belief that plan would result in death, Suicide plan  Demographic Factors:  Adolescent or young adult  Loss Factors: NA  Historical Factors: Impulsivity  Risk Reduction Factors:   Sense of responsibility to family, Living with another person, especially a relative, Positive social support and Positive therapeutic relationship  Continued Clinical  Symptoms:  Depression:   Impulsivity  Cognitive Features That Contribute To Risk:  None    Suicide Risk:  Minimal: No identifiable suicidal ideation.  Patients presenting with no risk factors but with morbid ruminations; may be classified as minimal risk based on the severity of the depressive symptoms   Follow-up Information    [facility] Follow up.   Specialty: Behavioral Health Contact information: [address] [place] [phone]              Plan Of Care/Follow-up recommendations:  Activity:  ad lib  [phone], MD 07/01/2020, 9:12 AM
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
## 2020-07-01 NOTE — Tx Team (Signed)
Interdisciplinary Treatment and Diagnostic Plan Update  07/01/2020 Time of Session: 9:30am Whitney Beard MRN: [id number]  Principal Diagnosis: <principal problem not specified>  Secondary Diagnoses: Active Problems:   Major depressive disorder, recurrent episode (HCC)   Current Medications:  Current Facility-Administered Medications  Medication Dose Route Frequency Provider Last Rate Last Admin  . acetaminophen (TYLENOL) tablet 650 mg  650 mg Oral Q6H PRN Armandina Stammer I, NP   650 mg at 06/30/20 2204  . albuterol (PROVENTIL) (2.5 MG/3ML) 0.083% nebulizer solution 2.5 mg  2.5 mg Nebulization Q6H PRN Nwoko, Agnes I, NP   2.5 mg at 06/28/20 1700  . albuterol (VENTOLIN HFA) 108 (90 Base) MCG/ACT inhaler 2 puff  2 puff Inhalation Q4H PRN Cobos, Rockey Situ, MD   2 puff at 06/29/20 0754  . alum & mag hydroxide-simeth (MAALOX/MYLANTA) 200-200-20 MG/5ML suspension 30 mL  30 mL Oral Q4H PRN Nwoko, Agnes I, NP      . hydrOXYzine (ATARAX/VISTARIL) tablet 25 mg  25 mg Oral TID PRN Karsten Ro, MD   25 mg at 07/01/20 0741  . lurasidone (LATUDA) tablet 20 mg  20 mg Oral Q breakfast Cobos, Rockey Situ, MD   20 mg at 07/01/20 0740  . magnesium hydroxide (MILK OF MAGNESIA) suspension 30 mL  30 mL Oral Daily PRN Nwoko, Agnes I, NP      . mometasone-formoterol (DULERA) 200-5 MCG/ACT inhaler 2 puff  2 puff Inhalation BID Cobos, Rockey Situ, MD   2 puff at 07/01/20 0740  . sertraline (ZOLOFT) tablet 25 mg  25 mg Oral Daily Cobos, Rockey Situ, MD   25 mg at 07/01/20 0740   PTA Medications: Medications Prior to Admission  Medication Sig Dispense Refill Last Dose  . albuterol (PROVENTIL) (2.5 MG/3ML) 0.083% nebulizer solution Take 3 mLs (2.5 mg total) by nebulization every 6 (six) hours as needed for wheezing or shortness of breath. 75 mL 2   . albuterol (VENTOLIN HFA) 108 (90 Base) MCG/ACT inhaler Inhale 2 puffs into the lungs every 4 (four) hours as needed for wheezing or shortness of breath. 6.7 g 2   .  fexofenadine (ALLEGRA) 180 MG tablet Take 1 tablet (180 mg total) by mouth at bedtime. (Patient not taking: Reported on 06/27/2020) 30 tablet 1   . hydrOXYzine (ATARAX/VISTARIL) 10 MG tablet Take 1 tablet (10 mg total) by mouth 3 (three) times daily as needed for anxiety. (Patient not taking: Reported on 05/03/2020) 30 tablet 0   . ibuprofen (ADVIL) 200 MG tablet Take 200 mg by mouth every 6 (six) hours as needed for mild pain or cramping.      . lurasidone (LATUDA) 20 MG TABS tablet Take 2 tablets (40 mg total) by mouth daily with breakfast. (Patient not taking: Reported on 05/03/2020) 60 tablet 1   . metFORMIN (GLUCOPHAGE) 500 MG tablet Take 1 tablet (500 mg total) by mouth daily with breakfast. (Patient not taking: Reported on 05/03/2020) 30 tablet 0   . mometasone-formoterol (DULERA) 200-5 MCG/ACT AERO Inhale 2 puffs into the lungs 2 (two) times daily for 30 days. (Patient taking differently: Inhale 2 puffs into the lungs daily. ) 8.8 g 1   . OLANZapine zydis (ZYPREXA) 5 MG disintegrating tablet Take 1 tablet by mouth daily as needed for anxiety.     . triamcinolone (NASACORT) 55 MCG/ACT AERO nasal inhaler Place 2 sprays into the nose daily. (Patient not taking: Reported on 06/27/2020) 1 Inhaler 2     Patient Stressors: Marital or family conflict Medication change or  noncompliance Traumatic event  Patient Strengths: Average or above average intelligence Communication skills Motivation for treatment/growth Supportive family/friends  Treatment Modalities: Medication Management, Group therapy, Case management,  1 to 1 session with clinician, Psychoeducation, Recreational therapy.   Physician Treatment Plan for Primary Diagnosis: <principal problem not specified> Long Term Goal(s): Improvement in symptoms so as ready for discharge Improvement in symptoms so as ready for discharge   Short Term Goals: Ability to identify changes in lifestyle to reduce recurrence of condition will improve Ability to  verbalize feelings will improve Ability to disclose and discuss suicidal ideas Ability to demonstrate self-control will improve Ability to identify and develop effective coping behaviors will improve Ability to maintain clinical measurements within normal limits will improve Compliance with prescribed medications will improve Ability to identify triggers associated with substance abuse/mental health issues will improve Ability to identify changes in lifestyle to reduce recurrence of condition will improve Ability to verbalize feelings will improve Ability to disclose and discuss suicidal ideas Ability to demonstrate self-control will improve Ability to identify and develop effective coping behaviors will improve Ability to maintain clinical measurements within normal limits will improve Compliance with prescribed medications will improve Ability to identify triggers associated with substance abuse/mental health issues will improve  Medication Management: Evaluate patient's response, side effects, and tolerance of medication regimen.  Therapeutic Interventions: 1 to 1 sessions, Unit Group sessions and Medication administration.  Evaluation of Outcomes: Adequate for Discharge  Physician Treatment Plan for Secondary Diagnosis: Active Problems:   Major depressive disorder, recurrent episode (HCC)  Long Term Goal(s): Improvement in symptoms so as ready for discharge Improvement in symptoms so as ready for discharge   Short Term Goals: Ability to identify changes in lifestyle to reduce recurrence of condition will improve Ability to verbalize feelings will improve Ability to disclose and discuss suicidal ideas Ability to demonstrate self-control will improve Ability to identify and develop effective coping behaviors will improve Ability to maintain clinical measurements within normal limits will improve Compliance with prescribed medications will improve Ability to identify triggers  associated with substance abuse/mental health issues will improve Ability to identify changes in lifestyle to reduce recurrence of condition will improve Ability to verbalize feelings will improve Ability to disclose and discuss suicidal ideas Ability to demonstrate self-control will improve Ability to identify and develop effective coping behaviors will improve Ability to maintain clinical measurements within normal limits will improve Compliance with prescribed medications will improve Ability to identify triggers associated with substance abuse/mental health issues will improve     Medication Management: Evaluate patient's response, side effects, and tolerance of medication regimen.  Therapeutic Interventions: 1 to 1 sessions, Unit Group sessions and Medication administration.  Evaluation of Outcomes: Adequate for Discharge   RN Treatment Plan for Primary Diagnosis: <principal problem not specified> Long Term Goal(s): Knowledge of disease and therapeutic regimen to maintain health will improve  Short Term Goals: Ability to identify and develop effective coping behaviors will improve and Compliance with prescribed medications will improve  Medication Management: RN will administer medications as ordered by provider, will assess and evaluate patient's response and provide education to patient for prescribed medication. RN will report any adverse and/or side effects to prescribing provider.  Therapeutic Interventions: 1 on 1 counseling sessions, Psychoeducation, Medication administration, Evaluate responses to treatment, Monitor vital signs and CBGs as ordered, Perform/monitor CIWA, COWS, AIMS and Fall Risk screenings as ordered, Perform wound care treatments as ordered.  Evaluation of Outcomes: Adequate for Discharge   LCSW Treatment Plan for  Primary Diagnosis: <principal problem not specified> Long Term Goal(s): Safe transition to appropriate next level of care at discharge, Engage  patient in therapeutic group addressing interpersonal concerns.  Short Term Goals: Engage patient in aftercare planning with referrals and resources, Increase social support, Identify triggers associated with mental health/substance abuse issues and Increase skills for wellness and recovery  Therapeutic Interventions: Assess for all discharge needs, 1 to 1 time with Social worker, Explore available resources and support systems, Assess for adequacy in community support network, Educate family and significant other(s) on suicide prevention, Complete Psychosocial Assessment, Interpersonal group therapy.  Evaluation of Outcomes: Adequate for Discharge   Progress in Treatment: Attending groups: Yes. Participating in groups: Yes. Taking medication as prescribed: Yes. Toleration medication: Yes. Family/Significant other contact made: No, will contact:  mother and father. Patient understands diagnosis: Yes. Discussing patient identified problems/goals with staff: Yes. Medical problems stabilized or resolved: Yes. Denies suicidal/homicidal ideation: Yes. Issues/concerns per patient self-inventory: No.   New problem(s) identified: No, Describe:  none.  New Short Term/Long Term Goal(s): medication stabilization, elimination of SI thoughts, development of comprehensive mental wellness plan.   Patient Goals:  "To feel like myself again"  Discharge Plan or Barriers: Patient recently admitted. CSW will continue to follow and assess for appropriate referrals and possible discharge planning.   Reason for Continuation of Hospitalization: Medication stabilization  Estimated Length of Stay: Adequate for discharge   Attendees: Patient: Whitney Beard 07/01/2020  Physician: Dr. Jola Babinski 07/01/2020  Nursing:  07/01/2020   RN Care Manager: 07/01/2020   Social Worker: Ruthann Cancer, LCSW 07/01/2020   Recreational Therapist:  07/01/2020   Other:  07/01/2020   Other:  07/01/2020  Other: 07/01/2020     Scribe for  Treatment Team: Otelia Santee, LCSW 07/01/2020 10:05 AM

## 2020-07-01 NOTE — [facility] Suicide Risk Assessment (Signed)
[facility] INPATIENT:  Family/Significant Other Suicide Prevention Education  Suicide Prevention Education: Education Completed; Mother, Laverle Patter [phone]), has been identified by the patient as the family member/significant other with whom the patient will be residing, and identified as the person(s) who will aid the patient in the event of a mental health crisis (suicidal ideations/suicide attempt).  With written consent from the patient, the family member/significant other has been provided the following suicide prevention education, prior to the and/or following the discharge of the patient.   Per patients mother, she has no safety concerns with this patient returning home, however she is concerned with this patient taking her medications post discharge.     Ruthann Cancer MSW, LCSW Clincal Social Worker  [facility]

## 2020-07-01 NOTE — Progress Notes (Signed)
Discharge Note:  Patient denies SI/HI AVH at this time. Discharge instructions, AVS, prescriptions and transition record gone over with patient. Patient agrees to comply with medication management, follow-up visit, and outpatient therapy. Patient belongings returned to patient. Patient questions and concerns addressed and answered.  Patient ambulatory off unit.  Patient discharged to home with dad.

## 2020-07-01 NOTE — Progress Notes (Signed)
Spiritual care group on grief and loss facilitated by chaplain Burnis Kingfisher  Group Goal:  Support / Education around grief and loss Members engage in facilitated group support and psycho-social education.  Group Description:  Following introductions and group rules, group members engaged in facilitated group dialog and support around topic of loss, with particular support around experiences of loss in their lives. Group Identified types of loss (relationships / self / things) and identified patterns, circumstances, and changes that precipitate losses. Reflected on thoughts / feelings around loss, normalized grief responses, and recognized variety in grief experience. Patient Progress:   Present throughout group.  Engaged with other group members around family narrative and lack of space to process grief.  Spoke with another group member about steps she was taking to create space for herself and navigate relationship with mother differently.

## 2020-07-01 NOTE — Progress Notes (Signed)
  [facility] Adult Case Management Discharge Plan :  Will you be returning to the same living situation after discharge:  Yes,  with mom At discharge, do you have transportation home?: Yes,  mom Do you have the ability to pay for your medications: Yes,  medicaid  Release of information consent forms completed and in the chart;  Patient's signature needed at discharge.  Patient to Follow up at:  Follow-up Information    [facility]. Go on 07/16/2020.   Specialty: Behavioral Health Why: You are scheduled for an appointment for therapy on 07/16/20 am. You are also scheduled for medication management on 07/23/20 at 8:00 am.  These appointments will be held in person.  Please arrive 15 minutes prior to your appointment. Contact information: [phone] [place] [address] [phone]              Next level of care provider has access to [facility] Link:yes  Safety Planning and Suicide Prevention discussed: Yes,  yes     Has patient been referred to the Quitline?: N/A patient is not a smoker  Patient has been referred for addiction treatment: N/A  Erin Sons, LCSW 07/01/2020, 10:54 AM

## 2020-07-16 ENCOUNTER — Other Ambulatory Visit: Payer: Self-pay

## 2020-07-16 ENCOUNTER — Ambulatory Visit (HOSPITAL_COMMUNITY): Payer: Medicaid Other | Admitting: Clinical

## 2020-07-23 ENCOUNTER — Ambulatory Visit (HOSPITAL_COMMUNITY): Payer: Medicaid Other | Admitting: Psychiatry

## 2020-07-23 ENCOUNTER — Encounter (HOSPITAL_COMMUNITY): Payer: Self-pay | Admitting: Psychiatry

## 2020-09-12 ENCOUNTER — Encounter (HOSPITAL_COMMUNITY): Payer: Medicaid Other | Admitting: Psychiatry

## 2020-10-09 ENCOUNTER — Emergency Department (HOSPITAL_BASED_OUTPATIENT_CLINIC_OR_DEPARTMENT_OTHER): Payer: Medicaid Other

## 2020-10-09 ENCOUNTER — Other Ambulatory Visit: Payer: Self-pay

## 2020-10-09 ENCOUNTER — Inpatient Hospital Stay (HOSPITAL_BASED_OUTPATIENT_CLINIC_OR_DEPARTMENT_OTHER)
Admission: EM | Admit: 2020-10-09 | Discharge: 2020-10-12 | DRG: 202 | Disposition: A | Discharge status: Home / Self Care | Payer: Medicaid Other | Attending: Internal Medicine | Admitting: Internal Medicine

## 2020-10-09 ENCOUNTER — Encounter (HOSPITAL_BASED_OUTPATIENT_CLINIC_OR_DEPARTMENT_OTHER): Payer: Self-pay | Admitting: Emergency Medicine

## 2020-10-09 DIAGNOSIS — Z6841 Body Mass Index (BMI) 40.0 and over, adult: Secondary | ICD-10-CM

## 2020-10-09 DIAGNOSIS — Z20822 Contact with and (suspected) exposure to covid-19: Secondary | ICD-10-CM | POA: Diagnosis present

## 2020-10-09 DIAGNOSIS — Z888 Allergy status to other drugs, medicaments and biological substances status: Secondary | ICD-10-CM

## 2020-10-09 DIAGNOSIS — Z8249 Family history of ischemic heart disease and other diseases of the circulatory system: Secondary | ICD-10-CM

## 2020-10-09 DIAGNOSIS — J4551 Severe persistent asthma with (acute) exacerbation: Principal | ICD-10-CM | POA: Diagnosis present

## 2020-10-09 DIAGNOSIS — R0602 Shortness of breath: Secondary | ICD-10-CM | POA: Diagnosis present

## 2020-10-09 DIAGNOSIS — I493 Ventricular premature depolarization: Secondary | ICD-10-CM | POA: Diagnosis present

## 2020-10-09 DIAGNOSIS — Z79899 Other long term (current) drug therapy: Secondary | ICD-10-CM

## 2020-10-09 DIAGNOSIS — Z9151 Personal history of suicidal behavior: Secondary | ICD-10-CM

## 2020-10-09 DIAGNOSIS — F419 Anxiety disorder, unspecified: Secondary | ICD-10-CM | POA: Diagnosis present

## 2020-10-09 DIAGNOSIS — J9601 Acute respiratory failure with hypoxia: Secondary | ICD-10-CM | POA: Diagnosis present

## 2020-10-09 DIAGNOSIS — F319 Bipolar disorder, unspecified: Secondary | ICD-10-CM | POA: Diagnosis present

## 2020-10-09 DIAGNOSIS — F909 Attention-deficit hyperactivity disorder, unspecified type: Secondary | ICD-10-CM | POA: Diagnosis present

## 2020-10-09 DIAGNOSIS — F1290 Cannabis use, unspecified, uncomplicated: Secondary | ICD-10-CM | POA: Diagnosis present

## 2020-10-09 DIAGNOSIS — F411 Generalized anxiety disorder: Secondary | ICD-10-CM | POA: Diagnosis present

## 2020-10-09 DIAGNOSIS — Z818 Family history of other mental and behavioral disorders: Secondary | ICD-10-CM

## 2020-10-09 DIAGNOSIS — E119 Type 2 diabetes mellitus without complications: Secondary | ICD-10-CM | POA: Diagnosis present

## 2020-10-09 DIAGNOSIS — J45901 Unspecified asthma with (acute) exacerbation: Secondary | ICD-10-CM | POA: Diagnosis present

## 2020-10-09 LAB — CBC WITH DIFFERENTIAL/PLATELET
Abs Immature Granulocytes: 0.04 10*3/uL (ref 0.00–0.07)
Basophils Absolute: 0.1 10*3/uL (ref 0.0–0.1)
Basophils Relative: 1 %
Eosinophils Absolute: 0.2 10*3/uL (ref 0.0–0.5)
Eosinophils Relative: 2 %
HCT: 38 % (ref 36.0–46.0)
Hemoglobin: 12.7 g/dL (ref 12.0–15.0)
Immature Granulocytes: 1 %
Lymphocytes Relative: 18 %
Lymphs Abs: 1.6 10*3/uL (ref 0.7–4.0)
MCH: 30.4 pg (ref 26.0–34.0)
MCHC: 33.4 g/dL (ref 30.0–36.0)
MCV: 90.9 fL (ref 80.0–100.0)
Monocytes Absolute: 0.7 10*3/uL (ref 0.1–1.0)
Monocytes Relative: 8 %
Neutro Abs: 6.4 10*3/uL (ref 1.7–7.7)
Neutrophils Relative %: 70 %
Platelets: 306 10*3/uL (ref 150–400)
RBC: 4.18 MIL/uL (ref 3.87–5.11)
RDW: 13.3 % (ref 11.5–15.5)
WBC: 8.9 10*3/uL (ref 4.0–10.5)
nRBC: 0 % (ref 0.0–0.2)

## 2020-10-09 LAB — BASIC METABOLIC PANEL
Anion gap: 11 (ref 5–15)
BUN: 8 mg/dL (ref 6–20)
CO2: 24 mmol/L (ref 22–32)
Calcium: 8.6 mg/dL — ABNORMAL LOW (ref 8.9–10.3)
Chloride: 100 mmol/L (ref 98–111)
Creatinine, Ser: 0.79 mg/dL (ref 0.44–1.00)
GFR, Estimated: >60 mL/min (ref >60)
Glucose, Bld: 107 mg/dL — ABNORMAL HIGH (ref 70–99)
Potassium: 3.2 mmol/L — ABNORMAL LOW (ref 3.5–5.1)
Sodium: 135 mmol/L (ref 135–145)

## 2020-10-09 LAB — RESPIRATORY PANEL BY RT PCR (FLU A&B, COVID)
Influenza A by PCR: NEGATIVE
Influenza B by PCR: NEGATIVE
SARS Coronavirus 2 by RT PCR: NEGATIVE

## 2020-10-09 MED ORDER — LORAZEPAM 2 MG/ML IJ SOLN
1.0000 mg | Freq: Once | INTRAMUSCULAR | Status: AC
  Administered 2020-10-09: 1 mg via INTRAVENOUS
  Filled 2020-10-09: qty 1

## 2020-10-09 MED ORDER — ALBUTEROL (5 MG/ML) CONTINUOUS INHALATION SOLN
15.0000 mg/h | INHALATION_SOLUTION | Freq: Once | RESPIRATORY_TRACT | Status: AC
  Administered 2020-10-09: 15 mg/h via RESPIRATORY_TRACT
  Filled 2020-10-09: qty 20

## 2020-10-09 MED ORDER — IPRATROPIUM BROMIDE 0.02 % IN SOLN
0.5000 mg | Freq: Once | RESPIRATORY_TRACT | Status: AC
  Administered 2020-10-09: 0.5 mg via RESPIRATORY_TRACT
  Filled 2020-10-09: qty 2.5

## 2020-10-09 NOTE — ED Notes (Signed)
ED Provider at bedside. 

## 2020-10-09 NOTE — Progress Notes (Signed)
Patient ambulated from the bathroom to room 14 while on pulse ox.  Patient was able to maintain a SPO2 of 93% but her WOB and RR increased and patient felt SOB.

## 2020-10-09 NOTE — ED Triage Notes (Signed)
Pt to ED via [facility] with c/o asthma exacerbation all day today. Pt received neb, mag, and solumedrol with EMS.

## 2020-10-09 NOTE — ED Provider Notes (Signed)
[facility] EMERGENCY DEPARTMENT Provider Note   CSN: [id number] Arrival date & time: 10/09/20  2122     History Chief Complaint  Patient presents with  . Asthma    Whitney Beard is a 19 y.o. female.  Patient is a 19 year old female with a history of asthma, diabetes, morbid obesity, depression who is presenting today with shortness of breath with EMS.  Patient reports for the last 2 days she has had worsening cough and wheezing.  She is used her inhaler at home but it does not seem to be helping.  Tonight the wheezing just became much worse and she is feeling very anxious.  She is feeling short of breath and coughing.  She denies any sputum or fever.  She has not had recent cold symptoms.  She is complaining of cramping all over her body but no leg pain or swelling.  She has no prior history of PE.  She does smoke marijuana but does not smoke cigarettes.  She was not smoking marijuana before her shortness of breath got worse.  She reports in the past she has had to have epinephrine for her breathing but never required intubation.  The history is provided by the patient.  Asthma This is a recurrent problem. The current episode started 2 days ago. The problem occurs constantly. The problem has been rapidly worsening. Associated symptoms include shortness of breath. Associated symptoms comments: Wheezing, pain all over.  No productive cough.  No recent fevers.. The symptoms are aggravated by walking. Nothing relieves the symptoms. Treatments tried: Inhaler. The treatment provided no relief.       Past Medical History:  Diagnosis Date  . ADHD (attention deficit hyperactivity disorder)   . Allergic rhinitis   . Asthma   . Bipolar 1 disorder (HCC)   . Diabetes mellitus without complication (HCC)    states today 03/09/16 "was told in the past that she was borderline diabetic"  . Obesity   . Sleep apnea   . Suicide attempt by drug ingestion [facility](HCC)     Patient Active Problem List     Diagnosis Date Noted  . Major depressive disorder, recurrent episode (HCC) 06/28/2020  . Anxiety   . Asthma 05/17/2019  . Diabetes mellitus type 2 in obese (HCC) 03/09/2019  . Suicide attempt (HCC)   . Hypokalemia 12/26/2018  . OSA (obstructive sleep apnea) 12/26/2018  . Morbid obesity with BMI of 50.0-59.9, adult (HCC) 12/26/2018  . Asthma exacerbation 10/16/2017  . Status asthmaticus 01/19/2017  . 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 MDD (major depressive disorder), recurrent severe, without psychosis (HCC) 03/12/2016  . Overdose, intentional self-harm, initial encounter (HCC)   . Affective psychosis, bipolar (HCC)     Past Surgical History:  Procedure Laterality Date  . ADENOIDECTOMY    . TONSILLECTOMY       OB History   No obstetric history on file.     Family History  Problem Relation Age of Onset  . Bipolar disorder Father   . Schizophrenia Father   . Bipolar disorder Maternal Grandmother   . SIDS Maternal Aunt   . Heart disease Paternal Grandmother     Social History   Tobacco Use  . Smoking status: Never Smoker  . Smokeless tobacco: Never Used  Vaping Use  . Vaping Use: Every day  . Substances: Nicotine  Substance Use Topics  . Alcohol use: Yes    Comment: occ holidays  . Drug use: Yes    Types: Marijuana  Home Medications Prior to Admission medications   Medication Sig Start Date End Date Taking? Authorizing Provider  albuterol (PROVENTIL) (2.5 MG/3ML) 0.083% nebulizer solution Take 3 mLs (2.5 mg total) by nebulization every 6 (six) hours as needed for wheezing or shortness of breath. 05/03/20   Cristina Gong, PA-C  albuterol (VENTOLIN HFA) 108 (90 Base) MCG/ACT inhaler Inhale 2 puffs into the lungs every 4 (four) hours as needed for wheezing or shortness of breath. 05/03/20   Cristina Gong, PA-C  hydrOXYzine (ATARAX/VISTARIL) 25 MG tablet Take 1 tablet (25 mg total) by mouth 3 (three) times daily as needed for anxiety. 07/01/20    Aldean Baker, NP  lurasidone (LATUDA) 20 MG TABS tablet Take 1 tablet (20 mg total) by mouth daily with breakfast. 07/02/20   Aldean Baker, NP  mometasone-formoterol (DULERA) 200-5 MCG/ACT AERO Inhale 2 puffs into the lungs 2 (two) times daily for 30 days. Patient taking differently: Inhale 2 puffs into the lungs daily.  05/18/19 06/27/20  Zigmund Daniel., MD  sertraline (ZOLOFT) 25 MG tablet Take 1 tablet (25 mg total) by mouth daily. 07/02/20   Aldean Baker, NP    Allergies    Other  Review of Systems   Review of Systems  Respiratory: Positive for shortness of breath.   All other systems reviewed and are negative.   Physical Exam Updated Vital Signs BP (!) 140/109   Pulse (!) 133   Resp (!) 27   Ht 5\' 6"  (1.676 m)   Wt (!) 162.4 kg   SpO2 100%   BMI 57.79 kg/m   Physical Exam Vitals and nursing note reviewed.  Constitutional:      General: She is in acute distress.     Appearance: Normal appearance. She is well-developed. She is obese.  HENT:     Head: Normocephalic and atraumatic.     Nose: Nose normal.     Mouth/Throat:     Mouth: Mucous membranes are moist.  Eyes:     Pupils: Pupils are equal, round, and reactive to light.  Cardiovascular:     Rate and Rhythm: Regular rhythm. Tachycardia present.     Heart sounds: Normal heart sounds. No murmur heard.  No friction rub.  Pulmonary:     Effort: Tachypnea and accessory muscle usage present.     Breath sounds: Wheezing present. No rales.  Abdominal:     General: Bowel sounds are normal. There is no distension.     Palpations: Abdomen is soft.     Tenderness: There is no abdominal tenderness. There is no guarding or rebound.  Musculoskeletal:        General: No tenderness. Normal range of motion.     Comments: No edema  Skin:    General: Skin is warm and dry.     Findings: No rash.  Neurological:     General: No focal deficit present.     Mental Status: She is alert and oriented to person, place, and  time. Mental status is at baseline.     Cranial Nerves: No cranial nerve deficit.  Psychiatric:        Mood and Affect: Mood is anxious.        Behavior: Behavior normal.        Thought Content: Thought content normal.     ED Results / Procedures / Treatments   Labs (all labs ordered are listed, but only abnormal results are displayed) Labs Reviewed  BASIC METABOLIC PANEL -  Abnormal; Notable for the following components:      Result Value   Potassium 3.2 (*)    Glucose, Bld 107 (*)    Calcium 8.6 (*)    All other components within normal limits  RESPIRATORY PANEL BY RT PCR (FLU A&B, COVID)  CBC WITH DIFFERENTIAL/PLATELET  PREGNANCY, URINE    EKG EKG Interpretation  Date/Time:  Wednesday October 09 2020 21:35:14 EST Ventricular Rate:  133 PR Interval:    QRS Duration: 93 QT Interval:  283 QTC Calculation: 421 R Axis:   76 Text Interpretation: Sinus tachycardia Ventricular premature complex Repol abnrm suggests ischemia, diffuse leads more pronounced than prior. Confirmed by Gwyneth Sprout ([id number]) on 10/09/2020 9:40:28 PM   Radiology DG Chest Port 1 View  Result Date: 10/09/2020 CLINICAL DATA:  Asthma exacerbation, shortness of breath EXAM: PORTABLE CHEST 1 VIEW COMPARISON:  05/03/2020 FINDINGS: Single frontal view of the chest demonstrates an unremarkable cardiac silhouette. No airspace disease, effusion, or pneumothorax. No acute bony abnormalities. IMPRESSION: 1. No acute intrathoracic process. Electronically Signed   By: Sharlet Salina M.D.   On: 10/09/2020 21:45    Procedures Procedures (including critical care time)  Medications Ordered in ED Medications  albuterol (PROVENTIL,VENTOLIN) solution continuous neb (15 mg/hr Nebulization Given 10/09/20 2147)  LORazepam (ATIVAN) injection 1 mg (1 mg Intravenous Given 10/09/20 2144)  ipratropium (ATROVENT) nebulizer solution 0.5 mg (0.5 mg Nebulization Given 10/09/20 2147)    ED Course  I have reviewed the  triage vital signs and the nursing notes.  Pertinent labs & imaging results that were available during my care of the patient were reviewed by me and considered in my medical decision making (see chart for details).    MDM Rules/Calculators/A&P                          Pt with typical asthma exacerbation  symptoms.  No infectious sx, productive cough or other complaints.  Wheezing on exam.  Given steroids, albuterol/atrovent and mag by EMS.  Patient is tachycardic here and still wheezing diffusely.  She has no prior history of PE.  Oxygen saturation 94 to 95%.  Will place on 1 hour-long continuous albuterol nebulizer.  Patient also complaining of being very anxious and was given a dose of Ativan.  Was also given another dose of Atrovent.  Chest x-ray to rule out spontaneous pneumothorax, EKG shows diffuse repolarization abnormalities but seem to be present on prior EKGs they are just more essential waited with rapid heart rate today.  Labs and Covid are pending.  10:41 PM Labs and cxr are reassuring.  On repeat evaluation after CAT some of the medication spilled but she probably received 10mg  over 45 min and pt still wheezing diffusely but appears more comfortable.  Tachycardic which may be a result of meds.  Will continue to monitor to see whether pt will need admission.  11:53 PM On repeat exam pt's wheezing is better but she remains anxious and tachycardia improved to the 120's.  Given more ativan for anxiety.  Dr. to re-evaluate after meds and assess for  Admission.  MDM Number of Diagnoses or Management Options   Amount and/or Complexity of Data Reviewed Clinical lab tests: ordered and reviewed Tests in the radiology section of CPT: ordered and reviewed Tests in the medicine section of CPT: ordered and reviewed Decide to obtain previous medical records or to obtain history from someone other than the patient: yes Obtain history from someone  other than the patient: yes Review  and summarize past medical records: yes Discuss the patient with other providers: yes Independent visualization of images, tracings, or specimens: yes  Risk of Complications, Morbidity, and/or Mortality Presenting problems: high Diagnostic procedures: moderate Management options: moderate  Patient Progress Patient progress: improved  CRITICAL CARE Performed by: Jyllian Haynie Total critical care time: 30 minutes Critical care time was exclusive of separately billable procedures and treating other patients. Critical care was necessary to treat or prevent imminent or life-threatening deterioration. Critical care was time spent personally by me on the following activities: development of treatment plan with patient and/or surrogate as well as nursing, discussions with consultants, evaluation of patient's response to treatment, examination of patient, obtaining history from patient or surrogate, ordering and performing treatments and interventions, ordering and review of laboratory studies, ordering and review of radiographic studies, pulse oximetry and re-evaluation of patient's condition.    Final Clinical Impression(s) / ED Diagnoses Final diagnoses:  Severe persistent asthma with exacerbation    Rx / DC Orders ED Discharge Orders    None       Gwyneth Sprout, MD 10/09/20 2354

## 2020-10-10 ENCOUNTER — Encounter (HOSPITAL_BASED_OUTPATIENT_CLINIC_OR_DEPARTMENT_OTHER): Payer: Self-pay | Admitting: Internal Medicine

## 2020-10-10 DIAGNOSIS — F909 Attention-deficit hyperactivity disorder, unspecified type: Secondary | ICD-10-CM | POA: Diagnosis present

## 2020-10-10 DIAGNOSIS — J4551 Severe persistent asthma with (acute) exacerbation: Secondary | ICD-10-CM | POA: Diagnosis present

## 2020-10-10 DIAGNOSIS — F1290 Cannabis use, unspecified, uncomplicated: Secondary | ICD-10-CM | POA: Diagnosis present

## 2020-10-10 DIAGNOSIS — Z888 Allergy status to other drugs, medicaments and biological substances status: Secondary | ICD-10-CM | POA: Diagnosis not present

## 2020-10-10 DIAGNOSIS — Z8249 Family history of ischemic heart disease and other diseases of the circulatory system: Secondary | ICD-10-CM | POA: Diagnosis not present

## 2020-10-10 DIAGNOSIS — J45901 Unspecified asthma with (acute) exacerbation: Secondary | ICD-10-CM | POA: Diagnosis present

## 2020-10-10 DIAGNOSIS — Z6841 Body Mass Index (BMI) 40.0 and over, adult: Secondary | ICD-10-CM | POA: Diagnosis not present

## 2020-10-10 DIAGNOSIS — J9601 Acute respiratory failure with hypoxia: Secondary | ICD-10-CM

## 2020-10-10 DIAGNOSIS — Z79899 Other long term (current) drug therapy: Secondary | ICD-10-CM | POA: Diagnosis not present

## 2020-10-10 DIAGNOSIS — F419 Anxiety disorder, unspecified: Secondary | ICD-10-CM

## 2020-10-10 DIAGNOSIS — Z20822 Contact with and (suspected) exposure to covid-19: Secondary | ICD-10-CM | POA: Diagnosis present

## 2020-10-10 DIAGNOSIS — F319 Bipolar disorder, unspecified: Secondary | ICD-10-CM | POA: Diagnosis present

## 2020-10-10 DIAGNOSIS — R0602 Shortness of breath: Secondary | ICD-10-CM

## 2020-10-10 DIAGNOSIS — Z9151 Personal history of suicidal behavior: Secondary | ICD-10-CM | POA: Diagnosis not present

## 2020-10-10 DIAGNOSIS — F411 Generalized anxiety disorder: Secondary | ICD-10-CM | POA: Diagnosis present

## 2020-10-10 DIAGNOSIS — I493 Ventricular premature depolarization: Secondary | ICD-10-CM | POA: Diagnosis present

## 2020-10-10 DIAGNOSIS — Z818 Family history of other mental and behavioral disorders: Secondary | ICD-10-CM | POA: Diagnosis not present

## 2020-10-10 DIAGNOSIS — E119 Type 2 diabetes mellitus without complications: Secondary | ICD-10-CM | POA: Diagnosis present

## 2020-10-10 LAB — BASIC METABOLIC PANEL
Anion gap: 10 (ref 5–15)
BUN: 11 mg/dL (ref 6–20)
CO2: 22 mmol/L (ref 22–32)
Calcium: 9.1 mg/dL (ref 8.9–10.3)
Chloride: 106 mmol/L (ref 98–111)
Creatinine, Ser: 0.62 mg/dL (ref 0.44–1.00)
GFR, Estimated: >60 mL/min (ref >60)
Glucose, Bld: 99 mg/dL (ref 70–99)
Potassium: 4.3 mmol/L (ref 3.5–5.1)
Sodium: 138 mmol/L (ref 135–145)

## 2020-10-10 LAB — MAGNESIUM: Magnesium: 2.3 mg/dL (ref 1.7–2.4)

## 2020-10-10 MED ORDER — ALBUTEROL SULFATE (2.5 MG/3ML) 0.083% IN NEBU
5.0000 mg | INHALATION_SOLUTION | Freq: Once | RESPIRATORY_TRACT | Status: AC
  Administered 2020-10-10: 5 mg via RESPIRATORY_TRACT
  Filled 2020-10-10: qty 6

## 2020-10-10 MED ORDER — SERTRALINE HCL 25 MG PO TABS
25.0000 mg | ORAL_TABLET | Freq: Every day | ORAL | Status: DC
  Administered 2020-10-11 – 2020-10-12 (×2): 25 mg via ORAL
  Filled 2020-10-10 (×2): qty 1

## 2020-10-10 MED ORDER — ACETAMINOPHEN 325 MG PO TABS: 650.0000 mg | ORAL_TABLET | Freq: Four times a day (QID) | ORAL | Status: DC | PRN

## 2020-10-10 MED ORDER — LORATADINE 10 MG PO TABS
10.0000 mg | ORAL_TABLET | Freq: Once | ORAL | Status: AC
  Administered 2020-10-10: 10 mg via ORAL
  Filled 2020-10-10: qty 1

## 2020-10-10 MED ORDER — ACETAMINOPHEN 650 MG RE SUPP: 650.0000 mg | Freq: Four times a day (QID) | RECTAL | Status: DC | PRN

## 2020-10-10 MED ORDER — INSULIN ASPART 100 UNIT/ML ~~LOC~~ SOLN: 0.0000 [IU] | Freq: Three times a day (TID) | SUBCUTANEOUS | Status: DC

## 2020-10-10 MED ORDER — METHYLPREDNISOLONE SODIUM SUCC 125 MG IJ SOLR
125.0000 mg | Freq: Once | INTRAMUSCULAR | Status: AC
  Administered 2020-10-10: 125 mg via INTRAVENOUS
  Filled 2020-10-10: qty 2

## 2020-10-10 MED ORDER — HYDROXYZINE HCL 25 MG PO TABS: 25.0000 mg | ORAL_TABLET | Freq: Three times a day (TID) | ORAL | Status: DC | PRN

## 2020-10-10 MED ORDER — POTASSIUM CHLORIDE 20 MEQ/15ML (10%) PO SOLN
ORAL | Status: AC
  Filled 2020-10-10: qty 15

## 2020-10-10 MED ORDER — ENOXAPARIN SODIUM 80 MG/0.8ML ~~LOC~~ SOLN
80.0000 mg | SUBCUTANEOUS | Status: DC
  Administered 2020-10-11 – 2020-10-12 (×2): 80 mg via SUBCUTANEOUS
  Filled 2020-10-10 (×2): qty 0.8

## 2020-10-10 MED ORDER — POTASSIUM CHLORIDE CRYS ER 20 MEQ PO TBCR: 40.0000 meq | EXTENDED_RELEASE_TABLET | Freq: Once | ORAL | Status: DC

## 2020-10-10 MED ORDER — ALBUTEROL SULFATE (2.5 MG/3ML) 0.083% IN NEBU
INHALATION_SOLUTION | RESPIRATORY_TRACT | Status: AC
  Filled 2020-10-10: qty 6

## 2020-10-10 MED ORDER — ALBUTEROL SULFATE (2.5 MG/3ML) 0.083% IN NEBU: 2.5000 mg | INHALATION_SOLUTION | RESPIRATORY_TRACT | Status: DC | PRN

## 2020-10-10 MED ORDER — IPRATROPIUM-ALBUTEROL 0.5-2.5 (3) MG/3ML IN SOLN
3.0000 mL | Freq: Four times a day (QID) | RESPIRATORY_TRACT | Status: DC
  Administered 2020-10-10 – 2020-10-12 (×6): 3 mL via RESPIRATORY_TRACT
  Filled 2020-10-10 (×7): qty 3

## 2020-10-10 MED ORDER — ONDANSETRON HCL 4 MG/2ML IJ SOLN: 4.0000 mg | Freq: Four times a day (QID) | INTRAMUSCULAR | Status: DC | PRN

## 2020-10-10 MED ORDER — ALBUTEROL SULFATE (2.5 MG/3ML) 0.083% IN NEBU
5.0000 mg | INHALATION_SOLUTION | Freq: Once | RESPIRATORY_TRACT | Status: AC
  Administered 2020-10-10: 5 mg via RESPIRATORY_TRACT

## 2020-10-10 MED ORDER — METHYLPREDNISOLONE SODIUM SUCC 40 MG IJ SOLR
40.0000 mg | Freq: Four times a day (QID) | INTRAMUSCULAR | Status: DC
  Administered 2020-10-10 – 2020-10-12 (×6): 40 mg via INTRAVENOUS
  Filled 2020-10-10 (×5): qty 1

## 2020-10-10 MED ORDER — MOMETASONE FURO-FORMOTEROL FUM 200-5 MCG/ACT IN AERO
2.0000 | INHALATION_SPRAY | Freq: Every day | RESPIRATORY_TRACT | Status: DC
  Administered 2020-10-11: 2 via RESPIRATORY_TRACT
  Filled 2020-10-10: qty 8.8

## 2020-10-10 MED ORDER — LURASIDONE HCL 20 MG PO TABS
20.0000 mg | ORAL_TABLET | Freq: Every day | ORAL | Status: DC
  Administered 2020-10-12: 20 mg via ORAL
  Filled 2020-10-10 (×2): qty 1

## 2020-10-10 MED ORDER — POTASSIUM CHLORIDE 20 MEQ PO PACK
PACK | ORAL | Status: AC
  Filled 2020-10-10: qty 1

## 2020-10-10 NOTE — ED Notes (Signed)
Called [facility] to [facility] Hospitalist spoke to [employer]

## 2020-10-10 NOTE — ED Notes (Signed)
Called [facility] spoke to [place] to page Hospitalist

## 2020-10-10 NOTE — ED Notes (Signed)
Pt sitting in bed talking on phone. Pt is smiling and states she feels better.

## 2020-10-10 NOTE — ED Notes (Signed)
Pt 99% on 3L. Pt decreased to 2L [place].

## 2020-10-10 NOTE — H&P (Signed)
History and Physical    PLEASE NOTE THAT DRAGON DICTATION SOFTWARE WAS USED IN THE CONSTRUCTION OF THIS NOTE.   Whitney Hires SNK:[id number] DOB: [birth year] DOA: 10/09/2020  PCP: Patient, No Pcp Per Patient coming from: home   I have personally briefly reviewed patient's old medical records in [facility]  Chief Complaint: Shortness of breath  HPI: Whitney Beard is a 19 y.o. female with medical history significant for severe persistent asthma, morbid obesity, generalized anxiety disorder, who is admitted to [facility] on 10/09/2020 by way of transfer from med [facility] emergency department with acute asthma exacerbation after presenting from home to the latter facility complaining of shortness of breath.  The patient reports 2 days of shortness of breath associated with wheezing, and new onset nonproductive cough. She denies any associated orthopnea, PND, or worsening of peripheral edema. Denies any associated chest pain, palpitations, diaphoresis, dizziness, presyncope, or syncope.   Denies any associated subjective fever, chills, rigors, or generalized myalgias. Denies any recent headache, neck stiffness, rhinitis, rhinorrhea, sore throat, nausea, vomiting, abdominal pain, diarrhea, or rash. No recent traveling or known COVID-19 exposures. Denies any associated dysuria, gross hematuria, or change in urinary urgency/frequency. No recent trauma. She acknowledges multiple prior acute asthma exacerbations, but states that she has never required intubation associate with any of these episodes.  She reports good compliance with her outpatient respiratory regimen, which consists of Dulera as well as as needed albuterol. She confirms that she is a lifelong non-smoker. She confirms no baseline supplemental oxygen requirements.  In the setting of progression of 2 days of shortness of breath, the patient contacted EMS, who brought the patient to med [facility] emergency  department for further evaluation management of her presenting shortness of breath. In route to med [facility] emergency department, the following were administered by EMS: Solu-Medrol IV, Combivent, and IV magnesium.    [facility] ED Course:  Vital signs in the ED were notable for the following: Tmax 98.2; heart rate initially noted to be 10 4-1 28, with ensuing heart rate improving into the 80s to 90s following hour-long nebulizer treatment administered in the emergency department; blood pressure 136/98 - 153/98; respiratory rate initially noted to be 32-36, which improved to 70-22 following aforementioned hour-long nebulizer treatment; initial oxygen saturation noted to be 89% on room air, which subsequently improved to 95% on 3 L nasal cannula.  Labs were notable for the following: BMP was notable for the following: Sodium 135, bicarbonate 24, creatinine 0.79. CBC notable for white blood cell count of 8900 and hemoglobin 12.7. COVID-19/influenza PCR were checked at the med [facility] emergency department and found to be negative.  Chest x-ray showed no evidence of acute cardiopulmonary process, including no evidence of infiltrate, edema, effusion, or pneumothorax. EKG showed sinus tachycardia with a single PVC and ventricular rate 133, QTc 421, Q-wave inversion in the inferior leads as well as nonspecific less than 1 mm ST depression in leads V3 through V5.  While in the med [facility] ED, the following were administered: Hour-long nebulizer treatment. Subsequently, the patient was transferred to a PCU bed at [facility] for further evaluation of presenting acute asthma exacerbation.     Review of Systems: As per HPI otherwise 10 point review of systems negative.   Past Medical History:  Diagnosis Date  . ADHD (attention deficit hyperactivity disorder)   . Allergic rhinitis   . Asthma   . Bipolar 1 disorder (  HCC)   . Diabetes mellitus without complication (HCC)     states today 03/09/16 "was told in the past that she was borderline diabetic"  . Obesity   . Sleep apnea   . Suicide attempt by drug ingestion [facility])     Past Surgical History:  Procedure Laterality Date  . ADENOIDECTOMY    . TONSILLECTOMY      Social History:  reports that she has never smoked. She has never used smokeless tobacco. She reports current alcohol use. She reports current drug use. Drug: Marijuana.   Allergies  Allergen Reactions  . Other Shortness Of Breath    Pt allergic to animals such as cats and dogs and pollen Reaction: sneezing, asthma trigger     Family History  Problem Relation Age of Onset  . Bipolar disorder Father   . Schizophrenia Father   . Bipolar disorder Maternal Grandmother   . SIDS Maternal Aunt   . Heart disease Paternal Grandmother     Prior to Admission medications   Medication Sig Start Date End Date Taking? Authorizing Provider  albuterol (PROVENTIL) (2.5 MG/3ML) 0.083% nebulizer solution Take 3 mLs (2.5 mg total) by nebulization every 6 (six) hours as needed for wheezing or shortness of breath. 05/03/20  Yes Cristina Gong, PA-C  albuterol (VENTOLIN HFA) 108 (90 Base) MCG/ACT inhaler Inhale 2 puffs into the lungs every 4 (four) hours as needed for wheezing or shortness of breath. 05/03/20  Yes Cristina Gong, PA-C  hydrOXYzine (ATARAX/VISTARIL) 25 MG tablet Take 1 tablet (25 mg total) by mouth 3 (three) times daily as needed for anxiety. 07/01/20  Yes Aldean Baker, NP  lurasidone (LATUDA) 20 MG TABS tablet Take 1 tablet (20 mg total) by mouth daily with breakfast. 07/02/20  Yes Aldean Baker, NP  mometasone-formoterol (DULERA) 200-5 MCG/ACT AERO Inhale 2 puffs into the lungs 2 (two) times daily for 30 days. Patient taking differently: Inhale 2 puffs into the lungs daily.  05/18/19 10/10/20 Yes Zigmund Daniel., MD  sertraline (ZOLOFT) 25 MG tablet Take 1 tablet (25 mg total) by mouth daily. 07/02/20  Yes Aldean Baker, NP      Objective    Physical Exam: Vitals:   10/10/20 2015 10/10/20 2030 10/10/20 2048 10/10/20 2100  BP:  (!) 146/89 (!) 146/89 130/72  Pulse: 93 80 98 93  Resp: 17 20 (!) 24 20  Temp:   98 F (36.7 C) 98.3 F (36.8 C)  TempSrc:      SpO2: 98% 99% 96% 100%  Weight:      Height:        General: appears to be stated age; alert, oriented; increased work of breathing noted Skin: warm, dry, no rash Head:  AT/[place] Mouth:  Oral mucosa membranes appear moist, normal dentition Neck: supple; trachea midline Heart:  RRR; did not appreciate any M/R/G Lungs: CTAB, did not appreciate any wheezes, rales, or rhonchi Abdomen: + BS; soft, ND, NT Vascular: 2+ pedal pulses b/l; 2+ radial pulses b/l Extremities: no peripheral edema, no muscle wasting Neuro: strength and sensation intact in upper and lower extremities b/l   Labs on Admission: I have personally reviewed following labs and imaging studies  CBC: Recent Labs  Lab 10/09/20 2139  WBC 8.9  NEUTROABS 6.4  HGB 12.7  HCT 38.0  MCV 90.9  PLT 306   Basic Metabolic Panel: Recent Labs  Lab 10/09/20 2139  NA 135  K 3.2*  CL 100  CO2 24  GLUCOSE  107*  BUN 8  CREATININE 0.79  CALCIUM 8.6*   GFR: Estimated Creatinine Clearance: 178 mL/min (by C-G formula based on SCr of 0.79 mg/dL). Liver Function Tests: No results for input(s): AST, ALT, ALKPHOS, BILITOT, PROT, ALBUMIN in the last 168 hours. No results for input(s): LIPASE, AMYLASE in the last 168 hours. No results for input(s): AMMONIA in the last 168 hours. Coagulation Profile: No results for input(s): INR, PROTIME in the last 168 hours. Cardiac Enzymes: No results for input(s): CKTOTAL, CKMB, CKMBINDEX, TROPONINI in the last 168 hours. BNP (last 3 results) No results for input(s): PROBNP in the last 8760 hours. HbA1C: No results for input(s): HGBA1C in the last 72 hours. CBG: No results for input(s): GLUCAP in the last 168 hours. Lipid Profile: No results for  input(s): CHOL, HDL, LDLCALC, TRIG, CHOLHDL, LDLDIRECT in the last 72 hours. Thyroid Function Tests: No results for input(s): TSH, T4TOTAL, FREET4, T3FREE, THYROIDAB in the last 72 hours. Anemia Panel: No results for input(s): VITAMINB12, FOLATE, FERRITIN, TIBC, IRON, RETICCTPCT in the last 72 hours. Urine analysis:    Component Value Date/Time   COLORURINE YELLOW 01/24/2017 1140   APPEARANCEUR HAZY (A) 01/24/2017 1140   LABSPEC 1.025 01/24/2017 1140   PHURINE 5.0 01/24/2017 1140   GLUCOSEU NEGATIVE 01/24/2017 1140   HGBUR NEGATIVE 01/24/2017 1140   BILIRUBINUR NEGATIVE 01/24/2017 1140   KETONESUR NEGATIVE 01/24/2017 1140   PROTEINUR NEGATIVE 01/24/2017 1140   UROBILINOGEN 0.2 01/08/2013 2339   NITRITE NEGATIVE 01/24/2017 1140   LEUKOCYTESUR NEGATIVE 01/24/2017 1140    Radiological Exams on Admission: DG Chest Port 1 View  Result Date: 10/09/2020 CLINICAL DATA:  Asthma exacerbation, shortness of breath EXAM: PORTABLE CHEST 1 VIEW COMPARISON:  05/03/2020 FINDINGS: Single frontal view of the chest demonstrates an unremarkable cardiac silhouette. No airspace disease, effusion, or pneumothorax. No acute bony abnormalities. IMPRESSION: 1. No acute intrathoracic process. Electronically Signed   By: Sharlet SalinaMichael  Brown M.D.   On: 10/09/2020 21:45     EKG: Independently reviewed, with result as described above.    Assessment/Plan   Whitney Beard is a 19 y.o. female with medical history significant for severe persistent asthma, morbid obesity, generalized anxiety disorder, who is [facility] on 10/09/2020 by way of transfer from med [facility] emergency department with acute asthma exacerbation after presenting from home to the latter facility complaining of shortness of breath.   Principal Problem:   Acute asthma exacerbation Active Problems:   Asthma exacerbation   Anxiety   SOB (shortness of breath)   Acute respiratory failure with hypoxia (HCC)      #) Acute asthma exacerbation: In the context of a documented history of severe persistent asthma, the patient presents with 2 days of progressive shortness of breath associated with wheezing and new onset nonproductive cough, with evidence of increased work of breathing, as well as acute hypoxic respiratory distress requiring 3 L nasal cannula in order to maintain oxygen saturations greater than or equal to 92% in the context of no baseline supplemental oxygen requirements. No obvious source for presenting exacerbation, including patient's report of outstanding compliance with home respiratory regimen. Additionally, COVID-19/influenza PCR were found to be negative when checked at [facility] emergency department. Furthermore, chest x-ray shows no evidence of acute cardiopulmonary process.  Plan: Solu-Medrol 40 mg IV every 6 hours. Combivent scheduled every 6 hours. As needed albuterol inhaler. Will also continue home Dulera. Monitor continuous pulse oximetry. Monitor telemetry. Check serum magnesium level  as well as serum phosphorus level. Check ABG.     #) Acute hypoxic respiratory distress: In the setting of no baseline supplemental oxygen requirements, patient's initial O2 saturation was noted to be 89% on room air, with subsequent improvement into the mid 90s on 3 L nasal cannula, thereby meeting criteria for acute hypoxic respiratory distress as opposed to acute hypoxic respiratory failure. This appears to be on the basis of presenting acute asthma exacerbation, as further described above. No evidence of additional source acute hypoxic respiratory distress at this time, including COVID-19/influenza PCR found to be negative, as well as chest x-ray showing no evidence of acute cardiopulmonary process. No clinical or radiographic evidence to suggest acutely decompensated heart failure, and ACS appears to be less likely.   Plan: Work-up and management presenting acute asthma exacerbation, as  further described above, including IV Solu-Medrol as well as scheduled/as needed nebulizer treatments. Her on continuous pulse oximetry as well as monitor on telemetry. Check ABG. Repeat EKG. Repeat CBC with differential. Add on serum magnesium level and check serum phosphorus level.      #) Type 2 diabetes mellitus: Appears to be managed via lifestyle modifications at this time. Presenting blood sugar per presenting BMP noted to be 107.  Plan: Check hemoglobin A1c. Have ordered Accu-Cheks before every meal and at bedtime with low-dose sliding scale insulin.     #) Bipolar disorder: On Latuda as an outpatient.  Plan: Continue home Latuda.      #) Generalized anxiety disorder: On Zoloft as well as as needed hydroxyzine as an outpatient.  Plan: Continue home Zoloft as well as as needed hydroxyzine.     DVT prophylaxis: Lovenox 40 mg subcu daily Code Status: Full code Family Communication: none Disposition Plan: Per Rounding Team Consults called: none  Admission status: Inpatient; PCU    PLEASE NOTE THAT DRAGON DICTATION SOFTWARE WAS USED IN THE CONSTRUCTION OF THIS NOTE.   Angie Fava DO [facility] Pager [phone] From 6PM- 2AM  Otherwise, please contact night-coverage  [email or web address] Password [facility]  10/10/2020, 10:11 PM

## 2020-10-10 NOTE — ED Notes (Signed)
Pt's mother at bedside and updated on plan of care.

## 2020-10-11 DIAGNOSIS — J45901 Unspecified asthma with (acute) exacerbation: Secondary | ICD-10-CM

## 2020-10-11 DIAGNOSIS — J9601 Acute respiratory failure with hypoxia: Secondary | ICD-10-CM | POA: Diagnosis present

## 2020-10-11 DIAGNOSIS — R0602 Shortness of breath: Secondary | ICD-10-CM | POA: Diagnosis present

## 2020-10-11 LAB — GLUCOSE, CAPILLARY
Glucose-Capillary: 103 mg/dL — ABNORMAL HIGH (ref 70–99)
Glucose-Capillary: 120 mg/dL — ABNORMAL HIGH (ref 70–99)
Glucose-Capillary: 100 mg/dL — ABNORMAL HIGH (ref 70–99)
Glucose-Capillary: 134 mg/dL — ABNORMAL HIGH (ref 70–99)

## 2020-10-11 LAB — COMPREHENSIVE METABOLIC PANEL
ALT: 15 U/L (ref 0–44)
AST: 14 U/L — ABNORMAL LOW (ref 15–41)
Albumin: 3.9 g/dL (ref 3.5–5.0)
Alkaline Phosphatase: 76 U/L (ref 38–126)
Anion gap: 11 (ref 5–15)
BUN: 12 mg/dL (ref 6–20)
CO2: 22 mmol/L (ref 22–32)
Calcium: 9.1 mg/dL (ref 8.9–10.3)
Chloride: 105 mmol/L (ref 98–111)
Creatinine, Ser: 0.71 mg/dL (ref 0.44–1.00)
GFR, Estimated: >60 mL/min (ref >60)
Glucose, Bld: 101 mg/dL — ABNORMAL HIGH (ref 70–99)
Potassium: 4.1 mmol/L (ref 3.5–5.1)
Sodium: 138 mmol/L (ref 135–145)
Total Bilirubin: 0.7 mg/dL (ref 0.3–1.2)
Total Protein: 7.7 g/dL (ref 6.5–8.1)

## 2020-10-11 LAB — CBC
HCT: 39.2 % (ref 36.0–46.0)
Hemoglobin: 12.8 g/dL (ref 12.0–15.0)
MCH: 30.3 pg (ref 26.0–34.0)
MCHC: 32.7 g/dL (ref 30.0–36.0)
MCV: 92.7 fL (ref 80.0–100.0)
Platelets: 322 10*3/uL (ref 150–400)
RBC: 4.23 MIL/uL (ref 3.87–5.11)
RDW: 13.8 % (ref 11.5–15.5)
WBC: 10 10*3/uL (ref 4.0–10.5)
nRBC: 0 % (ref 0.0–0.2)

## 2020-10-11 LAB — PHOSPHORUS: Phosphorus: 4.6 mg/dL (ref 2.5–4.6)

## 2020-10-11 LAB — HIV ANTIBODY (ROUTINE TESTING W REFLEX): HIV Screen 4th Generation wRfx: NONREACTIVE

## 2020-10-11 LAB — MAGNESIUM: Magnesium: 2.3 mg/dL (ref 1.7–2.4)

## 2020-10-11 LAB — HEMOGLOBIN A1C
Hgb A1c MFr Bld: 5 % (ref 4.8–5.6)
Mean Plasma Glucose: 96.8 mg/dL

## 2020-10-11 MED ORDER — AZITHROMYCIN 250 MG PO TABS
500.0000 mg | ORAL_TABLET | Freq: Every day | ORAL | Status: AC
  Administered 2020-10-11: 500 mg via ORAL
  Filled 2020-10-11: qty 2

## 2020-10-11 MED ORDER — AZITHROMYCIN 250 MG PO TABS: 250.0000 mg | ORAL_TABLET | Freq: Every day | ORAL | Status: DC

## 2020-10-11 MED ORDER — MOMETASONE FURO-FORMOTEROL FUM 200-5 MCG/ACT IN AERO
2.0000 | INHALATION_SPRAY | Freq: Two times a day (BID) | RESPIRATORY_TRACT | Status: DC
  Administered 2020-10-11 – 2020-10-12 (×2): 2 via RESPIRATORY_TRACT
  Filled 2020-10-11: qty 8.8

## 2020-10-11 MED ORDER — LOPERAMIDE HCL 2 MG PO CAPS
4.0000 mg | ORAL_CAPSULE | Freq: Once | ORAL | Status: AC
  Administered 2020-10-11: 4 mg via ORAL
  Filled 2020-10-11: qty 2

## 2020-10-11 NOTE — Progress Notes (Signed)
PROGRESS NOTE  Whitney Beard HXT:[id number] DOB: Apr 12, 2001 DOA: 10/09/2020 PCP: Patient, No Pcp Per  HPI/Recap of past 24 hours:  Whitney Beard is a 19 y.o. female with medical history significant for severe persistent asthma, morbid obesity, generalized anxiety disorder, who is admitted to [facility] on 10/09/2020 by way of transfer from med [facility] emergency department with acute asthma exacerbation after presenting from home with complaints of shortness of breath.  COVID-19/influenza PCR were checked at the med [facility] emergency department and found to be negative.  Chest x-ray showed no evidence of acute cardiopulmonary process, including no evidence of infiltrate, edema, effusion, or pneumothorax. EKG showed sinus tachycardia with a single PVC and ventricular rate 133, QTc 421, Q-wave inversion in the inferior leads as well as nonspecific less than 1 mm ST depression in leads V3 through V5.  10/11/20: She feels better this morning.  Audible wheezing noted on exam.  On IV Solu-Medrol 40 mg every 6 hours, continue.  Continue DuoNeb every 6 hours and home Dulera.  Advised to follow-up with pulmonary at time of discharge, she is receptive.    Assessment/Plan: Principal Problem:   Acute asthma exacerbation Active Problems:   Asthma exacerbation   Anxiety   SOB (shortness of breath)   Acute respiratory failure with hypoxia (HCC)  Acute asthma exacerbation, may have been contributed by [facility] use Self reported occasional use of THC, last use was 4 days prior to presentation. Continue present management with IV Solu-Medrol 40 mg every 6 hours, DuoNeb every 6 hours, home Dulera, mobilize as tolerated Start Z-Pak x5 days.  Severe morbid obesity BMI 57 Recommend weight loss outpatient with regular physical activity and healthy dieting.\ We will need to follow-up with your primary care provider for lifestyle modification  THC use Advised for cessation  to avoid recurrent asthma exacerbation   Code Status: Full code  Family Communication: We will call her mother to give updates.  Disposition Plan: Likely will discharge to home tomorrow 10/12/2020.   Consultants:  None.  Procedures:  None.  Antimicrobials:  Azithromycin  DVT prophylaxis: Subcu Lovenox daily  Status is: Inpatient    Dispo:  Patient From: Home  Planned Disposition: Home  Expected discharge date: 10/12/20  Medically stable for discharge: No, ongoing management of acute asthma exacerbation.         Objective: Vitals:   10/11/20 0743 10/11/20 0801 10/11/20 1138 10/11/20 1228  BP:  (!) 141/71 (!) 146/81   Pulse:  83 80   Resp:  (!) 22 18   Temp:  98 F (36.7 C) 98.4 F (36.9 C)   TempSrc:  Oral Oral   SpO2: 100% 93% 96% 96%  Weight:      Height:       No intake or output data in the 24 hours ending 10/11/20 1508 Filed Weights   10/09/20 2132  Weight: (!) 162.4 kg    Exam:  . General: 19 y.o. year-old female severe morbid obesity in no acute distress.  Alert and oriented x3. . Cardiovascular: Regular rate and rhythm with no rubs or gallops.  No thyromegaly or JVD noted.   Marland Kitchen Respiratory: Diffuse wheezing bilaterally.  Good inspiratory effort.   . Abdomen: Soft nontender nondistended with normal bowel sounds x4 quadrants. . Musculoskeletal: No lower extremity edema. 2/4 pulses in all 4 extremities. . Skin: No ulcerative lesions noted or rashes, . Psychiatry: Mood is appropriate for condition and setting   Data Reviewed: CBC: Recent  Labs  Lab 10/09/20 2139 10/11/20 0506  WBC 8.9 10.0  NEUTROABS 6.4  --   HGB 12.7 12.8  HCT 38.0 39.2  MCV 90.9 92.7  PLT 306 322   Basic Metabolic Panel: Recent Labs  Lab 10/09/20 2139 10/10/20 2241 10/11/20 0506  NA 135 138 138  K 3.2* 4.3 4.1  CL 100 106 105  CO2 24 22 22   GLUCOSE 107* 99 101*  BUN 8 11 12   CREATININE 0.79 0.62 0.71  CALCIUM 8.6* 9.1 9.1  MG  --  2.3 2.3  PHOS   --   --  4.6   GFR: Estimated Creatinine Clearance: 178 mL/min (by C-G formula based on SCr of 0.71 mg/dL). Liver Function Tests: Recent Labs  Lab 10/11/20 0506  AST 14*  ALT 15  ALKPHOS 76  BILITOT 0.7  PROT 7.7  ALBUMIN 3.9   No results for input(s): LIPASE, AMYLASE in the last 168 hours. No results for input(s): AMMONIA in the last 168 hours. Coagulation Profile: No results for input(s): INR, PROTIME in the last 168 hours. Cardiac Enzymes: No results for input(s): CKTOTAL, CKMB, CKMBINDEX, TROPONINI in the last 168 hours. BNP (last 3 results) No results for input(s): PROBNP in the last 8760 hours. HbA1C: Recent Labs    10/11/20 0506  HGBA1C 5.0   CBG: Recent Labs  Lab 10/11/20 0729 10/11/20 1136  GLUCAP 103* 120*   Lipid Profile: No results for input(s): CHOL, HDL, LDLCALC, TRIG, CHOLHDL, LDLDIRECT in the last 72 hours. Thyroid Function Tests: No results for input(s): TSH, T4TOTAL, FREET4, T3FREE, THYROIDAB in the last 72 hours. Anemia Panel: No results for input(s): VITAMINB12, FOLATE, FERRITIN, TIBC, IRON, RETICCTPCT in the last 72 hours. Urine analysis:    Component Value Date/Time   COLORURINE YELLOW 01/24/2017 1140   APPEARANCEUR HAZY (A) 01/24/2017 1140   LABSPEC 1.025 01/24/2017 1140   PHURINE 5.0 01/24/2017 1140   GLUCOSEU NEGATIVE 01/24/2017 1140   HGBUR NEGATIVE 01/24/2017 1140   BILIRUBINUR NEGATIVE 01/24/2017 1140   KETONESUR NEGATIVE 01/24/2017 1140   PROTEINUR NEGATIVE 01/24/2017 1140   UROBILINOGEN 0.2 01/08/2013 2339   NITRITE NEGATIVE 01/24/2017 1140   LEUKOCYTESUR NEGATIVE 01/24/2017 1140   Sepsis Labs: @LABRCNTIP (procalcitonin:4,lacticidven:4)  ) Recent Results (from the past 240 hour(s))  Respiratory Panel by RT PCR (Flu A&B, Covid) - Nasopharyngeal Swab     Status: None   Collection Time: 10/09/20  9:39 PM   Specimen: Nasopharyngeal Swab  Result Value Ref Range Status   SARS Coronavirus 2 by RT PCR NEGATIVE NEGATIVE Final     Comment: (NOTE) SARS-CoV-2 target nucleic acids are NOT DETECTED.  The SARS-CoV-2 RNA is generally detectable in upper respiratoy specimens during the acute phase of infection. The lowest concentration of SARS-CoV-2 viral copies this assay can detect is 131 copies/mL. A negative result does not preclude SARS-Cov-2 infection and should not be used as the sole basis for treatment or other patient management decisions. A negative result may occur with  improper specimen collection/handling, submission of specimen other than nasopharyngeal swab, presence of viral mutation(s) within the areas targeted by this assay, and inadequate number of viral copies (<131 copies/mL). A negative result must be combined with clinical observations, patient history, and epidemiological information. The expected result is Negative.  Fact Sheet for Patients:  01/26/2017  Fact Sheet for Healthcare Providers:   This test is no t yet approved or cleared by the 13/10/21 FDA and  has been authorized for detection and/or diagnosis of SARS-CoV-2  by FDA under an Emergency Use Authorization (EUA). This EUA will remain  in effect (meaning this test can be used) for the duration of the COVID-19 declaration under Section 564(b)(1) of the Act, 21 U.S.C. section 360bbb-3(b)(1), unless the authorization is terminated or revoked sooner.     Influenza A by PCR NEGATIVE NEGATIVE Final   Influenza B by PCR NEGATIVE NEGATIVE Final    Comment: (NOTE) The Xpert Xpress SARS-CoV-2/FLU/RSV assay is intended as an aid in  the diagnosis of influenza from Nasopharyngeal swab specimens and  should not be used as a sole basis for treatment. Nasal washings and  aspirates are unacceptable for Xpert Xpress SARS-CoV-2/FLU/RSV  testing.  Fact Sheet for Patients: https://www.moore.com/  Fact Sheet for Healthcare  Providers: https://www.young.biz/  This test is not yet approved or cleared by the [place] FDA and  has been authorized for detection and/or diagnosis of SARS-CoV-2 by  FDA under an Emergency Use Authorization (EUA). This EUA will remain  in effect (meaning this test can be used) for the duration of the  Covid-19 declaration under Section 564(b)(1) of the Act, 21  U.S.C. section 360bbb-3(b)(1), unless the authorization is  terminated or revoked. Performed at [facility], [address] [place], [place] [address]       Studies: No results found.  Scheduled Meds: . enoxaparin (LOVENOX) injection  80 mg Subcutaneous Q24H  . insulin aspart  0-6 Units Subcutaneous TID WC  . ipratropium-albuterol  3 mL Nebulization QID  . lurasidone  20 mg Oral Q breakfast  . methylPREDNISolone (SOLU-MEDROL) injection  40 mg Intravenous Q6H  . mometasone-formoterol  2 puff Inhalation BID  . sertraline  25 mg Oral Daily    Continuous Infusions:   LOS: 1 day     [name], MD [facility] Pager [phone]  If 7PM-7AM, please contact night-coverage [email or web address] Password [facility] 10/11/2020, 3:08 PM

## 2020-10-11 NOTE — Progress Notes (Signed)
SATURATION QUALIFICATIONS:   Patient Saturations on Room Air at Rest =100 %  Patient Saturations on Room Air while Ambulating = 94%   

## 2020-10-12 LAB — GLUCOSE, CAPILLARY: Glucose-Capillary: 89 mg/dL (ref 70–99)

## 2020-10-12 LAB — TSH: TSH: 0.314 u[IU]/mL — ABNORMAL LOW (ref 0.350–4.500)

## 2020-10-12 MED ORDER — ALBUTEROL SULFATE (2.5 MG/3ML) 0.083% IN NEBU: 2.5000 mg | INHALATION_SOLUTION | Freq: Four times a day (QID) | RESPIRATORY_TRACT | 0 refills | Status: DC | PRN

## 2020-10-12 MED ORDER — PREDNISONE 5 MG PO TABS: ORAL_TABLET | ORAL | 0 refills | Status: AC

## 2020-10-12 MED ORDER — MOMETASONE FURO-FORMOTEROL FUM 200-5 MCG/ACT IN AERO: 2.0000 | INHALATION_SPRAY | Freq: Two times a day (BID) | RESPIRATORY_TRACT | 0 refills | Status: DC

## 2020-10-12 MED ORDER — ALBUTEROL SULFATE HFA 108 (90 BASE) MCG/ACT IN AERS: 2.0000 | INHALATION_SPRAY | RESPIRATORY_TRACT | 0 refills | Status: DC | PRN

## 2020-10-12 MED ORDER — AZITHROMYCIN 250 MG PO TABS
ORAL_TABLET | ORAL | 0 refills | Status: DC
Start: 2020-10-12 — End: 2020-11-10

## 2020-10-12 NOTE — Discharge Summary (Signed)
Discharge Summary  Whitney Beard UUV:[id number] DOB: 07-Oct-2001  PCP: Patient, No Pcp Per  Admit date: 10/09/2020 Discharge date: 10/12/2020  Time spent: 35 minutes  Recommendations for Outpatient Follow-up:  1. Follow up with your PCP in 1-2 weeks 2. Obtain pulmonary referral from your PCP 3. Take your medications as prescribed           Discharge Diagnoses:  Active Hospital Problems   Diagnosis Date Noted  . Acute asthma exacerbation 10/10/2020  . SOB (shortness of breath) 10/11/2020  . Acute respiratory failure with hypoxia (HCC) 10/11/2020  . Anxiety   . Asthma exacerbation 10/16/2017    Resolved Hospital Problems  No resolved problems to display.    Discharge Condition: Stable  Diet recommendation: Resume previous diet   Vitals:   10/11/20 2134 10/12/20 0626  BP: 125/76 133/90  Pulse: 92 65  Resp: 18 18  Temp: 98.9 F (37.2 C) 97.9 F (36.6 C)  SpO2: 97% 95%    History of present illness:  Whitney Beard a 19 y.o.femalewith medical history significant forasthma, morbid obesity, generalized anxiety disorder,who is admitted to [facility] on 11/10/2021by way of transfer from [facility] emergency departmentwith acute asthma exacerbationafter presenting from homewith complaints of shortness of breath.  COVID-19/influenza PCR were checked at the med [facility] emergency department and found to be negative.  Chest x-ray showed no evidence of acute cardiopulmonary process, including no evidence of infiltrate, edema, effusion, or pneumothorax.   On 10/12/20 patient was adamant about being discharged today in order to attend a funeral of a family member who deceased.  She feels a lot better.  She has no new symptoms.  A 12 lead EKG was done which revealed sinus arrythmia, picture shown above.  She was asymptomatic at the time of this visit when 12 lead EKG was done, the writer was present in the room.  Patient was advised  to follow up with her PCP for a close monitoring of this arrythmia.  She is agreeable.  TSH was 0.314 on 10/12/20.  Other lab studies were reviewed and were stable, K+ 4.1, Mg2+ 2.3.  BP 133/90, HR 65, RR 18, O2 sat 97% RA.     Hospital Course:  Principal Problem:   Acute asthma exacerbation Active Problems:   Asthma exacerbation   Anxiety   SOB (shortness of breath)   Acute respiratory failure with hypoxia (HCC)  Resolving, acute asthma exacerbation, may have been contributed by [facility] use Self reported occasional use of THC, last use was 4 days prior to presentation. Received IV Solu-Medrol 40 mg every 6 hours, DuoNeb every 6 hours, home Dulera She mobilized as tolerated Continue bronchodilators, po azithromycin, and steroids taper  Sinus arrythmia, asymptomatic 12 lead EKG done on 10/12/20, picture above Follow up with your PCP in 1-2 weeks for close monitoring.  Low TSH, suspect in the setting of acute illness TSH 0.314 on 10/12/20 Recommend to obtain Free T4 at PCP's office and repeat TSH once acute illness has resolved  Severe morbid obesity BMI 57 Recommend weight loss outpatient with regular physical activity and healthy dieting. Follow-up with your primary care provider for lifestyle modification  THC use Avoid use   Code Status: Full code   Consultants:  None.  Procedures:  None.  Antimicrobials:  Azithromycin     Discharge Exam: BP 133/90 (BP Location: Right Arm)   Pulse 65   Temp 97.9 F (36.6 C) (Oral)   Resp 18   Ht  5\' 6"  (1.676 m)   Wt (!) 150.8 kg   SpO2 95%   BMI 53.66 kg/m  . General: 19 y.o. year-old female well developed well nourished in no acute distress.  Alert and oriented x3. . Cardiovascular: Regular rate and rhythm with no rubs or gallops.  No thyromegaly or JVD noted.   Marland Kitchen. Respiratory: Clear to auscultation with no wheezes or rales. Good inspiratory effort. . Abdomen: Soft nontender nondistended with normal bowel  sounds x4 quadrants. . Musculoskeletal: No lower extremity edema. 2/4 pulses in all 4 extremities. Marland Kitchen. Psychiatry: Mood is appropriate for condition and setting  Discharge Instructions You were cared for by a hospitalist during your hospital stay. If you have any questions about your discharge medications or the care you received while you were in the hospital after you are discharged, you can call the unit and asked to speak with the hospitalist on call if the hospitalist that took care of you is not available. Once you are discharged, your primary care physician will handle any further medical issues. Please note that NO REFILLS for any discharge medications will be authorized once you are discharged, as it is imperative that you return to your primary care physician (or establish a relationship with a primary care physician if you do not have one) for your aftercare needs so that they can reassess your need for medications and monitor your lab values.   Allergies as of 10/12/2020      Reactions   Other Shortness Of Breath   Pt allergic to animals such as cats and dogs and pollen Reaction: sneezing, asthma trigger       Medication List    TAKE these medications   albuterol 108 (90 Base) MCG/ACT inhaler Commonly known as: VENTOLIN HFA Inhale 2 puffs into the lungs every 4 (four) hours as needed for wheezing or shortness of breath.   albuterol (2.5 MG/3ML) 0.083% nebulizer solution Commonly known as: PROVENTIL Take 3 mLs (2.5 mg total) by nebulization every 6 (six) hours as needed for wheezing or shortness of breath.   azithromycin 250 MG tablet Commonly known as: ZITHROMAX Take 1 tablet 250 mg daily x 5 days.   hydrOXYzine 25 MG tablet Commonly known as: ATARAX/VISTARIL Take 1 tablet (25 mg total) by mouth 3 (three) times daily as needed for anxiety.   lurasidone 20 MG Tabs tablet Commonly known as: LATUDA Take 1 tablet (20 mg total) by mouth daily with breakfast.     mometasone-formoterol 200-5 MCG/ACT Aero Commonly known as: DULERA Inhale 2 puffs into the lungs 2 (two) times daily. What changed: when to take this   predniSONE 5 MG tablet Commonly known as: DELTASONE Take 8 tablets (40 mg total) by mouth daily for 2 days, THEN 6 tablets (30 mg total) daily for 2 days, THEN 4 tablets (20 mg total) daily for 2 days, THEN 2 tablets (10 mg total) daily for 2 days, THEN 1 tablet (5 mg total) daily for 2 days. Start taking on: October 12, 2020   sertraline 25 MG tablet Commonly known as: ZOLOFT Take 1 tablet (25 mg total) by mouth daily.      Allergies  Allergen Reactions  . Other Shortness Of Breath    Pt allergic to animals such as cats and dogs and pollen Reaction: sneezing, asthma trigger     Follow-up Information    Denna Haggardurner, Dianne, NP. Call in 1 day(s).   Specialty: Pediatrics Why: please call for a post hospital follow up appointment Contact  information: [address] [place] [address] [phone]                The results of significant diagnostics from this hospitalization (including imaging, microbiology, ancillary and laboratory) are listed below for reference.    Significant Diagnostic Studies: DG Chest Port 1 View  Result Date: 10/09/2020 CLINICAL DATA:  Asthma exacerbation, shortness of breath EXAM: PORTABLE CHEST 1 VIEW COMPARISON:  05/03/2020 FINDINGS: Single frontal view of the chest demonstrates an unremarkable cardiac silhouette. No airspace disease, effusion, or pneumothorax. No acute bony abnormalities. IMPRESSION: 1. No acute intrathoracic process. Electronically Signed   By: Sharlet Salina M.D.   On: 10/09/2020 21:45    Microbiology: Recent Results (from the past 240 hour(s))  Respiratory Panel by RT PCR (Flu A&B, Covid) - Nasopharyngeal Swab     Status: None   Collection Time: 10/09/20  9:39 PM   Specimen: Nasopharyngeal Swab  Result Value Ref Range Status   SARS Coronavirus 2 by RT  PCR NEGATIVE NEGATIVE Final    Comment: (NOTE) SARS-CoV-2 target nucleic acids are NOT DETECTED.  The SARS-CoV-2 RNA is generally detectable in upper respiratoy specimens during the acute phase of infection. The lowest concentration of SARS-CoV-2 viral copies this assay can detect is 131 copies/mL. A negative result does not preclude SARS-Cov-2 infection and should not be used as the sole basis for treatment or other patient management decisions. A negative result may occur with  improper specimen collection/handling, submission of specimen other than nasopharyngeal swab, presence of viral mutation(s) within the areas targeted by this assay, and inadequate number of viral copies (<131 copies/mL). A negative result must be combined with clinical observations, patient history, and epidemiological information. The expected result is Negative.  Fact Sheet for Patients:  https://www.moore.com/  Fact Sheet for Healthcare Providers:  https://www.young.biz/  This test is no t yet approved or cleared by the [place] FDA and  has been authorized for detection and/or diagnosis of SARS-CoV-2 by FDA under an Emergency Use Authorization (EUA). This EUA will remain  in effect (meaning this test can be used) for the duration of the COVID-19 declaration under Section 564(b)(1) of the Act, 21 U.S.C. section 360bbb-3(b)(1), unless the authorization is terminated or revoked sooner.     Influenza A by PCR NEGATIVE NEGATIVE Final   Influenza B by PCR NEGATIVE NEGATIVE Final    Comment: (NOTE) The Xpert Xpress SARS-CoV-2/FLU/RSV assay is intended as an aid in  the diagnosis of influenza from Nasopharyngeal swab specimens and  should not be used as a sole basis for treatment. Nasal washings and  aspirates are unacceptable for Xpert Xpress SARS-CoV-2/FLU/RSV  testing.  Fact Sheet for Patients: https://www.moore.com/  Fact Sheet for  Healthcare Providers: https://www.young.biz/  This test is not yet approved or cleared by the [place] FDA and  has been authorized for detection and/or diagnosis of SARS-CoV-2 by  FDA under an Emergency Use Authorization (EUA). This EUA will remain  in effect (meaning this test can be used) for the duration of the  Covid-19 declaration under Section 564(b)(1) of the Act, 21  U.S.C. section 360bbb-3(b)(1), unless the authorization is  terminated or revoked. Performed at [facility], [address] [place], [place] [address]      Labs: Basic Metabolic Panel: Recent Labs  Lab 10/09/20 2139 10/10/20 2241 10/11/20 0506  NA 135 138 138  K 3.2* 4.3 4.1  CL 100 106 105  CO2 24 22 22   GLUCOSE  107* 99 101*  BUN 8 11 12   CREATININE 0.79 0.62 0.71  CALCIUM 8.6* 9.1 9.1  MG  --  2.3 2.3  PHOS  --   --  4.6   Liver Function Tests: Recent Labs  Lab 10/11/20 0506  AST 14*  ALT 15  ALKPHOS 76  BILITOT 0.7  PROT 7.7  ALBUMIN 3.9   No results for input(s): LIPASE, AMYLASE in the last 168 hours. No results for input(s): AMMONIA in the last 168 hours. CBC: Recent Labs  Lab 10/09/20 2139 10/11/20 0506  WBC 8.9 10.0  NEUTROABS 6.4  --   HGB 12.7 12.8  HCT 38.0 39.2  MCV 90.9 92.7  PLT 306 322   Cardiac Enzymes: No results for input(s): CKTOTAL, CKMB, CKMBINDEX, TROPONINI in the last 168 hours. BNP: BNP (last 3 results) No results for input(s): BNP in the last 8760 hours.  ProBNP (last 3 results) No results for input(s): PROBNP in the last 8760 hours.  CBG: Recent Labs  Lab 10/11/20 0729 10/11/20 1136 10/11/20 1641 10/11/20 2130 10/12/20 0725  GLUCAP 103* 120* 100* 134* 89       Signed:  10/14/20, MD [facility] 10/12/2020, 7:34 AM

## 2020-10-12 NOTE — Progress Notes (Signed)
Patient discharged home with mom, discharge instructions given and explained to patient she verbalized understanding, denies any/distress. No pressure injury noted, accompanied home by mom.

## 2020-10-12 NOTE — Discharge Instructions (Signed)
Asthma Attack  Acute bronchospasm caused by asthma is also referred to as an asthma attack. Bronchospasm means that the air passages become narrowed or "tight," which limits the amount of oxygen that can get into the lungs. The narrowing is caused by inflammation and tightening of the muscles in the air tubes (bronchi) in the lungs. Excessive mucus is also produced, which narrows the airways more. This can cause trouble breathing, coughing, and loud breathing (wheezing). What are the causes? Possible triggers include:  Animal dander from the skin, hair, or feathers of animals.  Dust mites contained in house dust.  Cockroaches.  Pollen from trees or grass.  Mold.  Cigarette or tobacco smoke.  Air pollutants such as dust, household cleaners, hair sprays, aerosol sprays, paint fumes, strong chemicals, or strong odors.  Cold air or weather changes. Cold air may trigger inflammation. Winds increase molds and pollens in the air.  Strong emotions such as crying or laughing hard.  Stress.  Certain medicines, such as aspirin or beta-blockers.  Sulfites in foods and drinks, such as dried fruits and wine.  Infections or inflammatory conditions, such as a flu, a cold, pneumonia, or inflammation of the nasal membranes (rhinitis).  Gastroesophageal reflux disease (GERD). GERD is a condition in which stomach acid backs up into your esophagus, which can irritate nearby airway structures.  Exercise or activity that requires a lot of energy. What are the signs or symptoms? Symptoms of this condition include:  Wheezing. This may sound like whistling while breathing. This may be more noticeable at night.  Excessive coughing, particularly at night.  Chest tightness or pain.  Shortness of breath.  Feeling like you cannot get enough air no matter how hard you try (air hunger). How is this diagnosed? This condition may be diagnosed based on:  Your medical history.  Your symptoms.  A  physical exam.  Tests to check for other causes of your symptoms or other conditions that may have triggered your asthma attack. These tests may include: ? Chest X-ray. ? Blood tests. ? Specialized tests to assess lung function, such as breathing into a device that measures how much air you inhale and exhale (spirometry). How is this treated? The goal of treatment is to open the airways in your lungs and reduce inflammation. Most asthma attacks are treated with medicines that you inhale through a hand-held inhaler (metered dose inhaler, MDI) or a device that turns liquid medicine into a mist that you inhale (nebulizer). Medicines may include:  Quick relief or rescue medicines that relax the muscles of the bronchi. These medicines include bronchodilators, such as albuterol.  Controller medicines, such as inhaled corticosteroids. These are long-acting medicines that are used for daily asthma maintenance. If you have a moderate or severe asthma attack, you may be treated with steroid medicines by mouth or through an IV injection at the hospital. Steroid medicines reduce inflammation in your lungs. Depending on the severity of your attack, you may need oxygen therapy to help you breathe. If your asthma attack was caused by a bacterial infection, such as pneumonia, you will be given antibiotic medicines. Follow these instructions at home: Medicines  Take over-the-counter and prescription medicines only as told by your health care provider. Keep your medicines up-to-date and available.  If you are more than [redacted] weeks pregnant and you are prescribed any new medicines, tell your obstetrician about those medicines.  If you were prescribed an antibiotic medicine, take it as told by your health care provider. Do not   stop taking the antibiotic even if you start to feel better. Avoiding triggers   Keep track of things that trigger your asthma attacks or cause you to have breathing problems, and avoid  exposure to these triggers.  Do not use any products that contain nicotine or tobacco, such as cigarettes and e-cigarettes. If you need help quitting, ask your health care provider.  Avoid secondhand smoke.  Avoid strong smells, such as perfumes, aerosols, and cleaning solvents.  When pollen or air pollution is bad, keep windows closed and use an air conditioner or go to places with air conditioning. Asthma action plan  Work with your health care provider to make a written plan for managing and treating your asthma attacks (asthma action plan). This plan should include: ? A list of your asthma triggers and how to avoid them. ? Information about when your medicines should be taken and when their dosage should be changed. ? Instructions about using a device called a peak flow meter to monitor your condition. A peak flow meter measures how well your lungs are working and measures how severe your asthma is at a given time. Your "personal best" is the highest peak flow rate you can reach when you feel good and have no asthma symptoms. General instructions  Avoid excessive exercise or activity until your asthma attack resolves. Ask your health care provider what activities are safe for you and when you can return to your normal activities.  Stay up to date on all vaccinations recommended by your health care provider, such as flu and pneumonia vaccines.  Drink enough fluid to keep your urine clear or pale yellow. Staying hydrated helps keep mucus in your lungs thin so it can be coughed up easily.  If you drink caffeine, do so in moderation.  Do not use alcohol until you have recovered.  Keep all follow-up visits as told by your health care provider. This is important. Asthma requires careful medical care, and you and your health care provider can work together to reduce the likelihood of future attacks. Contact a health care provider if:  Your peak flow reading is still at 50-79% of your  personal best after you have followed your action plan for 1 hour. This is in the yellow zone, which means "caution."  You need to use a reliever medicine more than 2-3 times a week.  Your medicines are causing side effects, such as: ? Rash. ? Itching. ? Swelling. ? Trouble breathing.  Your symptoms do not improve after 48 hours.  You cough up mucus (sputum) that is thicker than usual.  You have a fever.  You need to use your medicines much more frequently than normal. Get help right away if:  Your peak flow reading is less than 50% of your personal best. This is in the red zone, which means "danger."  You have severe trouble breathing.  You develop chest pain or discomfort.  Your medicines no longer seem to be helping.  You vomit.  You cannot eat or drink without vomiting.  You are coughing up yellow, green, brown, or bloody mucus.  You have a fever and your symptoms suddenly get worse.  You have trouble swallowing.  You feel very tired, and breathing becomes tiring. Summary  Acute bronchospasm caused by asthma is also referred to as an asthma attack.  Bronchospasm is caused by narrowing or tightness in air passages, which causes shortness of breath, coughing, and loud breathing (wheezing).  Many things can trigger an asthma   attack, such as allergens, weather changes, exercise, smoke, and other fumes.  Treatment for an asthma attack may include inhaled rescue medicines for immediate relief, as well as the use of maintenance therapy.  Get help right away if you have worsening shortness of breath, chest pain, or fever, or if your home medicines are no longer helping with your symptoms. This information is not intended to replace advice given to you by your health care provider. Make sure you discuss any questions you have with your health care provider. Document Revised: 03/07/2019 Document Reviewed: 12/18/2016 Elsevier Patient Education  2020 [employer].   Preventing Asthma Attacks From Outdoor Allergens, Teen  When you have asthma, it is important to know what makes your asthma symptoms worse (what triggers your asthma). When you know what triggers your asthma, you can take steps to prevent asthma attacks. Many things found outdoors can trigger asthma, including:  Pollution.  Pollen.  Smoke.  Dust.  Exercise and play.  Cold air. What lifestyle changes can be made? When you spend time outside, do your best to avoid or limit being around the things that trigger your asthma. Pollution One of the most common outdoor asthma triggers is air pollution from truck and car exhaust. To avoid this trigger, exercise and play in places that are away from streets and running vehicles. Pollen Another common outdoor trigger is pollen from plants, flowers, grasses, and trees. On very windy days when pollen counts are high, avoid being outside for very long and keep the windows and doors of your home closed. Smoke An outdoor trigger for most people with asthma is smoke, whether from tobacco products like cigarettes or from campfires. Limit how close and how long you stay by a campfire, and avoid being around people who are smoking. Molds Mold is a common asthma trigger in the fall, when leaves on the ground begin to decompose. If mold is a trigger for your asthma, avoid playing in leaf piles, and pay close attention to your symptoms when doing outdoor chores like raking leaves. Cold air Cold air is a common asthma trigger because it can cause tightening of your airways (bronchospasm). When it is very cold outside, it is better to play and exercise indoors. Why are these changes important? Avoiding asthma triggers will help you to have fewer asthma attacks. It will also reduce how often you need to use your rescue asthma medicine. What can happen if changes are not made? If you do not try to avoid your outdoor asthma triggers, you may feel sick a  lot of the time when you are outside. This may make it harder for you to play or exercise and spend time outdoors. What can I do to lower my risk?   Keep all visits with your health care provider. This is important.  Stay up to date on your vaccinations, and make sure to get a flu shot each year.  Develop an asthma action plan with your health care provider and caregivers. This should involve identifying your asthma triggers.  Always take your asthma medicine as told by your health care provider, and keep your rescue inhaler with you at all times. Sometimes, an attack can happen even if you are not around any of your triggers. How is this treated? Asthma attacks can happen even if you do your best to avoid triggers. If you have an asthma attack, follow the action plan that you developed with your health care provider. Where to find support If  you have questions about your triggers, talk with your health care provider about them. Sometimes, it can be hard for teens with asthma to feel like they fit in at school, in sports, and in other activities. The good news is that lots of teens just like you have asthma. There are support groups and even camps for teens with asthma. Ask your health care provider if there are any support groups or camps near you. Where to find more information Information about outdoor triggers and a template for an asthma action plan can be found on the American Lung Association website:  LocalChronicle.no Information about air quality in your area can be found on the [employer] website:  http://morrow-smith.net/ Summary  Many asthma triggers can be found outdoors.  Learn what your asthma triggers are and how to avoid them.  Work with your family members to remove asthma triggers from around your home when possible.  Make smart choices about when and where to play and exercise outdoors.  If you have  an asthma attack, follow your asthma action plan. This information is not intended to replace advice given to you by your health care provider. Make sure you discuss any questions you have with your health care provider. Document Revised: 11/26/2016 Document Reviewed: 12/01/2015 Elsevier Patient Education  2020 [employer].   Preventing Asthma Attacks From Indoor Allergens, Teen When you have asthma, it is important to know what makes your asthma symptoms worse (what triggers your asthma). When you know what triggers your asthma, you can take steps to prevent asthma attacks. You can also better control your asthma. Many things found indoors can trigger asthma, including:  Certain foods.  Medicines, vitamins, and supplements.  Pets.  Dust and dust mites.  Pests.  Molds. What nutrition changes can be made? Making certain nutrition changes, such as avoiding sulfites and certain vitamins, supplements, and medicines, may help you be in better control of your asthma. Sulfites Sulfites are preservatives that help keep foods from spoiling. Sulfites can trigger an asthma attack in some teens, so it is best to avoid eating foods that have them. To check if a food has sulfites, read its food labels. Look for the word "sulfite" or words that contain "sulfite," like "sodium sulfite" or "potassium bisulfite." Common foods that have high amounts of sulfites include:  Dried fruits.  Canned fruits and vegetables.  Bottled lemon, lime, and grape juices. Vitamins, supplements, and medicines Vitamins, some supplements, and over-the-counter medicines such as cold medicines, aspirin, and ibuprofen can contain things that could trigger your asthma. Before taking any medicines, talk with your health care provider to make sure they will not trigger your asthma. What lifestyle changes can be made? Removing common triggers from your home can help make asthma attacks less severe and make them happen less  often. Pets Pets are a common asthma trigger. Do these things to avoid an asthma attack due to pets:  If you live with a pet, keep it out of your bedroom at all times.  If possible, have your pet live outside.  If you are getting a new pet, avoid getting one with fur or feathers. Pets with fur and feathers are more likely to trigger asthma attacks than pets without them. Dust and dust mites Dust and dust mites are common asthma triggers. To avoid an asthma attack due to these triggers:  Keep your house clean and remove dust often. If possible, have someone do this who does not have asthma.  Put a dust-proof cover on your mattress and pillow.  Every week, wash your bed sheets and any stuffed animals in hot water with detergent and bleach. Cockroaches Cockroaches leave droppings that many people with asthma are allergic to. If you think there are cockroaches in your home, ask to have a pest control expert remove them and spray areas of your home to prevent them from coming back. Molds Mold can develop in areas that are often exposed to moisture or have poor ventilation, such as in the kitchen, bathroom, and basement. If there is mold in your home, ask an adult to remove it right away with a cleaning solution that contains bleach. Irritants Some things can irritate your lungs and make your asthma worse. It is best to avoid them if possible. The following are some common irritants and ways that you can avoid them.  Tobacco smoke. If someone wants to smoke, ask him or her to smoke outside.  Fireplace smoke. If your home is heated with a wood-burning fireplace, avoid spending a lot of time in the room where the fireplace is located.  Vacuum cleaning. Vacuuming should be done by someone who does not have asthma. He or she should use a vacuum that has a HEPA filter. Avoid being in a room when it is being vacuumed.  Strong odors from sprays such as perfume or air fresheners. Avoid using things  that have a strong odor in your bedroom and in areas where you spend a lot of time. Ask others to avoid using them in common areas or when they are around you. Why are these changes important? Avoiding asthma triggers will help you to have fewer asthma attacks. It will also reduce how often you need to use your rescue asthma medicine. What can happen if changes are not made? If you do not try to remove and avoid indoor asthma triggers, you may feel sick a lot of the time. You probably spend a lot of time in your home, and if asthma triggers are there, you may feel sick when you are sleeping and doing other activities. What can I do to lower my risk?   Keep all visits with your health care provider. This is important.  Stay up to date on your vaccinations, and make sure to get a flu shot each year.  Develop an asthma action plan with your health care provider and caregivers. This should involve identifying your asthma triggers.  Always take your asthma medicine as told by your health care provider, and keep your rescue inhaler with you at all times. Sometimes, an attack can happen even if you are not around any of your triggers. How is this treated? Asthma attacks can happen even if you do your best to avoid triggers. If you have an asthma attack, follow the action plan that you developed with your health care provider. Where to find support If you have questions about any medicines, foods, or triggers, talk with your health care provider about them. Sometimes, it can be hard for teens with asthma to feel like they fit in. The good news is that lots of teens just like you have asthma. There are support groups and even camps for teens with asthma. Ask your health care provider if there are any support groups or camps near you. Where to find more information Information about indoor triggers and a template for an asthma action plan can be found on the American Lung Association  website:  LocalChronicle.no Summary  Many  asthma triggers can be found indoors.  Learn what your asthma triggers are and how to avoid them.  Work with your family members to remove asthma triggers from your home.  If you have an asthma attack, follow your asthma action plan. This information is not intended to replace advice given to you by your health care provider. Make sure you discuss any questions you have with your health care provider. Document Revised: 11/26/2016 Document Reviewed: 12/01/2015 Elsevier Patient Education  2020 [employer]

## 2020-11-10 ENCOUNTER — Emergency Department (HOSPITAL_COMMUNITY)
Admission: EM | Admit: 2020-11-10 | Discharge: 2020-11-11 | Disposition: A | Discharge status: Other Acute Inpatient Hospital | Payer: Medicaid Other | Attending: Emergency Medicine | Admitting: Emergency Medicine

## 2020-11-10 DIAGNOSIS — Z7951 Long term (current) use of inhaled steroids: Secondary | ICD-10-CM | POA: Insufficient documentation

## 2020-11-10 DIAGNOSIS — R45851 Suicidal ideations: Secondary | ICD-10-CM | POA: Diagnosis not present

## 2020-11-10 DIAGNOSIS — T43592A Poisoning by other antipsychotics and neuroleptics, intentional self-harm, initial encounter: Secondary | ICD-10-CM | POA: Diagnosis not present

## 2020-11-10 DIAGNOSIS — Z20822 Contact with and (suspected) exposure to covid-19: Secondary | ICD-10-CM | POA: Diagnosis not present

## 2020-11-10 DIAGNOSIS — F329 Major depressive disorder, single episode, unspecified: Secondary | ICD-10-CM | POA: Diagnosis not present

## 2020-11-10 DIAGNOSIS — E119 Type 2 diabetes mellitus without complications: Secondary | ICD-10-CM | POA: Insufficient documentation

## 2020-11-10 DIAGNOSIS — T50902A Poisoning by unspecified drugs, medicaments and biological substances, intentional self-harm, initial encounter: Secondary | ICD-10-CM

## 2020-11-10 DIAGNOSIS — J45909 Unspecified asthma, uncomplicated: Secondary | ICD-10-CM | POA: Insufficient documentation

## 2020-11-10 DIAGNOSIS — T43221A Poisoning by selective serotonin reuptake inhibitors, accidental (unintentional), initial encounter: Secondary | ICD-10-CM | POA: Diagnosis present

## 2020-11-10 DIAGNOSIS — T1491XA Suicide attempt, initial encounter: Secondary | ICD-10-CM

## 2020-11-10 LAB — COMPREHENSIVE METABOLIC PANEL
ALT: 15 U/L (ref 0–44)
AST: 18 U/L (ref 15–41)
Albumin: 3.7 g/dL (ref 3.5–5.0)
Alkaline Phosphatase: 84 U/L (ref 38–126)
Anion gap: 11 (ref 5–15)
BUN: 13 mg/dL (ref 6–20)
CO2: 22 mmol/L (ref 22–32)
Calcium: 9.2 mg/dL (ref 8.9–10.3)
Chloride: 103 mmol/L (ref 98–111)
Creatinine, Ser: 0.8 mg/dL (ref 0.44–1.00)
GFR, Estimated: >60 mL/min (ref >60)
Glucose, Bld: 118 mg/dL — ABNORMAL HIGH (ref 70–99)
Potassium: 4.3 mmol/L (ref 3.5–5.1)
Sodium: 136 mmol/L (ref 135–145)
Total Bilirubin: 0.5 mg/dL (ref 0.3–1.2)
Total Protein: 7.7 g/dL (ref 6.5–8.1)

## 2020-11-10 LAB — CBC WITH DIFFERENTIAL/PLATELET
Abs Immature Granulocytes: 0.03 10*3/uL (ref 0.00–0.07)
Basophils Absolute: 0.1 10*3/uL (ref 0.0–0.1)
Basophils Relative: 1 %
Eosinophils Absolute: 0.1 10*3/uL (ref 0.0–0.5)
Eosinophils Relative: 1 %
HCT: 37.9 % (ref 36.0–46.0)
Hemoglobin: 12.5 g/dL (ref 12.0–15.0)
Immature Granulocytes: 0 %
Lymphocytes Relative: 18 %
Lymphs Abs: 1.9 10*3/uL (ref 0.7–4.0)
MCH: 30.6 pg (ref 26.0–34.0)
MCHC: 33 g/dL (ref 30.0–36.0)
MCV: 92.9 fL (ref 80.0–100.0)
Monocytes Absolute: 0.3 10*3/uL (ref 0.1–1.0)
Monocytes Relative: 3 %
Neutro Abs: 8.4 10*3/uL — ABNORMAL HIGH (ref 1.7–7.7)
Neutrophils Relative %: 77 %
Platelets: 348 10*3/uL (ref 150–400)
RBC: 4.08 MIL/uL (ref 3.87–5.11)
RDW: 13.8 % (ref 11.5–15.5)
WBC: 10.7 10*3/uL — ABNORMAL HIGH (ref 4.0–10.5)
nRBC: 0 % (ref 0.0–0.2)

## 2020-11-10 LAB — ETHANOL: Alcohol, Ethyl (B): <10 mg/dL (ref <10)

## 2020-11-10 LAB — RAPID URINE DRUG SCREEN, HOSP PERFORMED
Amphetamines: NOT DETECTED
Barbiturates: NOT DETECTED
Benzodiazepines: NOT DETECTED
Cocaine: NOT DETECTED
Opiates: NOT DETECTED
Tetrahydrocannabinol: POSITIVE — AB

## 2020-11-10 LAB — RESP PANEL BY RT-PCR (FLU A&B, COVID) ARPGX2
Influenza A by PCR: NEGATIVE
Influenza B by PCR: NEGATIVE
SARS Coronavirus 2 by RT PCR: NEGATIVE

## 2020-11-10 LAB — I-STAT BETA HCG BLOOD, ED (MC, WL, AP ONLY): I-stat hCG, quantitative: <5 m[IU]/mL (ref <5)

## 2020-11-10 LAB — ACETAMINOPHEN LEVEL: Acetaminophen (Tylenol), Serum: <10 ug/mL — ABNORMAL LOW (ref 10–30)

## 2020-11-10 LAB — CBG MONITORING, ED: Glucose-Capillary: 107 mg/dL — ABNORMAL HIGH (ref 70–99)

## 2020-11-10 LAB — SALICYLATE LEVEL: Salicylate Lvl: <7 mg/dL — ABNORMAL LOW (ref 7.0–30.0)

## 2020-11-10 MED ORDER — SERTRALINE HCL 50 MG PO TABS: 25.0000 mg | ORAL_TABLET | Freq: Every day | ORAL | Status: DC

## 2020-11-10 MED ORDER — LURASIDONE HCL 20 MG PO TABS
20.0000 mg | ORAL_TABLET | Freq: Every day | ORAL | Status: DC
  Filled 2020-11-10: qty 1

## 2020-11-10 MED ORDER — ALBUTEROL SULFATE (2.5 MG/3ML) 0.083% IN NEBU: 2.5000 mg | INHALATION_SOLUTION | Freq: Four times a day (QID) | RESPIRATORY_TRACT | Status: DC | PRN

## 2020-11-10 MED ORDER — ONDANSETRON HCL 4 MG/2ML IJ SOLN: 4.0000 mg | Freq: Once | INTRAMUSCULAR | Status: DC

## 2020-11-10 MED ORDER — ALBUTEROL SULFATE HFA 108 (90 BASE) MCG/ACT IN AERS: 2.0000 | INHALATION_SPRAY | RESPIRATORY_TRACT | Status: DC | PRN

## 2020-11-10 MED ORDER — CHARCOAL ACTIVATED PO LIQD
100.0000 g | Freq: Once | ORAL | Status: AC
  Administered 2020-11-10: 02:00:00 100 g via ORAL
  Filled 2020-11-10: qty 480
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
## 2020-11-10 NOTE — ED Notes (Signed)
Poison control called and stated that with given pts VS and labs, pt is stable to be cleared and her case with them will be closed

## 2020-11-10 NOTE — Consult Note (Addendum)
Per Lamount Cranker, [facility] the patient is accepted to [facility] pending negative covid testing and clearance from poison control.  Additional information to follow once results are available for viewing.

## 2020-11-10 NOTE — ED Provider Notes (Signed)
[facility]-EM[facility]ENCY DEPT Provider Note: Lowella Dell, MD, FACEP  CSN: [id number] MRN: [id number] ARRIVAL: 11/10/20 at 0128 ROOM: WA15/WA15   CHIEF COMPLAINT  Drug Overdose  Level 5 caveat: Patient uncooperative HISTORY OF PRESENT ILLNESS  11/10/20 1:51 AM Whitney Beard is a 19 y.o. female who was brought by EMS after taking approximately 45 tablets of sertraline and hydroxyzine in an admitted attempt to kill herself.  She will not communicate with me nor make eye contact.  She did tell staff on arrival that what they were doing was "ridiculous because I am just going to kill myself when I leave here".  It is not known what time she took the reported overdose.   Past Medical History:  Diagnosis Date  . ADHD (attention deficit hyperactivity disorder)   . Allergic rhinitis   . Asthma   . Bipolar 1 disorder (HCC)   . Diabetes mellitus without complication (HCC)    states today 03/09/16 "was told in the past that she was borderline diabetic"  . Obesity   . Sleep apnea   . Suicide attempt by drug ingestion [facility])     Past Surgical History:  Procedure Laterality Date  . ADENOIDECTOMY    . TONSILLECTOMY      Family History  Problem Relation Age of Onset  . Bipolar disorder Father   . Schizophrenia Father   . Bipolar disorder Maternal Grandmother   . SIDS Maternal Aunt   . Heart disease Paternal Grandmother     Social History   Tobacco Use  . Smoking status: Never Smoker  . Smokeless tobacco: Never Used  Vaping Use  . Vaping Use: Every day  . Substances: Nicotine  Substance Use Topics  . Alcohol use: Yes    Comment: occ holidays  . Drug use: Yes    Types: Marijuana    Prior to Admission medications   Medication Sig Start Date End Date Taking? Authorizing Provider  albuterol (PROVENTIL) (2.5 MG/3ML) 0.083% nebulizer solution Take 3 mLs (2.5 mg total) by nebulization every 6 (six) hours as needed for wheezing or shortness of breath. 10/12/20   [name], DO   albuterol (VENTOLIN HFA) 108 (90 Base) MCG/ACT inhaler Inhale 2 puffs into the lungs every 4 (four) hours as needed for wheezing or shortness of breath. 10/12/20   [name], DO  azithromycin (ZITHROMAX) 250 MG tablet Take 1 tablet 250 mg daily x 5 days. 10/12/20   [name], DO  hydrOXYzine (ATARAX/VISTARIL) 25 MG tablet Take 1 tablet (25 mg total) by mouth 3 (three) times daily as needed for anxiety. 07/01/20   Aldean Baker, NP  lurasidone (LATUDA) 20 MG TABS tablet Take 1 tablet (20 mg total) by mouth daily with breakfast. 07/02/20   Aldean Baker, NP  mometasone-formoterol (DULERA) 200-5 MCG/ACT AERO Inhale 2 puffs into the lungs 2 (two) times daily for 30 days. Patient taking differently: Inhale 2 puffs into the lungs daily.  05/18/19 10/10/20  Zigmund Daniel., MD  mometasone-formoterol [facility]) 200-5 MCG/ACT AERO Inhale 2 puffs into the lungs 2 (two) times daily. 10/12/20 11/11/20  [name], DO  sertraline (ZOLOFT) 25 MG tablet Take 1 tablet (25 mg total) by mouth daily. 07/02/20   Aldean Baker, NP    Allergies Other   REVIEW OF SYSTEMS  Negative except as noted here or in the History of Present Illness.   PHYSICAL EXAMINATION  Initial Vital Signs Blood pressure 115/67, pulse 84, temperature 98.5 F (36.9  C), temperature source Oral, resp. rate 18, height 5\' 5"  (1.651 m), weight 136.1 kg, SpO2 100 %.  Examination General: Well-developed, obese female in no acute distress; appearance consistent with age of record HENT: normocephalic; atraumatic Eyes: pupils equal, round and reactive to light; will not make eye contact Neck: supple Heart: regular rate and rhythm Lungs: clear to auscultation bilaterally Abdomen: soft; nondistended; nontender; bowel sounds present Extremities: No deformity; full range of motion; pulses normal Neurologic: Awake, alert; motor function intact in all extremities and symmetric; no facial droop Skin: Warm and dry Psychiatric:  Uncommunicative; cooperative; suicidal ideation   RESULTS  Summary of this visit's results, reviewed and interpreted by myself:  EKG Interpretation:  Date & Time: 11/10/2020 1:45 AM  Rate: 86  Rhythm: normal sinus rhythm  QRS Axis: normal  Intervals: QT prolonged  ST/T Wave abnormalities: normal  Conduction Disutrbances:none  Narrative Interpretation:   Old EKG Reviewed: QT longer  Laboratory Studies: Results for orders placed or performed during the hospital encounter of 11/10/20 (from the past 24 hour(s))  Comprehensive metabolic panel     Status: Abnormal   Collection Time: 11/10/20  1:45 AM  Result Value Ref Range   Sodium 136 135 - 145 mmol/L   Potassium 4.3 3.5 - 5.1 mmol/L   Chloride 103 98 - 111 mmol/L   CO2 22 22 - 32 mmol/L   Glucose, Bld 118 (H) 70 - 99 mg/dL   BUN 13 6 - 20 mg/dL   Creatinine, Ser 14/12/21 0.44 - 1.00 mg/dL   Calcium 9.2 8.9 - 8.67 mg/dL   Total Protein 7.7 6.5 - 8.1 g/dL   Albumin 3.7 3.5 - 5.0 g/dL   AST 18 15 - 41 U/L   ALT 15 0 - 44 U/L   Alkaline Phosphatase 84 38 - 126 U/L   Total Bilirubin 0.5 0.3 - 1.2 mg/dL   GFR, Estimated 67.2 >09 mL/min   Anion gap 11 5 - 15  Salicylate level     Status: Abnormal   Collection Time: 11/10/20  1:45 AM  Result Value Ref Range   Salicylate Lvl <7.0 (L) 7.0 - 30.0 mg/dL  Acetaminophen level     Status: Abnormal   Collection Time: 11/10/20  1:45 AM  Result Value Ref Range   Acetaminophen (Tylenol), Serum <10 (L) 10 - 30 ug/mL  Ethanol     Status: None   Collection Time: 11/10/20  1:45 AM  Result Value Ref Range   Alcohol, Ethyl (B) <10 <10 mg/dL  Urine rapid drug screen (hosp performed)     Status: Abnormal   Collection Time: 11/10/20  1:45 AM  Result Value Ref Range   Opiates NONE DETECTED NONE DETECTED   Cocaine NONE DETECTED NONE DETECTED   Benzodiazepines NONE DETECTED NONE DETECTED   Amphetamines NONE DETECTED NONE DETECTED   Tetrahydrocannabinol POSITIVE (A) NONE DETECTED   Barbiturates  NONE DETECTED NONE DETECTED  CBC WITH DIFFERENTIAL     Status: Abnormal   Collection Time: 11/10/20  1:45 AM  Result Value Ref Range   WBC 10.7 (H) 4.0 - 10.5 K/uL   RBC 4.08 3.87 - 5.11 MIL/uL   Hemoglobin 12.5 12.0 - 15.0 g/dL   HCT 14/12/21 09.6 - 28.3 %   MCV 92.9 80.0 - 100.0 fL   MCH 30.6 26.0 - 34.0 pg   MCHC 33.0 30.0 - 36.0 g/dL   RDW 66.2 94.7 - 65.4 %   Platelets 348 150 - 400 K/uL   nRBC 0.0 0.0 -  0.2 %   Neutrophils Relative % 77 %   Neutro Abs 8.4 (H) 1.7 - 7.7 K/uL   Lymphocytes Relative 18 %   Lymphs Abs 1.9 0.7 - 4.0 K/uL   Monocytes Relative 3 %   Monocytes Absolute 0.3 0.1 - 1.0 K/uL   Eosinophils Relative 1 %   Eosinophils Absolute 0.1 0.0 - 0.5 K/uL   Basophils Relative 1 %   Basophils Absolute 0.1 0.0 - 0.1 K/uL   Immature Granulocytes 0 %   Abs Immature Granulocytes 0.03 0.00 - 0.07 K/uL  CBG monitoring, ED     Status: Abnormal   Collection Time: 11/10/20  2:04 AM  Result Value Ref Range   Glucose-Capillary 107 (H) 70 - 99 mg/dL  I-Stat beta hCG blood, ED     Status: None   Collection Time: 11/10/20  2:05 AM  Result Value Ref Range   I-stat hCG, quantitative <5.0 <5 mIU/mL   Comment 3           Imaging Studies: No results found.  ED COURSE and MDM  Nursing notes, initial and subsequent vitals signs, including pulse oximetry, reviewed and interpreted by myself.  Vitals:   11/10/20 0400 11/10/20 0430 11/10/20 0445 11/10/20 0500  BP: 138/77 (!) 105/59 (!) 109/51 107/62  Pulse: (!) 102 81 77 78  Resp: 16 (!) 9 (!) 22 17  Temp:      TempSrc:      SpO2: 99% 99% 98% 98%  Weight:      Height:       Medications  charcoal activated (NO SORBITOL) (ACTIDOSE-AQUA) suspension 100 g (100 g Oral Given 11/10/20 0224)    2:08 AM Patient placed under involuntary commitment.  2:16 AM Control recommends activated charcoal.  PROCEDURES  Procedures   ED DIAGNOSES     ICD-10-CM   1. Intentional drug overdose, initial encounter (HCC)  T50.902A   2.  Suicidal ideation  R45.851   3. Suicide attempt [facility])  T14.91XA        Paula Libra, MD 11/10/20 [phone]

## 2020-11-10 NOTE — ED Notes (Addendum)
Pt has one pt belonging bag in the cabinet behind nursing station in 13-18 section Pt has tee shirt, pants, bra, shoes, phone charger, phone, and wallet.

## 2020-11-10 NOTE — ED Triage Notes (Signed)
Pt arrives from home via EMS, took about 45 pills of Sertraline and Hydroxyzine in an attempt to killer herself. Active suicidal ideation.

## 2020-11-10 NOTE — ED Notes (Signed)
Called poison control to consult on pt. They suggested activated charcoal and Zofran. Potentially IV fluids if tachycardia develops, and short-acting benzodiazapines for any agitation. 6 hours of monitoring and a repeat Acetaminophen level in 4 hours. MD notified of these suggestions.

## 2020-11-11 ENCOUNTER — Other Ambulatory Visit: Payer: Self-pay

## 2020-11-11 ENCOUNTER — Inpatient Hospital Stay (HOSPITAL_COMMUNITY)
Admission: AD | Admit: 2020-11-11 | Discharge: 2020-11-15 | DRG: 885 | Disposition: A | Discharge status: Home / Self Care | Payer: Medicaid Other | Source: Intra-hospital | Attending: Psychiatry | Admitting: Psychiatry

## 2020-11-11 ENCOUNTER — Encounter (HOSPITAL_COMMUNITY): Payer: Self-pay | Admitting: Adult Health

## 2020-11-11 ENCOUNTER — Other Ambulatory Visit: Payer: Self-pay | Admitting: Student

## 2020-11-11 DIAGNOSIS — Z79899 Other long term (current) drug therapy: Secondary | ICD-10-CM | POA: Diagnosis not present

## 2020-11-11 DIAGNOSIS — F603 Borderline personality disorder: Secondary | ICD-10-CM | POA: Diagnosis present

## 2020-11-11 DIAGNOSIS — Z23 Encounter for immunization: Secondary | ICD-10-CM | POA: Diagnosis not present

## 2020-11-11 DIAGNOSIS — F314 Bipolar disorder, current episode depressed, severe, without psychotic features: Secondary | ICD-10-CM | POA: Diagnosis present

## 2020-11-11 DIAGNOSIS — F1210 Cannabis abuse, uncomplicated: Secondary | ICD-10-CM

## 2020-11-11 DIAGNOSIS — F17290 Nicotine dependence, other tobacco product, uncomplicated: Secondary | ICD-10-CM | POA: Diagnosis present

## 2020-11-11 DIAGNOSIS — F909 Attention-deficit hyperactivity disorder, unspecified type: Secondary | ICD-10-CM | POA: Diagnosis present

## 2020-11-11 DIAGNOSIS — Z825 Family history of asthma and other chronic lower respiratory diseases: Secondary | ICD-10-CM

## 2020-11-11 DIAGNOSIS — Z62810 Personal history of physical and sexual abuse in childhood: Secondary | ICD-10-CM | POA: Diagnosis present

## 2020-11-11 DIAGNOSIS — G4700 Insomnia, unspecified: Secondary | ICD-10-CM | POA: Diagnosis present

## 2020-11-11 DIAGNOSIS — F1220 Cannabis dependence, uncomplicated: Secondary | ICD-10-CM

## 2020-11-11 DIAGNOSIS — Z7289 Other problems related to lifestyle: Secondary | ICD-10-CM | POA: Diagnosis not present

## 2020-11-11 DIAGNOSIS — F419 Anxiety disorder, unspecified: Secondary | ICD-10-CM | POA: Diagnosis present

## 2020-11-11 DIAGNOSIS — Z818 Family history of other mental and behavioral disorders: Secondary | ICD-10-CM | POA: Diagnosis not present

## 2020-11-11 DIAGNOSIS — G4730 Sleep apnea, unspecified: Secondary | ICD-10-CM | POA: Diagnosis present

## 2020-11-11 DIAGNOSIS — Z9151 Personal history of suicidal behavior: Secondary | ICD-10-CM | POA: Diagnosis not present

## 2020-11-11 DIAGNOSIS — F1090 Alcohol use, unspecified, uncomplicated: Secondary | ICD-10-CM

## 2020-11-11 DIAGNOSIS — Z789 Other specified health status: Secondary | ICD-10-CM

## 2020-11-11 DIAGNOSIS — J45909 Unspecified asthma, uncomplicated: Secondary | ICD-10-CM | POA: Diagnosis present

## 2020-11-11 DIAGNOSIS — F319 Bipolar disorder, unspecified: Secondary | ICD-10-CM | POA: Insufficient documentation

## 2020-11-11 DIAGNOSIS — T50902A Poisoning by unspecified drugs, medicaments and biological substances, intentional self-harm, initial encounter: Secondary | ICD-10-CM | POA: Diagnosis present

## 2020-11-11 DIAGNOSIS — F411 Generalized anxiety disorder: Secondary | ICD-10-CM | POA: Diagnosis present

## 2020-11-11 DIAGNOSIS — F332 Major depressive disorder, recurrent severe without psychotic features: Secondary | ICD-10-CM | POA: Diagnosis present

## 2020-11-11 LAB — T4, FREE: Free T4: 1 ng/dL (ref 0.61–1.12)

## 2020-11-11 MED ORDER — NICOTINE 21 MG/24HR TD PT24
21.0000 mg | MEDICATED_PATCH | Freq: Every day | TRANSDERMAL | Status: DC | PRN
  Administered 2020-11-12 – 2020-11-13 (×2): 21 mg via TRANSDERMAL
  Filled 2020-11-11 (×3): qty 1

## 2020-11-11 MED ORDER — LURASIDONE HCL 20 MG PO TABS
20.0000 mg | ORAL_TABLET | Freq: Every day | ORAL | Status: DC
  Administered 2020-11-12: 17:00:00 20 mg via ORAL
  Filled 2020-11-11 (×2): qty 1

## 2020-11-11 MED ORDER — ONDANSETRON 4 MG PO TBDP: 4.0000 mg | ORAL_TABLET | Freq: Four times a day (QID) | ORAL | Status: AC | PRN

## 2020-11-11 MED ORDER — LOPERAMIDE HCL 2 MG PO CAPS: 2.0000 mg | ORAL_CAPSULE | ORAL | Status: AC | PRN

## 2020-11-11 MED ORDER — PNEUMOCOCCAL VAC POLYVALENT 25 MCG/0.5ML IJ INJ
0.5000 mL | INJECTION | INTRAMUSCULAR | Status: AC
  Administered 2020-11-12: 12:00:00 0.5 mL via INTRAMUSCULAR
  Filled 2020-11-11: qty 0.5

## 2020-11-11 MED ORDER — ALUM & MAG HYDROXIDE-SIMETH 200-200-20 MG/5ML PO SUSP
30.0000 mL | ORAL | Status: DC | PRN
Start: 2020-11-11 — End: 2020-11-15

## 2020-11-11 MED ORDER — HYDROXYZINE HCL 25 MG PO TABS
25.0000 mg | ORAL_TABLET | Freq: Three times a day (TID) | ORAL | Status: DC | PRN
Start: 2020-11-11 — End: 2020-11-11
  Administered 2020-11-11 (×2): 25 mg via ORAL
  Filled 2020-11-11 (×2): qty 1

## 2020-11-11 MED ORDER — MAGNESIUM HYDROXIDE 400 MG/5ML PO SUSP: 30.0000 mL | Freq: Every day | ORAL | Status: DC | PRN

## 2020-11-11 MED ORDER — ACETAMINOPHEN 325 MG PO TABS
650.0000 mg | ORAL_TABLET | Freq: Four times a day (QID) | ORAL | Status: DC | PRN
  Administered 2020-11-13: 650 mg via ORAL
  Filled 2020-11-11: qty 2

## 2020-11-11 MED ORDER — ADULT MULTIVITAMIN W/MINERALS CH
1.0000 | ORAL_TABLET | Freq: Every day | ORAL | Status: DC
  Administered 2020-11-11 – 2020-11-15 (×5): 1 via ORAL
  Filled 2020-11-11 (×8): qty 1

## 2020-11-11 MED ORDER — LORAZEPAM 1 MG PO TABS: 1.0000 mg | ORAL_TABLET | Freq: Four times a day (QID) | ORAL | Status: AC | PRN

## 2020-11-11 MED ORDER — ALBUTEROL SULFATE HFA 108 (90 BASE) MCG/ACT IN AERS
2.0000 | INHALATION_SPRAY | RESPIRATORY_TRACT | Status: DC | PRN
  Administered 2020-11-11 – 2020-11-14 (×3): 2 via RESPIRATORY_TRACT
  Filled 2020-11-11: qty 6.7

## 2020-11-11 MED ORDER — INFLUENZA VAC SPLIT QUAD 0.5 ML IM SUSY
0.5000 mL | PREFILLED_SYRINGE | INTRAMUSCULAR | Status: AC
  Administered 2020-11-12: 12:00:00 0.5 mL via INTRAMUSCULAR
  Filled 2020-11-11: qty 0.5

## 2020-11-11 MED ORDER — SERTRALINE HCL 25 MG PO TABS: 25.0000 mg | ORAL_TABLET | Freq: Every day | ORAL | Status: DC

## 2020-11-11 MED ORDER — THIAMINE HCL 100 MG PO TABS
100.0000 mg | ORAL_TABLET | Freq: Every day | ORAL | Status: DC
  Administered 2020-11-12 – 2020-11-15 (×4): 100 mg via ORAL
  Filled 2020-11-11 (×6): qty 1

## 2020-11-11 MED ORDER — HYDROXYZINE HCL 25 MG PO TABS
25.0000 mg | ORAL_TABLET | Freq: Four times a day (QID) | ORAL | Status: DC | PRN
  Administered 2020-11-11 – 2020-11-14 (×4): 25 mg via ORAL
  Filled 2020-11-11: qty 1
  Filled 2020-11-11: qty 10
  Filled 2020-11-11 (×3): qty 1

## 2020-11-11 MED ORDER — MOMETASONE FURO-FORMOTEROL FUM 200-5 MCG/ACT IN AERO
2.0000 | INHALATION_SPRAY | Freq: Two times a day (BID) | RESPIRATORY_TRACT | Status: DC
  Administered 2020-11-11 – 2020-11-15 (×9): 2 via RESPIRATORY_TRACT
  Filled 2020-11-11: qty 8.8

## 2020-11-11 MED ORDER — TRAZODONE HCL 50 MG PO TABS
50.0000 mg | ORAL_TABLET | Freq: Every evening | ORAL | Status: DC | PRN
  Administered 2020-11-11 – 2020-11-14 (×5): 50 mg via ORAL
  Filled 2020-11-11: qty 7
  Filled 2020-11-11 (×5): qty 1

## 2020-11-11 NOTE — Progress Notes (Signed)
The patient rated her day as a 5 out of 10 since her day was neither great or terrible. Her goal for tomorrow is to identify the stressors in her life so that she can address them.

## 2020-11-11 NOTE — ED Notes (Signed)
Dispatch called for pt transport to [facility] [phone]
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
## 2020-11-11 NOTE — Tx Team (Signed)
Interdisciplinary Treatment and Diagnostic Plan Update  11/11/2020 Time of Session: 9:10am  Whitney Beard MRN: [id number]  Principal Diagnosis: <principal problem not specified>  Secondary Diagnoses: Active Problems:   Bipolar 1 disorder (HCC)   Current Medications:  Current Facility-Administered Medications  Medication Dose Route Frequency Provider Last Rate Last Admin  . acetaminophen (TYLENOL) tablet 650 mg  650 mg Oral Q6H PRN Prescilla Sours, PA-C      . albuterol (VENTOLIN HFA) 108 (90 Base) MCG/ACT inhaler 2 puff  2 puff Inhalation Q4H PRN Prescilla Sours, PA-C   2 puff at 11/11/20 0929  . alum & mag hydroxide-simeth (MAALOX/MYLANTA) 200-200-20 MG/5ML suspension 30 mL  30 mL Oral Q4H PRN Prescilla Sours, PA-C      . hydrOXYzine (ATARAX/VISTARIL) tablet 25 mg  25 mg Oral TID PRN Prescilla Sours, PA-C   25 mg at 11/11/20 0234  . [START ON 11/12/2020] influenza vac split quadrivalent PF (FLUARIX) injection 0.5 mL  0.5 mL Intramuscular Tomorrow-1000 Mallie Darting, Cordie Grice, MD      . magnesium hydroxide (MILK OF MAGNESIA) suspension 30 mL  30 mL Oral Daily PRN Prescilla Sours, PA-C      . mometasone-formoterol (DULERA) 200-5 MCG/ACT inhaler 2 puff  2 puff Inhalation BID Margorie John W, PA-C      . [START ON 11/12/2020] pneumococcal 23 valent vaccine (PNEUMOVAX-23) injection 0.5 mL  0.5 mL Intramuscular Tomorrow-1000 Sharma Covert, MD      . traZODone (DESYREL) tablet 50 mg  50 mg Oral QHS PRN Margorie John W, PA-C   50 mg at 11/11/20 0234   PTA Medications: Medications Prior to Admission  Medication Sig Dispense Refill Last Dose  . albuterol (PROVENTIL) (2.5 MG/3ML) 0.083% nebulizer solution Take 3 mLs (2.5 mg total) by nebulization every 6 (six) hours as needed for wheezing or shortness of breath. 75 mL 0   . albuterol (VENTOLIN HFA) 108 (90 Base) MCG/ACT inhaler Inhale 2 puffs into the lungs every 4 (four) hours as needed for wheezing or shortness of breath. 8 g 0   . hydrOXYzine  (ATARAX/VISTARIL) 25 MG tablet Take 1 tablet (25 mg total) by mouth 3 (three) times daily as needed for anxiety. (Patient not taking: No sig reported) 30 tablet 0   . lurasidone (LATUDA) 20 MG TABS tablet Take 1 tablet (20 mg total) by mouth daily with breakfast. (Patient not taking: No sig reported) 30 tablet 0   . mometasone-formoterol (DULERA) 200-5 MCG/ACT AERO Inhale 2 puffs into the lungs 2 (two) times daily. 8.8 g 0   . sertraline (ZOLOFT) 25 MG tablet Take 1 tablet (25 mg total) by mouth daily. (Patient not taking: No sig reported) 30 tablet 0     Patient Stressors:    Patient Strengths:    Treatment Modalities: Medication Management, Group therapy, Case management,  1 to 1 session with clinician, Psychoeducation, Recreational therapy.   Physician Treatment Plan for Primary Diagnosis: <principal problem not specified> Long Term Goal(s):     Short Term Goals:    Medication Management: Evaluate patient's response, side effects, and tolerance of medication regimen.  Therapeutic Interventions: 1 to 1 sessions, Unit Group sessions and Medication administration.  Evaluation of Outcomes: Not Met  Physician Treatment Plan for Secondary Diagnosis: Active Problems:   Bipolar 1 disorder ([place])  Long Term Goal(s):     Short Term Goals:       Medication Management: Evaluate patient's response, side effects, and tolerance of medication regimen.  Therapeutic Interventions:  1 to 1 sessions, Unit Group sessions and Medication administration.  Evaluation of Outcomes: Not Met   RN Treatment Plan for Primary Diagnosis: <principal problem not specified> Long Term Goal(s): Knowledge of disease and therapeutic regimen to maintain health will improve  Short Term Goals: Ability to remain free from injury will improve, Ability to participate in decision making will improve, Ability to verbalize feelings will improve, Ability to disclose and discuss suicidal ideas and Ability to identify and  develop effective coping behaviors will improve  Medication Management: RN will administer medications as ordered by provider, will assess and evaluate patient's response and provide education to patient for prescribed medication. RN will report any adverse and/or side effects to prescribing provider.  Therapeutic Interventions: 1 on 1 counseling sessions, Psychoeducation, Medication administration, Evaluate responses to treatment, Monitor vital signs and CBGs as ordered, Perform/monitor CIWA, COWS, AIMS and Fall Risk screenings as ordered, Perform wound care treatments as ordered.  Evaluation of Outcomes: Not Met   LCSW Treatment Plan for Primary Diagnosis: <principal problem not specified> Long Term Goal(s): Safe transition to appropriate next level of care at discharge, Engage patient in therapeutic group addressing interpersonal concerns.  Short Term Goals: Engage patient in aftercare planning with referrals and resources, Increase social support, Increase emotional regulation, Facilitate acceptance of mental health diagnosis and concerns, Identify triggers associated with mental health/substance abuse issues and Increase skills for wellness and recovery  Therapeutic Interventions: Assess for all discharge needs, 1 to 1 time with Social worker, Explore available resources and support systems, Assess for adequacy in community support network, Educate family and significant other(s) on suicide prevention, Complete Psychosocial Assessment, Interpersonal group therapy.  Evaluation of Outcomes: Not Met   Progress in Treatment: Attending groups: No. Participating in groups: No. Taking medication as prescribed: Yes. Toleration medication: Yes. Family/Significant other contact made: Yes, individual(s) contacted:  Mother Patient understands diagnosis: Yes. and No. Discussing patient identified problems/goals with staff: Yes. Medical problems stabilized or resolved: Yes. Denies  suicidal/homicidal ideation: Yes. Issues/concerns per patient self-inventory: No.   New problem(s) identified: No, Describe:  None  New Short Term/Long Term Goal(s): medication stabilization, elimination of SI thoughts, development of comprehensive mental wellness plan.   Patient Goals:  "To get back on my medications"   Discharge Plan or Barriers: Patient recently admitted. CSW will continue to follow and assess for appropriate referrals and possible discharge planning.   Reason for Continuation of Hospitalization: Anxiety Depression Medication stabilization Suicidal ideation  Estimated Length of Stay: 3 to 5 days   Attendees: Patient: Whitney Beard 11/11/2020   Physician: Lala Lund, MD 11/11/2020   Nursing:  11/11/2020   RN Care Manager: 11/11/2020   Social Worker: Verdis Frederickson, [place] 11/11/2020   Recreational Therapist:  11/11/2020   Other:  11/11/2020  Other:  11/11/2020   Other: 11/11/2020     Scribe for Treatment Team: Darleen Crocker, LCSWA 11/11/2020 11:06 AM

## 2020-11-11 NOTE — [facility] Group Notes (Signed)
Occupational Therapy Group Note Date: 11/11/2020 Group Topic/Focus: Self-Esteem  Group Description: Group encouraged increased engagement and participation through discussion and activity focused on self-esteem. Patients explored and discussed the differences between healthy and low self-esteem and how it affects our daily lives and occupations with a focus on relationships, work, school, self-care, and personal leisure interests. Group discussion then transitioned into identifying specific strategies to boost self-esteem and engaged in a collaborative and independent activity looking at positive ways to describe oneself A-Z.   Therapeutic Goal(s): Understand and recognize the differences between healthy and low self-esteem Identify healthy strategies to improve/build self-esteem Participation Level: Active   Participation Quality: Independent   Behavior: Calm, Cooperative and Interactive   Speech/Thought Process: Focused   Affect/Mood: Full range   Insight: Fair   Judgement: Fair   Individualization: Whitney Beard was active and independent in her participation of group discussion and activity, sharing that she has low to no self-esteem and finds it easier to "help other people before myself." She identified her little sister as her best friend and shared it is easier to do things for her than herself. Appeared receptive to education and alternative strategies to boost self-esteem.   Modes of Intervention: Activity, Discussion, Education and Support  Patient Response to Interventions:  Attentive, Engaged, Receptive and Interested   Plan: Continue to engage patient in OT groups 2 - 3x/week.  11/11/2020  Donne Hazel, MOT, OTR/L

## 2020-11-11 NOTE — ED Notes (Signed)
Report called to Doris, RN at [facility]. 

## 2020-11-11 NOTE — ED Notes (Signed)
[facility] arrived to take patient to [facility].
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
## 2020-11-11 NOTE — H&P (Addendum)
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 by hx Cannabis use d/o - mild R/o alcohol use d/o  Breathing related sleep d/o (sleep apnea) by hx  PLAN: 1. Safety and Monitoring:  -- Patient agrees to Voluntary admission to inpatient psychiatric unit for safety, stabilization and treatment  -- Daily contact with patient to assess and evaluate symptoms and progress in treatment  -- Patient's case to be discussed in multi-disciplinary team meeting  -- Observation Level : q15 minute checks  -- Vital signs:  q12 hours  -- Precautions: suicide  2. Psychiatric Diagnoses and Treatment:   Bipolar I MRE depressed,  severe without psychotic features -- R/b/se/a to various mood stabilizers and antidepressants were discussed with the patient. After extensive discussion, she opts to restart Latuda 20mg  daily with plans to titrate up as tolerated for help with mood instability, impulsivity, and bipolar depressive symptoms. The specific risks/benefits/side-effects/alternatives to this medication including risk of developing DM, high cholesterol, weight gain, blood dyscrasias, EKG changes, and TD/EPS were discussed in detail with the patient and time was given for questions. The patient consents to medication trial. Will start med tomorrow after repeat EKG to confirm QTc prior to initiation of antipsychotic. -- Patient would like to restart Zoloft as her antidepressant. In the context of her recent overdose will need time to metabolize the Zoloft she just ingested before reinitiating med. Also discussed with patient the need to get adequate mood stabilizer on board before start of SSRI to avoid potential escalation to mania. The FDA Black Box Warning associated with start of Zoloft was also reviewed with the patient in detail. Specifically, the potential risk of developing suicidal thinking with use of an antidepressant in young adults up to age 57 was discussed and time was given for questions.    -- Metabolic profile and EKG monitoring needed while on an atypical antipsychotic (BMI: 55.68; Lipid Panel: ordered and pending  HbgA1c:ordered and pending QTc:481 with repeat EKG pending)   -- Encouraged patient to participate in unit milieu  and in scheduled group therapies   -- Will obtain patient's consent to talk with family for safety planning  -- Short Term Goals: Ability to verbalize feelings will improve, Ability to disclose and discuss suicidal ideas, Ability to demonstrate self-control will improve, Ability to identify and develop effective coping behaviors will improve and Compliance with prescribed medications will  improve  -- Long Term Goals: Improvement in symptoms so as ready for discharge   Unspecified anxiety d/o - r/o PTSD  -- PRN Vistaril 25mg  q6 hours PRN anxiety  -- Would benefit from outpatient psychotherapy and trauma focused therapy after discharge  -- Encouraged patient to participate in unit milieu and in scheduled group therapies   -- Short Term Goals: Ability to verbalize feelings will improve and Ability to identify and develop effective coping behaviors will improve  -- Long Term Goals: Improvement in symptoms so as ready for discharge   Borderline Personality d/o by hx  -- would benefit from DBT after discharge   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 Medical Issues Being Addressed:   Tobacco Use Disorder  -- Nicotine patch 21mg /24 hours ordered  -- Smoking cessation encouraged   Asthma  -- PRN Albuterol 2 puffs q 4 hours ordered  -- Dulera 2 puffs bid scheduled   Low TSH  (0.314 on 10/12/20)  -- Recheck TSH and Free T4 in morning   Borderline Prolonged QTc ( )  -- repeating EKG for monitoring prior to start of antipsychotic  4. Discharge Planning:   -- Social work and case management to assist with discharge planning and identification of hospital follow-up needs prior to discharge  -- Estimated LOS: 5-7 days  -- Discharge Concerns: Need to establish a safety plan; Medication compliance and effectiveness  -- Discharge Goals: Return home  with outpatient referrals for mental health follow-up including medication management/psychotherapy  I certify that inpatient services furnished can reasonably be expected to improve the patient's condition.    05-18-1985, MD, 10/14/20 12/13/20214:53 PM

## 2020-11-11 NOTE — [facility] Suicide Risk Assessment (Signed)
[facility] INPATIENT:  Family/Significant Other Suicide Prevention Education  Suicide Prevention Education:  Education Completed; Laverle Patter [phone] (Mother) has been identified by the patient as the family member/significant other with whom the patient will be residing, and identified as the person(s) who will aid the patient in the event of a mental health crisis (suicidal ideations/suicide attempt).  With written consent from the patient, the family member/significant other has been provided the following suicide prevention education, prior to the and/or following the discharge of the patient.  The suicide prevention education provided includes the following:  Suicide risk factors  Suicide prevention and interventions  National Suicide Hotline telephone number  [facility] assessment telephone number  [facility] Emergency Assistance 911  [facility] and/or Residential Mobile Crisis Unit telephone number  Request made of family/significant other to:  Remove weapons (e.g., guns, rifles, knives), all items previously/currently identified as safety concern.    Remove drugs/medications (over-the-counter, prescriptions, illicit drugs), all items previously/currently identified as a safety concern.  The family member/significant other verbalizes understanding of the suicide prevention education information provided.  The family member/significant other agrees to remove the items of safety concern listed above.  CSW spoke with Mrs. Mason Jim who states that [place] had an argument with her 2 sisters and that all of the daughters said "mean things to each other".  Mrs. Mason Jim states that she and Adah's grandmother has been attempting to help Mliss with her depression and motivate her.  Mrs. Mason Jim states that she has also been a buffer between all of her daughters and is working with them to stop the fighting and arguing.  Mrs. Mason Jim state that she would like  Nuha to have a shot instead of pills because Rafia will not take the pills and when she does takes to many of them due to thoughts of SI.  Mrs. Mason Jim confirms that she does have power of attorney and is the payee for Mardella.  Mrs. Mason Jim states that she would like Lylian out of the hospital by Friday evening because there is a family event on Saturday that Amour needs to be at.  Mrs. Mason Jim states that Loney will be staying at her house for a few days to help smooth things out between the siblings.  Mrs. Mason Jim states that Kimberlea also has a best friend and can stay at her house some too.  Mrs. Mason Jim states that there are no weapons or firearms in any of the homes. CSW completed SPE with Mrs. Mason Jim.   Metro Kung Kalany Diekmann 11/11/2020, 11:37 AM

## 2020-11-11 NOTE — Progress Notes (Signed)
Recreation Therapy Notes  Date:  12.13.21 Time: 0930 Location: [address] Group Room  Group Topic: Stress Management  Goal Area(s) Addresses:  Patient will identify positive stress management techniques. Patient will identify benefits of using stress management post d/c.  Intervention:  Stress Management  Activity: Meditation.  LRT played a meditation that focused on making the most of each moment of the day and the possibilities that could arise from it.    Education:  Stress Management, Discharge Planning.   Education Outcome: Acknowledges Education  Clinical Observations/Feedback: Pt did not attend group activity.    Heiley Shaikh, LRT/CTRS         Mickenzie Stolar A 11/11/2020 11:55 AM 

## 2020-11-11 NOTE — [facility] Group Notes (Signed)
[facility] LCSW Group Therapy  11/11/2020 3:19 PM  Type of Therapy:  Stress and Coping Skills  Participation Level:  Minimal  Participation Quality:  Appropriate  Affect:  Anxious and Depressed  Cognitive:  Appropriate  Insight:  Developing/Improving  Engagement in Therapy:  Developing/Improving  Modes of Intervention:  Activity and Discussion  Summary of Progress/Problems: Trea attended group and remained there the entire time.  Whitney Beard states that she is thankful for her little brother and also states that her family is a major stressor for her.  Whitney Beard states that she smokes Marijuana to help with this stress.  Whitney Beard states that her ability to be social helps her manage stress and her coping skills are walking for 20 minutes, reading for 60 minutes and spending time with her friends.  Whitney Beard participated by working on the 2 handouts that were provided during group.   Whitney Beard 11/11/2020, 3:19 PM

## 2020-11-11 NOTE — Progress Notes (Signed)
D:  Patient's self inventory sheet, patient has fair sleep, no sleep medication.  Poor appetite, low energy level, good concentration.  Rated depression 7, hopeless 8, anxiety 10.  Denied withdrawals.  SI yes/no.  Denied physical problems.  Denied physical pain.  Goal is be OK with being alive.  No discharge plans. A:  Medications administered per MD orders.  Emotional support and encouragement given patient. R:  Denied SI and HI, contracts for safety.  Denied A/V hallucinations.  Safety maintained with 15 minute checks.
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
## 2020-11-11 NOTE — Progress Notes (Signed)
Spiritual care group on grief and loss facilitated by chaplain Burnis Kingfisher  Group Goal:  Support / Education around grief and loss  Members engage in facilitated group support and psycho-social education.  Group Description:  Following introductions and group rules, group members engaged in facilitated group dialog and support around topic of loss, with particular support around experiences of loss in their lives. Group Identified types of loss (relationships / self / things) and identified patterns, circumstances, and changes that precipitate losses. Reflected on thoughts / feelings around loss, normalized grief responses, and recognized variety in grief experience.  Patient Progress: PT did not attend group.

## 2020-11-11 NOTE — Plan of Care (Signed)
Nurse discussed anxiety, depression and coping skills with patient.  

## 2020-11-12 DIAGNOSIS — F314 Bipolar disorder, current episode depressed, severe, without psychotic features: Principal | ICD-10-CM

## 2020-11-12 LAB — LIPID PANEL
Cholesterol: 170 mg/dL (ref 0–200)
HDL: 34 mg/dL — ABNORMAL LOW (ref >40)
LDL Cholesterol: 112 mg/dL — ABNORMAL HIGH (ref 0–99)
Total CHOL/HDL Ratio: 5 RATIO
Triglycerides: 118 mg/dL (ref <150)
VLDL: 24 mg/dL (ref 0–40)

## 2020-11-12 LAB — HEMOGLOBIN A1C
Hgb A1c MFr Bld: 5 % (ref 4.8–5.6)
Mean Plasma Glucose: 96.8 mg/dL

## 2020-11-12 LAB — TSH: TSH: 1.03 u[IU]/mL (ref 0.350–4.500)

## 2020-11-12 NOTE — Progress Notes (Signed)
Pt denies SI/HI/AVH.  Pt reported that "my mood is much better today."  RN observed pt smiling, and laughing and interacting with peers on the unit.  RN administered pt's flu shot and  pna shot without incident. PT took medications without difficulty and no adverse reactions were noted.  Pt remains safe with q 15 minute checks in place.  RN will continue to monitor and provide support as need.

## 2020-11-12 NOTE — Progress Notes (Signed)
[facility] MD Progress Note  11/12/2020 9:13 AM Whitney Beard  MRN:  [id number]   Subjective:   Patient reports her mood today is "a lot better". She reports sleeping well. Patient states her goal is to not let other people, like her family, control her emotions. She states she would really like to work with an outpatient therapist when she leaves. Patient asked if medications that are being started will "treat borderline personality disorder". Counseled patient that therapy and medication can work in conjunction to overall help patient. Discussed that therapy can assist with developing coping techniques and emotional regulation in response to conflicts with family. Patient expressed optimism for the future. Discussed that baseline EKG will be obtained today prior to starting Latuda. Patient denies SI, HI, AVH, feelings of self-harm at this time.  Principal Problem: Severe bipolar I disorder, current or most recent episode depressed (HCC) Diagnosis: Principal Problem:   Severe bipolar I disorder, current or most recent episode depressed (HCC) Active Problems:   Overdose, intentional self-harm, initial encounter (HCC)   Asthma   Anxiety disorder, unspecified   Cannabis use disorder, mild, abuse   Alcohol use   Borderline personality disorder (HCC)  Total Time spent with patient: 20 minutes  Past Psychiatric History: See H&P  Past Medical History:  Past Medical History:  Diagnosis Date  . ADHD (attention deficit hyperactivity disorder)   . Allergic rhinitis   . Asthma   . Bipolar 1 disorder (HCC)   . Diabetes mellitus without complication (HCC)    states today 11/11/20 "was told in the past that she was pre- diabetic"  . Obesity   . Sleep apnea   . Suicide attempt by drug ingestion [facility])     Past Surgical History:  Procedure Laterality Date  . ADENOIDECTOMY    . TONSILLECTOMY     Family History:  Family History  Problem Relation Age of Onset  . Bipolar disorder Father   .  Schizophrenia Father   . Liver disease Father   . Asthma Father   . Asthma Maternal Grandmother   . Schizophrenia Maternal Grandmother   . SIDS Maternal Aunt   . Heart disease Paternal Grandmother   . Diabetes Paternal Grandmother   . Seizures Paternal Grandmother    Family Psychiatric  History: See H&P Social History:  Social History   Substance and Sexual Activity  Alcohol Use Yes   Comment: 1-3 times a week      Social History   Substance and Sexual Activity  Drug Use Yes  . Types: Marijuana    Social History   Socioeconomic History  . Marital status: Single    Spouse name: Not on file  . Number of children: Not on file  . Years of education: Not on file  . Highest education level: High school graduate  Occupational History  . Not on file  Tobacco Use  . Smoking status: Current Every Day Smoker  . Smokeless tobacco: Never Used  . Tobacco comment: vapes   Vaping Use  . Vaping Use: Every day  . Substances: Nicotine  Substance and Sexual Activity  . Alcohol use: Yes    Comment: 1-3 times a week   . Drug use: Yes    Types: Marijuana  . Sexual activity: Never    Birth control/protection: None  Other Topics Concern  . Not on file  Social History Narrative   Lives with 19y/o sister. Is one of 8 siblings.   Social Determinants of Corporate investment banker  Strain: Not on file  Food Insecurity: Not on file  Transportation Needs: Not on file  Physical Activity: Not on file  Stress: Not on file  Social Connections: Not on file   Additional Social History: See H&P                        Sleep: Good  Appetite:  Fair  Current Medications: Current Facility-Administered Medications  Medication Dose Route Frequency Provider Last Rate Last Admin  . acetaminophen (TYLENOL) tablet 650 mg  650 mg Oral Q6H PRN Jaclyn Shaggy, PA-C      . albuterol (VENTOLIN HFA) 108 (90 Base) MCG/ACT inhaler 2 puff  2 puff Inhalation Q4H PRN Jaclyn Shaggy, PA-C   2  puff at 11/11/20 0929  . alum & mag hydroxide-simeth (MAALOX/MYLANTA) 200-200-20 MG/5ML suspension 30 mL  30 mL Oral Q4H PRN Melbourne Abts W, PA-C      . hydrOXYzine (ATARAX/VISTARIL) tablet 25 mg  25 mg Oral Q6H PRN Comer Locket, MD   25 mg at 11/11/20 2204  . influenza vac split quadrivalent PF (FLUARIX) injection 0.5 mL  0.5 mL Intramuscular Tomorrow-1000 Jola Babinski, Marlane Mingle, MD      . loperamide (IMODIUM) capsule 2-4 mg  2-4 mg Oral PRN Comer Locket, MD      . LORazepam (ATIVAN) tablet 1 mg  1 mg Oral Q6H PRN Mason Jim, Amy E, MD      . lurasidone (LATUDA) tablet 20 mg  20 mg Oral Q supper Singleton, Amy E, MD      . magnesium hydroxide (MILK OF MAGNESIA) suspension 30 mL  30 mL Oral Daily PRN Jaclyn Shaggy, PA-C      . mometasone-formoterol [facility]) 200-5 MCG/ACT inhaler 2 puff  2 puff Inhalation BID Jaclyn Shaggy, PA-C   2 puff at 11/12/20 0805  . multivitamin with minerals tablet 1 tablet  1 tablet Oral Daily Comer Locket, MD   1 tablet at 11/12/20 0805  . nicotine (NICODERM CQ - dosed in mg/24 hours) patch 21 mg  21 mg Transdermal Daily PRN Mason Jim, Amy E, MD      . ondansetron (ZOFRAN-ODT) disintegrating tablet 4 mg  4 mg Oral Q6H PRN Mason Jim, Amy E, MD      . pneumococcal 23 valent vaccine (PNEUMOVAX-23) injection 0.5 mL  0.5 mL Intramuscular Tomorrow-1000 Antonieta Pert, MD      . thiamine tablet 100 mg  100 mg Oral Daily Mason Jim, Amy E, MD   100 mg at 11/12/20 0805  . traZODone (DESYREL) tablet 50 mg  50 mg Oral QHS PRN Jaclyn Shaggy, PA-C   50 mg at 11/11/20 2204    Lab Results:  Results for orders placed or performed during the hospital encounter of 11/11/20 (from the past 48 hour(s))  T4, free     Status: None   Collection Time: 11/11/20  6:22 PM  Result Value Ref Range   Free T4 1.00 0.61 - 1.12 ng/dL    Comment: (NOTE) Biotin ingestion may interfere with free T4 tests. If the results are inconsistent with the TSH level, previous test results, or  the clinical presentation, then consider biotin interference. If needed, order repeat testing after stopping biotin. Performed at [facility], [address] [place], [place] [address]   Hemoglobin A1c     Status: None   Collection Time: 11/12/20  6:19 AM  Result Value Ref Range   Hgb A1c MFr  Bld 5.0 4.8 - 5.6 %    Comment: (NOTE) Pre diabetes:          5.7%-6.4%  Diabetes:              >6.4%  Glycemic control for   <7.0% adults with diabetes    Mean Plasma Glucose 96.8 mg/dL    Comment: Performed at [facility], [address] [place], [place] [address]  TSH     Status: None   Collection Time: 11/12/20  6:19 AM  Result Value Ref Range   TSH 1.030 0.350 - 4.500 uIU/mL    Comment: Performed by a 3rd Generation assay with a functional sensitivity of <=0.01 uIU/mL. Performed at [facility], [address] [place], [place] [address]   Lipid panel     Status: Abnormal   Collection Time: 11/12/20  6:19 AM  Result Value Ref Range   Cholesterol 170 0 - 200 mg/dL   Triglycerides 098 <119 mg/dL   HDL 34 (L) >14 mg/dL   Total CHOL/HDL Ratio 5.0 RATIO   VLDL 24 0 - 40 mg/dL   LDL Cholesterol 782 (H) 0 - 99 mg/dL    Comment:        Total Cholesterol/HDL:CHD Risk Coronary Heart Disease Risk Table                     Men   Women  1/2 Average Risk   3.4   3.3  Average Risk       5.0   4.4  2 X Average Risk   9.6   7.1  3 X Average Risk  23.4   11.0        Use the calculated Patient Ratio above and the CHD Risk Table to determine the patient's CHD Risk.        ATP III CLASSIFICATION (LDL):  <100     mg/dL   Optimal  956-213  mg/dL   Near or Above                    Optimal  130-159  mg/dL   Borderline  086-578  mg/dL   High  >469     mg/dL   Very High Performed at [facility], [address] [place], [place] [address]     Blood Alcohol level:  Lab Results  Component Value Date   ETH <10 11/10/2020   ETH <10  06/27/2020    Metabolic Disorder Labs: Lab Results  Component Value Date   HGBA1C 5.0 11/12/2020   MPG 96.8 11/12/2020   MPG 96.8 10/11/2020   No results found for: PROLACTIN Lab Results  Component Value Date   CHOL 170 11/12/2020   TRIG 118 11/12/2020   HDL 34 (L) 11/12/2020   CHOLHDL 5.0 11/12/2020   VLDL 24 11/12/2020   LDLCALC 112 (H) 11/12/2020   LDLCALC 108 (H) 06/29/2020    Physical Findings: AIMS:  , ,  ,  ,    CIWA:  CIWA-Ar Total: 0 COWS:     Musculoskeletal: Strength & Muscle Tone: within normal limits Gait & Station: normal Patient leans: N/A  Psychiatric Specialty Exam: Physical Exam Constitutional:      Appearance: She is obese.  HENT:     Head: Normocephalic and atraumatic.  Eyes:     Extraocular Movements: Extraocular movements intact.     Conjunctiva/sclera: Conjunctivae normal.  Pulmonary:     Effort: No respiratory distress.  Neurological:     General: No focal deficit present.     Mental Status: She is alert.     Review of Systems  All other systems reviewed and are negative.   Blood pressure 109/68, pulse 66, temperature 98 F (36.7 C), temperature source Oral, resp. rate 18, height 5\' 6"  (1.676 m), weight (!) 156.5 kg, SpO2 100 %.Body mass index is 55.68 kg/m.  General Appearance: Casual - patient continues to fidget and twist hands together while speaking. Continues to apologize often throughout conversation.  Eye Contact:  Good  Speech:  Normal Rate  Volume:  Normal  Mood:  "a lot better"  Affect:  Congruent - patient appears euthymic today  Thought Process:  Coherent and Linear  Orientation:  NA  Thought Content:  Logical  Suicidal Thoughts:  No  Homicidal Thoughts:  No  Memory:  NA  Judgement:  Fair  Insight:  Fair  Psychomotor Activity:  Negative  Concentration:  Concentration: Fair and Attention Span: Fair  Recall:  NA  Fund of Knowledge:  NA  Language:  Good  Akathisia:  NA  Handed:  Right  AIMS (if indicated):      Assets:  Communication Skills Desire for Improvement Financial Resources/Insurance Housing  ADL's:  Intact  Cognition:  WNL  Sleep:  Number of Hours: 6.5     Treatment Plan Summary: Daily contact with patient to assess and evaluate symptoms and progress in treatment and Medication management   Whitney Beard is a 19 year-old female with previous diagnoses of bipolar 1 disorder, Borderline personality d/o, MDD, GAD, and ADD who presented following suicide attempt by intentional drug ingestion of sertraline/hydroxyzine in the context of recent fight with sister, worsening depression, and stopping her medication two months ago. Obtained baseline EKG and will restart Latuda and then Zoloft.  Diagnoses / Active Problems:  Bipolar I MRE depressed, severe without psychotic features  Unspecified anxiety d/o - r/o PTSD  Borderline personality d/o by hx  Cannabis use d/o - mild  R/o alcohol use d/o   Breathing related sleep d/o (sleep apnea) by hx  PLAN: 1. Safety and Monitoring:             -- Patient agrees to Voluntary admission to inpatient psychiatric unit for safety, stabilization and treatment             -- Daily contact with patient to assess and evaluate symptoms and progress in treatment             -- Patient's case to be discussed in multi-disciplinary team meeting             -- Observation Level : q15 minute checks             -- Vital signs:  q12 hours             -- Precautions: suicide  2. Psychiatric Diagnoses and Treatment:              Bipolar I MRE depressed, severe without psychotic features -- Patient opts to restart Latuda 20mg  daily as dicussed yesterday. Baseline EKG obtained. -- Patient would like to restart Zoloft as her antidepressant. In the context of her recent overdose will need time to metabolize the Zoloft she just ingested before reinitiating med. Also discussed with patient the need to get adequate mood stabilizer on board before start of SSRI to avoid  potential escalation to mania. Will restart Zoloft tomorrow.             --  Encouraged patient to participate in unit milieu and in scheduled group therapies              -- Will obtain patient's consent to talk with family for safety planning             -- Short Term Goals: Ability to verbalize feelings will improve, Ability to disclose and discuss suicidal ideas, Ability to demonstrate self-control will improve, Ability to identify and develop effective coping behaviors will improve and Compliance with prescribed medications will improve             -- Long Term Goals: Improvement in symptoms so as ready for discharge              Unspecified anxiety d/o - r/o PTSD             -- PRN Vistaril 25mg  q6 hours PRN anxiety             -- Would benefit from outpatient psychotherapy and trauma focused therapy after discharge             -- Encouraged patient to participate in unit milieu and in scheduled group therapies              -- Short Term Goals: Ability to verbalize feelings will improve and Ability to identify and develop effective coping behaviors will improve             -- Long Term Goals: Improvement in symptoms so as ready for discharge              Borderline Personality d/o by hx             -- would benefit from DBT after discharge              Cannabis use d/o - mild (UDS positive for THC)             R/o alcohol use d/o              -- Placed on CIWA protocol for alcohol withdrawal monitoring and placed on MVI and Thiamine             -- Counseled on need for abstinence from alcohol and illicit drug use for mental and physical health benefits; she declines referral for residential rehab/SAIOP - would benefit from substance abuse counseling after discharge              Breathing related sleep d/o (sleep apnea) by hx             -- Has Trazodone 50mg  qhs PRN insomnia             -- would benefit from sleep study as an outpatient to clarify diagnosis and need for CPAP               3. Medical Issues Being Addressed:              Tobacco Use Disorder             -- Nicotine patch 21mg /24 hours ordered             -- Smoking cessation encouraged              Asthma             -- PRN Albuterol 2 puffs q 4 hours ordered             -- Dulera 2 puffs bid scheduled  Low TSH  (0.314 on 10/12/20)             -- Recheck TSH and Free T4 today              Borderline Prolonged QTc ( )             -- repeating EKG for monitoring prior to start of antipsychotic  4. Discharge Planning:              -- Social work and case management to assist with discharge planning and identification of hospital follow-up needs prior to discharge             -- Estimated LOS: 5-7 days             -- Discharge Concerns: Need to establish a safety plan; Medication compliance and effectiveness             -- Discharge Goals: Return home with outpatient referrals for mental health follow-up including medication management/psychotherapy  Silvio Pate, Medical Student 11/12/2020, 9:13 AM

## 2020-11-12 NOTE — Progress Notes (Signed)
D: Patient presents with pleasant affect and reports having a good day. Patient states she spoke with her best friend and had a good conversation. Patient denies SI/HI at this time. Patient also denies AH/VH at this time. Patient contracts for safety.  A: Provided positive reinforcement and encouragement. Discussed coping mechanisms for anxiety. R: Patient cooperative and receptive to efforts. Patient remains safe on the unit.   11/12/20 2103  Psych Admission Type (Psych Patients Only)  Admission Status Voluntary  Psychosocial Assessment  Patient Complaints None  Eye Contact Fair  Facial Expression Animated  Affect Appropriate to circumstance  Speech Logical/coherent  Interaction Assertive  Motor Activity Slow  Appearance/Hygiene Disheveled  Behavior Characteristics Cooperative;Appropriate to situation  Mood Pleasant  Thought Process  Coherency WDL  Content WDL  Delusions None reported or observed  Perception WDL  Hallucination None reported or observed  Judgment WDL  Confusion None  Danger to Self  Current suicidal ideation? Denies  Self-Injurious Behavior No self-injurious ideation or behavior indicators observed or expressed   Agreement Not to Harm Self Yes  Description of Agreement Verbal Contract  Danger to Others  Danger to Others None reported or observed

## 2020-11-13 MED ORDER — WHITE PETROLATUM EX OINT
TOPICAL_OINTMENT | CUTANEOUS | Status: AC
  Filled 2020-11-13: qty 5

## 2020-11-13 MED ORDER — LURASIDONE HCL 40 MG PO TABS
40.0000 mg | ORAL_TABLET | Freq: Every day | ORAL | Status: DC
  Administered 2020-11-13 – 2020-11-14 (×2): 40 mg via ORAL
  Filled 2020-11-13 (×3): qty 1

## 2020-11-13 MED ORDER — SERTRALINE HCL 25 MG PO TABS
25.0000 mg | ORAL_TABLET | Freq: Every day | ORAL | Status: DC
  Administered 2020-11-13 – 2020-11-15 (×3): 25 mg via ORAL
  Filled 2020-11-13 (×5): qty 1

## 2020-11-13 MED ORDER — LORATADINE 10 MG PO TABS: 10.0000 mg | ORAL_TABLET | Freq: Every day | ORAL | Status: DC | PRN

## 2020-11-13 NOTE — Progress Notes (Signed)
Adult Psychoeducational Group Note  Date:  11/13/2020 Time:  11:12 AM  Group Topic/Focus:  Self Esteem Action Plan:   The focus of this group is to help patients create a plan to continue to build self-esteem after discharge.  Participation Level:  Active  Participation Quality:  Appropriate  Affect:  Appropriate  Cognitive:  Alert  Insight: Appropriate  Engagement in Group:  Engaged  Modes of Intervention:  Discussion and Education  Additional Comments:  Pt attended group and participated positively.  Hawkin Charo E 11/13/2020, 11:12 AM

## 2020-11-13 NOTE — Progress Notes (Signed)
D:  Whitney Beard has been up and visible on the unit.  Attending groups with a positive attitude.  She denied any SI/HI or A/V hallucinations.  She denied any pain or discomfort.  She was happy that she slept well last night with the trazodone and didn't have nightmares or flash backs "that's new for me."  She completed her self inventory and reported that her depression is 3/10, hopelessness 0/10 and anxiety 7/10 (10 the worst).   A:  1:1 with RN for support and encouragement.  Medications as ordered.  Q 15 minute checks maintained for safety.  Encouraged participation in group and unit activities.   R:  Whitney Beard remains safe on the unit.  We will continue to monitor the progress towards her goals.

## 2020-11-13 NOTE — Progress Notes (Signed)
Recreation Therapy Notes  Date:  12.15.21 Time: 0930 Location: [address] Dayroom  Group Topic: Stress Management  Goal Area(s) Addresses:  Patient will identify positive stress management techniques. Patient will identify benefits of using stress management post d/c.  Behavioral Response:  Engaged  Intervention: Stress Management  Activity: Meditation.  LRT played a meditation that focused on being able to self sooth when dealing with things such as anger, anxiety, depression, etc.  The meditation focused on taking the time for yourself you need to calm your emotions.    Education:  Stress Management, Discharge Planning.   Education Outcome: Acknowledges Education  Clinical Observations/Feedback: Pt attended and participated in group session.    Khadijatou Borak, LRT/CTRS         Sibel Khurana A 11/13/2020 10:02 AM 

## 2020-11-13 NOTE — Progress Notes (Signed)
[facility]  11/13/2020 8:13 AM Whitney Beard  MRN:  [id number]   Subjective:   Patient reports feeling "good" and sleeping well. She is anxious about leaving the hospital due to her family being the source of her feelings. She states a goal of hers today is to write a letter to her family about how they make her feel, but she does not plan to share it with them as they often "invalidate my feelings". She is exciting about seeing the therapy dogs at some point but states she is allergic.  Patient denies sleep changes, appetite changes, abdominal pain, headache, SI, HI, and AVH.   Principal Problem: Severe bipolar I disorder, current or most recent episode depressed (HCC) Diagnosis: Principal Problem:   Severe bipolar I disorder, current or most recent episode depressed (HCC) Active Problems:   Overdose, intentional self-harm, initial encounter (HCC)   Asthma   Anxiety disorder, unspecified   Cannabis use disorder, mild, abuse   Alcohol use   Borderline personality disorder (HCC)  Total Time spent with patient: 15 minutes  Past Psychiatric History: See H&P  Past Medical History:  Past Medical History:  Diagnosis Date   ADHD (attention deficit hyperactivity disorder)    Allergic rhinitis    Asthma    Bipolar 1 disorder (HCC)    Diabetes mellitus without complication (HCC)    states today 11/11/20 "was told in the past that she was pre- diabetic"   Obesity    Sleep apnea    Suicide attempt by drug ingestion [facility](HCC)     Past Surgical History:  Procedure Laterality Date   ADENOIDECTOMY     TONSILLECTOMY     Family History:  Family History  Problem Relation Age of Onset   Bipolar disorder Father    Schizophrenia Father    Liver disease Father    Asthma Father    Asthma Maternal Grandmother    Schizophrenia Maternal Grandmother    SIDS Maternal Aunt    Heart disease Paternal Grandmother    Diabetes Paternal Grandmother    Seizures Paternal  Grandmother    Family Psychiatric History: See H&P Social History:  Social History   Substance and Sexual Activity  Alcohol Use Yes   Comment: 1-3 times a week      Social History   Substance and Sexual Activity  Drug Use Yes   Types: Marijuana    Social History   Socioeconomic History   Marital status: Single    Spouse name: Not on file   Number of children: Not on file   Years of education: Not on file   Highest education level: High school graduate  Occupational History   Not on file  Tobacco Use   Smoking status: Current Every Day Smoker   Smokeless tobacco: Never Used   Tobacco comment: vapes   Vaping Use   Vaping Use: Every day   Substances: Nicotine  Substance and Sexual Activity   Alcohol use: Yes    Comment: 1-3 times a week    Drug use: Yes    Types: Marijuana   Sexual activity: Never    Birth control/protection: None  Other Topics Concern   Not on file  Social History Narrative   Lives with 19y/o sister. Is one of 8 siblings.   Social Determinants of Health   Financial Resource Strain: Not on file  Food Insecurity: Not on file  Transportation Needs: Not on file  Physical Activity: Not on file  Stress: Not on file  Social Connections: Not on file   Additional Social History: See H&P  Sleep: Good  Appetite:  Fair  Current Medications: Current Facility-Administered Medications  Medication Dose Route Frequency Provider Last Rate Last Admin   white petrolatum (VASELINE) gel            acetaminophen (TYLENOL) tablet 650 mg  650 mg Oral Q6H PRN Jaclyn Shaggy, PA-C       albuterol (VENTOLIN HFA) 108 (90 Base) MCG/ACT inhaler 2 puff  2 puff Inhalation Q4H PRN Jaclyn Shaggy, PA-C   2 puff at 11/11/20 0929   alum & mag hydroxide-simeth (MAALOX/MYLANTA) 200-200-20 MG/5ML suspension 30 mL  30 mL Oral Q4H PRN Jaclyn Shaggy, PA-C       hydrOXYzine (ATARAX/VISTARIL) tablet 25 mg  25 mg Oral Q6H PRN Comer Locket, MD   25 mg at  11/12/20 2103   loperamide (IMODIUM) capsule 2-4 mg  2-4 mg Oral PRN Comer Locket, MD       LORazepam (ATIVAN) tablet 1 mg  1 mg Oral Q6H PRN Comer Locket, MD       lurasidone (LATUDA) tablet 20 mg  20 mg Oral Q supper Singleton, Amy E, MD   20 mg at 11/12/20 1709   magnesium hydroxide (MILK OF MAGNESIA) suspension 30 mL  30 mL Oral Daily PRN Jaclyn Shaggy, PA-C       mometasone-formoterol (DULERA) 200-5 MCG/ACT inhaler 2 puff  2 puff Inhalation BID Melbourne Abts W, PA-C   2 puff at 11/12/20 1710   multivitamin with minerals tablet 1 tablet  1 tablet Oral Daily Comer Locket, MD   1 tablet at 11/12/20 0805   nicotine (NICODERM CQ - dosed in mg/24 hours) patch 21 mg  21 mg Transdermal Daily PRN Bartholomew Crews E, MD   21 mg at 11/12/20 1006   ondansetron (ZOFRAN-ODT) disintegrating tablet 4 mg  4 mg Oral Q6H PRN Comer Locket, MD       thiamine tablet 100 mg  100 mg Oral Daily Singleton, Amy E, MD   100 mg at 11/12/20 0805   traZODone (DESYREL) tablet 50 mg  50 mg Oral QHS PRN Jaclyn Shaggy, PA-C   50 mg at 11/12/20 2103    Lab Results:  Results for orders placed or performed during the hospital encounter of 11/11/20 (from the past 48 hour(s))  T4, free     Status: None   Collection Time: 11/11/20  6:22 PM  Result Value Ref Range   Free T4 1.00 0.61 - 1.12 ng/dL    Comment: (NOTE) Biotin ingestion may interfere with free T4 tests. If the results are inconsistent with the TSH level, previous test results, or the clinical presentation, then consider biotin interference. If needed, order repeat testing after stopping biotin. Performed at [facility], [address] [place], [place] [address]   Hemoglobin A1c     Status: None   Collection Time: 11/12/20  6:19 AM  Result Value Ref Range   Hgb A1c MFr Bld 5.0 4.8 - 5.6 %    Comment: (NOTE) Pre diabetes:          5.7%-6.4%  Diabetes:              >6.4%  Glycemic control for   <7.0% adults with diabetes     Mean Plasma Glucose 96.8 mg/dL    Comment: Performed at [facility], [address], [place], [place] [address]  TSH  Status: None   Collection Time: 11/12/20  6:19 AM  Result Value Ref Range   TSH 1.030 0.350 - 4.500 uIU/mL    Comment: Performed by a 3rd Generation assay with a functional sensitivity of <=0.01 uIU/mL. Performed at [facility], [address], [place], [place] [address]   Lipid panel     Status: Abnormal   Collection Time: 11/12/20  6:19 AM  Result Value Ref Range   Cholesterol 170 0 - 200 mg/dL   Triglycerides 532 <992 mg/dL   HDL 34 (L) >42 mg/dL   Total CHOL/HDL Ratio 5.0 RATIO   VLDL 24 0 - 40 mg/dL   LDL Cholesterol 683 (H) 0 - 99 mg/dL    Comment:        Total Cholesterol/HDL:CHD Risk Coronary Heart Disease Risk Table                     Men   Women  1/2 Average Risk   3.4   3.3  Average Risk       5.0   4.4  2 X Average Risk   9.6   7.1  3 X Average Risk  23.4   11.0        Use the calculated Patient Ratio above and the CHD Risk Table to determine the patient's CHD Risk.        ATP III CLASSIFICATION (LDL):  <100     mg/dL   Optimal  419-622  mg/dL   Near or Above                    Optimal  130-159  mg/dL   Borderline  297-989  mg/dL   High  >211     mg/dL   Very High Performed at [facility], [address] [place], [place] [address]     Blood Alcohol level:  Lab Results  Component Value Date   ETH <10 11/10/2020   ETH <10 06/27/2020    Metabolic Disorder Labs: Lab Results  Component Value Date   HGBA1C 5.0 11/12/2020   MPG 96.8 11/12/2020   MPG 96.8 10/11/2020   No results found for: PROLACTIN Lab Results  Component Value Date   CHOL 170 11/12/2020   TRIG 118 11/12/2020   HDL 34 (L) 11/12/2020   CHOLHDL 5.0 11/12/2020   VLDL 24 11/12/2020   LDLCALC 112 (H) 11/12/2020   LDLCALC 108 (H) 06/29/2020    Physical Findings: AIMS:  , ,  ,  ,    CIWA:  CIWA-Ar Total: 0 COWS:      Musculoskeletal: Strength & Muscle Tone: within normal limits Gait & Station: did not assess, patient was sitting in bed Patient leans: N/A  Psychiatric Specialty Exam: Physical Exam Constitutional:      Appearance: She is obese.  HENT:     Head: Normocephalic.  Eyes:     Extraocular Movements: Extraocular movements intact.     Conjunctiva/sclera: Conjunctivae normal.  Pulmonary:     Effort: No respiratory distress.  Neurological:     General: No focal deficit present.     Mental Status: She is alert.     Review of Systems  Cardiovascular: Negative for chest pain.  Gastrointestinal: Negative for abdominal pain.  Neurological: Negative for headaches.  Psychiatric/Behavioral: Negative for sleep disturbance. The patient is nervous/anxious.     Blood pressure (!) 149/119, pulse 96, temperature 97.9 F (36.6 C), temperature source Oral, resp. rate 16, height  5\' 6"  (1.676 m), weight (!) 156.5 kg, SpO2 100 %.Body mass index is 55.68 kg/m.  General Appearance: Casual  Eye Contact:  Good  Speech:  Clear and Coherent  Volume:  Normal  Mood:  Anxious  Affect:  Congruent  Thought Process:  Coherent  Orientation:  NA  Thought Content:  Logical  Suicidal Thoughts:  No  Homicidal Thoughts:  No  Memory:  NA  Judgement:  Fair  Insight:  Fair  Psychomotor Activity:  Normal  Concentration:  Concentration: Good and Attention Span: Good  Recall:  Good  Fund of Knowledge:  NA  Language:  Good  Akathisia:  No  Handed:  Right  AIMS (if indicated):     Assets:  Communication Skills Desire for Improvement Financial Resources/Insurance  ADL's:  Intact  Cognition:  WNL  Sleep:  Number of Hours: 7    Treatment Plan Summary: Daily contact with patient to assess and evaluate symptoms and progress in treatment and Medication management   Whitney Beard is a 19 year-old female with previous diagnoses of bipolar 1 disorder,Borderline personality d/o,MDD, GAD, and ADD who presented  following suicide attempt by intentional drug ingestion of sertraline/hydroxyzine in the context of recent fight with sister, worsening depression, and stopping her medication two months ago. Obtained baseline EKG and have restarted Latuda. Will plan to restart Zoloft.  PLAN: - Increase Latuda 20mg  to 40mg  - Start Zoloft 25mg  today - Claritin PRN for allergies - Vistaril 25mg  q6hours PRN anxiety - Trazodone 50mg  qhs PRN insomnia - Encourage involvement in group therapy sessions while here. - Establish follow-up, OP care. Patient would benefit from DBT. - Placed on CIWA protocol, no s/s of withdrawal at this time.  25, Medical Student 11/13/2020, 8:13 AM

## 2020-11-13 NOTE — Progress Notes (Signed)
Adult Psychoeducational Group Note  Date:  11/13/2020 Time:  12:28 AM  Group Topic/Focus: Adult Psychoeducational Group Note  Date:  11/13/2020 Time:  12:32 AM  Group Topic/Focus:  Wrap-Up Group:   The focus of this group is to help patients review their daily goal of treatment and discuss progress on daily workbooks.  Participation Level:  Active  Participation Quality:  Appropriate  Affect:  Appropriate  Cognitive:  Appropriate  Insight: Appropriate  Engagement in Group:  Engaged  Modes of Intervention:  Discussion  Additional Comments:  Pt attend wrap up group.  Charna Busman Long 11/13/2020, 12:32 AMWrap-Up Group:     Participation Quality:    Affect:    Cognitive:   Insight:   Engagement in Group:    Modes of Intervention:    Additional Comments:  Pt attend wrap up group. Her day was a 7. The one positive thing that happen to her she talked to her best friend.  Charna Busman Long 11/13/2020, 12:28 AM

## 2020-11-14 NOTE — Progress Notes (Signed)
[facility] Group Notes:  (Nursing/MHT/Case Management/Adjunct)  Date:  11/14/2020  Time:  2100  Type of Therapy:  wrap up group  Participation Level:  Active  Participation Quality:  Appropriate, Attentive, Sharing and Supportive  Affect:  Excited  Cognitive:  Alert  Insight:  Improving  Engagement in Group:  Engaged  Modes of Intervention:  Clarification, Education and Support  Summary of Progress/Problems: Positive thinking and positive change were discussed,  Marcille Buffy 11/14/2020, 11:40 PM

## 2020-11-14 NOTE — Progress Notes (Signed)
Adult Psychoeducational Group Note  Date:  11/14/2020 Time:  5:58 PM  Group Topic/Focus:  Goals Group:   The focus of this group is to help patients establish daily goals to achieve during treatment and discuss how the patient can incorporate goal setting into their daily lives to aide in recovery.  Participation Level:  Active  Participation Quality:  Appropriate  Affect:  Appropriate  Cognitive:  Alert and Appropriate  Insight: Appropriate, Good and Improving  Engagement in Group:  Engaged  Modes of Intervention:  Discussion  Additional Comments:  Pt attended group and participated in discussion.   Ojas Coone R Shaelynn Dragos 11/14/2020, 5:58 PM

## 2020-11-14 NOTE — Progress Notes (Signed)
   11/14/20 0221  Psych Admission Type (Psych Patients Only)  Admission Status Voluntary  Psychosocial Assessment  Patient Complaints None  Eye Contact Fair  Facial Expression Animated  Affect Appropriate to circumstance  Speech Logical/coherent  Interaction Assertive  Appearance/Hygiene Unremarkable  Behavior Characteristics Cooperative;Appropriate to situation  Mood Anxious  Thought Process  Coherency WDL  Content WDL  Delusions WDL  Perception WDL  Hallucination None reported or observed  Judgment WDL  Confusion WDL  Danger to Self  Current suicidal ideation? Denies  Danger to Others  Danger to Others None reported or observed

## 2020-11-14 NOTE — Plan of Care (Signed)
Nurse discussed anxiety, depression and coping skills with patient.  

## 2020-11-14 NOTE — Plan of Care (Addendum)
D:  Patient's self inventory sheet, patient slept good, sleep medication helpful.   Good appetite, normal energy level, good concentration.  Denied depression, anxiety, hopeless.  Denied withdrawals.  Denied SI.  Denied physical problems.  Denied physical pain.  Goal is discharge plans.  Goal is work hard.  No discharge plans. A:  Medications administered per MD orders.  Emotional support and encouragement given patient. R:  Denied SI and HI, contracts for safety.  Denied A/V hallucinations.

## 2020-11-14 NOTE — Progress Notes (Signed)
[facility] MD Progress Note  11/14/2020 7:57 AM Whitney Beard  MRN:  [id number]   Subjective:   Patient reports feeling "really good" and sleeping well. She states the Trazodone has been very helpful for sleeping. She states her appetite has improved since she has arrive. The patient states her mood change has largely been due to being removed from the source of her stress, her family, while here. The patient acknowledges that she will have to return to her family and "can't hide" here forever, but that she has to "develop coping skills" and "build up the inner strength". Patient states she read a book yesterday for the first time in a while which is an activity she enjoys. Patient states her anxiety is "not good" despite her mood because she knows her stressors have not disappeared but she states more optimism in handling it. She is looking forward to spending time with her best friend on Sunday after leaving.  Patient denies abdominal pain, headache, SI, HI, and AVH. She states she had a mild headache yesterday after taking her Zoloft but it resolved with tylenol. She reports this has happened in the past when starting Zoloft and only lasted a few days.  Principal Problem: Severe bipolar I disorder, current or most recent episode depressed (HCC) Diagnosis: Principal Problem:   Severe bipolar I disorder, current or most recent episode depressed (HCC) Active Problems:   Overdose, intentional self-harm, initial encounter (HCC)   Asthma   Anxiety disorder, unspecified   Cannabis use disorder, mild, abuse   Alcohol use   Borderline personality disorder (HCC)  Total Time spent with patient: 15 minutes  Past Psychiatric History: See H&P  Past Medical History:  Past Medical History:  Diagnosis Date  . ADHD (attention deficit hyperactivity disorder)   . Allergic rhinitis   . Asthma   . Bipolar 1 disorder (HCC)   . Diabetes mellitus without complication (HCC)    states today 11/11/20 "was told in the  past that she was pre- diabetic"  . Obesity   . Sleep apnea   . Suicide attempt by drug ingestion [facility])     Past Surgical History:  Procedure Laterality Date  . ADENOIDECTOMY    . TONSILLECTOMY     Family History:  Family History  Problem Relation Age of Onset  . Bipolar disorder Father   . Schizophrenia Father   . Liver disease Father   . Asthma Father   . Asthma Maternal Grandmother   . Schizophrenia Maternal Grandmother   . SIDS Maternal Aunt   . Heart disease Paternal Grandmother   . Diabetes Paternal Grandmother   . Seizures Paternal Grandmother    Family Psychiatric History: See H&P Social History:  Social History   Substance and Sexual Activity  Alcohol Use Yes   Comment: 1-3 times a week      Social History   Substance and Sexual Activity  Drug Use Yes  . Types: Marijuana    Social History   Socioeconomic History  . Marital status: Single    Spouse name: Not on file  . Number of children: Not on file  . Years of education: Not on file  . Highest education level: High school graduate  Occupational History  . Not on file  Tobacco Use  . Smoking status: Current Every Day Smoker  . Smokeless tobacco: Never Used  . Tobacco comment: vapes   Vaping Use  . Vaping Use: Every day  . Substances: Nicotine  Substance and Sexual Activity  .  Alcohol use: Yes    Comment: 1-3 times a week   . Drug use: Yes    Types: Marijuana  . Sexual activity: Never    Birth control/protection: None  Other Topics Concern  . Not on file  Social History Narrative   Lives with 19y/o sister. Is one of 8 siblings.   Social Determinants of Health   Financial Resource Strain: Not on file  Food Insecurity: Not on file  Transportation Needs: Not on file  Physical Activity: Not on file  Stress: Not on file  Social Connections: Not on file   Additional Social History: See H&P  Sleep: Good  Appetite:  Good  Current Medications: Current Facility-Administered Medications   Medication Dose Route Frequency Provider Last Rate Last Admin  . acetaminophen (TYLENOL) tablet 650 mg  650 mg Oral Q6H PRN Jaclyn Shaggy, PA-C   650 mg at 11/13/20 1638  . albuterol (VENTOLIN HFA) 108 (90 Base) MCG/ACT inhaler 2 puff  2 puff Inhalation Q4H PRN Jaclyn Shaggy, PA-C   2 puff at 11/14/20 0746  . alum & mag hydroxide-simeth (MAALOX/MYLANTA) 200-200-20 MG/5ML suspension 30 mL  30 mL Oral Q4H PRN Melbourne Abts W, PA-C      . hydrOXYzine (ATARAX/VISTARIL) tablet 25 mg  25 mg Oral Q6H PRN Comer Locket, MD   25 mg at 11/12/20 2103  . loperamide (IMODIUM) capsule 2-4 mg  2-4 mg Oral PRN Mason Jim, Amy E, MD      . loratadine (CLARITIN) tablet 10 mg  10 mg Oral Daily PRN Filip Luten, Worthy Rancher, MD      . LORazepam (ATIVAN) tablet 1 mg  1 mg Oral Q6H PRN Mason Jim, Amy E, MD      . lurasidone (LATUDA) tablet 40 mg  40 mg Oral Q supper Yesika Rispoli, Worthy Rancher, MD   40 mg at 11/13/20 1638  . magnesium hydroxide (MILK OF MAGNESIA) suspension 30 mL  30 mL Oral Daily PRN Jaclyn Shaggy, PA-C      . mometasone-formoterol [facility]) 200-5 MCG/ACT inhaler 2 puff  2 puff Inhalation BID Jaclyn Shaggy, PA-C   2 puff at 11/14/20 0737  . multivitamin with minerals tablet 1 tablet  1 tablet Oral Daily Comer Locket, MD   1 tablet at 11/14/20 [phone]  . nicotine (NICODERM CQ - dosed in mg/24 hours) patch 21 mg  21 mg Transdermal Daily PRN Comer Locket, MD   21 mg at 11/13/20 7948  . ondansetron (ZOFRAN-ODT) disintegrating tablet 4 mg  4 mg Oral Q6H PRN Mason Jim, Amy E, MD      . sertraline (ZOLOFT) tablet 25 mg  25 mg Oral Daily Isla Sabree, Worthy Rancher, MD   25 mg at 11/14/20 0739  . thiamine tablet 100 mg  100 mg Oral Daily Mason Jim, Amy E, MD   100 mg at 11/14/20 0741  . traZODone (DESYREL) tablet 50 mg  50 mg Oral QHS PRN Jaclyn Shaggy, PA-C   50 mg at 11/13/20 2123    Lab Results:  No results found for this or any previous visit (from the past 48 hour(s)).  Blood Alcohol level:  Lab Results   Component Value Date   ETH <10 11/10/2020   ETH <10 06/27/2020    Metabolic Disorder Labs: Lab Results  Component Value Date   HGBA1C 5.0 11/12/2020   MPG 96.8 11/12/2020   MPG 96.8 10/11/2020   No results found for: PROLACTIN Lab Results  Component Value Date   CHOL 170  11/12/2020   TRIG 118 11/12/2020   HDL 34 (L) 11/12/2020   CHOLHDL 5.0 11/12/2020   VLDL 24 11/12/2020   LDLCALC 112 (H) 11/12/2020   LDLCALC 108 (H) 06/29/2020    Physical Findings: AIMS:  , ,  ,  ,    CIWA:  CIWA-Ar Total: 0 COWS:     Musculoskeletal: Strength & Muscle Tone: within normal limits Gait & Station: normal Patient leans: N/A  Psychiatric Specialty Exam: Physical Exam Constitutional:      Appearance: She is obese.  HENT:     Head: Normocephalic.  Eyes:     Extraocular Movements: Extraocular movements intact.     Conjunctiva/sclera: Conjunctivae normal.  Pulmonary:     Effort: No respiratory distress.  Neurological:     General: No focal deficit present.     Mental Status: She is alert.     Review of Systems  Cardiovascular: Negative for chest pain.  Gastrointestinal: Negative for abdominal pain.  Neurological: Negative for headaches.  Psychiatric/Behavioral: Negative for sleep disturbance. The patient is nervous/anxious.     Blood pressure (!) 151/91, pulse 95, temperature 98.2 F (36.8 C), temperature source Oral, resp. rate 18, height 5\' 6"  (1.676 m), weight (!) 156.5 kg, SpO2 100 %.Body mass index is 55.68 kg/m.  General Appearance: Casual  Eye Contact:  Good  Speech:  Clear and Coherent  Volume:  Normal  Mood:  "really good"  Affect:  Congruent  Thought Process:  Coherent  Orientation:  NA  Thought Content:  Logical  Suicidal Thoughts:  No  Homicidal Thoughts:  No  Memory:  NA  Judgement:  Fair  Insight:  Fair  Psychomotor Activity:  Normal  Concentration:  Concentration: Good and Attention Span: Good  Recall:  Good  Fund of Knowledge:  NA  Language:   Good  Akathisia:  No  Handed:  Right  AIMS (if indicated):     Assets:  Communication Skills Desire for Improvement Financial Resources/Insurance  ADL's:  Intact  Cognition:  WNL  Sleep:  Number of Hours: 5.75    Treatment Plan Summary: Daily contact with patient to assess and evaluate symptoms and progress in treatment and Medication management   Whitney Beard is a 19 year-old female with previous diagnoses of bipolar 1 disorder,Borderline personality d/o,MDD, GAD, and ADD who presented following suicide attempt by intentional drug ingestion of sertraline/hydroxyzine in the context of recent fight with sister, worsening depression, and stopping her medication two months ago. Obtained baseline EKG and have restarted Latuda and Zoloft. Patient appears to have improved during hospitalization. Will consider discharge tomorrow if patient continues to tolerate medications and follow-up care is confirmed.  PLAN: - Continue Latuda 40mg  - Continue Zoloft 25mg  today - Claritin PRN for allergies - Vistaril 25mg  q6hours PRN anxiety - Trazodone 50mg  qhs PRN insomnia - Encourage involvement in group therapy sessions while here. - Establish follow-up, OP care. Patient would benefit from DBT. - Placed on CIWA protocol, no s/s of withdrawal at this time;  25, Medical Student 11/14/2020, 7:57 AM

## 2020-11-15 MED ORDER — TRAZODONE HCL 50 MG PO TABS: 50.0000 mg | ORAL_TABLET | Freq: Every evening | ORAL | 0 refills | Status: DC | PRN

## 2020-11-15 MED ORDER — SERTRALINE HCL 25 MG PO TABS: 25.0000 mg | ORAL_TABLET | Freq: Every day | ORAL | 0 refills | Status: DC

## 2020-11-15 MED ORDER — HYDROXYZINE HCL 25 MG PO TABS
25.0000 mg | ORAL_TABLET | Freq: Four times a day (QID) | ORAL | 0 refills | Status: DC | PRN
Start: 2020-11-15 — End: 2022-09-18

## 2020-11-15 MED ORDER — NICOTINE 21 MG/24HR TD PT24: 21.0000 mg | MEDICATED_PATCH | Freq: Every day | TRANSDERMAL | 0 refills | Status: DC | PRN

## 2020-11-15 MED ORDER — LURASIDONE HCL 40 MG PO TABS: 40.0000 mg | ORAL_TABLET | Freq: Every day | ORAL | 0 refills | Status: DC

## 2020-11-15 NOTE — Tx Team (Signed)
Interdisciplinary Treatment and Diagnostic Plan Update  11/15/2020 Time of Session: 9:05am  Whitney Beard MRN: [id number]  Principal Diagnosis: Severe bipolar I disorder, current or most recent episode depressed (HCC)  Secondary Diagnoses: Principal Problem:   Severe bipolar I disorder, current or most recent episode depressed (HCC) Active Problems:   Overdose, intentional self-harm, initial encounter (HCC)   Asthma   Anxiety disorder, unspecified   Cannabis use disorder, mild, abuse   Alcohol use   Borderline personality disorder (HCC)   Current Medications:  Current Facility-Administered Medications  Medication Dose Route Frequency Provider Last Rate Last Admin  . acetaminophen (TYLENOL) tablet 650 mg  650 mg Oral Q6H PRN Jaclyn Shaggy, PA-C   650 mg at 11/13/20 1638  . albuterol (VENTOLIN HFA) 108 (90 Base) MCG/ACT inhaler 2 puff  2 puff Inhalation Q4H PRN Jaclyn Shaggy, PA-C   2 puff at 11/14/20 0746  . alum & mag hydroxide-simeth (MAALOX/MYLANTA) 200-200-20 MG/5ML suspension 30 mL  30 mL Oral Q4H PRN Melbourne Abts W, PA-C      . hydrOXYzine (ATARAX/VISTARIL) tablet 25 mg  25 mg Oral Q6H PRN Comer Locket, MD   25 mg at 11/14/20 2121  . loratadine (CLARITIN) tablet 10 mg  10 mg Oral Daily PRN Cristofano, Paul A, MD      . lurasidone (LATUDA) tablet 40 mg  40 mg Oral Q supper Cristofano, Worthy Rancher, MD   40 mg at 11/14/20 1731  . magnesium hydroxide (MILK OF MAGNESIA) suspension 30 mL  30 mL Oral Daily PRN Jaclyn Shaggy, PA-C      . mometasone-formoterol [facility]) 200-5 MCG/ACT inhaler 2 puff  2 puff Inhalation BID Jaclyn Shaggy, PA-C   2 puff at 11/15/20 0755  . multivitamin with minerals tablet 1 tablet  1 tablet Oral Daily Comer Locket, MD   1 tablet at 11/15/20 0755  . nicotine (NICODERM CQ - dosed in mg/24 hours) patch 21 mg  21 mg Transdermal Daily PRN Comer Locket, MD   21 mg at 11/13/20 4888  . sertraline (ZOLOFT) tablet 25 mg  25 mg Oral Daily Cristofano, Worthy Rancher, MD    25 mg at 11/15/20 0755  . thiamine tablet 100 mg  100 mg Oral Daily Mason Jim, Amy E, MD   100 mg at 11/15/20 0755  . traZODone (DESYREL) tablet 50 mg  50 mg Oral QHS PRN Jaclyn Shaggy, PA-C   50 mg at 11/14/20 2121   PTA Medications: Medications Prior to Admission  Medication Sig Dispense Refill Last Dose  . albuterol (PROVENTIL) (2.5 MG/3ML) 0.083% nebulizer solution Take 3 mLs (2.5 mg total) by nebulization every 6 (six) hours as needed for wheezing or shortness of breath. 75 mL 0   . albuterol (VENTOLIN HFA) 108 (90 Base) MCG/ACT inhaler Inhale 2 puffs into the lungs every 4 (four) hours as needed for wheezing or shortness of breath. 8 g 0   . hydrOXYzine (ATARAX/VISTARIL) 25 MG tablet Take 1 tablet (25 mg total) by mouth 3 (three) times daily as needed for anxiety. (Patient not taking: No sig reported) 30 tablet 0   . lurasidone (LATUDA) 20 MG TABS tablet Take 1 tablet (20 mg total) by mouth daily with breakfast. (Patient not taking: No sig reported) 30 tablet 0   . mometasone-formoterol (DULERA) 200-5 MCG/ACT AERO Inhale 2 puffs into the lungs 2 (two) times daily. 8.8 g 0   . sertraline (ZOLOFT) 25 MG tablet Take 1 tablet (25 mg total) by  mouth daily. (Patient not taking: No sig reported) 30 tablet 0     Patient Stressors:    Patient Strengths:    Treatment Modalities: Medication Management, Group therapy, Case management,  1 to 1 session with clinician, Psychoeducation, Recreational therapy.   Physician Treatment Plan for Primary Diagnosis: Severe bipolar I disorder, current or most recent episode depressed (HCC) Long Term Goal(s): Improvement in symptoms so as ready for discharge   Short Term Goals: Ability to verbalize feelings will improve Ability to disclose and discuss suicidal ideas Ability to demonstrate self-control will improve Ability to identify and develop effective coping behaviors will improve Compliance with prescribed medications will improve Ability to  verbalize feelings will improve Ability to identify and develop effective coping behaviors will improve  Medication Management: Evaluate patient's response, side effects, and tolerance of medication regimen.  Therapeutic Interventions: 1 to 1 sessions, Unit Group sessions and Medication administration.  Evaluation of Outcomes: Adequate for Discharge  Physician Treatment Plan for Secondary Diagnosis: Principal Problem:   Severe bipolar I disorder, current or most recent episode depressed (HCC) Active Problems:   Overdose, intentional self-harm, initial encounter (HCC)   Asthma   Anxiety disorder, unspecified   Cannabis use disorder, mild, abuse   Alcohol use   Borderline personality disorder (HCC)  Long Term Goal(s): Improvement in symptoms so as ready for discharge   Short Term Goals: Ability to verbalize feelings will improve Ability to disclose and discuss suicidal ideas Ability to demonstrate self-control will improve Ability to identify and develop effective coping behaviors will improve Compliance with prescribed medications will improve Ability to verbalize feelings will improve Ability to identify and develop effective coping behaviors will improve     Medication Management: Evaluate patient's response, side effects, and tolerance of medication regimen.  Therapeutic Interventions: 1 to 1 sessions, Unit Group sessions and Medication administration.  Evaluation of Outcomes: Adequate for Discharge   RN Treatment Plan for Primary Diagnosis: Severe bipolar I disorder, current or most recent episode depressed (HCC) Long Term Goal(s): Knowledge of disease and therapeutic regimen to maintain health will improve  Short Term Goals: Ability to remain free from injury will improve, Ability to participate in decision making will improve, Ability to verbalize feelings will improve, Ability to disclose and discuss suicidal ideas and Ability to identify and develop effective coping  behaviors will improve  Medication Management: RN will administer medications as ordered by provider, will assess and evaluate patient's response and provide education to patient for prescribed medication. RN will report any adverse and/or side effects to prescribing provider.  Therapeutic Interventions: 1 on 1 counseling sessions, Psychoeducation, Medication administration, Evaluate responses to treatment, Monitor vital signs and CBGs as ordered, Perform/monitor CIWA, COWS, AIMS and Fall Risk screenings as ordered, Perform wound care treatments as ordered.  Evaluation of Outcomes: Adequate for Discharge   LCSW Treatment Plan for Primary Diagnosis: Severe bipolar I disorder, current or most recent episode depressed (HCC) Long Term Goal(s): Safe transition to appropriate next level of care at discharge, Engage patient in therapeutic group addressing interpersonal concerns.  Short Term Goals: Engage patient in aftercare planning with referrals and resources, Increase social support, Increase emotional regulation, Facilitate acceptance of mental health diagnosis and concerns, Identify triggers associated with mental health/substance abuse issues and Increase skills for wellness and recovery  Therapeutic Interventions: Assess for all discharge needs, 1 to 1 time with Social worker, Explore available resources and support systems, Assess for adequacy in community support network, Educate family and significant other(s) on suicide  prevention, Complete Psychosocial Assessment, Interpersonal group therapy.  Evaluation of Outcomes: Adequate for Discharge   Progress in Treatment: Attending groups: Yes. Participating in groups: Yes. Taking medication as prescribed: Yes. Toleration medication: Yes. Family/Significant other contact made: Yes, individual(s) contacted:  Mother Patient understands diagnosis: Yes. and No. Discussing patient identified problems/goals with staff: Yes. Medical problems  stabilized or resolved: Yes. Denies suicidal/homicidal ideation: Yes. Issues/concerns per patient self-inventory: No.   New problem(s) identified: No, Describe:  None  New Short Term/Long Term Goal(s): medication stabilization, elimination of SI thoughts, development of comprehensive mental wellness plan.   Patient Goals:  "To get back on my medications"   Discharge Plan or Barriers: Patient recently admitted. CSW will continue to follow and assess for appropriate referrals and possible discharge planning.   Reason for Continuation of Hospitalization: Medication stabilization  Estimated Length of Stay: Adequate for discharge    Attendees: Patient: Whitney Beard 11/15/2020   Physician: Pricilla Larsson, MD 11/15/2020   Nursing:  11/15/2020   RN Care Manager: 11/15/2020   Social Worker: Melba Coon, LCSWA 11/15/2020   Recreational Therapist:  11/15/2020   Other:  11/15/2020  Other:  11/15/2020   Other: 11/15/2020     Scribe for Treatment Team: Aram Beecham, LCSWA 11/15/2020 9:30 AM

## 2020-11-15 NOTE — Progress Notes (Signed)
Discharge Note:  Patient denies SI/HI AVH at this time. Discharge instructions, AVS, prescriptions and transition record gone over with patient. Patient agrees to comply with medication management, follow-up visit, and outpatient therapy. Patient belongings returned to patient. Patient questions and concerns addressed and answered.  Patient ambulatory off unit.  Patient discharged to home with father.

## 2020-11-15 NOTE — BHH Suicide Risk Assessment (Signed)
[facility] Discharge Suicide Risk Assessment   Principal Problem: Severe bipolar I disorder, current or most recent episode depressed [facility]) Discharge Diagnoses: Principal Problem:   Severe bipolar I disorder, current or most recent episode depressed (HCC) Active Problems:   Overdose, intentional self-harm, initial encounter (HCC)   Asthma   Anxiety disorder, unspecified   Cannabis use disorder, mild, abuse   Alcohol use   Borderline personality disorder (HCC)   Total Time spent with patient: 20 minutes  Musculoskeletal: Strength & Muscle Tone: within normal limits Gait & Station: normal Patient leans: N/A  Psychiatric Specialty Exam: Review of Systems  Blood pressure (!) 151/89, pulse (!) 103, temperature 98.2 F (36.8 C), temperature source Oral, resp. rate 18, height 5\' 6"  (1.676 m), weight (!) 156.5 kg, SpO2 100 %.Body mass index is 55.68 kg/m.  General Appearance: Casual  Eye Contact::  Fair  Speech:  Clear and Coherent409  Volume:  Normal  Mood:  Euthymic  Affect:  Appropriate  Thought Process:  Coherent  Orientation:  Full (Time, Place, and Person)  Thought Content:  Logical  Suicidal Thoughts:  No  Homicidal Thoughts:  No  Memory:  Recent;   Fair  Judgement:  Fair  Insight:  Fair  Psychomotor Activity:  Normal  Concentration:  Fair  Recall:  [id number] of Knowledge:Fair  Language: Fair  Akathisia:  No  Handed:  Right  AIMS (if indicated):     Assets:  Communication Skills Desire for Improvement Housing Resilience  Sleep:  Number of Hours: 6.25  Cognition: WNL  ADL's:  Intact   Mental Status Per Nursing Assessment::   On Admission:  Suicidal ideation indicated by patient  Demographic Factors:  Adolescent or young adult  Loss Factors: NA  Historical Factors: Prior suicide attempts and Impulsivity  Risk Reduction Factors:   Sense of responsibility to family, Living with another person, especially a relative and Positive social support  Continued  Clinical Symptoms:  Personality Disorders:   Cluster B  Cognitive Features That Contribute To Risk:  None    Suicide Risk:  Mild:  Suicidal ideation of limited frequency, intensity, duration, and specificity.  There are no identifiable plans, no associated intent, mild dysphoria and related symptoms, good self-control (both objective and subjective assessment), few other risk factors, and identifiable protective factors, including available and accessible social support.   Follow-up Information    [facility]. Go on 11/19/2020.   Specialty: Behavioral Health Why: You have a walk in appointment for therapy services on 11/19/20 at 7:45 am.  You also have a walk in appointment on 12/10/19 at 7:45 am.  Walk in appointments are first come, first served and are held in person.   Contact information: [address] [place] [phone]       [facility] Follow up.   Specialty: Professional Counselor Why: You may also go to this provider for therapy services during the new patient walk in hours of:  Monday through Friday, 8:30 am to 12:00 pm and 1:00 pm to 2:30 pm.   Contact information: Wednesday of the [employer] [address] [place] [phone]               Plan Of Care/Follow-up recommendations:  Other:  Follow-up with outpatient care  [phone], MD 11/15/2020, 9:32 AM

## 2020-11-15 NOTE — Progress Notes (Signed)
Patient pleasant and cooperative visible in the dayroom during the evening and she was compliant with medications. She denies depression and she denies suicidal ideation. No behavioral issues to report on shift at time.

## 2020-11-15 NOTE — Progress Notes (Signed)
Recreation Therapy Notes  Date:  12.17.21 Time: 0930 Location: [address] Group Room  Group Topic: Stress Management  Goal Area(s) Addresses:  Patient will identify positive stress management techniques. Patient will identify benefits of using stress management post d/c.  Behavioral Response: Engaged  Intervention: Stress Management  Activity: Progressive Muscle Relaxation.  LRT led group in progressive muscle relaxation.  Patients were to tense their muscles but not to the point of strain and then release the tension.  This was done for each muscle group individually. If patients had any areas that were sore or injured they could skip those affected areas.  Patients were to listen and follow along as LRT read script.    Education:  Stress Management, Discharge Planning.   Education Outcome: Acknowledges Education  Clinical Observations/Feedback: Pt attended and participated in group activity.     Lottie Sigman, LRT/CTRS         Alicyn Klann A 11/15/2020 10:16 AM 

## 2020-11-15 NOTE — Progress Notes (Signed)
  [facility] Adult Case Management Discharge Plan :  Will you be returning to the same living situation after discharge:  Yes,  personal residence At discharge, do you have transportation home?: Yes,  via mother Do you have the ability to pay for your medications: Yes,  has mcd  Release of information consent forms completed and in the chart;  Patient's signature needed at discharge.  Patient to Follow up at:  Follow-up Information    [facility]. Go on 11/19/2020.   Specialty: Behavioral Health Why: You have a walk in appointment for therapy services on 11/19/20 at 7:45 am.  You also have a walk in appointment on 12/10/19 at 7:45 am.  Walk in appointments are first come, first served and are held in person.   Contact information: [address] [place] [address] [phone]       [facility] Follow up.   Specialty: Professional Counselor Why: You may also go to this provider for therapy services during the new patient walk in hours of:  Monday through Friday, 8:30 am to 12:00 pm and 1:00 pm to 2:30 pm.   Contact information: [employer] of the [place] [address] [place] [address] [phone]               Next level of care provider has access to [facility] Link:yes  Safety Planning and Suicide Prevention discussed: Mardella Layman [phone] (Mother)      Has patient been referred to the Quitline?: Patient refused referral  Patient has been referred for addiction treatment: N/A  Felizardo Hoffmann, Theresia Majors 11/15/2020, 9:26 AM

## 2020-11-15 NOTE — BHH Group Notes (Signed)
Pt did not attend morning goals group. 

## 2020-11-15 NOTE — Plan of Care (Signed)
Cooperative with treatment regime, patient mood seems to be improving and stabilization of psychiatric symptoms has also improved.

## 2020-11-15 NOTE — Discharge Summary (Signed)
Physician Discharge Summary Note  Patient:  Whitney Beard is an 19 y.o., female MRN:  [id number] DOB:  May 25, 2001 Patient phone:  [phone] (home)  Patient address:   [address] [place] [address],  Total Time spent with patient: Greater than 30 minutes  Date of Admission:  11/11/2020 Date of Discharge: 11-15-20  Reason for Admission: Intentional drug overdose on Sertraline in a suicide attempt.   Principal Problem: Severe bipolar I disorder, current or most recent episode depressed [facility])  Discharge Diagnoses: Principal Problem:   Severe bipolar I disorder, current or most recent episode depressed (HCC) Active Problems:   Overdose, intentional self-harm, initial encounter (HCC)   Asthma   Anxiety disorder, unspecified   Cannabis use disorder, mild, abuse   Alcohol use   Borderline personality disorder (HCC)  Past Psychiatric History: See H&P  Past Medical History:  Past Medical History:  Diagnosis Date  . ADHD (attention deficit hyperactivity disorder)   . Allergic rhinitis   . Asthma   . Bipolar 1 disorder (HCC)   . Diabetes mellitus without complication (HCC)    states today 11/11/20 "was told in the past that she was pre- diabetic"  . Obesity   . Sleep apnea   . Suicide attempt by drug ingestion [facility])     Past Surgical History:  Procedure Laterality Date  . ADENOIDECTOMY    . TONSILLECTOMY     Family History:  Family History  Problem Relation Age of Onset  . Bipolar disorder Father   . Schizophrenia Father   . Liver disease Father   . Asthma Father   . Asthma Maternal Grandmother   . Schizophrenia Maternal Grandmother   . SIDS Maternal Aunt   . Heart disease Paternal Grandmother   . Diabetes Paternal Grandmother   . Seizures Paternal Grandmother    Family Psychiatric  History: Bipolar Disorder: Father.                                                 Schizophrenia" Grandmother.                                                  PTSD: Mother Social History:  Social History   Substance and Sexual Activity  Alcohol Use Yes   Comment: 1-3 times a week      Social History   Substance and Sexual Activity  Drug Use Yes  . Types: Marijuana    Social History   Socioeconomic History  . Marital status: Single    Spouse name: Not on file  . Number of children: Not on file  . Years of education: Not on file  . Highest education level: High school graduate  Occupational History  . Not on file  Tobacco Use  . Smoking status: Current Every Day Smoker  . Smokeless tobacco: Never Used  . Tobacco comment: vapes   Vaping Use  . Vaping Use: Every day  . Substances: Nicotine  Substance and Sexual Activity  . Alcohol use: Yes    Comment: 1-3 times a week   . Drug use: Yes    Types: Marijuana  . Sexual activity: Never    Birth control/protection: None  Other  Topics Concern  . Not on file  Social History Narrative   Lives with 19y/o sister. Is one of 8 siblings.   Social Determinants of Health   Financial Resource Strain: Not on file  Food Insecurity: Not on file  Transportation Needs: Not on file  Physical Activity: Not on file  Stress: Not on file  Social Connections: Not on file   Hospital Course: (Per Md's admission evaluation notes): Soraida Vickers is a 19 year-old female with previous diagnoses of bipolar 1 disorder, MDD, GAD, and ADHD who presented following suicide attempt by intentional drug ingestion of sertraline/hydroxyzine. Patient was brought to [facility] ED via EMS after sister discovered she took 45 pills of her sertraline and hydroxyzine (unknown doses). The patient states the drug overdose was an intentional suicide attempt. She reports that she had a fight with one of her sisters two days prior when her sister told her "my family doesn't love me" and "no one will care if I die". She states that her younger sister, whom she lives with, continued to spend time with this sister and she perceived this to  mean she "took her side". She reports her biological father contacted her to share he had cirrhosis which was another stressor this week. She endorses feeling depressed daily for the last month prior to this argument. She endorses feelings of low mood, little interest in painting/photography, difficulty falling and staying asleep (sleeping from 2am-6am), decreased energy, guilt about how her mental illness impacts her family, decreased concentration, and suicidal thoughts. She has not been keeping her room clean in her apartment and has been isolating more. She reports a history of bipolar 1 diagnosis and states her last episode of mania was in August where she had a period of 1-2 weeks during which she didn't sleep at all, felt "untouchable", spent all her money, endorsed racing thoughts, and pressured speech. The patient states she has had 11 prior suicide attempts, including slitting her wrists, intentionally having an accident in her car, and taking pills. She reports 11 prior hospitalizations for each attempt. Her last hospitalization was at [facility] on 06/2020 during which she was started on Latuda 20mg  and Zoloft 25mg . She states the medication initially helped, however she stopped taking it two months ago because she still felt sad and like it didn't help enough. She denies any side effects. She denies SI, intent or plan today and can contract for safety. She denies any previous h/o psychosis or AVH. She admits she has had anger episodes and periods of aggressive behaviors with family in the remote past but denies any history of previous homicidal behaviors. She admits that she has felt fidgety and restless at times and has a general sense of anxiety in the hospital.  Patient endorses daily THC use of several joints and bowls a day. Patient states she vapes everyday and is currently experiencing cravings for nicotine. She states she is struggling with her alcohol use. She reports drinking 3x/week on average,  with anywhere between 4 drinks to one bottle per sitting. When asked to clarify, patient states it is sometimes a fifth or pint of vodka in one sitting. She states her last drink was on Thursday. She denies any previous alcohol withdrawal or DT symptoms. She denies any current alcohol cravings or signs of withdrawal.  This is one of several psychiatric discharge summaries from the [facility] systems for this 19 year old AA female. She is with hx of chronic mental illness (depression), multiple suicide attempts,  substance use disorders & multiple psychiatric admissions. [facility] stabilization treatments since her childhood years. [name] has been tried on multiple psychotropic medications for her symptoms & it appeared she may not have been compliant with her recommended treatment regimen. She was brought to the hospital this time around for evaluation & treatment after an intentional suicide attempt by overdose on her home medications after a fight with one of her sisters.   After evaluation of her presenting symptoms, Lilianna was recommended for mood stabilization treatments. The medication regimen for her presenting symptoms were discussed & with her consent initiated. She received, stabilized & was discharged on the medications as listed below on her discharge medication lists. She was also enrolled & participated in the group counseling sessions being offered & held on this unit. She learned coping skills. She presented on this admission, other chronic medical conditions that required treatment & monitoring. She was resumed, treated & discharged on all her pertinent home medications for those pre-existing health issues. She tolerated her treatment regimen without any adverse effects or reactions reported.  During the course of her hospitalization, the 15-minute checks were adequate to ensure Biana's safety.  Patient did not display any dangerous, violent or suicidal  behavior on the unit. She interacted with patients & staff appropriately, participated appropriately in the group sessions/therapies. Her medications were addressed & adjusted to meet her needs. She was recommended for outpatient follow-up care & medication management upon discharge to assure her continuity of care.  At the time of discharge, patient is not reporting any acute suicidal/homicidal ideations. She feels more confident about her self-care & in managing her symptoms. She currently denies any new issues or concerns. Education and supportive counseling provided throughout her hospital stay & upon discharge.  Today upon her discharge evaluation with the attending psychiatrist, [name] shares she is doing well. She denies any other specific concerns. She is sleeping well. Her appetite is good. She denies other physical complaints. She denies AH/VH, delusional thoughts or paranoia. She feels that her medications have been helpful & is in agreement to continue her current treatment regimen as recommended. She was able to engage in safety planning including plan to [facility] or contact emergency services if she feels unable to maintain her own safety or the safety of others. Pt had no further questions, comments, or concerns. [facility] in no apparent distress. Transportation per family (mother).   Physical Findings: AIMS:  , ,  ,  ,    CIWA:  CIWA-Ar Total: 0 COWS:     Musculoskeletal: Strength & Muscle Tone: within normal limits Gait & Station: normal Patient leans: N/A  Psychiatric Specialty Exam: Physical Exam Vitals and nursing note reviewed.  HENT:     Head: Normocephalic.     Nose: Nose normal.     Mouth/Throat:     Pharynx: Oropharynx is clear.  Eyes:     Pupils: Pupils are equal, round, and reactive to light.  Cardiovascular:     Rate and Rhythm: Normal rate.     Pulses: Normal pulses.  Pulmonary:     Effort: Pulmonary effort is normal.   Abdominal:     Palpations: Abdomen is soft.  Genitourinary:    Comments: Deferred Musculoskeletal:        General: Normal range of motion.     Cervical back: Normal range of motion.  Skin:    General: Skin is warm and dry.  Neurological:     General: No focal deficit present.     Mental Status: She is alert and oriented to person, place, and time.     Review of Systems  Constitutional: Negative for diaphoresis and fever.  HENT: Negative for rhinorrhea, sneezing and sore throat.   Eyes: Negative for itching.  Respiratory: Negative for cough, shortness of breath and wheezing.   Cardiovascular: Negative for chest pain and palpitations.  Gastrointestinal: Negative for diarrhea, nausea and vomiting.  Endocrine: Negative for cold intolerance.  Genitourinary: Negative for difficulty urinating.  Musculoskeletal: Negative for arthralgias and myalgias.  Skin: Negative for color change.  Neurological: Negative for dizziness, tremors, seizures, syncope, facial asymmetry, speech difficulty, weakness, light-headedness, numbness and headaches.  Psychiatric/Behavioral: Positive for dysphoric mood (Stabi;ized with medication prior to discharge) and sleep disturbance (Stabilized with medication prior to discharge). Negative for agitation, behavioral problems, confusion, decreased concentration, hallucinations, self-injury and suicidal ideas. The patient is not nervous/anxious (Stable upon discharge) and is not hyperactive.     Blood pressure (!) 151/89, pulse (!) 103, temperature 98.2 F (36.8 C), temperature source Oral, resp. rate 18, height 5\' 6"  (1.676 m), weight (!) 156.5 kg, SpO2 100 %.Body mass index is 55.68 kg/m.  See Md's discharge SRA  Sleep:  Number of Hours: 6.25   Has this patient used any form of tobacco in the last 30 days? (Cigarettes, Smokeless Tobacco, Cigars, and/or Pipes) Yes, No  Blood Alcohol level:  Lab Results  Component Value Date   ETH <10 11/10/2020   ETH <10  06/27/2020   Metabolic Disorder Labs:  Lab Results  Component Value Date   HGBA1C 5.0 11/12/2020   MPG 96.8 11/12/2020   MPG 96.8 10/11/2020   No results found for: PROLACTIN Lab Results  Component Value Date   CHOL 170 11/12/2020   TRIG 118 11/12/2020   HDL 34 (L) 11/12/2020   CHOLHDL 5.0 11/12/2020   VLDL 24 11/12/2020   LDLCALC 112 (H) 11/12/2020   LDLCALC 108 (H) 06/29/2020   See Psychiatric Specialty Exam and Suicide Risk Assessment completed by Attending Physician prior to discharge.  Discharge destination:  Home  Is patient on multiple antipsychotic therapies at discharge:  No   Has Patient had three or more failed trials of antipsychotic monotherapy by history:  No  Recommended Plan for Multiple Antipsychotic Therapies: NA  Allergies as of 11/15/2020      Reactions   Other Shortness Of Breath   Pt allergic to animals such as cats and dogs and pollen Reaction: sneezing, asthma trigger       Medication List    TAKE these medications     Indication  albuterol 108 (90 Base) MCG/ACT inhaler Commonly known as: VENTOLIN HFA Inhale 2 puffs into the lungs every 4 (four) hours as needed for wheezing or shortness of breath.  Indication: Asthma   albuterol (2.5 MG/3ML) 0.083% nebulizer solution Commonly known as: PROVENTIL Take 3 mLs (2.5 mg total) by nebulization every 6 (six) hours as needed for wheezing or shortness of breath.  Indication: Asthma   hydrOXYzine 25 MG tablet Commonly known as: ATARAX/VISTARIL Take 1 tablet (25 mg total) by mouth every 6 (six) hours as needed for anxiety. What changed: when to take this  Indication: Feeling Anxious   lurasidone 40 MG Tabs tablet Commonly known as: LATUDA Take 1 tablet (40 mg total) by mouth daily with supper. For mood control What changed:   medication strength  how much to take  when to take this  additional instructions  Indication: Mood control   mometasone-formoterol 200-5 MCG/ACT Aero Commonly  known as: DULERA Inhale 2 puffs into the lungs 2 (two) times daily.  Indication: Asthma   nicotine 21 mg/24hr patch Commonly known as: NICODERM CQ - dosed in mg/24 hours Place 1 patch (21 mg total) onto the skin daily as needed. For mood control  Indication: Nicotine Addiction   sertraline 25 MG tablet Commonly known as: ZOLOFT Take 1 tablet (25 mg total) by mouth daily. For depression What changed: additional instructions  Indication: Major Depressive Disorder   traZODone 50 MG tablet Commonly known as: DESYREL Take 1 tablet (50 mg total) by mouth at bedtime as needed for sleep.  Indication: Trouble Sleeping       Follow-up Information    [facility]. Go on 11/19/2020.   Specialty: Behavioral Health Why: You have a walk in appointment for therapy services on 11/19/20 at 7:45 am.  You also have a walk in appointment on 12/10/19 at 7:45 am.  Walk in appointments are first come, first served and are held in person.   Contact information: [address] [place] [address] [phone]       [facility] Follow up.   Specialty: Professional Counselor Why: You may also go to this provider for therapy services during the new patient walk in hours of:  Monday through Friday, 8:30 am to 12:00 pm and 1:00 pm to 2:30 pm.   Contact information: [employer] [facility] [place] [address] [place] [address] [phone]              Follow-up recommendations: Activity:  As tolerated Diet: As recommended by your primary care doctor. Keep all scheduled follow-up appointments as recommended.  Comments:  : Prescriptions given at discharge.  Patient agreeable to plan.  Given opportunity to ask questions.  Appears to feel comfortable with discharge denies any current suicidal or homicidal thought. Patient is also instructed prior to discharge to: Take all medications as prescribed by her mental healthcare  provider. Report any adverse effects and or reactions from the medicines to her outpatient provider promptly. Patient has been instructed & cautioned: To not engage in alcohol and or illegal drug use while on prescription medicines. In the event of worsening symptoms, patient is instructed to call the crisis hotline, 911 and or go to the nearest ED for appropriate evaluation and treatment of symptoms. To follow-up with her primary care provider for your other medical issues, concerns and or health care needs.  Signed: Armandina Stammer, NP, PMHNP, FNP-BC 11/15/2020, 9:46 AM

## 2021-04-05 ENCOUNTER — Inpatient Hospital Stay (HOSPITAL_BASED_OUTPATIENT_CLINIC_OR_DEPARTMENT_OTHER)
Admission: EM | Admit: 2021-04-05 | Discharge: 2021-04-07 | DRG: 202 | Disposition: A | Discharge status: Home / Self Care | Payer: Medicaid Other | Attending: Internal Medicine | Admitting: Internal Medicine

## 2021-04-05 ENCOUNTER — Other Ambulatory Visit: Payer: Self-pay

## 2021-04-05 ENCOUNTER — Emergency Department (HOSPITAL_BASED_OUTPATIENT_CLINIC_OR_DEPARTMENT_OTHER): Payer: Medicaid Other

## 2021-04-05 ENCOUNTER — Encounter (HOSPITAL_BASED_OUTPATIENT_CLINIC_OR_DEPARTMENT_OTHER): Payer: Self-pay | Admitting: Emergency Medicine

## 2021-04-05 DIAGNOSIS — E119 Type 2 diabetes mellitus without complications: Secondary | ICD-10-CM | POA: Diagnosis present

## 2021-04-05 DIAGNOSIS — Z833 Family history of diabetes mellitus: Secondary | ICD-10-CM

## 2021-04-05 DIAGNOSIS — J45901 Unspecified asthma with (acute) exacerbation: Secondary | ICD-10-CM

## 2021-04-05 DIAGNOSIS — G4730 Sleep apnea, unspecified: Secondary | ICD-10-CM | POA: Diagnosis present

## 2021-04-05 DIAGNOSIS — J9601 Acute respiratory failure with hypoxia: Secondary | ICD-10-CM

## 2021-04-05 DIAGNOSIS — Z6841 Body Mass Index (BMI) 40.0 and over, adult: Secondary | ICD-10-CM | POA: Diagnosis not present

## 2021-04-05 DIAGNOSIS — Z28310 Unvaccinated for covid-19: Secondary | ICD-10-CM

## 2021-04-05 DIAGNOSIS — Z91048 Other nonmedicinal substance allergy status: Secondary | ICD-10-CM

## 2021-04-05 DIAGNOSIS — R0902 Hypoxemia: Secondary | ICD-10-CM | POA: Diagnosis present

## 2021-04-05 DIAGNOSIS — Z825 Family history of asthma and other chronic lower respiratory diseases: Secondary | ICD-10-CM | POA: Diagnosis not present

## 2021-04-05 DIAGNOSIS — F319 Bipolar disorder, unspecified: Secondary | ICD-10-CM | POA: Diagnosis present

## 2021-04-05 DIAGNOSIS — E876 Hypokalemia: Secondary | ICD-10-CM | POA: Diagnosis present

## 2021-04-05 DIAGNOSIS — Z20822 Contact with and (suspected) exposure to covid-19: Secondary | ICD-10-CM | POA: Diagnosis present

## 2021-04-05 DIAGNOSIS — Z9114 Patient's other noncompliance with medication regimen: Secondary | ICD-10-CM | POA: Diagnosis not present

## 2021-04-05 DIAGNOSIS — F909 Attention-deficit hyperactivity disorder, unspecified type: Secondary | ICD-10-CM | POA: Diagnosis present

## 2021-04-05 DIAGNOSIS — E66813 Obesity, class 3: Secondary | ICD-10-CM

## 2021-04-05 DIAGNOSIS — Z79899 Other long term (current) drug therapy: Secondary | ICD-10-CM | POA: Diagnosis not present

## 2021-04-05 DIAGNOSIS — F411 Generalized anxiety disorder: Secondary | ICD-10-CM | POA: Diagnosis present

## 2021-04-05 DIAGNOSIS — Z7951 Long term (current) use of inhaled steroids: Secondary | ICD-10-CM

## 2021-04-05 DIAGNOSIS — Z818 Family history of other mental and behavioral disorders: Secondary | ICD-10-CM | POA: Diagnosis not present

## 2021-04-05 DIAGNOSIS — F329 Major depressive disorder, single episode, unspecified: Secondary | ICD-10-CM | POA: Diagnosis present

## 2021-04-05 DIAGNOSIS — J4551 Severe persistent asthma with (acute) exacerbation: Secondary | ICD-10-CM | POA: Diagnosis not present

## 2021-04-05 LAB — COMPREHENSIVE METABOLIC PANEL
ALT: 13 U/L (ref 0–44)
AST: 17 U/L (ref 15–41)
Albumin: 3.8 g/dL (ref 3.5–5.0)
Alkaline Phosphatase: 69 U/L (ref 38–126)
Anion gap: 12 (ref 5–15)
BUN: 9 mg/dL (ref 6–20)
CO2: 22 mmol/L (ref 22–32)
Calcium: 8.4 mg/dL — ABNORMAL LOW (ref 8.9–10.3)
Chloride: 103 mmol/L (ref 98–111)
Creatinine, Ser: 0.7 mg/dL (ref 0.44–1.00)
GFR, Estimated: >60 mL/min (ref >60)
Glucose, Bld: 101 mg/dL — ABNORMAL HIGH (ref 70–99)
Potassium: 2.9 mmol/L — ABNORMAL LOW (ref 3.5–5.1)
Sodium: 137 mmol/L (ref 135–145)
Total Bilirubin: 0.5 mg/dL (ref 0.3–1.2)
Total Protein: 7.1 g/dL (ref 6.5–8.1)

## 2021-04-05 LAB — TROPONIN I (HIGH SENSITIVITY)
Troponin I (High Sensitivity): <2 ng/L (ref <18)
Troponin I (High Sensitivity): <2 ng/L (ref <18)

## 2021-04-05 LAB — CBC WITH DIFFERENTIAL/PLATELET
Abs Immature Granulocytes: 0.02 10*3/uL (ref 0.00–0.07)
Basophils Absolute: 0.1 10*3/uL (ref 0.0–0.1)
Basophils Relative: 1 %
Eosinophils Absolute: 0.1 10*3/uL (ref 0.0–0.5)
Eosinophils Relative: 1 %
HCT: 35.5 % — ABNORMAL LOW (ref 36.0–46.0)
Hemoglobin: 12.1 g/dL (ref 12.0–15.0)
Immature Granulocytes: 0 %
Lymphocytes Relative: 12 %
Lymphs Abs: 1.1 10*3/uL (ref 0.7–4.0)
MCH: 31.6 pg (ref 26.0–34.0)
MCHC: 34.1 g/dL (ref 30.0–36.0)
MCV: 92.7 fL (ref 80.0–100.0)
Monocytes Absolute: 0.6 10*3/uL (ref 0.1–1.0)
Monocytes Relative: 6 %
Neutro Abs: 7.6 10*3/uL (ref 1.7–7.7)
Neutrophils Relative %: 80 %
Platelets: 265 10*3/uL (ref 150–400)
RBC: 3.83 MIL/uL — ABNORMAL LOW (ref 3.87–5.11)
RDW: 13.2 % (ref 11.5–15.5)
WBC: 9.4 10*3/uL (ref 4.0–10.5)
nRBC: 0 % (ref 0.0–0.2)

## 2021-04-05 LAB — RESP PANEL BY RT-PCR (FLU A&B, COVID) ARPGX2
Influenza A by PCR: NEGATIVE
Influenza B by PCR: NEGATIVE
SARS Coronavirus 2 by RT PCR: NEGATIVE

## 2021-04-05 LAB — BRAIN NATRIURETIC PEPTIDE: B Natriuretic Peptide: 21.7 pg/mL (ref 0.0–100.0)

## 2021-04-05 MED ORDER — MAGNESIUM SULFATE 50 % IJ SOLN
2.0000 g | Freq: Once | INTRAMUSCULAR | Status: AC
  Administered 2021-04-05: 2 g via INTRAVENOUS
  Filled 2021-04-05: qty 4

## 2021-04-05 MED ORDER — IPRATROPIUM BROMIDE 0.02 % IN SOLN
0.5000 mg | Freq: Once | RESPIRATORY_TRACT | Status: AC
  Administered 2021-04-05: 0.5 mg via RESPIRATORY_TRACT
  Filled 2021-04-05: qty 2.5

## 2021-04-05 MED ORDER — ALBUTEROL (5 MG/ML) CONTINUOUS INHALATION SOLN
10.0000 mg/h | INHALATION_SOLUTION | Freq: Once | RESPIRATORY_TRACT | Status: AC
  Administered 2021-04-05: 10 mg/h via RESPIRATORY_TRACT
  Filled 2021-04-05: qty 20

## 2021-04-05 MED ORDER — POTASSIUM CHLORIDE CRYS ER 20 MEQ PO TBCR
40.0000 meq | EXTENDED_RELEASE_TABLET | Freq: Once | ORAL | Status: AC
  Administered 2021-04-05: 40 meq via ORAL
  Filled 2021-04-05: qty 2

## 2021-04-05 MED ORDER — ALBUTEROL SULFATE (2.5 MG/3ML) 0.083% IN NEBU
2.5000 mg | INHALATION_SOLUTION | RESPIRATORY_TRACT | Status: DC | PRN
  Administered 2021-04-05 (×2): 2.5 mg via RESPIRATORY_TRACT
  Filled 2021-04-05 (×2): qty 3

## 2021-04-05 NOTE — ED Notes (Signed)
[facility] at facility for transport 

## 2021-04-05 NOTE — ED Triage Notes (Signed)
Pt arrives EMS, reports hx of asthma, c/o shob and nausea. Pt received albuterol and atrovent and solumedrol on EMS in route.Pt coughing, audible wheezing

## 2021-04-05 NOTE — ED Notes (Signed)
RT spoke with PA about risk/benefit of doing CAT vs MDI. Decided that she needed the neb ASAP. Swabbed for covid prior to placing on neb

## 2021-04-05 NOTE — Progress Notes (Signed)
RT asked patient if she ever wore CPAP.  Patient stated she had the test for sleep apnea.  Patient stated she did not have sleep apnea at that time but was at the beginning of it.

## 2021-04-05 NOTE — Progress Notes (Signed)
Patient SPO2 is 94% and HR is 135 while ambulating on room air.  Patient stated she felt a little dizzy while walking.  Patient SPO2 drops as low as 86% and HR is 122 while sleeping on room air.  Placed patient on 3 liter nasal cannula - Patient's SPO2 while sleeping in 94%, HR is 122, and RR is 31.  Patient has end expiratory wheeze on the right and inspiratory/expiratory wheezing on the left.

## 2021-04-05 NOTE — ED Notes (Signed)
Pt arrives EMS, reports hx of asthma. Received 15.5 albuterol, 125 solumedrol. On 100% NRB, 10L. HR 110. 20, LAC

## 2021-04-05 NOTE — ED Notes (Addendum)
EMS brought patent in on NRB, SAT 100%. Currently on 2L = SAT 97%. Endorsed giving her 12.5 mg Albuterol, 0.5 mg Atrovent. Still wheezing. Does have low grade temp. RT to wait on MD to assess for further orders.

## 2021-04-05 NOTE — ED Provider Notes (Signed)
[facility] EMERGENCY DEPARTMENT Provider Note   CSN: [id number] Arrival date & time: 04/05/21  1739     History Chief Complaint  Patient presents with  . Shortness of Breath    Whitney Beard is a 20 y.o. female history of asthma, diabetes, allergic rhinitis, obesity, bipolar 1 disorder, cannabis use.  Patient presents with a chief complaint of shortness of breath.  Patient reports that her shortness of breath began this morning approximately 0600.  Patient reports that her shortness of breath has gradually worsened throughout the day.  Patient reports no relief with albuterol inhaler as well as home albuterol nebulizer treatment.  Patient endorses rhinorrhea, nausea, and vomiting.  Patient has vomited twice in the last 24 hours.  Patient describes emesis as clear denies any bloody emesis or coffee-ground emesis.  Patient denies any fevers, chills, cough, nasal congestion, loss of smell or taste, chest pain, unilateral leg swelling or tenderness, abdominal pain, blood in stool, constipation, diarrhea, melena, surgery last 12 weeks, history of DVT or PE, history of cancer, hemoptysis.    Patient denies any known sick contacts.  Patient has not received vaccination for COVID-19 or influenza.  Patient endorses previous hospitalization for asthma exacerbation.  Patient denies any intubation for asthma exacerbation.  Reports that she supposed to be taking a daily inhaled corticosteroid however has not been taking his medication for the last few months due to running out of this medication.  Patient received 12.5 mg albuterol, 0.5 mg Atrovent, 125 mg Solu-Medrol, and 4 mg Zofran with EMS.  Patient reports minimal relief of her symptoms after this treatment.  HPI     Past Medical History:  Diagnosis Date  . ADHD (attention deficit hyperactivity disorder)   . Allergic rhinitis   . Asthma   . Bipolar 1 disorder (HCC)   . Diabetes mellitus without complication (HCC)    states today  11/11/20 "was told in the past that she was pre- diabetic"  . Obesity   . Sleep apnea   . Suicide attempt by drug ingestion [facility])     Patient Active Problem List   Diagnosis Date Noted  . Bipolar 1 disorder (HCC) 11/11/2020  . Severe bipolar I disorder, current or most recent episode depressed (HCC) 11/11/2020  . Anxiety disorder, unspecified 11/11/2020  . Cannabis use disorder, mild, abuse 11/11/2020  . Alcohol use 11/11/2020  . Borderline personality disorder (HCC) 11/11/2020  . SOB (shortness of breath) 10/11/2020  . Acute respiratory failure with hypoxia (HCC) 10/11/2020  . Acute asthma exacerbation 10/10/2020  . Major depressive disorder, recurrent episode (HCC) 06/28/2020  . Anxiety   . Asthma 05/17/2019  . Diabetes mellitus type 2 in obese (HCC) 03/09/2019  . Suicide attempt (HCC)   . Hypokalemia 12/26/2018  . OSA (obstructive sleep apnea) 12/26/2018  . Morbid obesity with BMI of 50.0-59.9, adult (HCC) 12/26/2018  . Asthma exacerbation 10/16/2017  . Status asthmaticus 01/19/2017  . 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 Overdose, intentional self-harm, initial encounter [facility])     Past Surgical History:  Procedure Laterality Date  . ADENOIDECTOMY    . TONSILLECTOMY       OB History   No obstetric history on file.     Family History  Problem Relation Age of Onset  . Bipolar disorder Father   . Schizophrenia Father   . Liver disease Father   . Asthma Father   . Asthma Maternal Grandmother   . Schizophrenia Maternal Grandmother   . SIDS Maternal Aunt   .  Heart disease Paternal Grandmother   . Diabetes Paternal Grandmother   . Seizures Paternal Grandmother     Social History   Tobacco Use  . Smoking status: Current Every Day Smoker  . Smokeless tobacco: Never Used  . Tobacco comment: vapes   Vaping Use  . Vaping Use: Every day  . Substances: Nicotine  Substance Use Topics  . Alcohol use: Yes    Comment: 1-3 times a week   . Drug use: Yes     Types: Marijuana    Home Medications Prior to Admission medications   Medication Sig Start Date End Date Taking? Authorizing Provider  albuterol (PROVENTIL) (2.5 MG/3ML) 0.083% nebulizer solution Take 3 mLs (2.5 mg total) by nebulization every 6 (six) hours as needed for wheezing or shortness of breath. 10/12/20   Darlin DropHall, Carole N, DO  albuterol (VENTOLIN HFA) 108 (90 Base) MCG/ACT inhaler Inhale 2 puffs into the lungs every 4 (four) hours as needed for wheezing or shortness of breath. 10/12/20   Darlin DropHall, Carole N, DO  hydrOXYzine (ATARAX/VISTARIL) 25 MG tablet Take 1 tablet (25 mg total) by mouth every 6 (six) hours as needed for anxiety. 11/15/20   Armandina StammerNwoko, Agnes I, NP  lurasidone (LATUDA) 40 MG TABS tablet Take 1 tablet (40 mg total) by mouth daily with supper. For mood control 11/15/20   Nwoko, Nicole KindredAgnes I, NP  mometasone-formoterol (DULERA) 200-5 MCG/ACT AERO Inhale 2 puffs into the lungs 2 (two) times daily. 10/12/20 11/11/20  Darlin DropHall, Carole N, DO  nicotine (NICODERM CQ - DOSED IN MG/24 HOURS) 21 mg/24hr patch Place 1 patch (21 mg total) onto the skin daily as needed. For mood control 11/15/20   Armandina StammerNwoko, Agnes I, NP  sertraline (ZOLOFT) 25 MG tablet Take 1 tablet (25 mg total) by mouth daily. For depression 11/15/20   Armandina StammerNwoko, Agnes I, NP  traZODone (DESYREL) 50 MG tablet Take 1 tablet (50 mg total) by mouth at bedtime as needed for sleep. 11/15/20   Sanjuana KavaNwoko, Agnes I, NP    Allergies    Other  Review of Systems   Review of Systems  Constitutional: Negative for chills and fever.  HENT: Positive for rhinorrhea. Negative for congestion and sore throat.   Eyes: Negative for visual disturbance.  Respiratory: Negative for cough and shortness of breath.   Cardiovascular: Negative for chest pain and leg swelling.  Gastrointestinal: Positive for nausea and vomiting. Negative for abdominal distention, abdominal pain, anal bleeding, blood in stool, constipation, diarrhea and rectal pain.  Genitourinary: Negative  for difficulty urinating, dysuria, frequency, hematuria, pelvic pain, vaginal bleeding, vaginal discharge and vaginal pain.  Musculoskeletal: Negative for back pain and neck pain.  Skin: Negative for color change and rash.  Neurological: Negative for dizziness, syncope, light-headedness and headaches.  Psychiatric/Behavioral: Negative for confusion.    Physical Exam Updated Vital Signs BP 119/81 (BP Location: Right Arm)   Pulse (!) 119   Temp 99.9 F (37.7 C)   Resp (!) 28   Ht 5\' 6"  (1.676 m)   SpO2 97%   BMI 55.68 kg/m   Physical Exam Vitals and nursing note reviewed.  Constitutional:      General: She is not in acute distress.    Appearance: She is morbidly obese. She is ill-appearing. She is not toxic-appearing or diaphoretic.  HENT:     Head: Normocephalic.  Eyes:     General: No scleral icterus.       Right eye: No discharge.        Left eye:  No discharge.  Cardiovascular:     Rate and Rhythm: Tachycardia present.     Heart sounds: Normal heart sounds.     Comments: Tachycardia at rate of 119  Pulmonary:     Effort: Tachypnea present. No bradypnea, accessory muscle usage or respiratory distress.     Breath sounds: No stridor. Examination of the right-upper field reveals wheezing. Examination of the left-upper field reveals wheezing. Examination of the right-middle field reveals wheezing. Examination of the left-middle field reveals wheezing. Examination of the right-lower field reveals wheezing. Examination of the left-lower field reveals wheezing. Wheezing present.     Comments: Patient speaks in shortened sentences  Inspiratory and expiratory wheezing noted in all lung fields Abdominal:     General: There is no distension. There are no signs of injury.     Palpations: Abdomen is soft. There is no mass or pulsatile mass.     Tenderness: There is no abdominal tenderness. There is no guarding or rebound.     Hernia: There is no hernia in the umbilical area or ventral  area.     Comments: Abdominal exam hindered by patient's body habitus  Musculoskeletal:     Cervical back: Normal range of motion and neck supple.     Right lower leg: No tenderness. No edema.     Left lower leg: No tenderness. No edema.  Skin:    General: Skin is warm and dry.     Coloration: Skin is not cyanotic or pale.  Neurological:     General: No focal deficit present.     Mental Status: She is alert.     GCS: GCS eye subscore is 4. GCS verbal subscore is 5. GCS motor subscore is 6.  Psychiatric:        Behavior: Behavior is cooperative.     ED Results / Procedures / Treatments   Labs (all labs ordered are listed, but only abnormal results are displayed) Labs Reviewed  CBC WITH DIFFERENTIAL/PLATELET - Abnormal; Notable for the following components:      Result Value   RBC 3.83 (*)    HCT 35.5 (*)    All other components within normal limits  RESP PANEL BY RT-PCR (FLU A&B, COVID) [id number]  COMPREHENSIVE METABOLIC PANEL  BRAIN NATRIURETIC PEPTIDE  TROPONIN I (HIGH SENSITIVITY)    EKG EKG Interpretation  Date/Time:  Saturday Apr 05 2021 18:45:04 EDT Ventricular Rate:  122 PR Interval:  150 QRS Duration: 86 QT Interval:  318 QTC Calculation: 453 R Axis:   86 Text Interpretation: Sinus tachycardia  similar morphology when compared to prior on 10/09/20 Abnormal ECG Confirmed by Tilden Fossa [phone]) on 04/05/2021 6:57:31 PM   Radiology DG Chest Portable 1 View  Result Date: 04/05/2021 CLINICAL DATA:  Shortness of breath EXAM: PORTABLE CHEST 1 VIEW COMPARISON:  10/09/2020 FINDINGS: The heart size and mediastinal contours are within normal limits. Both lungs are clear. The visualized skeletal structures are unremarkable. IMPRESSION: No active disease. Electronically Signed   By: Alcide Clever M.D.   On: 04/05/2021 18:45    Procedures .Critical Care Performed by: Haskel Schroeder, PA-C Authorized by: Haskel Schroeder, PA-C   Critical care provider statement:     Critical care time (minutes):  45   Critical care was necessary to treat or prevent imminent or life-threatening deterioration of the following conditions:  Respiratory failure   Critical care was time spent personally by me on the following activities:  Discussions with consultants, evaluation of patient's response to  treatment, examination of patient, ordering and performing treatments and interventions, ordering and review of laboratory studies, ordering and review of radiographic studies, pulse oximetry, re-evaluation of patient's condition, obtaining history from patient or surrogate and review of old charts   Care discussed with: admitting provider      Medications Ordered in ED Medications  magnesium sulfate (IV Push/IM) injection 2 g (2 g Intravenous Given 04/05/21 1812)  albuterol (PROVENTIL,VENTOLIN) solution continuous neb (10 mg/hr Nebulization Given 04/05/21 1812)    ED Course  I have reviewed the triage vital signs and the nursing notes.  Pertinent labs & imaging results that were available during my care of the patient were reviewed by me and considered in my medical decision making (see chart for details).    MDM Rules/Calculators/A&P                          Alert 20 year old female, ill-appearing, and no acute distress.  Patient presents with complaint of shortness of breath.  Patient has history of asthma.  Patient had worsening shortness of breath throughout the day.  No relief with home albuterol inhaler and home albuterol nebulizer treatment.  Patient received 12.5 mg albuterol, 0.5 mg Atrovent, 125 mg Solu-Medrol, and 4 mg Zofran with EMS.  Patient reports minimal relief of her symptoms after this treatment.  Patient speaking in shortened sentences.  Oxygen saturation 96% on 2 L of oxygen via nasal cannula.  Patient has expiratory and inspiratory wheezing to all lung fields.  No cyanosis or pallor noted.  Will give patient 2 g magnesium and continuous nebulizer.  We  will swab patient for COVID-19 and influenza.  Will obtain chest x-ray to evaluate for possible pneumonia.  Will obtain basic lab and EKG.  Patient reevaluated while still continuing nebulizer treatment.  Patient reports minimal improvement in her symptoms.  Patient still noted to have respiratory expiratory wheezing in all fields.  Will reassess.  EKG shows sinus tachycardia  similar morphology when compared to prior on 10/09/20.  Due to changes in morphology will obtain troponin and BNP.  Troponin less than 2; low suspicion for ACS as patient has no chest pain will obtain second troponin. BNP within normal limits; low suspicion for cardiomyopathy or acute heart failure. CBC shows no leukocytosis. CMP shows potassium at 2.9.  This is likely secondary to albuterol treatment.  We will give patient 40 mEq of oral potassium. COVID-19 and influenza testing negative.  After completing albuterol nebulizer treatment patient reports improvement in her breathing status.  Patient still noted to have expiratory and inspiratory wheezing in all lung fields.  When removed from supplemental oxygen patient's saturations 90 to 92% on room air.  I personally stood and ambulated patient for 1 minute and with his oxygen saturation dropping to 88% on room air.  Patient also noted to desat into the 80s when sleeping on no supplemental oxygen.  Will be patient additional round of albuterol treatment.  We will add Atrovent to treatment regiment.  Due to patient's desaturation when not on supplemental oxygen will consult hospitalist for transfer and admission.  2135 spoke with Dr. Antionette Char who agreed to have patient transferred for admission.  Patient is agreeable with this plan.   Final Clinical Impression(s) / ED Diagnoses Final diagnoses:  Exacerbation of asthma, unspecified asthma severity, unspecified whether persistent  Acute respiratory failure with hypoxia (HCC)    Rx / DC Orders ED Discharge Orders    None  Berneice Heinrich 04/06/21 0253    Tilden Fossa, MD 04/06/21 2012

## 2021-04-05 NOTE — ED Notes (Signed)
Per EMS, pt also received 4mg  zofran pta.

## 2021-04-05 NOTE — ED Notes (Signed)
Update on POC given to mother

## 2021-04-06 ENCOUNTER — Encounter (HOSPITAL_COMMUNITY): Payer: Self-pay | Admitting: Family Medicine

## 2021-04-06 DIAGNOSIS — E119 Type 2 diabetes mellitus without complications: Secondary | ICD-10-CM | POA: Diagnosis not present

## 2021-04-06 DIAGNOSIS — F319 Bipolar disorder, unspecified: Secondary | ICD-10-CM | POA: Diagnosis not present

## 2021-04-06 DIAGNOSIS — J4551 Severe persistent asthma with (acute) exacerbation: Secondary | ICD-10-CM | POA: Diagnosis not present

## 2021-04-06 LAB — COMPREHENSIVE METABOLIC PANEL
ALT: 12 U/L (ref 0–44)
AST: 17 U/L (ref 15–41)
Albumin: 3.6 g/dL (ref 3.5–5.0)
Alkaline Phosphatase: 66 U/L (ref 38–126)
Anion gap: 10 (ref 5–15)
BUN: 8 mg/dL (ref 6–20)
CO2: 18 mmol/L — ABNORMAL LOW (ref 22–32)
Calcium: 8.9 mg/dL (ref 8.9–10.3)
Chloride: 108 mmol/L (ref 98–111)
Creatinine, Ser: 0.66 mg/dL (ref 0.44–1.00)
GFR, Estimated: >60 mL/min (ref >60)
Glucose, Bld: 134 mg/dL — ABNORMAL HIGH (ref 70–99)
Potassium: 4 mmol/L (ref 3.5–5.1)
Sodium: 136 mmol/L (ref 135–145)
Total Bilirubin: 0.8 mg/dL (ref 0.3–1.2)
Total Protein: 6.8 g/dL (ref 6.5–8.1)

## 2021-04-06 LAB — GLUCOSE, CAPILLARY: Glucose-Capillary: 104 mg/dL — ABNORMAL HIGH (ref 70–99)

## 2021-04-06 LAB — CBC WITH DIFFERENTIAL/PLATELET
Abs Immature Granulocytes: 0.02 10*3/uL (ref 0.00–0.07)
Basophils Absolute: 0 10*3/uL (ref 0.0–0.1)
Basophils Relative: 0 %
Eosinophils Absolute: 0 10*3/uL (ref 0.0–0.5)
Eosinophils Relative: 0 %
HCT: 36.4 % (ref 36.0–46.0)
Hemoglobin: 12.4 g/dL (ref 12.0–15.0)
Immature Granulocytes: 0 %
Lymphocytes Relative: 8 %
Lymphs Abs: 0.5 10*3/uL — ABNORMAL LOW (ref 0.7–4.0)
MCH: 32 pg (ref 26.0–34.0)
MCHC: 34.1 g/dL (ref 30.0–36.0)
MCV: 94.1 fL (ref 80.0–100.0)
Monocytes Absolute: 0.1 10*3/uL (ref 0.1–1.0)
Monocytes Relative: 2 %
Neutro Abs: 6 10*3/uL (ref 1.7–7.7)
Neutrophils Relative %: 90 %
Platelets: 254 10*3/uL (ref 150–400)
RBC: 3.87 MIL/uL (ref 3.87–5.11)
RDW: 13.2 % (ref 11.5–15.5)
WBC: 6.7 10*3/uL (ref 4.0–10.5)
nRBC: 0 % (ref 0.0–0.2)

## 2021-04-06 LAB — MAGNESIUM: Magnesium: 2.1 mg/dL (ref 1.7–2.4)

## 2021-04-06 LAB — HEMOGLOBIN A1C
Hgb A1c MFr Bld: 5 % (ref 4.8–5.6)
Mean Plasma Glucose: 96.8 mg/dL

## 2021-04-06 MED ORDER — METHYLPREDNISOLONE SODIUM SUCC 125 MG IJ SOLR
80.0000 mg | Freq: Two times a day (BID) | INTRAMUSCULAR | Status: DC
  Administered 2021-04-06: 80 mg via INTRAVENOUS
  Filled 2021-04-06: qty 2

## 2021-04-06 MED ORDER — IPRATROPIUM-ALBUTEROL 0.5-2.5 (3) MG/3ML IN SOLN
3.0000 mL | RESPIRATORY_TRACT | Status: DC | PRN
  Filled 2021-04-06: qty 3

## 2021-04-06 MED ORDER — ACETAMINOPHEN 325 MG PO TABS: 650.0000 mg | ORAL_TABLET | Freq: Four times a day (QID) | ORAL | Status: DC | PRN

## 2021-04-06 MED ORDER — ONDANSETRON HCL 4 MG PO TABS: 4.0000 mg | ORAL_TABLET | Freq: Four times a day (QID) | ORAL | Status: DC | PRN

## 2021-04-06 MED ORDER — METHYLPREDNISOLONE SODIUM SUCC 125 MG IJ SOLR
60.0000 mg | Freq: Two times a day (BID) | INTRAMUSCULAR | Status: DC
  Administered 2021-04-06 (×2): 60 mg via INTRAVENOUS
  Filled 2021-04-06 (×2): qty 2

## 2021-04-06 MED ORDER — ENOXAPARIN SODIUM 40 MG/0.4ML IJ SOSY
40.0000 mg | PREFILLED_SYRINGE | Freq: Every day | INTRAMUSCULAR | Status: DC
  Administered 2021-04-06 – 2021-04-07 (×2): 40 mg via SUBCUTANEOUS
  Filled 2021-04-06 (×2): qty 0.4

## 2021-04-06 MED ORDER — IPRATROPIUM-ALBUTEROL 0.5-2.5 (3) MG/3ML IN SOLN: 3.0000 mL | RESPIRATORY_TRACT | Status: DC | PRN

## 2021-04-06 MED ORDER — INSULIN ASPART 100 UNIT/ML IJ SOLN: 0.0000 [IU] | Freq: Three times a day (TID) | INTRAMUSCULAR | Status: DC

## 2021-04-06 MED ORDER — ONDANSETRON HCL 4 MG/2ML IJ SOLN: 4.0000 mg | Freq: Four times a day (QID) | INTRAMUSCULAR | Status: DC | PRN

## 2021-04-06 MED ORDER — TRAZODONE HCL 50 MG PO TABS
50.0000 mg | ORAL_TABLET | Freq: Every evening | ORAL | Status: DC | PRN
  Filled 2021-04-06: qty 1

## 2021-04-06 MED ORDER — INSULIN ASPART 100 UNIT/ML IJ SOLN: 0.0000 [IU] | Freq: Every day | INTRAMUSCULAR | Status: DC

## 2021-04-06 MED ORDER — ACETAMINOPHEN 650 MG RE SUPP: 650.0000 mg | Freq: Four times a day (QID) | RECTAL | Status: DC | PRN

## 2021-04-06 MED ORDER — HYDROXYZINE HCL 25 MG PO TABS
25.0000 mg | ORAL_TABLET | Freq: Four times a day (QID) | ORAL | Status: DC | PRN
  Filled 2021-04-06: qty 1

## 2021-04-06 MED ORDER — POLYETHYLENE GLYCOL 3350 17 G PO PACK: 17.0000 g | PACK | Freq: Every day | ORAL | Status: DC | PRN

## 2021-04-06 MED ORDER — IPRATROPIUM-ALBUTEROL 0.5-2.5 (3) MG/3ML IN SOLN
3.0000 mL | Freq: Four times a day (QID) | RESPIRATORY_TRACT | Status: DC
  Administered 2021-04-06 – 2021-04-07 (×7): 3 mL via RESPIRATORY_TRACT
  Filled 2021-04-06 (×5): qty 3

## 2021-04-06 NOTE — Progress Notes (Signed)
PROGRESS NOTE    Whitney Beard  SEG:[id number] DOB: 01/29/2001 DOA: 04/05/2021 PCP: Patient, No Pcp Per (Inactive)    Brief Narrative:  19yo who presents with asthma exacerbation. Pt was continued on steroids and neb tx  Assessment & Plan:   Principal Problem:   Severe persistent asthma with (acute) exacerbation Active Problems:   Hypokalemia   Type 2 diabetes mellitus without complication, without long-term current use of insulin (HCC)   Class 3 severe obesity due to excess calories with serious comorbidity and body mass index (BMI) of 50.0 to 59.9 in adult [facility])   Major depressive disorder   Bipolar 1 disorder (HCC)  Principal Problem:   Severe persistent asthma with (acute) exacerbation   Patient presenting with 1 day history of substantial shortness of breath and wheezing concerning for asthma exacerbation  Patient follows with pulmonology at [facility], and would benefit from arrangement of close outpatient follow-up at time of discharge  Currently remains with both inspiratory and expiratory wheezing with decreased lung sounds throughout, still requiring 4LNC this AM. Pt is O2 naive  Have increased IV steroids to 80mg  IV bid  Increase frequency of nebs to q2hrs PRN  Active Problems:   Type 2 diabetes mellitus without complication, without long-term current use of insulin (HCC)   History of uncomplicated diabetes mellitus type 2 Per review of records in care everywhere  Continue on SSI coverage  Hemoglobin A1c of 5.8  Generalized anxiety disorder, major depressive disorder and bipolar 1 disorder   Patient reports that she is no longer taking any of her psychotropic medications prescribed to her during her December 2021 admission  Cont on PRN hydroxyzine for anxiety and trazodone for sleep    Class 3 severe obesity due to excess calories with serious comorbidity and body mass index (BMI) of 50.0 to 59.9 in adult [facility])   Recommend  diet/lifestyle modification  Hypokalemia   Likely secondary to bronchodilator use earlier today  Patient is already been administered 40 miliequivalents of oral potassium by the emergency department  Will monitor potassium and magnesium levels with serial chemistries  DVT prophylaxis: Lovenox subq Code Status: Full Family Communication: Pt in room, family not at bedside  Status is: Inpatient  Remains inpatient appropriate because:Inpatient level of care appropriate due to severity of illness   Dispo: The patient is from: Home              Anticipated d/c is to: Home              Patient currently is not medically stable to d/c.   Difficult to place patient No   Consultants:     Procedures:     Antimicrobials: Anti-infectives (From admission, onward)   None       Subjective: Still feeling sob and wheezing  Objective: Vitals:   04/06/21 0728 04/06/21 0744 04/06/21 1241 04/06/21 1336  BP: (!) 148/90  135/70   Pulse: 94  80   Resp: 17  20   Temp: 98.5 F (36.9 C)  98.5 F (36.9 C)   TempSrc: Oral  Oral   SpO2: 97% 97% 100% 98%  Weight:      Height:       No intake or output data in the 24 hours ending 04/06/21 1453 Filed Weights   04/05/21 2357  Weight: (!) 145.1 kg    Examination: General exam: Awake, laying in bed, in nad Respiratory system: Increased resp effort, inspiratory/expiratory wheezing Cardiovascular system: regular rate, s1, s2  Gastrointestinal system: Soft, nondistended, positive BS Central nervous system: CN2-12 grossly intact, strength intact Extremities: Perfused, no clubbing Skin: Normal skin turgor, no notable skin lesions seen Psychiatry: Mood normal // no visual hallucinations   Data Reviewed: I have personally reviewed following labs and imaging studies  CBC: Recent Labs  Lab 04/05/21 1806 04/06/21 0158  WBC 9.4 6.7  NEUTROABS 7.6 6.0  HGB 12.1 12.4  HCT 35.5* 36.4  MCV 92.7 94.1  PLT 265 254   Basic Metabolic  Panel: Recent Labs  Lab 04/05/21 1806 04/06/21 0158  NA 137 136  K 2.9* 4.0  CL 103 108  CO2 22 18*  GLUCOSE 101* 134*  BUN 9 8  CREATININE 0.70 0.66  CALCIUM 8.4* 8.9  MG  --  2.1   GFR: Estimated Creatinine Clearance: 165.8 mL/min (by C-G formula based on SCr of 0.66 mg/dL). Liver Function Tests: Recent Labs  Lab 04/05/21 1806 04/06/21 0158  AST 17 17  ALT 13 12  ALKPHOS 69 66  BILITOT 0.5 0.8  PROT 7.1 6.8  ALBUMIN 3.8 3.6   No results for input(s): LIPASE, AMYLASE in the last 168 hours. No results for input(s): AMMONIA in the last 168 hours. Coagulation Profile: No results for input(s): INR, PROTIME in the last 168 hours. Cardiac Enzymes: No results for input(s): CKTOTAL, CKMB, CKMBINDEX, TROPONINI in the last 168 hours. BNP (last 3 results) No results for input(s): PROBNP in the last 8760 hours. HbA1C: Recent Labs    04/06/21 0158  HGBA1C 5.0   CBG: No results for input(s): GLUCAP in the last 168 hours. Lipid Profile: No results for input(s): CHOL, HDL, LDLCALC, TRIG, CHOLHDL, LDLDIRECT in the last 72 hours. Thyroid Function Tests: No results for input(s): TSH, T4TOTAL, FREET4, T3FREE, THYROIDAB in the last 72 hours. Anemia Panel: No results for input(s): VITAMINB12, FOLATE, FERRITIN, TIBC, IRON, RETICCTPCT in the last 72 hours. Sepsis Labs: No results for input(s): PROCALCITON, LATICACIDVEN in the last 168 hours.  Recent Results (from the past 240 hour(s))  Resp Panel by RT-PCR (Flu A&B, Covid) Nasopharyngeal Swab     Status: None   Collection Time: 04/05/21  6:06 PM   Specimen: Nasopharyngeal Swab; Nasopharyngeal(NP) swabs in vial transport medium  Result Value Ref Range Status   SARS Coronavirus 2 by RT PCR NEGATIVE NEGATIVE Final    Comment: (NOTE) SARS-CoV-2 target nucleic acids are NOT DETECTED.  The SARS-CoV-2 RNA is generally detectable in upper respiratory specimens during the acute phase of infection. The lowest concentration of  SARS-CoV-2 viral copies this assay can detect is 138 copies/mL. A negative result does not preclude SARS-Cov-2 infection and should not be used as the sole basis for treatment or other patient management decisions. A negative result may occur with  improper specimen collection/handling, submission of specimen other than nasopharyngeal swab, presence of viral mutation(s) within the areas targeted by this assay, and inadequate number of viral copies(<138 copies/mL). A negative result must be combined with clinical observations, patient history, and epidemiological information. The expected result is Negative.  Fact Sheet for Patients:  BloggerCourse.com  Fact Sheet for Healthcare Providers:  SeriousBroker.it  This test is no t yet approved or cleared by the [place] FDA and  has been authorized for detection and/or diagnosis of SARS-CoV-2 by FDA under an Emergency Use Authorization (EUA). This EUA will remain  in effect (meaning this test can be used) for the duration of the COVID-19 declaration under Section 564(b)(1) of the Act, 21 U.S.C.section 360bbb-3(b)(1), unless the  authorization is terminated  or revoked sooner.       Influenza A by PCR NEGATIVE NEGATIVE Final   Influenza B by PCR NEGATIVE NEGATIVE Final    Comment: (NOTE) The Xpert Xpress SARS-CoV-2/FLU/RSV plus assay is intended as an aid in the diagnosis of influenza from Nasopharyngeal swab specimens and should not be used as a sole basis for treatment. Nasal washings and aspirates are unacceptable for Xpert Xpress SARS-CoV-2/FLU/RSV testing.  Fact Sheet for Patients: BloggerCourse.com  Fact Sheet for Healthcare Providers: SeriousBroker.it  This test is not yet approved or cleared by the [place] FDA and has been authorized for detection and/or diagnosis of SARS-CoV-2 by FDA under an Emergency Use  Authorization (EUA). This EUA will remain in effect (meaning this test can be used) for the duration of the COVID-19 declaration under Section 564(b)(1) of the Act, 21 U.S.C. section 360bbb-3(b)(1), unless the authorization is terminated or revoked.  Performed at [facility], [address] [place], [place] [address]      Radiology Studies: DG Chest Portable 1 View  Result Date: 04/05/2021 CLINICAL DATA:  Shortness of breath EXAM: PORTABLE CHEST 1 VIEW COMPARISON:  10/09/2020 FINDINGS: The heart size and mediastinal contours are within normal limits. Both lungs are clear. The visualized skeletal structures are unremarkable. IMPRESSION: No active disease. Electronically Signed   By: Alcide Clever M.D.   On: 04/05/2021 18:45    Scheduled Meds: . enoxaparin (LOVENOX) injection  40 mg Subcutaneous Daily  . insulin aspart  0-15 Units Subcutaneous TID WC  . insulin aspart  0-5 Units Subcutaneous QHS  . ipratropium-albuterol  3 mL Nebulization Q6H  . methylPREDNISolone (SOLU-MEDROL) injection  80 mg Intravenous BID   Continuous Infusions:   LOS: 1 day   Rickey Barbara, MD [facility] Pager On Amion  If 7PM-7AM, please contact night-coverage 04/06/2021, 2:53 PM

## 2021-04-06 NOTE — Progress Notes (Signed)
Patient is interested in receiving Covid vaccine, MD on call made aware for possible ordering and vaccination

## 2021-04-06 NOTE — Progress Notes (Signed)
RT note- O2 decreased to 4L

## 2021-04-06 NOTE — H&P (Addendum)
History and Physical    Whitney Beard SPQ:[id number] DOB: 19-Jan-2001 DOA: 04/05/2021  PCP: Patient, No Pcp Per (Inactive)  Patient coming from: [facility] ED   Chief Complaint:  Chief Complaint  Patient presents with  . Shortness of Breath     HPI:    20 year old female with past medical history of severe persistent asthma, obesity, ADHD, generalized anxiety disorder, major depressive disorder, bipolar 1 disorder and previous drug overdose 10/2020 who presents to [facility] emergency department as a transfer from [facility] for shortness of breath and asthma exacerbation.  Patient explains that early in the morning on 5/7 she woke from sleep short of breath.  Patient describes the shortness of breath as rather sudden onset, severe in intensity worse with any movement of all and improved with rest.  Shortness of breath is associated with significant wheezing and nonproductive cough.  Patient denies any associated fevers or chest pain.    Upon further questioning patient describes in the past several days she has felt like she has been in her usual state of health.  Patient reports that she is compliant with all of her medications including her inhalers.  Patient denies recent travel, sick contacts or contacts with confirmed COVID-19 infection.  Over the next several hours, the patient attempted to use her home nebulized albuterol with no improvement of symptoms.  Patient attempted to go to work later that morning but shortly after arriving felt so short of breath that went back home.  After her mother insisted, EMS was contacted and the patient was promptly brought into [facility] emergency department for evaluation.  In route, EMS administered Solu-Medrol and an albuterol nebulized treatment.  Upon evaluation at [facility] emergency department patient was found to be in respiratory distress and wheezing and felt be suffering from an asthma exacerbation.   Patient was administered continued rounds of albuterol as well as 2 g of intravenous magnesium sulfate.  Chest x-ray was performed revealing no evidence of acute infiltrate.  Due to patient's ongoing respiratory distress and wheezing the hospitalist group was then called and the patient has been accepted for transfer to the progressive unit on 2 W. at [facility].  Review of Systems:   Review of Systems  Constitutional: Positive for malaise/fatigue.  Respiratory: Positive for cough, shortness of breath and wheezing.   Neurological: Positive for weakness.  All other systems reviewed and are negative.   Past Medical History:  Diagnosis Date  . ADHD (attention deficit hyperactivity disorder)   . Allergic rhinitis   . Asthma   . Bipolar 1 disorder (HCC)   . Diabetes mellitus without complication (HCC)    states today 11/11/20 "was told in the past that she was pre- diabetic"  . Obesity   . Sleep apnea   . Suicide attempt by drug ingestion [facility])     Past Surgical History:  Procedure Laterality Date  . ADENOIDECTOMY    . TONSILLECTOMY       reports that she has been smoking. She has never used smokeless tobacco. She reports current alcohol use. She reports current drug use. Drug: Marijuana.  Allergies  Allergen Reactions  . Other Shortness Of Breath    Pt allergic to animals such as cats and dogs and pollen Reaction: sneezing, asthma trigger     Family History  Problem Relation Age of Onset  . Bipolar disorder Father   . Schizophrenia Father   . Liver disease Father   .  Asthma Father   . Asthma Maternal Grandmother   . Schizophrenia Maternal Grandmother   . SIDS Maternal Aunt   . Heart disease Paternal Grandmother   . Diabetes Paternal Grandmother   . Seizures Paternal Grandmother      Prior to Admission medications   Medication Sig Start Date End Date Taking? Authorizing Provider  albuterol (PROVENTIL) (2.5 MG/3ML) 0.083% nebulizer solution Take 3 mLs (2.5  mg total) by nebulization every 6 (six) hours as needed for wheezing or shortness of breath. 10/12/20   [name], DO  albuterol (VENTOLIN HFA) 108 (90 Base) MCG/ACT inhaler Inhale 2 puffs into the lungs every 4 (four) hours as needed for wheezing or shortness of breath. 10/12/20   [name], DO  hydrOXYzine (ATARAX/VISTARIL) 25 MG tablet Take 1 tablet (25 mg total) by mouth every 6 (six) hours as needed for anxiety. 11/15/20   Armandina Stammer I, NP  lurasidone (LATUDA) 40 MG TABS tablet Take 1 tablet (40 mg total) by mouth daily with supper. For mood control 11/15/20   Nwoko, Nicole Kindred I, NP  mometasone-formoterol (DULERA) 200-5 MCG/ACT AERO Inhale 2 puffs into the lungs 2 (two) times daily. 10/12/20 11/11/20  [name], DO  nicotine (NICODERM CQ - DOSED IN MG/24 HOURS) 21 mg/24hr patch Place 1 patch (21 mg total) onto the skin daily as needed. For mood control 11/15/20   Armandina Stammer I, NP  sertraline (ZOLOFT) 25 MG tablet Take 1 tablet (25 mg total) by mouth daily. For depression 11/15/20   Armandina Stammer I, NP  traZODone (DESYREL) 50 MG tablet Take 1 tablet (50 mg total) by mouth at bedtime as needed for sleep. 11/15/20   Armandina Stammer I, NP    Physical Exam: Vitals:   04/05/21 2230 04/05/21 2256 04/05/21 2301 04/05/21 2357  BP:  (!) 142/79  (!) 98/46  Pulse: (!) 105 99  (!) 107  Resp: (!) 33 19  18  Temp:    98.6 F (37 C)  TempSrc:    Oral  SpO2: 92% 94% 93% 95%  Weight:    (!) 145.1 kg  Height:    5\' 6"  (1.676 m)    Constitutional: Awake alert and oriented x3, patient is anxious and in mild respiratory distress.  Patient is obese. Skin: no rashes, no lesions, good skin turgor noted. Eyes: Pupils are equally reactive to light.  No evidence of scleral icterus or conjunctival pallor.  ENMT: Moist mucous membranes noted.  Posterior pharynx clear of any exudate or lesions.   Neck: normal, supple, no masses, no thyromegaly.  No evidence of jugular venous distension.   Respiratory:  Notable expiratory and expiratory wheezing.  Notable amount of wheezing, from the upper airway.  No rales noted.  Increased respiratory effort without evidence of accessory muscle use.    Cardiovascular: Regular rate and rhythm, no murmurs / rubs / gallops. No extremity edema. 2+ pedal pulses. No carotid bruits.  Chest:   Nontender without crepitus or deformity.   Back:   Nontender without crepitus or deformity. Abdomen: Abdomen is protuberant but soft and nontender.  No evidence of intra-abdominal masses.  Positive bowel sounds noted in all quadrants.   Musculoskeletal: No joint deformity upper and lower extremities. Good ROM, no contractures. Normal muscle tone.  Neurologic: CN 2-12 grossly intact. Sensation intact.  Patient moving all 4 extremities spontaneously.  Patient is following all commands.  Patient is responsive to verbal stimuli.   Psychiatric: Patient exhibits anxious mood with labile affect.  Patient seems to possess insight as to their current situation.     Labs on Admission: I have personally reviewed following labs and imaging studies -   CBC: Recent Labs  Lab 04/05/21 1806  WBC 9.4  NEUTROABS 7.6  HGB 12.1  HCT 35.5*  MCV 92.7  PLT 265   Basic Metabolic Panel: Recent Labs  Lab 04/05/21 1806  NA 137  K 2.9*  CL 103  CO2 22  GLUCOSE 101*  BUN 9  CREATININE 0.70  CALCIUM 8.4*   GFR: Estimated Creatinine Clearance: 165.8 mL/min (by C-G formula based on SCr of 0.7 mg/dL). Liver Function Tests: Recent Labs  Lab 04/05/21 1806  AST 17  ALT 13  ALKPHOS 69  BILITOT 0.5  PROT 7.1  ALBUMIN 3.8   No results for input(s): LIPASE, AMYLASE in the last 168 hours. No results for input(s): AMMONIA in the last 168 hours. Coagulation Profile: No results for input(s): INR, PROTIME in the last 168 hours. Cardiac Enzymes: No results for input(s): CKTOTAL, CKMB, CKMBINDEX, TROPONINI in the last 168 hours. BNP (last 3 results) No results for input(s): PROBNP in the  last 8760 hours. HbA1C: No results for input(s): HGBA1C in the last 72 hours. CBG: No results for input(s): GLUCAP in the last 168 hours. Lipid Profile: No results for input(s): CHOL, HDL, LDLCALC, TRIG, CHOLHDL, LDLDIRECT in the last 72 hours. Thyroid Function Tests: No results for input(s): TSH, T4TOTAL, FREET4, T3FREE, THYROIDAB in the last 72 hours. Anemia Panel: No results for input(s): VITAMINB12, FOLATE, FERRITIN, TIBC, IRON, RETICCTPCT in the last 72 hours. Urine analysis:    Component Value Date/Time   COLORURINE YELLOW 01/24/2017 1140   APPEARANCEUR HAZY (A) 01/24/2017 1140   LABSPEC 1.025 01/24/2017 1140   PHURINE 5.0 01/24/2017 1140   GLUCOSEU NEGATIVE 01/24/2017 1140   HGBUR NEGATIVE 01/24/2017 1140   BILIRUBINUR NEGATIVE 01/24/2017 1140   KETONESUR NEGATIVE 01/24/2017 1140   PROTEINUR NEGATIVE 01/24/2017 1140   UROBILINOGEN 0.2 01/08/2013 2339   NITRITE NEGATIVE 01/24/2017 1140   LEUKOCYTESUR NEGATIVE 01/24/2017 1140    Radiological Exams on Admission - Personally Reviewed: DG Chest Portable 1 View  Result Date: 04/05/2021 CLINICAL DATA:  Shortness of breath EXAM: PORTABLE CHEST 1 VIEW COMPARISON:  10/09/2020 FINDINGS: The heart size and mediastinal contours are within normal limits. Both lungs are clear. The visualized skeletal structures are unremarkable. IMPRESSION: No active disease. Electronically Signed   By: Alcide CleverMark  Lukens M.D.   On: 04/05/2021 18:45    EKG: Personally reviewed.  Rhythm is sinus tachycardia with heart rate of 122 bpm.  No dynamic ST segment changes appreciated.  Assessment/Plan Principal Problem:   Severe persistent asthma with (acute) exacerbation   Patient presenting with 1 day history of substantial shortness of breath and wheezing concerning for asthma exacerbation  Patient is complaining of a cough but this cough is nonproductive and chest x-ray reveals no evidence of pneumonia.  I do not believe that there is any evidence of  superimposed infection  Placing patient on scheduled intravenous Solu-Medrol for now should likely quickly be switched to oral prednisone as patient clinically improves  Providing patient with aggressive bronchodilator therapy  Supplemental oxygen as necessary for bouts of hypoxia  COVID-19 testing negative  [facility], and would benefit from arrangement of close outpatient follow-up at time of discharge  Active Problems:   Type 2 diabetes mellitus without complication, without long-term current use of insulin (HCC)   History of  uncomplicated diabetes mellitus type 2 Per review of records in care everywhere  Patient is likely to experience potential hyperglycemia in the setting of systemic steroid  Placing patient on Accu-Cheks before every meal and nightly with sliding scale insulin  Hemoglobin A1c pending  Generalized anxiety disorder, major depressive disorder and bipolar 1 disorder   Patient reports that she is no longer taking any of her psychotropic medications prescribed to her during her December 2021 admission  On as needed hydroxyzine for bouts of anxiety as well as trazodone nightly as needed for now    Class 3 severe obesity due to excess calories with serious comorbidity and body mass index (BMI) of 50.0 to 59.9 in adult [facility])   Counseling patient daily on caloric restriction and regular physical activity.  Hypokalemia   Likely secondary to bronchodilator use earlier today  Patient is already been administered 40 miliequivalents of oral potassium by the emergency department  Will monitor potassium and magnesium levels with serial chemistries   Code Status:  Full code Family Communication: deferred   Status is: Inpatient  Remains inpatient appropriate because:IV treatments appropriate due to intensity of illness or inability to take PO and Inpatient level of care appropriate due to severity of  illness   Dispo: The patient is from: Home              Anticipated d/c is to: Home              Patient currently is not medically stable to d/c.   Difficult to place patient No        Marinda Elk MD [facility] Pager [phone]  If 7PM-7AM, please contact night-coverage [email or web address] Use universal [place] password for that web site. If you do not have the password, please call the hospital operator.  04/06/2021, 1:58 AM

## 2021-04-07 ENCOUNTER — Other Ambulatory Visit (HOSPITAL_COMMUNITY): Payer: Self-pay

## 2021-04-07 DIAGNOSIS — J4551 Severe persistent asthma with (acute) exacerbation: Principal | ICD-10-CM

## 2021-04-07 DIAGNOSIS — J9601 Acute respiratory failure with hypoxia: Secondary | ICD-10-CM | POA: Diagnosis not present

## 2021-04-07 DIAGNOSIS — Z6841 Body Mass Index (BMI) 40.0 and over, adult: Secondary | ICD-10-CM

## 2021-04-07 LAB — GLUCOSE, CAPILLARY
Glucose-Capillary: 105 mg/dL — ABNORMAL HIGH (ref 70–99)
Glucose-Capillary: 99 mg/dL (ref 70–99)

## 2021-04-07 MED ORDER — PREDNISONE 10 MG PO TABS
ORAL_TABLET | ORAL | 0 refills | Status: AC
  Filled 2021-04-07: qty 40, 14d supply, fill #0

## 2021-04-07 MED ORDER — METHYLPREDNISOLONE SODIUM SUCC 125 MG IJ SOLR
60.0000 mg | Freq: Two times a day (BID) | INTRAMUSCULAR | Status: DC
  Administered 2021-04-07: 60 mg via INTRAVENOUS
  Filled 2021-04-07: qty 2

## 2021-04-07 NOTE — Discharge Instructions (Signed)
Dietitian email: [email or web address] Dietitian phone: [phone]

## 2021-04-07 NOTE — Discharge Summary (Signed)
Physician Discharge Summary  Roxana Hiresyanna Cephas ZOX:[id number] DOB: 2001-08-17 DOA: 04/05/2021  PCP: Patient, No Pcp Per (Inactive)  Admit date: 04/05/2021 Discharge date: 04/07/2021  Admitted From: Home Disposition:  Home  Recommendations for Outpatient Follow-up:  1. Follow up with PCP in 2-3 weeks  Discharge Condition:Improved CODE STATUS:Full Diet recommendation: Diabetic   Brief/Interim Summary: 20yo who presents with asthma exacerbation. Pt was continued on steroids and neb tx  Discharge Diagnoses:  Principal Problem:   Severe persistent asthma with (acute) exacerbation Active Problems:   Hypokalemia   Type 2 diabetes mellitus without complication, without long-term current use of insulin (HCC)   Class 3 severe obesity due to excess calories with serious comorbidity and body mass index (BMI) of 50.0 to 59.9 in adult [facility](HCC)   Major depressive disorder   Bipolar 1 disorder (HCC)  Principal Problem: Severe persistent asthma with (acute) exacerbation   Patient presented with 1 day history of substantial shortness of breath and wheezing concerning for asthma exacerbation  [facility]  Pt noted to have both inspiratory and expiratory wheezing with decreased lung sounds throughout  O2 requirements were ultimately weaned to room air after pt was continued on high dose IV steroids with breathing treatments  Given long hx of asthma, will discharge home with extended course of po prednisone, continue home bronchodilators  Active Problems: Type 2 diabetes mellitus without complication, without long-term current use of insulin (HCC)   History of uncomplicated diabetes mellitus type 2 Per review of records in care everywhere  Continued on SSI coverage while in hospital  Hemoglobin A1c of 5.8  Generalized anxiety disorder, major depressive disorder and bipolar 1 disorder   Patient reports that she is no longer taking any of her  psychotropic medications prescribed to her during her December 2021 admission  Cont on PRN hydroxyzine for anxiety and trazodone for sleep  Class 3 severe obesity due to excess calories with serious comorbidity and body mass index (BMI) of 50.0 to 59.9 in adult [facility](HCC)   Recommend diet/lifestyle modification  Hypokalemia   Likely secondary to bronchodilator use in hospital  Corrected   Discharge Instructions   Allergies as of 04/07/2021      Reactions   Other Shortness Of Breath   Pt allergic to animals such as cats and dogs and pollen Reaction: sneezing, asthma trigger       Medication List    STOP taking these medications   nicotine 21 mg/24hr patch Commonly known as: NICODERM CQ - dosed in mg/24 hours     TAKE these medications   albuterol 108 (90 Base) MCG/ACT inhaler Commonly known as: VENTOLIN HFA Inhale 2 puffs into the lungs every 4 (four) hours as needed for wheezing or shortness of breath.   albuterol (2.5 MG/3ML) 0.083% nebulizer solution Commonly known as: PROVENTIL Take 3 mLs (2.5 mg total) by nebulization every 6 (six) hours as needed for wheezing or shortness of breath.   hydrOXYzine 25 MG tablet Commonly known as: ATARAX/VISTARIL Take 1 tablet (25 mg total) by mouth every 6 (six) hours as needed for anxiety.   lurasidone 40 MG Tabs tablet Commonly known as: LATUDA Take 1 tablet (40 mg total) by mouth daily with supper. For mood control   mometasone-formoterol 200-5 MCG/ACT Aero Commonly known as: DULERA Inhale 2 puffs into the lungs 2 (two) times daily.   predniSONE 10 MG tablet Commonly known as: DELTASONE Take 6 tablets (60 mg total) by mouth daily with breakfast for  3 days, THEN 4 tablets (40 mg total) daily with breakfast for 3 days, THEN 2 tablets (20 mg total) daily with breakfast for 3 days, THEN 1 tablet (10 mg total) daily with breakfast for 3 days, THEN 0.5 tablets (5 mg total) daily with breakfast for 2 days. Start taking on: Apr 07, 2021   sertraline 25 MG tablet Commonly known as: ZOLOFT Take 1 tablet (25 mg total) by mouth daily. For depression   traZODone 50 MG tablet Commonly known as: DESYREL Take 1 tablet (50 mg total) by mouth at bedtime as needed for sleep.       Follow-up Information    Follow up with your PCP in 2-3 weeks. Schedule an appointment as soon as possible for a visit.              Allergies  Allergen Reactions  . Other Shortness Of Breath    Pt allergic to animals such as cats and dogs and pollen Reaction: sneezing, asthma trigger       Procedures/Studies: DG Chest Portable 1 View  Result Date: 04/05/2021 CLINICAL DATA:  Shortness of breath EXAM: PORTABLE CHEST 1 VIEW COMPARISON:  10/09/2020 FINDINGS: The heart size and mediastinal contours are within normal limits. Both lungs are clear. The visualized skeletal structures are unremarkable. IMPRESSION: No active disease. Electronically Signed   By: Alcide Clever M.D.   On: 04/05/2021 18:45     Subjective: Feeling better today  Discharge Exam: Vitals:   04/07/21 0903 04/07/21 0941  BP:    Pulse:    Resp:    Temp:    SpO2: 100% 98%   Vitals:   04/07/21 0420 04/07/21 0748 04/07/21 0903 04/07/21 0941  BP: 135/73 123/89    Pulse: (!) 55 (!) 44    Resp: (!) 26 16    Temp:  97.7 F (36.5 C)    TempSrc:  Oral    SpO2: 98% (!) 84% 100% 98%  Weight:      Height:        General: Pt is alert, awake, not in acute distress Cardiovascular: RRR, S1/S2 +, no rubs, no gallops Respiratory: CTA bilaterally, trace residual end-expiratory wheezing, no rhonchi Abdominal: Soft, NT, ND, bowel sounds + Extremities: no edema, no cyanosis   The results of significant diagnostics from this hospitalization (including imaging, microbiology, ancillary and laboratory) are listed below for reference.     Microbiology: Recent Results (from the past 240 hour(s))  Resp Panel by RT-PCR (Flu A&B, Covid) Nasopharyngeal Swab     Status: None    Collection Time: 04/05/21  6:06 PM   Specimen: Nasopharyngeal Swab; Nasopharyngeal(NP) swabs in vial transport medium  Result Value Ref Range Status   SARS Coronavirus 2 by RT PCR NEGATIVE NEGATIVE Final    Comment: (NOTE) SARS-CoV-2 target nucleic acids are NOT DETECTED.  The SARS-CoV-2 RNA is generally detectable in upper respiratory specimens during the acute phase of infection. The lowest concentration of SARS-CoV-2 viral copies this assay can detect is 138 copies/mL. A negative result does not preclude SARS-Cov-2 infection and should not be used as the sole basis for treatment or other patient management decisions. A negative result may occur with  improper specimen collection/handling, submission of specimen other than nasopharyngeal swab, presence of viral mutation(s) within the areas targeted by this assay, and inadequate number of viral copies(<138 copies/mL). A negative result must be combined with clinical observations, patient history, and epidemiological information. The expected result is Negative.  Fact Sheet for Patients:  BloggerCourse.com  Fact Sheet for Healthcare Providers:  SeriousBroker.it  This test is no t yet approved or cleared by the [place] FDA and  has been authorized for detection and/or diagnosis of SARS-CoV-2 by FDA under an Emergency Use Authorization (EUA). This EUA will remain  in effect (meaning this test can be used) for the duration of the COVID-19 declaration under Section 564(b)(1) of the Act, 21 U.S.C.section 360bbb-3(b)(1), unless the authorization is terminated  or revoked sooner.       Influenza A by PCR NEGATIVE NEGATIVE Final   Influenza B by PCR NEGATIVE NEGATIVE Final    Comment: (NOTE) The Xpert Xpress SARS-CoV-2/FLU/RSV plus assay is intended as an aid in the diagnosis of influenza from Nasopharyngeal swab specimens and should not be used as a sole basis for treatment.  Nasal washings and aspirates are unacceptable for Xpert Xpress SARS-CoV-2/FLU/RSV testing.  Fact Sheet for Patients: BloggerCourse.com  Fact Sheet for Healthcare Providers: SeriousBroker.it  This test is not yet approved or cleared by the [place] FDA and has been authorized for detection and/or diagnosis of SARS-CoV-2 by FDA under an Emergency Use Authorization (EUA). This EUA will remain in effect (meaning this test can be used) for the duration of the COVID-19 declaration under Section 564(b)(1) of the Act, 21 U.S.C. section 360bbb-3(b)(1), unless the authorization is terminated or revoked.  Performed at [facility], [address] [place], [place] [address]      Labs: BNP (last 3 results) Recent Labs    04/05/21 1913  BNP 21.7   Basic Metabolic Panel: Recent Labs  Lab 04/05/21 1806 04/06/21 0158  NA 137 136  K 2.9* 4.0  CL 103 108  CO2 22 18*  GLUCOSE 101* 134*  BUN 9 8  CREATININE 0.70 0.66  CALCIUM 8.4* 8.9  MG  --  2.1   Liver Function Tests: Recent Labs  Lab 04/05/21 1806 04/06/21 0158  AST 17 17  ALT 13 12  ALKPHOS 69 66  BILITOT 0.5 0.8  PROT 7.1 6.8  ALBUMIN 3.8 3.6   No results for input(s): LIPASE, AMYLASE in the last 168 hours. No results for input(s): AMMONIA in the last 168 hours. CBC: Recent Labs  Lab 04/05/21 1806 04/06/21 0158  WBC 9.4 6.7  NEUTROABS 7.6 6.0  HGB 12.1 12.4  HCT 35.5* 36.4  MCV 92.7 94.1  PLT 265 254   Cardiac Enzymes: No results for input(s): CKTOTAL, CKMB, CKMBINDEX, TROPONINI in the last 168 hours. BNP: Invalid input(s): POCBNP CBG: Recent Labs  Lab 04/06/21 2057 04/07/21 0746 04/07/21 1129  GLUCAP 104* 105* 99   D-Dimer No results for input(s): DDIMER in the last 72 hours. Hgb A1c Recent Labs    04/06/21 0158  HGBA1C 5.0   Lipid Profile No results for input(s): CHOL, HDL, LDLCALC, TRIG, CHOLHDL, LDLDIRECT in the last 72  hours. Thyroid function studies No results for input(s): TSH, T4TOTAL, T3FREE, THYROIDAB in the last 72 hours.  Invalid input(s): FREET3 Anemia work up No results for input(s): VITAMINB12, FOLATE, FERRITIN, TIBC, IRON, RETICCTPCT in the last 72 hours. Urinalysis    Component Value Date/Time   COLORURINE YELLOW 01/24/2017 1140   APPEARANCEUR HAZY (A) 01/24/2017 1140   LABSPEC 1.025 01/24/2017 1140   PHURINE 5.0 01/24/2017 1140   GLUCOSEU NEGATIVE 01/24/2017 1140   HGBUR NEGATIVE 01/24/2017 1140   BILIRUBINUR NEGATIVE 01/24/2017 1140   KETONESUR NEGATIVE 01/24/2017 1140   PROTEINUR NEGATIVE 01/24/2017 1140   UROBILINOGEN 0.2 01/08/2013 2339  NITRITE NEGATIVE 01/24/2017 1140   LEUKOCYTESUR NEGATIVE 01/24/2017 1140   Sepsis Labs Invalid input(s): PROCALCITONIN,  WBC,  LACTICIDVEN Microbiology Recent Results (from the past 240 hour(s))  Resp Panel by RT-PCR (Flu A&B, Covid) Nasopharyngeal Swab     Status: None   Collection Time: 04/05/21  6:06 PM   Specimen: Nasopharyngeal Swab; Nasopharyngeal(NP) swabs in vial transport medium  Result Value Ref Range Status   SARS Coronavirus 2 by RT PCR NEGATIVE NEGATIVE Final    Comment: (NOTE) SARS-CoV-2 target nucleic acids are NOT DETECTED.  The SARS-CoV-2 RNA is generally detectable in upper respiratory specimens during the acute phase of infection. The lowest concentration of SARS-CoV-2 viral copies this assay can detect is 138 copies/mL. A negative result does not preclude SARS-Cov-2 infection and should not be used as the sole basis for treatment or other patient management decisions. A negative result may occur with  improper specimen collection/handling, submission of specimen other than nasopharyngeal swab, presence of viral mutation(s) within the areas targeted by this assay, and inadequate number of viral copies(<138 copies/mL). A negative result must be combined with clinical observations, patient history, and  epidemiological information. The expected result is Negative.  Fact Sheet for Patients:  BloggerCourse.com  Fact Sheet for Healthcare Providers:  SeriousBroker.it  This test is no t yet approved or cleared by the [place] FDA and  has been authorized for detection and/or diagnosis of SARS-CoV-2 by FDA under an Emergency Use Authorization (EUA). This EUA will remain  in effect (meaning this test can be used) for the duration of the COVID-19 declaration under Section 564(b)(1) of the Act, 21 U.S.C.section 360bbb-3(b)(1), unless the authorization is terminated  or revoked sooner.       Influenza A by PCR NEGATIVE NEGATIVE Final   Influenza B by PCR NEGATIVE NEGATIVE Final    Comment: (NOTE) The Xpert Xpress SARS-CoV-2/FLU/RSV plus assay is intended as an aid in the diagnosis of influenza from Nasopharyngeal swab specimens and should not be used as a sole basis for treatment. Nasal washings and aspirates are unacceptable for Xpert Xpress SARS-CoV-2/FLU/RSV testing.  Fact Sheet for Patients: BloggerCourse.com  Fact Sheet for Healthcare Providers: SeriousBroker.it  This test is not yet approved or cleared by the [place] FDA and has been authorized for detection and/or diagnosis of SARS-CoV-2 by FDA under an Emergency Use Authorization (EUA). This EUA will remain in effect (meaning this test can be used) for the duration of the COVID-19 declaration under Section 564(b)(1) of the Act, 21 U.S.C. section 360bbb-3(b)(1), unless the authorization is terminated or revoked.  Performed at [facility], [address], [place], [place] [address]    Time spent: 30 min  SIGNED:   Rickey Barbara, MD  [facility] 04/07/2021, 12:56 PM  If 7PM-7AM, please contact night-coverage

## 2021-04-07 NOTE — Progress Notes (Signed)
This nurse went over discharge instructions with patient. Patient explained home prednisone schedule back to this nurse and understands how to take it. Prednisone medication given to patient and all questions answered at this time. Patient called her mom while this nurse at bedside for transportation.

## 2021-04-07 NOTE — Plan of Care (Signed)
  Problem: Pain Managment: Goal: General experience of comfort will improve Outcome: Progressing   Problem: Safety: Goal: Ability to remain free from injury will improve Outcome: Progressing   Problem: Skin Integrity: Goal: Risk for impaired skin integrity will decrease Outcome: Progressing   

## 2021-06-03 ENCOUNTER — Other Ambulatory Visit: Payer: Self-pay

## 2021-06-03 ENCOUNTER — Emergency Department (HOSPITAL_BASED_OUTPATIENT_CLINIC_OR_DEPARTMENT_OTHER)
Admission: EM | Admit: 2021-06-03 | Discharge: 2021-06-03 | Disposition: A | Discharge status: Home / Self Care | Payer: Medicaid Other | Attending: Emergency Medicine | Admitting: Emergency Medicine

## 2021-06-03 ENCOUNTER — Emergency Department (HOSPITAL_BASED_OUTPATIENT_CLINIC_OR_DEPARTMENT_OTHER): Payer: Medicaid Other

## 2021-06-03 ENCOUNTER — Encounter (HOSPITAL_BASED_OUTPATIENT_CLINIC_OR_DEPARTMENT_OTHER): Payer: Self-pay | Admitting: *Deleted

## 2021-06-03 DIAGNOSIS — F17210 Nicotine dependence, cigarettes, uncomplicated: Secondary | ICD-10-CM | POA: Diagnosis not present

## 2021-06-03 DIAGNOSIS — E119 Type 2 diabetes mellitus without complications: Secondary | ICD-10-CM | POA: Insufficient documentation

## 2021-06-03 DIAGNOSIS — R0602 Shortness of breath: Secondary | ICD-10-CM | POA: Diagnosis present

## 2021-06-03 DIAGNOSIS — J45901 Unspecified asthma with (acute) exacerbation: Secondary | ICD-10-CM | POA: Insufficient documentation

## 2021-06-03 MED ORDER — METHYLPREDNISOLONE SODIUM SUCC 125 MG IJ SOLR
125.0000 mg | Freq: Once | INTRAMUSCULAR | Status: AC
  Administered 2021-06-03: 125 mg via INTRAVENOUS
  Filled 2021-06-03: qty 2

## 2021-06-03 MED ORDER — ALBUTEROL SULFATE (2.5 MG/3ML) 0.083% IN NEBU: 2.5000 mg | INHALATION_SOLUTION | Freq: Four times a day (QID) | RESPIRATORY_TRACT | 0 refills | Status: DC | PRN

## 2021-06-03 MED ORDER — MAGNESIUM SULFATE 50 % IJ SOLN
1.0000 g | Freq: Once | INTRAMUSCULAR | Status: AC
  Administered 2021-06-03: 1 g via INTRAVENOUS
  Filled 2021-06-03: qty 2

## 2021-06-03 MED ORDER — PREDNISONE 20 MG PO TABS: 60.0000 mg | ORAL_TABLET | Freq: Every day | ORAL | 0 refills | Status: AC

## 2021-06-03 MED ORDER — ALBUTEROL SULFATE HFA 108 (90 BASE) MCG/ACT IN AERS
2.0000 | INHALATION_SPRAY | Freq: Once | RESPIRATORY_TRACT | Status: AC
  Administered 2021-06-03: 2 via RESPIRATORY_TRACT
  Filled 2021-06-03: qty 6.7

## 2021-06-03 MED ORDER — SODIUM CHLORIDE 0.9 % IV BOLUS
500.0000 mL | Freq: Once | INTRAVENOUS | Status: AC
  Administered 2021-06-03: 500 mL via INTRAVENOUS

## 2021-06-03 NOTE — ED Provider Notes (Signed)
[facility] EMERGENCY DEPARTMENT Provider Note   CSN: [id number] Arrival date & time: 06/03/21  2151     History Chief Complaint  Patient presents with   Shortness of Breath    Whitney Beard is a 20 y.o. female.  The history is provided by the patient.  Shortness of Breath Severity:  Mild Onset quality:  Gradual Duration:  1 day Timing:  Constant Progression:  Unchanged Chronicity:  Recurrent Context comment:  Asthma symptoms today, did not have inhaler. Got breathing treatments with EMS and feeeling much better, no fever Relieved by:  Nothing Worsened by:  Nothing Associated symptoms: cough and wheezing   Associated symptoms: no abdominal pain, no chest pain, no claudication, no diaphoresis, no ear pain, no fever, no headaches, no hemoptysis, no rash, no sore throat, no sputum production, no syncope, no swollen glands and no vomiting       Past Medical History:  Diagnosis Date   ADHD (attention deficit hyperactivity disorder)    Allergic rhinitis    Asthma    Bipolar 1 disorder (HCC)    Diabetes mellitus without complication (HCC)    states today 11/11/20 "was told in the past that she was pre- diabetic"   Obesity    Sleep apnea    Suicide attempt by drug ingestion [facility])     Patient Active Problem List   Diagnosis Date Noted   Bipolar 1 disorder (HCC) 11/11/2020   Severe bipolar I disorder, current or most recent episode depressed (HCC) 11/11/2020   Anxiety disorder, unspecified 11/11/2020   Cannabis use disorder, mild, abuse 11/11/2020   Alcohol use 11/11/2020   Borderline personality disorder (HCC) 11/11/2020   Severe persistent asthma with (acute) exacerbation 10/10/2020   Major depressive disorder 06/28/2020   Anxiety    Asthma 05/17/2019   Diabetes mellitus type 2 in obese (HCC) 03/09/2019   Suicide attempt (HCC)    Hypokalemia 12/26/2018   Type 2 diabetes mellitus without complication, without long-term current use of insulin (HCC) 12/26/2018    OSA (obstructive sleep apnea) 12/26/2018   Class 3 severe obesity due to excess calories with serious comorbidity and body mass index (BMI) of 50.0 to 59.9 in adult [facility]) 12/26/2018   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     Family History  Problem Relation Age of Onset   Bipolar disorder Father    Schizophrenia Father    Liver disease Father    Asthma Father    Asthma Maternal Grandmother    Schizophrenia Maternal Grandmother    SIDS Maternal Aunt    Heart disease Paternal Grandmother    Diabetes Paternal Grandmother    Seizures Paternal Grandmother     Social History   Tobacco Use   Smoking status: Every Day    Packs/day: 0.50    Pack years: 0.00    Types: Cigarettes   Smokeless tobacco: Never   Tobacco comments:    vapes   Vaping Use   Vaping Use: Every day   Substances: Nicotine  Substance Use Topics   Alcohol use: Yes    Comment: 1-3 times a week    Drug use: Yes    Types: Marijuana    Home Medications Prior to Admission medications   Medication Sig Start Date End Date Taking? Authorizing Provider  predniSONE (DELTASONE) 20 MG  tablet Take 3 tablets (60 mg total) by mouth daily for 4 days. 06/03/21 06/07/21 Yes Garett Tetzloff, DO  albuterol (PROVENTIL) (2.5 MG/3ML) 0.083% nebulizer solution Take 3 mLs (2.5 mg total) by nebulization every 6 (six) hours as needed for wheezing or shortness of breath. 06/03/21   Amayrani Bennick, DO  albuterol (VENTOLIN HFA) 108 (90 Base) MCG/ACT inhaler Inhale 2 puffs into the lungs every 4 (four) hours as needed for wheezing or shortness of breath. 10/12/20   [name], DO  hydrOXYzine (ATARAX/VISTARIL) 25 MG tablet Take 1 tablet (25 mg total) by mouth every 6 (six) hours as needed for anxiety. 11/15/20   Armandina Stammer I, NP  lurasidone (LATUDA) 40 MG TABS tablet Take 1 tablet (40 mg  total) by mouth daily with supper. For mood control 11/15/20   Nwoko, Nicole Kindred I, NP  mometasone-formoterol (DULERA) 200-5 MCG/ACT AERO Inhale 2 puffs into the lungs 2 (two) times daily. Patient not taking: Reported on 04/06/2021 10/12/20 11/11/20  [name], DO  sertraline (ZOLOFT) 25 MG tablet Take 1 tablet (25 mg total) by mouth daily. For depression 11/15/20   Armandina Stammer I, NP  traZODone (DESYREL) 50 MG tablet Take 1 tablet (50 mg total) by mouth at bedtime as needed for sleep. 11/15/20   Sanjuana Kava, NP    Allergies    Other  Review of Systems   Review of Systems  Constitutional:  Negative for chills, diaphoresis and fever.  HENT:  Negative for ear pain and sore throat.   Eyes:  Negative for pain and visual disturbance.  Respiratory:  Positive for cough, shortness of breath and wheezing. Negative for hemoptysis and sputum production.   Cardiovascular:  Negative for chest pain, palpitations, claudication and syncope.  Gastrointestinal:  Negative for abdominal pain and vomiting.  Genitourinary:  Negative for dysuria and hematuria.  Musculoskeletal:  Negative for arthralgias and back pain.  Skin:  Negative for color change and rash.  Neurological:  Negative for seizures, syncope and headaches.  All other systems reviewed and are negative.  Physical Exam Updated Vital Signs BP 137/73   Pulse (!) 123   Temp 98.9 F (37.2 C)   Resp 16   Ht 5\' 6"  (1.676 m)   Wt (!) 147.4 kg   LMP 05/22/2021   SpO2 100% Comment: neb  BMI 52.46 kg/m   Physical Exam Vitals and nursing note reviewed.  Constitutional:      General: She is not in acute distress.    Appearance: She is well-developed. She is not ill-appearing.  HENT:     Head: Normocephalic and atraumatic.     Mouth/Throat:     Mouth: Mucous membranes are moist.  Eyes:     Extraocular Movements: Extraocular movements intact.     Conjunctiva/sclera: Conjunctivae normal.     Pupils: Pupils are equal, round, and reactive to  light.  Cardiovascular:     Rate and Rhythm: Normal rate and regular rhythm.     Pulses: Normal pulses.     Heart sounds: Normal heart sounds. No murmur heard. Pulmonary:     Effort: Pulmonary effort is normal. No tachypnea or respiratory distress.     Breath sounds: Wheezing present. No decreased breath sounds.  Abdominal:     Palpations: Abdomen is soft.     Tenderness: There is no abdominal tenderness.  Musculoskeletal:        General: Normal range of motion.     Cervical back: Normal range of motion and neck supple.  Right lower leg: No edema.     Left lower leg: No edema.  Skin:    General: Skin is warm and dry.     Capillary Refill: Capillary refill takes less than 2 seconds.  Neurological:     General: No focal deficit present.     Mental Status: She is alert.  Psychiatric:        Mood and Affect: Mood normal.    ED Results / Procedures / Treatments   Labs (all labs ordered are listed, but only abnormal results are displayed) Labs Reviewed - No data to display  EKG None  Radiology DG Chest Portable 1 View  Result Date: 06/03/2021 CLINICAL DATA:  Wheezing.  Asthma. EXAM: PORTABLE CHEST 1 VIEW COMPARISON:  04/05/2021 FINDINGS: The cardiomediastinal contours are normal. Bronchial thickening. Pulmonary vasculature is normal. No consolidation, pleural effusion, or pneumothorax. No acute osseous abnormalities are seen. IMPRESSION: Bronchial thickening consistent with asthma. Electronically Signed   By: Narda Rutherford M.D.   On: 06/03/2021 22:22    Procedures Procedures   Medications Ordered in ED Medications  albuterol (VENTOLIN HFA) 108 (90 Base) MCG/ACT inhaler 2 puff (has no administration in time range)  methylPREDNISolone sodium succinate (SOLU-MEDROL) 125 mg/2 mL injection 125 mg (125 mg Intravenous Given 06/03/21 2219)  magnesium sulfate (IV Push/IM) injection 1 g (1 g Intravenous Given 06/03/21 2225)  sodium chloride 0.9 % bolus 500 mL (500 mLs Intravenous New  Bag/Given 06/03/21 2206)    ED Course  I have reviewed the triage vital signs and the nursing notes.  Pertinent labs & imaging results that were available during my care of the patient were reviewed by me and considered in my medical decision making (see chart for details).    MDM Rules/Calculators/A&P                          Waunetta Riggle is here with asthma exacerbation.  Patient with overall unremarkable vitals except for mild tachycardia.  Breathing treatment given with EMS with great improvement.  No signs of respiratory distress.  Wheezing but overall good air movement.  Chest x-ray showed no evidence of pneumonia.  No fever.  States that she did not have her albuterol inhaler at home.  Will give small fluid bolus, IV methylprednisolone, IV magnesium to help with further symptom management.  We will make sure she has an albuterol inhaler for home.  We will reevaluate.  Patient reevaluated after she finished her duo nebs and fluids and magnesium and feeling much better.  There has been no rebound bronchospasms.  Given albuterol inhaler will prescribe prednisone.  Discharged in good condition.  Understands return precautions.  This chart was dictated using voice recognition software.  Despite best efforts to proofread,  errors can occur which can change the documentation meaning.   Final Clinical Impression(s) / ED Diagnoses Final diagnoses:  Mild asthma with exacerbation, unspecified whether persistent    Rx / DC Orders ED Discharge Orders          Ordered    predniSONE (DELTASONE) 20 MG tablet  Daily        06/03/21 2233    albuterol (PROVENTIL) (2.5 MG/3ML) 0.083% nebulizer solution  Every 6 hours PRN        06/03/21 2234             Virgina Norfolk, DO 06/03/21 2235

## 2021-06-03 NOTE — Discharge Instructions (Addendum)
Recommend taking 4 puffs of albuterol every 4 hours for the next 24 hours and then as needed.  Take antibiotics as prescribed.  Take your next dose antibiotic tomorrow afternoon.

## 2021-06-03 NOTE — ED Triage Notes (Signed)
Brought in by EMS form home c/o Asthma attack  with SOb , duoneb x 1 given and albuteral neb x 1 given. PTA

## 2021-12-01 ENCOUNTER — Encounter (HOSPITAL_BASED_OUTPATIENT_CLINIC_OR_DEPARTMENT_OTHER): Payer: Self-pay | Admitting: Emergency Medicine

## 2021-12-01 ENCOUNTER — Emergency Department (HOSPITAL_BASED_OUTPATIENT_CLINIC_OR_DEPARTMENT_OTHER)
Admission: EM | Admit: 2021-12-01 | Discharge: 2021-12-01 | Disposition: A | Discharge status: Home / Self Care | Payer: Medicaid Other | Attending: Emergency Medicine | Admitting: Emergency Medicine

## 2021-12-01 ENCOUNTER — Other Ambulatory Visit: Payer: Self-pay

## 2021-12-01 ENCOUNTER — Emergency Department (HOSPITAL_BASED_OUTPATIENT_CLINIC_OR_DEPARTMENT_OTHER): Payer: Medicaid Other

## 2021-12-01 DIAGNOSIS — M25462 Effusion, left knee: Secondary | ICD-10-CM | POA: Insufficient documentation

## 2021-12-01 DIAGNOSIS — M25562 Pain in left knee: Secondary | ICD-10-CM | POA: Insufficient documentation

## 2021-12-01 DIAGNOSIS — W010XXA Fall on same level from slipping, tripping and stumbling without subsequent striking against object, initial encounter: Secondary | ICD-10-CM | POA: Diagnosis not present

## 2021-12-01 DIAGNOSIS — S8992XA Unspecified injury of left lower leg, initial encounter: Secondary | ICD-10-CM | POA: Insufficient documentation

## 2021-12-01 NOTE — Discharge Instructions (Addendum)
You were seen in the emergency department today for knee pain.  As we discussed your x-ray was negative for any fractures or dislocations.  It is likely that you damaged some of the soft tissue in your knee.  This is best diagnosed with an MRI.  We have placed your knee in an immobilizer and given you crutches to use when you are walking.  You can ice the area, and use ibuprofen or Tylenol as needed for pain.  I have attached the contact information for the orthopedic doctor, please call and get you an appointment for later this week if possible.  Continue to monitor how you're doing and return to the ER for new or worsening symptoms such as numbness, tingling, weakness.   It has been a pleasure seeing and caring for you today and I hope you start feeling better soon!

## 2021-12-01 NOTE — ED Provider Notes (Signed)
[facility] EMERGENCY DEPARTMENT Provider Note   CSN: [id number] Arrival date & time: 12/01/21  1957     History  Chief Complaint  Patient presents with   Leg Pain    Whitney Beard is a 21 y.o. female with no significant past medical history presents emergency department complaining of left knee pain.  Patient states that 1 day ago she was running, and tripped and felt that she twisted her left knee.  She reports hearing a "pop".  Since then she has had pain worse with bending her knee, and weightbearing.  She has not tried anything at home for her symptoms.   Leg Pain     Home Medications Prior to Admission medications   Medication Sig Start Date End Date Taking? Authorizing Provider  albuterol (PROVENTIL) (2.5 MG/3ML) 0.083% nebulizer solution Take 3 mLs (2.5 mg total) by nebulization every 6 (six) hours as needed for wheezing or shortness of breath. 06/03/21   Curatolo, Adam, DO  albuterol (VENTOLIN HFA) 108 (90 Base) MCG/ACT inhaler Inhale 2 puffs into the lungs every 4 (four) hours as needed for wheezing or shortness of breath. 10/12/20   [name], DO  hydrOXYzine (ATARAX/VISTARIL) 25 MG tablet Take 1 tablet (25 mg total) by mouth every 6 (six) hours as needed for anxiety. 11/15/20   Armandina Stammer I, NP  lurasidone (LATUDA) 40 MG TABS tablet Take 1 tablet (40 mg total) by mouth daily with supper. For mood control 11/15/20   Nwoko, Nicole Kindred I, NP  mometasone-formoterol (DULERA) 200-5 MCG/ACT AERO Inhale 2 puffs into the lungs 2 (two) times daily. Patient not taking: Reported on 04/06/2021 10/12/20 11/11/20  [name], DO  sertraline (ZOLOFT) 25 MG tablet Take 1 tablet (25 mg total) by mouth daily. For depression 11/15/20   Armandina Stammer I, NP  traZODone (DESYREL) 50 MG tablet Take 1 tablet (50 mg total) by mouth at bedtime as needed for sleep. 11/15/20   Sanjuana Kava, NP      Allergies    Other    Review of Systems   Review of Systems  Musculoskeletal:         Left knee pain  Neurological:  Negative for weakness and numbness.  All other systems reviewed and are negative.  Physical Exam Updated Vital Signs BP 128/86 (BP Location: Left Arm)    Pulse 91    Temp 98.2 F (36.8 C) (Oral)    Resp 16    Ht 5\' 6"  (1.676 m)    Wt 136.1 kg    LMP 11/26/2021    SpO2 96%    BMI 48.42 kg/m  Physical Exam Vitals and nursing note reviewed.  Constitutional:      Appearance: Normal appearance.  HENT:     Head: Normocephalic and atraumatic.  Eyes:     Conjunctiva/sclera: Conjunctivae normal.  Pulmonary:     Effort: Pulmonary effort is normal. No respiratory distress.  Musculoskeletal:     Comments: Diffuse swelling of the left knee, with mild effusion.  No palpable deformities, no laxity.  Decreased passive flexion due to pain.  Pulses normal and equal in bilateral lower extremities, and sensation intact.   Skin:    General: Skin is warm and dry.  Neurological:     Mental Status: She is alert.  Psychiatric:        Mood and Affect: Mood normal.        Behavior: Behavior normal.    ED Results / Procedures / Treatments  Labs (all labs ordered are listed, but only abnormal results are displayed) Labs Reviewed - No data to display  EKG None  Radiology DG Knee Complete 4 Views Left  Result Date: 12/01/2021 CLINICAL DATA:  Larey Seat, pain EXAM: LEFT KNEE - COMPLETE 4+ VIEW COMPARISON:  None. FINDINGS: Frontal, bilateral oblique, lateral views of the left knee are obtained. No fracture, subluxation, or dislocation. Joint spaces are well preserved. Moderate joint effusion. Soft tissues are otherwise unremarkable. IMPRESSION: 1. Moderate joint effusion.  No acute fracture. Electronically Signed   By: Sharlet Salina M.D.   On: 12/01/2021 20:34    Procedures Procedures    Medications Ordered in ED Medications - No data to display  ED Course/ Medical Decision Making/ A&P                           Medical Decision Making Patient presents to the emergency  department with 1 day of left knee pain after a fall.  Patient is neurovascularly intact in her bilateral lower extremities.  X-ray performed showed no acute fractures or dislocations.  Patient has no laxity on my exam, does have a moderate joint effusion.  Discussed with patient the possibility of soft tissue injury, to include ligaments or meniscus.  Discussed that this is best diagnosed with an MRI, but I do not have this at this location.  Place patient in knee immobilizer.  Recommended RICE method, and follow-up with orthopedics later this week.  Provided this contact information for the patient.  Overall patient is not requiring admission or inpatient treatment for her symptoms, and she stable for discharge.  Discussed reasons to return to the emergency department, patient is agreeable to plan.  Problems Addressed: Injury of left knee, initial encounter: acute illness or injury  Amount and/or Complexity of Data Reviewed Radiology: ordered and independent interpretation performed. Decision-making details documented in ED Course.    Details: X-ray of the left knee showed no acute fracture dislocation, with moderate joint effusion.   Final Clinical Impression(s) / ED Diagnoses Final diagnoses:  Injury of left knee, initial encounter    Rx / DC Orders ED Discharge Orders     None      Portions of this report may have been transcribed using voice recognition software. Every effort was made to ensure accuracy; however, inadvertent computerized transcription errors may be present.    Jeanella Flattery 12/01/21 2202    Maia Plan, MD 12/07/21 1737

## 2021-12-01 NOTE — ED Notes (Signed)
Triaged by Sukhdeep Wieting RN

## 2021-12-01 NOTE — ED Triage Notes (Signed)
Brought in by EMS from home C/o left  leg pain , fall x 1 day ago

## 2022-03-02 ENCOUNTER — Inpatient Hospital Stay (HOSPITAL_COMMUNITY)
Admission: EM | Admit: 2022-03-02 | Discharge: 2022-03-05 | DRG: 202 | Disposition: A | Discharge status: Home / Self Care | Payer: Medicaid Other | Attending: Internal Medicine | Admitting: Internal Medicine

## 2022-03-02 ENCOUNTER — Encounter (HOSPITAL_COMMUNITY): Payer: Self-pay

## 2022-03-02 ENCOUNTER — Other Ambulatory Visit: Payer: Self-pay

## 2022-03-02 DIAGNOSIS — Z818 Family history of other mental and behavioral disorders: Secondary | ICD-10-CM

## 2022-03-02 DIAGNOSIS — F319 Bipolar disorder, unspecified: Secondary | ICD-10-CM | POA: Diagnosis present

## 2022-03-02 DIAGNOSIS — J4551 Severe persistent asthma with (acute) exacerbation: Principal | ICD-10-CM | POA: Diagnosis present

## 2022-03-02 DIAGNOSIS — E66813 Obesity, class 3: Secondary | ICD-10-CM

## 2022-03-02 DIAGNOSIS — Z825 Family history of asthma and other chronic lower respiratory diseases: Secondary | ICD-10-CM

## 2022-03-02 DIAGNOSIS — F419 Anxiety disorder, unspecified: Secondary | ICD-10-CM | POA: Diagnosis present

## 2022-03-02 DIAGNOSIS — J45901 Unspecified asthma with (acute) exacerbation: Secondary | ICD-10-CM | POA: Diagnosis present

## 2022-03-02 DIAGNOSIS — Z9151 Personal history of suicidal behavior: Secondary | ICD-10-CM

## 2022-03-02 DIAGNOSIS — Z6841 Body Mass Index (BMI) 40.0 and over, adult: Secondary | ICD-10-CM

## 2022-03-02 DIAGNOSIS — F17210 Nicotine dependence, cigarettes, uncomplicated: Secondary | ICD-10-CM | POA: Diagnosis present

## 2022-03-02 DIAGNOSIS — F909 Attention-deficit hyperactivity disorder, unspecified type: Secondary | ICD-10-CM | POA: Diagnosis present

## 2022-03-02 DIAGNOSIS — Z79899 Other long term (current) drug therapy: Secondary | ICD-10-CM

## 2022-03-02 DIAGNOSIS — F603 Borderline personality disorder: Secondary | ICD-10-CM | POA: Diagnosis present

## 2022-03-02 DIAGNOSIS — J4541 Moderate persistent asthma with (acute) exacerbation: Principal | ICD-10-CM

## 2022-03-02 DIAGNOSIS — Z20822 Contact with and (suspected) exposure to covid-19: Secondary | ICD-10-CM | POA: Diagnosis present

## 2022-03-02 DIAGNOSIS — J309 Allergic rhinitis, unspecified: Secondary | ICD-10-CM | POA: Diagnosis present

## 2022-03-02 DIAGNOSIS — E119 Type 2 diabetes mellitus without complications: Secondary | ICD-10-CM

## 2022-03-02 DIAGNOSIS — J4550 Severe persistent asthma, uncomplicated: Secondary | ICD-10-CM

## 2022-03-02 DIAGNOSIS — G4730 Sleep apnea, unspecified: Secondary | ICD-10-CM | POA: Diagnosis present

## 2022-03-02 HISTORY — DX: Suicide attempt, initial encounter: T14.91XA

## 2022-03-02 MED ORDER — ALBUTEROL SULFATE (2.5 MG/3ML) 0.083% IN NEBU
15.0000 mg/h | INHALATION_SOLUTION | Freq: Once | RESPIRATORY_TRACT | Status: AC
  Administered 2022-03-02: 15 mg/h via RESPIRATORY_TRACT
  Filled 2022-03-02: qty 18

## 2022-03-02 MED ORDER — IPRATROPIUM BROMIDE 0.02 % IN SOLN
0.5000 mg | Freq: Once | RESPIRATORY_TRACT | Status: AC
  Administered 2022-03-02: 0.5 mg via RESPIRATORY_TRACT
  Filled 2022-03-02: qty 2.5

## 2022-03-02 NOTE — ED Triage Notes (Signed)
Complaining of shortness of breath that started this evening, has had a total of 10 mg of albuterol and 1 atrovent per ems, also solumedrol 125mg  iv.  ?

## 2022-03-02 NOTE — ED Provider Notes (Signed)
?[facility] ?Provider Note ? ? ?CSN: [id number] ?Arrival date & time: 03/02/22  2220 ? ?  ? ?History ? ?Chief Complaint  ?Patient presents with  ? Shortness of Breath  ? ? ?Whitney Beard is a 21 y.o. female. ? ?HPI ? ?  ?This is a 21 year old female with a history of asthma who presents with shortness of breath.  Patient reports that she woke up this morning with increasing shortness of breath.  She has been using her breathing treatments at home with minimal relief.  She went to work and had to leave early.  She denies any chest pain.  She states she has multiple triggers for her asthma.  Last exacerbation was approximately 6 months ago when she took steroids.  She has had to be admitted to the hospital in the past.  No history of ICU admissions.  Denies any recent fevers or cough.  Patient was brought in by EMS.  She was given albuterol, Atrovent, and Solu-Medrol in route. ? ?Chart reviewed.  Patient had an admission in May 2022 and November 2021 for asthma ?Home Medications ?Prior to Admission medications   ?Medication Sig Start Date End Date Taking? Authorizing Provider  ?albuterol (PROVENTIL) (2.5 MG/3ML) 0.083% nebulizer solution Take 3 mLs (2.5 mg total) by nebulization every 6 (six) hours as needed for wheezing or shortness of breath. 06/03/21   Curatolo, Adam, DO  ?albuterol (VENTOLIN HFA) 108 (90 Base) MCG/ACT inhaler Inhale 2 puffs into the lungs every 4 (four) hours as needed for wheezing or shortness of breath. 10/12/20   Kayleen Memos, DO  ?hydrOXYzine (ATARAX/VISTARIL) 25 MG tablet Take 1 tablet (25 mg total) by mouth every 6 (six) hours as needed for anxiety. 11/15/20   Lindell Spar I, NP  ?lurasidone (LATUDA) 40 MG TABS tablet Take 1 tablet (40 mg total) by mouth daily with supper. For mood control 11/15/20   Lindell Spar I, NP  ?mometasone-formoterol (DULERA) 200-5 MCG/ACT AERO Inhale 2 puffs into the lungs 2 (two) times daily. ?Patient not taking: Reported on  04/06/2021 10/12/20 11/11/20  Kayleen Memos, DO  ?sertraline (ZOLOFT) 25 MG tablet Take 1 tablet (25 mg total) by mouth daily. For depression 11/15/20   Lindell Spar I, NP  ?traZODone (DESYREL) 50 MG tablet Take 1 tablet (50 mg total) by mouth at bedtime as needed for sleep. 11/15/20   Encarnacion Slates, NP  ?   ? ?Allergies    ?Other   ? ?Review of Systems   ?Review of Systems  ?Constitutional:  Negative for fever.  ?Respiratory:  Positive for shortness of breath. Negative for cough.   ?Cardiovascular:  Negative for chest pain.  ?All other systems reviewed and are negative. ? ?Physical Exam ?Updated Vital Signs ?BP (!) 115/55   Pulse (!) 102   Temp 98.3 ?F (36.8 ?C) (Oral)   Resp 18   Ht 1.676 m (5\' 6" )   Wt (!) 137 kg   SpO2 99%   BMI 48.74 kg/m?  ?Physical Exam ?Vitals and nursing note reviewed.  ?Constitutional:   ?   Appearance: She is well-developed. She is obese.  ?HENT:  ?   Head: Normocephalic and atraumatic.  ?   Mouth/Throat:  ?   Mouth: Mucous membranes are moist.  ?Eyes:  ?   Pupils: Pupils are equal, round, and reactive to light.  ?Cardiovascular:  ?   Rate and Rhythm: Regular rhythm. Tachycardia present.  ?   Heart sounds: Normal heart sounds.  ?  Pulmonary:  ?   Effort: Pulmonary effort is normal. No respiratory distress.  ?   Breath sounds: Wheezing present.  ?   Comments: Tachypneic but able to provide history, speaking in short sentences, fair air movement with diffuse expiratory wheezing in all lung fields ?Abdominal:  ?   General: Bowel sounds are normal.  ?   Palpations: Abdomen is soft.  ?Musculoskeletal:  ?   Cervical back: Neck supple.  ?   Right lower leg: No edema.  ?   Left lower leg: No edema.  ?Skin: ?   General: Skin is warm and dry.  ?Neurological:  ?   Mental Status: She is alert and oriented to person, place, and time.  ?Psychiatric:     ?   Mood and Affect: Mood normal.  ? ? ?ED Results / Procedures / Treatments   ?Labs ?(all labs ordered are listed, but only abnormal results are  displayed) ?Labs Reviewed  ?CBC WITH DIFFERENTIAL/PLATELET - Abnormal; Notable for the following components:  ?    Result Value  ? WBC 13.5 (*)   ? Neutro Abs 11.6 (*)   ? All other components within normal limits  ?BASIC METABOLIC PANEL - Abnormal; Notable for the following components:  ? CO2 21 (*)   ? Glucose, Bld 101 (*)   ? Calcium 8.8 (*)   ? All other components within normal limits  ? ? ?EKG ?EKG Interpretation ? ?Date/Time:  Monday March 02 2022 22:26:49 EDT ?Ventricular Rate:  110 ?PR Interval:  140 ?QRS Duration: 88 ?QT Interval:  340 ?QTC Calculation: 460 ?R Axis:   86 ?Text Interpretation: Sinus tachycardia Nonspecific T wave abnormality Abnormal ECG When compared with ECG of 05-Apr-2021 18:45, PREVIOUS ECG IS PRESENT Confirmed by Elnora Morrison [phone]) on 03/02/2022 10:32:03 PM ? ?Radiology ?No results found. ? ?Procedures ?Mar[facility] KitchenCritical Care ?Performed by: Merryl Hacker, MD ?Authorized by: Merryl Hacker, MD  ? ?Critical care provider statement:  ?  Critical care time (minutes):  45 ?  Critical care was necessary to treat or prevent imminent or life-threatening deterioration of the following conditions:  Respiratory failure ?  Critical care was time spent personally by me on the following activities:  Development of treatment plan with patient or surrogate, discussions with consultants, evaluation of patient's response to treatment, examination of patient, ordering and review of laboratory studies, ordering and review of radiographic studies, ordering and performing treatments and interventions, pulse oximetry, re-evaluation of patient's condition and review of old charts  ? ? ?Medications Ordered in ED ?Medications  ?magnesium sulfate IVPB 2 g 50 mL (has no administration in time range)  ?ipratropium-albuterol (DUONEB) 0.5-2.5 (3) MG/3ML nebulizer solution 3 mL (has no administration in time range)  ?albuterol (PROVENTIL) (2.5 MG/3ML) 0.083% nebulizer solution (15 mg/hr Nebulization Given 03/02/22  2324)  ?ipratropium (ATROVENT) nebulizer solution 0.5 mg (0.5 mg Nebulization Given 03/02/22 2324)  ?LORazepam (ATIVAN) injection 0.5 mg (0.5 mg Intravenous Given 03/03/22 0154)  ? ? ?ED Course/ Medical Decision Making/ A&P ?Clinical Course as of 03/03/22 0209  ?Tue Mar 03, 2022  ?0135 On reassessment, patient very jittery.  No respiratory distress.  Slightly improved air movement but continues to have diffuse expiratory wheezing.  Appears very anxious.  Will have respiratory reassess. [CH]  ?0136 Repeat DuoNeb ordered.  Feel patient likely will need multiple treatments.  Have also ordered IV magnesium. [CH]  ?  ?Clinical Course User Index ?[CH] Verle Brillhart, Barbette Hair, MD  ? ?                        ?  Medical Decision Making ?Amount and/or Complexity of Data Reviewed ?Labs: ordered. ? ?Risk ?Prescription drug management. ?Decision regarding hospitalization. ? ? ?This patient presents to the ED for concern of shortness of breath, this involves an extensive number of treatment options, and is a complaint that carries with it a high risk of complications and morbidity.  The differential diagnosis includes asthma exacerbation, viral illness, pneumonia, pneumothorax ? ?MDM:   ? ?This is a 21 year old female with known history of asthma who presents with shortness of breath.  She was given DuoNeb and Solu-Medrol by EMS.  On my evaluation, she is tachycardic and mildly tachypneic.  She is wheezing in all lung fields.  History of asthma requiring hospital admission.  She denies infectious symptoms including fever or cough.  Given that she is diffusely wheezy, have lower suspicion for pneumonia.  We will hold off on x-ray imaging at this time.  She is reporting some cramping.  Will obtain some basic lab work.  Labs notable for calcium of 8.8 but otherwise reassuring.  Patient was given a continuous DuoNeb.  See clinical course above.  Patient had persistent wheezing despite continuous DuoNeb.  She is very jittery.  She was given a  dose of Ativan and IV magnesium.  Will order repeat DuoNeb.  Feel she will likely need multiple treatments for acute exacerbation.  We will plan for admission to the hospital. ?(Labs, imaging) ? ?Labs: ?I Ordered, a

## 2022-03-03 ENCOUNTER — Encounter (HOSPITAL_COMMUNITY): Payer: Self-pay | Admitting: Internal Medicine

## 2022-03-03 ENCOUNTER — Observation Stay (HOSPITAL_COMMUNITY): Payer: Medicaid Other

## 2022-03-03 DIAGNOSIS — F17210 Nicotine dependence, cigarettes, uncomplicated: Secondary | ICD-10-CM | POA: Diagnosis present

## 2022-03-03 DIAGNOSIS — F603 Borderline personality disorder: Secondary | ICD-10-CM

## 2022-03-03 DIAGNOSIS — F909 Attention-deficit hyperactivity disorder, unspecified type: Secondary | ICD-10-CM | POA: Diagnosis present

## 2022-03-03 DIAGNOSIS — G4730 Sleep apnea, unspecified: Secondary | ICD-10-CM | POA: Diagnosis present

## 2022-03-03 DIAGNOSIS — Z6841 Body Mass Index (BMI) 40.0 and over, adult: Secondary | ICD-10-CM | POA: Diagnosis not present

## 2022-03-03 DIAGNOSIS — F319 Bipolar disorder, unspecified: Secondary | ICD-10-CM

## 2022-03-03 DIAGNOSIS — J45901 Unspecified asthma with (acute) exacerbation: Secondary | ICD-10-CM | POA: Diagnosis present

## 2022-03-03 DIAGNOSIS — E119 Type 2 diabetes mellitus without complications: Secondary | ICD-10-CM | POA: Diagnosis present

## 2022-03-03 DIAGNOSIS — J4521 Mild intermittent asthma with (acute) exacerbation: Secondary | ICD-10-CM | POA: Diagnosis not present

## 2022-03-03 DIAGNOSIS — F419 Anxiety disorder, unspecified: Secondary | ICD-10-CM | POA: Diagnosis present

## 2022-03-03 DIAGNOSIS — J4550 Severe persistent asthma, uncomplicated: Secondary | ICD-10-CM

## 2022-03-03 DIAGNOSIS — J309 Allergic rhinitis, unspecified: Secondary | ICD-10-CM | POA: Diagnosis present

## 2022-03-03 DIAGNOSIS — Z825 Family history of asthma and other chronic lower respiratory diseases: Secondary | ICD-10-CM | POA: Diagnosis not present

## 2022-03-03 DIAGNOSIS — Z20822 Contact with and (suspected) exposure to covid-19: Secondary | ICD-10-CM | POA: Diagnosis present

## 2022-03-03 DIAGNOSIS — Z9151 Personal history of suicidal behavior: Secondary | ICD-10-CM | POA: Diagnosis not present

## 2022-03-03 DIAGNOSIS — J4551 Severe persistent asthma with (acute) exacerbation: Secondary | ICD-10-CM | POA: Diagnosis present

## 2022-03-03 DIAGNOSIS — Z818 Family history of other mental and behavioral disorders: Secondary | ICD-10-CM | POA: Diagnosis not present

## 2022-03-03 DIAGNOSIS — Z79899 Other long term (current) drug therapy: Secondary | ICD-10-CM | POA: Diagnosis not present

## 2022-03-03 LAB — CBC WITH DIFFERENTIAL/PLATELET
Abs Immature Granulocytes: 0.05 10*3/uL (ref 0.00–0.07)
Basophils Absolute: 0.1 10*3/uL (ref 0.0–0.1)
Basophils Relative: 0 %
Eosinophils Absolute: 0.1 10*3/uL (ref 0.0–0.5)
Eosinophils Relative: 1 %
HCT: 37.9 % (ref 36.0–46.0)
Hemoglobin: 12.7 g/dL (ref 12.0–15.0)
Immature Granulocytes: 0 %
Lymphocytes Relative: 10 %
Lymphs Abs: 1.3 10*3/uL (ref 0.7–4.0)
MCH: 31.2 pg (ref 26.0–34.0)
MCHC: 33.5 g/dL (ref 30.0–36.0)
MCV: 93.1 fL (ref 80.0–100.0)
Monocytes Absolute: 0.3 10*3/uL (ref 0.1–1.0)
Monocytes Relative: 2 %
Neutro Abs: 11.6 10*3/uL — ABNORMAL HIGH (ref 1.7–7.7)
Neutrophils Relative %: 87 %
Platelets: 295 10*3/uL (ref 150–400)
RBC: 4.07 MIL/uL (ref 3.87–5.11)
RDW: 12.9 % (ref 11.5–15.5)
WBC: 13.5 10*3/uL — ABNORMAL HIGH (ref 4.0–10.5)
nRBC: 0 % (ref 0.0–0.2)

## 2022-03-03 LAB — CBG MONITORING, ED: Glucose-Capillary: 135 mg/dL — ABNORMAL HIGH (ref 70–99)

## 2022-03-03 LAB — BASIC METABOLIC PANEL
Anion gap: 7 (ref 5–15)
BUN: 13 mg/dL (ref 6–20)
CO2: 21 mmol/L — ABNORMAL LOW (ref 22–32)
Calcium: 8.8 mg/dL — ABNORMAL LOW (ref 8.9–10.3)
Chloride: 110 mmol/L (ref 98–111)
Creatinine, Ser: 0.8 mg/dL (ref 0.44–1.00)
GFR, Estimated: >60 mL/min (ref >60)
Glucose, Bld: 101 mg/dL — ABNORMAL HIGH (ref 70–99)
Potassium: 4.2 mmol/L (ref 3.5–5.1)
Sodium: 138 mmol/L (ref 135–145)

## 2022-03-03 LAB — GLUCOSE, CAPILLARY
Glucose-Capillary: 118 mg/dL — ABNORMAL HIGH (ref 70–99)
Glucose-Capillary: 134 mg/dL — ABNORMAL HIGH (ref 70–99)

## 2022-03-03 LAB — RESP PANEL BY RT-PCR (FLU A&B, COVID) ARPGX2
Influenza A by PCR: NEGATIVE
Influenza B by PCR: NEGATIVE
SARS Coronavirus 2 by RT PCR: NEGATIVE

## 2022-03-03 MED ORDER — INSULIN ASPART 100 UNIT/ML IJ SOLN: 0.0000 [IU] | Freq: Every day | INTRAMUSCULAR | Status: DC

## 2022-03-03 MED ORDER — ONDANSETRON HCL 4 MG/2ML IJ SOLN
4.0000 mg | Freq: Four times a day (QID) | INTRAMUSCULAR | Status: DC | PRN
Start: 2022-03-03 — End: 2022-03-05

## 2022-03-03 MED ORDER — ONDANSETRON HCL 4 MG PO TABS: 4.0000 mg | ORAL_TABLET | Freq: Four times a day (QID) | ORAL | Status: DC | PRN

## 2022-03-03 MED ORDER — MAGNESIUM SULFATE 2 GM/50ML IV SOLN
2.0000 g | Freq: Once | INTRAVENOUS | Status: AC
  Administered 2022-03-03: 2 g via INTRAVENOUS
  Filled 2022-03-03: qty 50

## 2022-03-03 MED ORDER — INSULIN ASPART 100 UNIT/ML IJ SOLN
0.0000 [IU] | Freq: Three times a day (TID) | INTRAMUSCULAR | Status: DC
  Administered 2022-03-03 – 2022-03-04 (×3): 2 [IU] via SUBCUTANEOUS
  Administered 2022-03-05: 3 [IU] via SUBCUTANEOUS

## 2022-03-03 MED ORDER — ACETAMINOPHEN 325 MG PO TABS
650.0000 mg | ORAL_TABLET | Freq: Four times a day (QID) | ORAL | Status: DC | PRN
  Administered 2022-03-03: 650 mg via ORAL
  Filled 2022-03-03: qty 2

## 2022-03-03 MED ORDER — IPRATROPIUM-ALBUTEROL 0.5-2.5 (3) MG/3ML IN SOLN
3.0000 mL | Freq: Once | RESPIRATORY_TRACT | Status: AC
  Administered 2022-03-03: 3 mL via RESPIRATORY_TRACT
  Filled 2022-03-03: qty 3

## 2022-03-03 MED ORDER — LORAZEPAM 2 MG/ML IJ SOLN
0.5000 mg | Freq: Once | INTRAMUSCULAR | Status: AC
  Administered 2022-03-03: 0.5 mg via INTRAVENOUS
  Filled 2022-03-03: qty 1

## 2022-03-03 MED ORDER — ACETAMINOPHEN 650 MG RE SUPP: 650.0000 mg | Freq: Four times a day (QID) | RECTAL | Status: DC | PRN

## 2022-03-03 MED ORDER — HEPARIN SODIUM (PORCINE) 5000 UNIT/ML IJ SOLN
5000.0000 [IU] | Freq: Three times a day (TID) | INTRAMUSCULAR | Status: DC
  Administered 2022-03-03 – 2022-03-05 (×6): 5000 [IU] via SUBCUTANEOUS
  Filled 2022-03-03 (×6): qty 1

## 2022-03-03 MED ORDER — IPRATROPIUM-ALBUTEROL 0.5-2.5 (3) MG/3ML IN SOLN
3.0000 mL | Freq: Four times a day (QID) | RESPIRATORY_TRACT | Status: DC
  Administered 2022-03-03 – 2022-03-04 (×4): 3 mL via RESPIRATORY_TRACT
  Filled 2022-03-03 (×4): qty 3

## 2022-03-03 MED ORDER — METHYLPREDNISOLONE SODIUM SUCC 40 MG IJ SOLR
40.0000 mg | Freq: Three times a day (TID) | INTRAMUSCULAR | Status: DC
  Administered 2022-03-03 – 2022-03-05 (×6): 40 mg via INTRAVENOUS
  Filled 2022-03-03 (×6): qty 1

## 2022-03-03 MED ORDER — MAGNESIUM SULFATE 2 GM/50ML IV SOLN
2.0000 g | Freq: Once | INTRAVENOUS | Status: AC
Start: 2022-03-03 — End: 2022-03-03
  Administered 2022-03-03: 2 g via INTRAVENOUS
  Filled 2022-03-03: qty 50

## 2022-03-03 NOTE — Progress Notes (Signed)
NEW ADMISSION NOTE ?New Admission Note:  ? ?Arrival Method: ED stretcher ?Mental Orientation: AAOx4 ?Telemetry: 5M07 ?Assessment: Completed ?Skin: to be completed ?IV: LAC ?Pain:0/10 ?Tubes: n/a ?Safety Measures: Safety Fall Prevention Plan has been given, discussed and signed ?Admission: Completed ?[facility] Orientation: Patient has been orientated to the room, unit and staff.  ?Family: none at bedside ? ?Orders have been reviewed and implemented. Will continue to monitor the patient. Call light has been placed within reach and bed alarm has been activated.  ? ?Myrtis Hopping, RN   ?

## 2022-03-03 NOTE — Assessment & Plan Note (Signed)
Check A1C. Add SSI. 

## 2022-03-03 NOTE — Plan of Care (Signed)
  Problem: Education: Goal: Knowledge of General Education information will improve Description: Including pain rating scale, medication(s)/side effects and non-pharmacologic comfort measures Outcome: Progressing   Problem: Clinical Measurements: Goal: Ability to maintain clinical measurements within normal limits will improve Outcome: Progressing   Problem: Safety: Goal: Ability to remain free from injury will improve Outcome: Progressing   

## 2022-03-03 NOTE — H&P (Signed)
?History and Physical  ? ? ?Whitney Beard OZH:[id number] DOB: 09-17-2001 DOA: 03/02/2022 ? ?DOS: the patient was seen and examined on 03/02/2022 ? ?PCP: Patient, No Pcp Per (Inactive)  ? ?Patient coming from: Home ? ?I have personally briefly reviewed patient's old medical records in [name] ? ?CC: SOB ?HPI: ?21 yo AAF with hx of severe persistent asthma presents to the ER today with shortness of breath.  Patient noted to be diffusely wheezing.  Received continuous albuterol treatment.  Also received IV Solu-Medrol in the ER.  Due to the patient's anxiety, patient was given IV Ativan and is now sedated.  She can barely stay awake for more than 10 seconds.  She still wheezing despite continuous albuterol.  [facility] contacted for admission. ? ?Upon review of the chart, the patient was seen by allergy at [facility] in October 2022.  She had an Ig E level drawn.  This was greater than 5000.  She would be a candidate for biologic agent such as Xolair. ? ?Patient admits she has not followed up with allergy since her appointment. ? ?[facility] hospitalist contacted for admission.  ? ?ED Course: Still wheezing despite continuous albuterol neb.  Already given Solu-Medrol and IV magnesium. ? ?Review of Systems:  ?Review of Systems  ?Unable to perform ROS: Other  due to sedation. Pt given IV ativan prior to her interview with me ? ?Past Medical History:  ?Diagnosis Date  ? ADHD (attention deficit hyperactivity disorder)   ? Allergic rhinitis   ? Asthma   ? Bipolar 1 disorder (HCC)   ? Diabetes mellitus without complication (HCC)   ? states today 11/11/20 "was told in the past that she was pre- diabetic"  ? Obesity   ? Sleep apnea   ? Suicide attempt [name])   ? Suicide attempt by drug ingestion (HCC)   ? ? ?Past Surgical History:  ?Procedure Laterality Date  ? ADENOIDECTOMY    ? TONSILLECTOMY    ? ? ? reports that she has been smoking cigarettes. She has been smoking an average of .5 packs per day. She has never  used smokeless tobacco. She reports current alcohol use. She reports current drug use. Drug: Marijuana. ? ?Allergies  ?Allergen Reactions  ? Other Shortness Of Breath  ?  Pt allergic to animals such as cats and dogs and pollen ?Reaction: sneezing, asthma trigger   ? ? ?Family History  ?Problem Relation Age of Onset  ? Bipolar disorder Father   ? Schizophrenia Father   ? Liver disease Father   ? Asthma Father   ? Asthma Maternal Grandmother   ? Schizophrenia Maternal Grandmother   ? SIDS Maternal Aunt   ? Heart disease Paternal Grandmother   ? Diabetes Paternal Grandmother   ? Seizures Paternal Grandmother   ? ? ?Prior to Admission medications   ?Medication Sig Start Date End Date Taking? Authorizing Provider  ?albuterol (PROVENTIL) (2.5 MG/3ML) 0.083% nebulizer solution Take 3 mLs (2.5 mg total) by nebulization every 6 (six) hours as needed for wheezing or shortness of breath. 06/03/21  Yes Curatolo, Adam, DO  ?albuterol (VENTOLIN HFA) 108 (90 Base) MCG/ACT inhaler Inhale 2 puffs into the lungs every 4 (four) hours as needed for wheezing or shortness of breath. 10/12/20  Yes [name], DO  ?hydrOXYzine (ATARAX/VISTARIL) 25 MG tablet Take 1 tablet (25 mg total) by mouth every 6 (six) hours as needed for anxiety. 11/15/20  Yes Armandina Stammer I, NP  ?lurasidone (LATUDA) 40 MG TABS  tablet Take 1 tablet (40 mg total) by mouth daily with supper. For mood control ?Patient not taking: Reported on 03/03/2022 11/15/20   Armandina Stammer I, NP  ?mometasone-formoterol (DULERA) 200-5 MCG/ACT AERO Inhale 2 puffs into the lungs 2 (two) times daily. ?Patient not taking: Reported on 04/06/2021 10/12/20 11/11/20  [name], DO  ?sertraline (ZOLOFT) 25 MG tablet Take 1 tablet (25 mg total) by mouth daily. For depression ?Patient not taking: Reported on 03/03/2022 11/15/20   Armandina Stammer I, NP  ?traZODone (DESYREL) 50 MG tablet Take 1 tablet (50 mg total) by mouth at bedtime as needed for sleep. ?Patient not taking: Reported on 03/03/2022  11/15/20   Armandina Stammer I, NP  ? ? ?Physical Exam: ?Vitals:  ? 03/02/22 2231 03/02/22 2244 03/03/22 0005 03/03/22 0100  ?BP:   108/81 (!) 115/55  ?Pulse:   (!) 107 (!) 102  ?Resp:   (!) 22 18  ?Temp:      ?TempSrc:      ?SpO2:  100% 100% 99%  ?Weight: (!) 137 kg     ?Height: 5\' 6"  (1.676 m)     ? ? ?Physical Exam ?Vitals and nursing note reviewed.  ?Constitutional:   ?   Appearance: She is obese. She is ill-appearing. She is not toxic-appearing.  ?HENT:  ?   Head: Normocephalic and atraumatic.  ?   Nose: Nose normal. No rhinorrhea.  ?Cardiovascular:  ?   Rate and Rhythm: Regular rhythm. Tachycardia present.  ?Pulmonary:  ?   Effort: Tachypnea present.  ?   Breath sounds: Wheezing present.  ?   Comments: Prolonged I:E ratio. ?Diffuse wheezing on lung fields. ?No accessory muscle use. ?Abdominal:  ?   General: Abdomen is protuberant. Bowel sounds are normal.  ?   Palpations: Abdomen is soft.  ?   Tenderness: There is no abdominal tenderness. There is no guarding or rebound.  ?Musculoskeletal:  ?   Right lower leg: No edema.  ?   Left lower leg: No edema.  ?Skin: ?   General: Skin is warm and dry.  ?   Capillary Refill: Capillary refill takes less than 2 seconds.  ?Neurological:  ?   Comments: Sedated but will wake up for about 10 seconds.  ?  ? ?Labs on Admission: I have personally reviewed following labs and imaging studies ? ?CBC: ?Recent Labs  ?Lab 03/02/22 ?2330  ?WBC 13.5*  ?NEUTROABS 11.6*  ?HGB 12.7  ?HCT 37.9  ?MCV 93.1  ?PLT 295  ? ?Basic Metabolic Panel: ?Recent Labs  ?Lab 03/02/22 ?2330  ?NA 138  ?K 4.2  ?CL 110  ?CO2 21*  ?GLUCOSE 101*  ?BUN 13  ?CREATININE 0.80  ?CALCIUM 8.8*  ? ?GFR: ?Estimated Creatinine Clearance: 158.7 mL/min (by C-G formula based on SCr of 0.8 mg/dL). ?Liver Function Tests: ?No results for input(s): AST, ALT, ALKPHOS, BILITOT, PROT, ALBUMIN in the last 168 hours. ?No results for input(s): LIPASE, AMYLASE in the last 168 hours. ?No results for input(s): AMMONIA in the last 168  hours. ?Coagulation Profile: ?No results for input(s): INR, PROTIME in the last 168 hours. ?Cardiac Enzymes: ?No results for input(s): CKTOTAL, CKMB, CKMBINDEX, TROPONINI, TROPONINIHS in the last 168 hours. ?BNP (last 3 results) ?No results for input(s): PROBNP in the last 8760 hours. ?HbA1C: ?No results for input(s): HGBA1C in the last 72 hours. ?CBG: ?No results for input(s): GLUCAP in the last 168 hours. ?Lipid Profile: ?No results for input(s): CHOL, HDL, LDLCALC, TRIG, CHOLHDL, LDLDIRECT in the last 72  hours. ?Thyroid Function Tests: ?No results for input(s): TSH, T4TOTAL, FREET4, T3FREE, THYROIDAB in the last 72 hours. ?Anemia Panel: ?No results for input(s): VITAMINB12, FOLATE, FERRITIN, TIBC, IRON, RETICCTPCT in the last 72 hours. ?Urine analysis: ?   ?Component Value Date/Time  ? COLORURINE YELLOW 01/24/2017 1140  ? APPEARANCEUR HAZY (A) 01/24/2017 1140  ? LABSPEC 1.025 01/24/2017 1140  ? PHURINE 5.0 01/24/2017 1140  ? GLUCOSEU NEGATIVE 01/24/2017 1140  ? HGBUR NEGATIVE 01/24/2017 1140  ? BILIRUBINUR NEGATIVE 01/24/2017 1140  ? KETONESUR NEGATIVE 01/24/2017 1140  ? PROTEINUR NEGATIVE 01/24/2017 1140  ? UROBILINOGEN 0.2 01/08/2013 2339  ? NITRITE NEGATIVE 01/24/2017 1140  ? LEUKOCYTESUR NEGATIVE 01/24/2017 1140  ? ? ?Radiological Exams on Admission: I have personally reviewed images ?No results found. ? ?EKG: I have personally reviewed EKG: sinus tachycardia ? ? ?Assessment/Plan ?Principal Problem: ?  Severe persistent allergic asthma ?Active Problems: ?  Type 2 diabetes mellitus without complication, without long-term current use of insulin (HCC) ?  Class 3 severe obesity due to excess calories with serious comorbidity and body mass index (BMI) of 50.0 to 59.9 in adult [facility](HCC) ?  Bipolar 1 disorder (HCC) ?  Borderline personality disorder (HCC) ?  ? ?Assessment and Plan: ?* Severe persistent allergic asthma ?With acute exacerbation. Admit to observation progressive bed. Check for covid, check CXR. Pt saw  allergist in 08-2021. Has not followed up. IgE >5000. Would be a candidate for biologic agent such as Xolair. Pt needs to f/u with allergy. Continue with IV solumedrol. duonebs q6h. Already received IV mag in ER. ?

## 2022-03-03 NOTE — Subjective & Objective (Signed)
CC: SOB ?HPI: ?21 yo AAF with hx of severe persistent asthma presents to the ER today with shortness of breath.  Patient noted to be diffusely wheezing.  Received continuous albuterol treatment.  Also received IV Solu-Medrol in the ER.  Due to the patient's anxiety, patient was given IV Ativan and is now sedated.  She can barely stay awake for more than 10 seconds.  She still wheezing despite continuous albuterol.  [facility] hospitalist contacted for admission. ? ?Upon review of the chart, the patient was seen by allergy at [facility] in October 2022.  She had an Ig E level drawn.  This was greater than 5000.  She would be a candidate for biologic agent such as Xolair. ? ?Patient admits she has not followed up with allergy since her appointment. ? ?[facility] hospitalist contacted for admission. ?

## 2022-03-03 NOTE — Assessment & Plan Note (Signed)
Chronic. 

## 2022-03-03 NOTE — Assessment & Plan Note (Signed)
Stable

## 2022-03-03 NOTE — Progress Notes (Signed)
Briefly, patient is a 21 year old female with history of persistent asthma, bipolar disorder and borderline personality disorder who was admitted earlier today with acute exacerbation of asthma.  Patient was treated with steroids, inhaled bronchodilators and oxygen. ? ?This morning, patient states that she is still wheezing and is worried about that.  We discussed her smoking and she states that she has given up vaping last week and only smokes marijuana.  Does not use tobacco.  Patient states she understands she has to give both of those up. ? ?Lung exam revealed inspiratory and expiratory wheezing in all lung fields although reasonably good air entry.  Patient was able to speak in full sentences at great length without difficulty. ? ?Plan is to continue present management as already initiated and to follow lung exam and respiratory status closely. ?

## 2022-03-03 NOTE — Assessment & Plan Note (Signed)
On latuda and zoloft. ?

## 2022-03-03 NOTE — ED Notes (Signed)
Pt's mother at bedside & has been updated. ?

## 2022-03-03 NOTE — Assessment & Plan Note (Signed)
With acute exacerbation. Admit to observation progressive bed. Check for covid, check CXR. Pt saw allergist in 08-2021. Has not followed up. IgE >5000. Would be a candidate for biologic agent such as Xolair. Pt needs to f/u with allergy. Continue with IV solumedrol. duonebs q6h. Already received IV mag in ER. ?

## 2022-03-04 ENCOUNTER — Other Ambulatory Visit (HOSPITAL_COMMUNITY): Payer: Self-pay

## 2022-03-04 DIAGNOSIS — J4521 Mild intermittent asthma with (acute) exacerbation: Secondary | ICD-10-CM

## 2022-03-04 DIAGNOSIS — J4550 Severe persistent asthma, uncomplicated: Secondary | ICD-10-CM | POA: Diagnosis not present

## 2022-03-04 DIAGNOSIS — F603 Borderline personality disorder: Secondary | ICD-10-CM | POA: Diagnosis not present

## 2022-03-04 DIAGNOSIS — F319 Bipolar disorder, unspecified: Secondary | ICD-10-CM | POA: Diagnosis not present

## 2022-03-04 LAB — GLUCOSE, CAPILLARY
Glucose-Capillary: 130 mg/dL — ABNORMAL HIGH (ref 70–99)
Glucose-Capillary: 102 mg/dL — ABNORMAL HIGH (ref 70–99)
Glucose-Capillary: 122 mg/dL — ABNORMAL HIGH (ref 70–99)
Glucose-Capillary: 129 mg/dL — ABNORMAL HIGH (ref 70–99)

## 2022-03-04 LAB — HIV ANTIBODY (ROUTINE TESTING W REFLEX): HIV Screen 4th Generation wRfx: NONREACTIVE

## 2022-03-04 MED ORDER — MOMETASONE FURO-FORMOTEROL FUM 200-5 MCG/ACT IN AERO: 2.0000 | INHALATION_SPRAY | Freq: Two times a day (BID) | RESPIRATORY_TRACT | Status: DC

## 2022-03-04 MED ORDER — MOMETASONE FURO-FORMOTEROL FUM 200-5 MCG/ACT IN AERO
2.0000 | INHALATION_SPRAY | Freq: Two times a day (BID) | RESPIRATORY_TRACT | Status: DC
  Administered 2022-03-04 – 2022-03-05 (×2): 2 via RESPIRATORY_TRACT
  Filled 2022-03-04: qty 8.8

## 2022-03-04 MED ORDER — MONTELUKAST SODIUM 10 MG PO TABS
10.0000 mg | ORAL_TABLET | Freq: Every day | ORAL | Status: DC
  Administered 2022-03-04: 10 mg via ORAL
  Filled 2022-03-04: qty 1

## 2022-03-04 MED ORDER — IPRATROPIUM-ALBUTEROL 0.5-2.5 (3) MG/3ML IN SOLN
3.0000 mL | Freq: Three times a day (TID) | RESPIRATORY_TRACT | Status: DC
  Administered 2022-03-04 – 2022-03-05 (×4): 3 mL via RESPIRATORY_TRACT
  Filled 2022-03-04 (×4): qty 3

## 2022-03-04 NOTE — Plan of Care (Signed)

## 2022-03-04 NOTE — TOC Benefit Eligibility Note (Signed)
Patient Advocate Encounter ? ?Insurance verification completed.   ? ?The patient is currently admitted and upon discharge could be taking Xolair 150 mg/ml prefilled syringe. ? ?Requires Prior Authorization ? ?The patient is insured through [facility] Medicaid  ? ? ?Roland Earl, CPhT ?Pharmacy Patient Advocate Specialist ?[facility] Patient Advocate Team ?Direct Number: [phone]  Fax: [phone] ? ? ? ? ? ?  ?

## 2022-03-04 NOTE — Progress Notes (Signed)
? ? ? [facility] ?                                                                            ? ? ?Whitney Beard, is a 21 y.o. female, DOB - 22-Sep-2001, BO:[id number] ?Admit date - 03/02/2022    ?Outpatient Primary MD for the patient is Patient, No Pcp Per (Inactive) ? ?LOS - 1  days ? ? ? ?Brief summary  ? ?Patient is a 21 year old female with severe persistent asthma presented with shortness of breath.  Patient noted to be diffusely wheezing, received continuous albuterol treatment.  She also received IV Solu-Medrol in ED. patient was admitted for further work-up. ? ?  ?Upon review of the chart, the patient was seen by allergy at [facility] in October 2022.  She had an Ig E level drawn.  This was greater than 5000.  She would be a candidate for biologic agent such as Xolair. ?  ?Patient admits she has not followed up with allergy since her appointment ? ? ?Assessment & Plan  ? ? ?Assessment and Plan: ?* Severe persistent allergic asthma ?-Presented with acute exacerbation.  Patient had seen allergy immunology at [facility] in 08/2021, IgE was 5097.2 she did not follow-up with pulmonology or allergy immunology. ?-Continue scheduled nebs, added Dulera, continue IV Solu-Medrol today ?-Started on Singulair, would be a candidate for biologic agents like Xolair.  Discussed with the patient, she will follow-up with her allergist ? ?Borderline personality disorder ([place]) ?Currently stable, no acute issues ? ?Bipolar 1 disorder ([place]) ?Continue latuda and zoloft. ? ?Type 2 diabetes mellitus without complication, without long-term current use of insulin ([place]) ?CBG stable, hemoglobin A1c 5.0 in 03/2021, will recheck ?Continue SSI ? ?Morbid obesity/class III severe obesity ?Estimated body mass index is 50.78 kg/m? as calculated from the following: ?  Height as of this encounter: 5\' 6"  (1.676 m). ?  Weight as of this encounter: 142.7 kg. ? ?Code Status: Full CODE STATUS ?DVT Prophylaxis:  heparin  injection 5,000 Units Start: 03/03/22 1400 ?SCDs Start: 03/03/22 1023 ? ? ?Level of Care: Level of care: Med-Surg ?Family Communication: Updated patient ? ?Disposition Plan:     Remains inpatient appropriate: Still mildly wheezing, normal baseline, hopefully DC home in a.m. ? ?Procedures:  ?None ? ?Consultants:   ?None ? ?Medications ? ? heparin  5,000 Units Subcutaneous Q8H  ? insulin aspart  0-15 Units Subcutaneous TID WC  ? insulin aspart  0-5 Units Subcutaneous QHS  ? ipratropium-albuterol  3 mL Nebulization TID  ? methylPREDNISolone (SOLU-MEDROL) injection  40 mg Intravenous Q8H  ? mometasone-formoterol  2 puff Inhalation BID  ? montelukast  10 mg Oral QHS  ? ? ? ?Subjective:  ? ?Whitney Beard was seen and examined today.  Feels much more improved from the time of admission, still wheezing, though improving.  No chest pain, no acute shortness of breath.  No nausea or vomiting.  ? ?Objective:  ? ?Vitals:  ? 03/04/22 0500 03/04/22 0606 03/04/22 0753 03/04/22 [id number]  ?BP:  133/77  130/79  ?Pulse:  (!) 54 80 79  ?Resp:  18 18 16   ?Temp:  98.3 ?F (36.8 ?C)  99.2 ?F (37.3 ?C)  ?  TempSrc:  Oral  Oral  ?SpO2:  96% 98% 96%  ?Weight: (!) 142.7 kg     ?Height:      ? ? ?Intake/Output Summary (Last 24 hours) at 03/04/2022 1550 ?Last data filed at 03/04/2022 0900 ?Gross per 24 hour  ?Intake 320 ml  ?Output 0 ml  ?Net 320 ml  ? ?Filed Weights  ? 03/02/22 2231 03/03/22 1605 03/04/22 0500  ?Weight: (!) 137 kg (!) 139.1 kg (!) 142.7 kg  ? ? ? ?Exam ?General: Alert and oriented x 3, NAD ?Cardiovascular: S1 S2 auscultated, RR ?Respiratory: Bilateral expiratory wheezing ?Gastrointestinal: Soft, nontender, nondistended, + bowel sounds ?Ext: no pedal edema bilaterally ?Neuro: no new FND ?Skin: No rashes ? ? ?Data Reviewed:  I have personally reviewed following labs  ? ? ?CBC ?Lab Results  ?Component Value Date  ? WBC 13.5 (H) 03/02/2022  ? RBC 4.07 03/02/2022  ? HGB 12.7 03/02/2022  ? HCT 37.9 03/02/2022  ? MCV 93.1 03/02/2022  ? MCH 31.2  03/02/2022  ? PLT 295 03/02/2022  ? MCHC 33.5 03/02/2022  ? RDW 12.9 03/02/2022  ? LYMPHSABS 1.3 03/02/2022  ? MONOABS 0.3 03/02/2022  ? EOSABS 0.1 03/02/2022  ? BASOSABS 0.1 03/02/2022  ? ? ? ?Last metabolic panel ?Lab Results  ?Component Value Date  ? NA 138 03/02/2022  ? K 4.2 03/02/2022  ? CL 110 03/02/2022  ? CO2 21 (L) 03/02/2022  ? BUN 13 03/02/2022  ? CREATININE 0.80 03/02/2022  ? GLUCOSE 101 (H) 03/02/2022  ? GFRNONAA >60 03/02/2022  ? GFRAA >60 06/27/2020  ? CALCIUM 8.8 (L) 03/02/2022  ? PHOS 4.6 10/11/2020  ? PROT 6.8 04/06/2021  ? ALBUMIN 3.6 04/06/2021  ? BILITOT 0.8 04/06/2021  ? ALKPHOS 66 04/06/2021  ? AST 17 04/06/2021  ? ALT 12 04/06/2021  ? ANIONGAP 7 03/02/2022  ? ? ?CBG (last 3)  ?Recent Labs  ?  03/04/22 ?0719 03/04/22 ?1111 03/04/22 ?1524  ?GLUCAP 130* 102* 122*  ?  ? ? ? ? ?Radiology Studies: I have personally reviewed the imaging studies  ?DG Chest Portable 1 View ? ?Result Date: 03/03/2022 ?CLINICAL DATA:  Shortness of breath EXAM: PORTABLE CHEST 1 VIEW COMPARISON:  06/03/2021 FINDINGS: The heart size and mediastinal contours are within normal limits. Both lungs are clear. The visualized skeletal structures are unremarkable. IMPRESSION: Normal study. Electronically Signed   By: Rolm Baptise M.D.   On: 03/03/2022 02:39   ? ? ? ? ?Estill Cotta M.D. ?[facility] ?03/04/2022, 3:50 PM ? ?Available via Epic secure chat 7am-7pm ?After 7 pm, please refer to night coverage provider listed on amion. ? ?  ?

## 2022-03-05 DIAGNOSIS — F319 Bipolar disorder, unspecified: Secondary | ICD-10-CM | POA: Diagnosis not present

## 2022-03-05 DIAGNOSIS — J4551 Severe persistent asthma with (acute) exacerbation: Secondary | ICD-10-CM | POA: Diagnosis not present

## 2022-03-05 DIAGNOSIS — F603 Borderline personality disorder: Secondary | ICD-10-CM | POA: Diagnosis not present

## 2022-03-05 DIAGNOSIS — J4550 Severe persistent asthma, uncomplicated: Secondary | ICD-10-CM | POA: Diagnosis not present

## 2022-03-05 LAB — HEMOGLOBIN A1C
Hgb A1c MFr Bld: 4.8 % (ref 4.8–5.6)
Mean Plasma Glucose: 91.06 mg/dL

## 2022-03-05 LAB — GLUCOSE, CAPILLARY: Glucose-Capillary: 153 mg/dL — ABNORMAL HIGH (ref 70–99)

## 2022-03-05 MED ORDER — ALBUTEROL SULFATE (2.5 MG/3ML) 0.083% IN NEBU: 2.5000 mg | INHALATION_SOLUTION | Freq: Four times a day (QID) | RESPIRATORY_TRACT | 2 refills | Status: DC | PRN

## 2022-03-05 MED ORDER — ALBUTEROL SULFATE HFA 108 (90 BASE) MCG/ACT IN AERS: 2.0000 | INHALATION_SPRAY | RESPIRATORY_TRACT | 4 refills | Status: DC | PRN

## 2022-03-05 MED ORDER — MONTELUKAST SODIUM 10 MG PO TABS: 10.0000 mg | ORAL_TABLET | Freq: Every day | ORAL | 3 refills | Status: DC

## 2022-03-05 MED ORDER — PREDNISONE 10 MG PO TABS
ORAL_TABLET | ORAL | 0 refills | Status: DC
Start: 2022-03-05 — End: 2022-09-18

## 2022-03-05 MED ORDER — MOMETASONE FURO-FORMOTEROL FUM 200-5 MCG/ACT IN AERO
2.0000 | INHALATION_SPRAY | Freq: Two times a day (BID) | RESPIRATORY_TRACT | 3 refills | Status: DC
Start: 2022-03-05 — End: 2022-09-18

## 2022-03-05 MED ORDER — BENZONATATE 100 MG PO CAPS: 100.0000 mg | ORAL_CAPSULE | Freq: Three times a day (TID) | ORAL | 0 refills | Status: DC | PRN

## 2022-03-05 NOTE — Progress Notes (Signed)
DISCHARGE NOTE HOME ?Roxana Hires to be discharged Home per MD order. Discussed prescriptions and follow up appointments with the patient. Prescriptions given to patient; medication list explained in detail. Patient verbalized understanding. ? ?Skin clean, dry and intact without evidence of skin break down, no evidence of skin tears noted. IV catheter discontinued intact. Site without signs and symptoms of complications. Dressing and pressure applied. Pt denies pain at the site currently. No complaints noted. ? ?Patient free of lines, drains, and wounds.  ? ?An After Visit Summary (AVS) was printed and given to the patient. ?Patient escorted via wheelchair, and discharged home via private auto. ? ?Velia Meyer, RN  ?

## 2022-03-05 NOTE — Discharge Summary (Signed)
?Physician Discharge Summary ?  ?Patient: Whitney Beard MRN: [id number] DOB: 03-27-2001  ?Admit date:     03/02/2022  ?Discharge date: 03/05/22  ?Discharge Physician: Kazuki Ingle  ? ?PCP: Patient follows allergy immunology, pulmonology at [facility] ? ?Recommendations at discharge:  ? ?Continue prednisone taper, refilled her prescription for Dulera, albuterol, ? added Singulair 10 mg daily ?Recommended to follow-up with her allergist and pulmonologist [facility] ? ?Discharge Diagnoses: ? ?Acute asthma exacerbation ?  Severe persistent allergic asthma ?  Type 2 diabetes mellitus without complication, without long-term current use of insulin (HCC) ?  Class 3 severe obesity due to excess calories with serious comorbidity and body mass index (BMI) of 50.0 to 59.9 in adult [facility]) ?  Bipolar 1 disorder (HCC) ?  Borderline personality disorder (HCC) ? ? ?Hospital Course: ? ?Patient is a 21 year old female with severe persistent asthma presented with shortness of breath.  Patient noted to be diffusely wheezing, received continuous albuterol treatment.  She also received IV Solu-Medrol in ED. patient was admitted for further work-up. ?  ?  ?Upon review of the chart, the patient was seen by allergy at [facility] in October 2022.  She had an Ig E level drawn.  This was greater than 5000.  She would be a candidate for biologic agent such as Xolair. ?  ?Patient admits she has not followed up with allergy since her appointment ? ?Assessment and Plan: ? ?Severe persistent allergic asthma ?-Presented with acute exacerbation.  Patient had seen allergy immunology at [facility] in 08/2021, IgE was 5097.2 she did not follow-up with pulmonology or allergy immunology. ?-Continue albuterol inhaler, added Dulera, transition to oral prednisone with taper ?-Started on Singulair, would be a candidate for biologic agents like Xolair.  Discussed with the patient, she will follow-up with her allergist and pulmonologist at [facility] ?  ?Borderline personality disorder (HCC) ?Currently stable, no acute issues ?  ?Bipolar 1 disorder (HCC) ?Continue latuda and zoloft. ?  ?Type 2 diabetes mellitus without complication, without long-term current use of insulin (HCC) ?Hemoglobin A1c 4.8, good control ?  ?Morbid obesity/class III severe obesity ?Estimated body mass index is 50.78 kg/m? as calculated from the following: ?  Height as of this encounter: 5\' 6"  (1.676 m). ?  Weight as of this encounter: 142.7 kg. ?  ? ? ? ?Pain control - Controlled Substance Reporting System database was reviewed. and patient was instructed, not to drive, operate heavy machinery, perform activities at heights, swimming or participation in water activities or provide baby-sitting services while on Pain, Sleep and Anxiety Medications; until their outpatient Physician has advised to do so again. Also recommended to not to take more than prescribed Pain, Sleep and Anxiety Medications.  ?Consultants: None ?Procedures performed: None ?Disposition: Home ?Diet recommendation:  ?Discharge Diet Orders (From admission, onward)  ? ?  Start     Ordered  ? 03/05/22 0000  Diet - low sodium heart healthy       ? 03/05/22 0942  ? ?  ?  ? ?  ? ?Carb modified diet ?DISCHARGE MEDICATION: ?Allergies as of 03/05/2022   ? ?   Reactions  ? Other Shortness Of Breath  ? Pt allergic to animals such as cats and dogs and pollen ?Reaction: sneezing, asthma trigger   ? ?  ? ?  ?Medication List  ?  ? ?TAKE these medications   ? ?albuterol 108 (90 Base) MCG/ACT inhaler ?Commonly known as: VENTOLIN HFA ?Inhale 2  puffs into the lungs every 4 (four) hours as needed for wheezing or shortness of breath. ?  ?albuterol (2.5 MG/3ML) 0.083% nebulizer solution ?Commonly known as: PROVENTIL ?Take 3 mLs (2.5 mg total) by nebulization every 6 (six) hours as needed for wheezing or shortness of breath. ?  ?benzonatate 100 MG capsule ?Commonly known as: Lawyer ?Take 1 capsule (100 mg total)  by mouth 3 (three) times daily as needed for cough. ?  ?hydrOXYzine 25 MG tablet ?Commonly known as: ATARAX ?Take 1 tablet (25 mg total) by mouth every 6 (six) hours as needed for anxiety. ?  ?mometasone-formoterol 200-5 MCG/ACT Aero ?Commonly known as: DULERA ?Inhale 2 puffs into the lungs 2 (two) times daily. ?  ?montelukast 10 MG tablet ?Commonly known as: SINGULAIR ?Take 1 tablet (10 mg total) by mouth at bedtime. ?  ?predniSONE 10 MG tablet ?Commonly known as: DELTASONE ?Prednisone dosing: Take  Prednisone 40mg  (4 tabs) x 3 days, then taper to 30mg  (3 tabs) x 3 days, then 20mg  (2 tabs) x 3days, then 10mg  (1 tab) x 3days, then OFF. ?  ? ?  ? ? Follow-up Information   ? ? , DO. Schedule an appointment as soon as possible for a visit in 1 week(s).   ?Specialty: Pediatrics ?Why: for hospital follow-up ?Contact information: ?[address] ?[address] ?   ?[phone] ? ? ?  ?  ? ? Marcy Panning, PA-C. Schedule an appointment as soon as possible for a visit in 10 day(s).   ?Specialty: Physician Assistant ?Why: for hospital follow-up ?Contact information: ?[address] ?[address] ?[place] [address] ?[phone] ? ? ?  ?  ? ?  ?  ? ?  ? ?Discharge Exam: ?Filed Weights  ? 03/03/22 1605 03/04/22 0500 03/05/22 0527  ?Weight: (!) 139.1 kg (!) 142.7 kg (!) 141.9 kg  ? ?S: Doing well, wheezing improved, ready to go home today ? ?Vitals:  ? 03/04/22 2059 03/05/22 0527 03/05/22 0847 03/05/22 0915  ?BP: 121/66 134/78  106/62  ?Pulse: (!) 54 (!) 57 74 92  ?Resp: 18 15 16 18   ?Temp: 98.5 ?F (36.9 ?C) 98.5 ?F (36.9 ?C)  99 ?F (37.2 ?C)  ?TempSrc: Oral Oral  Oral  ?SpO2: 100% 98% 96% 97%  ?Weight:  (!) 141.9 kg    ?Height:      ?  ?Physical Exam ?General: Alert and oriented x 3, NAD ?Cardiovascular: S1 S2 clear, RRR. No pedal edema b/l ?Respiratory: CTAB, no wheezing, rales or rhonchi ?Gastrointestinal: Soft, nontender, nondistended, NBS ?Ext: no pedal edema  bilaterally ?Neuro: no new deficits ?Skin: No rashes ?Psych: Normal affect and demeanor, alert and oriented x3  ? ? ?Condition at discharge: good ? ?The results of significant diagnostics from this hospitalization (including imaging, microbiology, ancillary and laboratory) are listed below for reference.  ? ?Imaging Studies: ?DG Chest Portable 1 View ? ?Result Date: 03/03/2022 ?CLINICAL DATA:  Shortness of breath EXAM: PORTABLE CHEST 1 VIEW COMPARISON:  06/03/2021 FINDINGS: The heart size and mediastinal contours are within normal limits. Both lungs are clear. The visualized skeletal structures are unremarkable. IMPRESSION: Normal study. Electronically Signed   By: 05/05/22 M.D.   On: 03/03/2022 02:39   ? ?Microbiology: ?Results for orders placed or performed during the hospital encounter of 03/02/22  ?Resp Panel by RT-PCR (Flu A&B, Covid) Nasopharyngeal Swab     Status: None  ? Collection Time: 03/03/22  2:55 AM  ? Specimen: Nasopharyngeal Swab; Nasopharyngeal(NP) swabs  in vial transport medium  ?Result Value Ref Range Status  ? SARS Coronavirus 2 by RT PCR NEGATIVE NEGATIVE Final  ?  Comment: (NOTE) ?SARS-CoV-2 target nucleic acids are NOT DETECTED. ? ?The SARS-CoV-2 RNA is generally detectable in upper respiratory ?specimens during the acute phase of infection. The lowest ?concentration of SARS-CoV-2 viral copies this assay can detect is ?138 copies/mL. A negative result does not preclude SARS-Cov-2 ?infection and should not be used as the sole basis for treatment or ?other patient management decisions. A negative result may occur with  ?improper specimen collection/handling, submission of specimen other ?than nasopharyngeal swab, presence of viral mutation(s) within the ?areas targeted by this assay, and inadequate number of viral ?copies(<138 copies/mL). A negative result must be combined with ?clinical observations, patient history, and epidemiological ?information. The expected result is Negative. ? ?Fact  Sheet for Patients:  ?BloggerCourse.comhttps://www.fda.gov/media/152166/download ? ?Fact Sheet for Healthcare Providers:  ?SeriousBroker.ithttps://www.fda.gov/media/152162/download ? ?This test is no t yet approved or cleared by the [place] FDA an

## 2022-09-17 ENCOUNTER — Ambulatory Visit (HOSPITAL_COMMUNITY)
Admission: EM | Admit: 2022-09-17 | Discharge: 2022-09-18 | Disposition: A | Discharge status: Other Acute Inpatient Hospital | Payer: Medicaid Other | Attending: Nurse Practitioner | Admitting: Nurse Practitioner

## 2022-09-17 ENCOUNTER — Other Ambulatory Visit: Payer: Self-pay

## 2022-09-17 DIAGNOSIS — F603 Borderline personality disorder: Secondary | ICD-10-CM | POA: Insufficient documentation

## 2022-09-17 DIAGNOSIS — Z9151 Personal history of suicidal behavior: Secondary | ICD-10-CM | POA: Insufficient documentation

## 2022-09-17 DIAGNOSIS — Z818 Family history of other mental and behavioral disorders: Secondary | ICD-10-CM | POA: Insufficient documentation

## 2022-09-17 DIAGNOSIS — Z639 Problem related to primary support group, unspecified: Secondary | ICD-10-CM | POA: Insufficient documentation

## 2022-09-17 DIAGNOSIS — F332 Major depressive disorder, recurrent severe without psychotic features: Secondary | ICD-10-CM

## 2022-09-17 DIAGNOSIS — Z5986 Financial insecurity: Secondary | ICD-10-CM | POA: Insufficient documentation

## 2022-09-17 DIAGNOSIS — F319 Bipolar disorder, unspecified: Secondary | ICD-10-CM | POA: Insufficient documentation

## 2022-09-17 DIAGNOSIS — R45851 Suicidal ideations: Secondary | ICD-10-CM | POA: Insufficient documentation

## 2022-09-17 DIAGNOSIS — Z79899 Other long term (current) drug therapy: Secondary | ICD-10-CM | POA: Insufficient documentation

## 2022-09-17 DIAGNOSIS — Z1152 Encounter for screening for COVID-19: Secondary | ICD-10-CM | POA: Insufficient documentation

## 2022-09-17 DIAGNOSIS — F419 Anxiety disorder, unspecified: Secondary | ICD-10-CM | POA: Insufficient documentation

## 2022-09-17 LAB — COMPREHENSIVE METABOLIC PANEL
ALT: 21 U/L (ref 0–44)
AST: 17 U/L (ref 15–41)
Albumin: 3.5 g/dL (ref 3.5–5.0)
Alkaline Phosphatase: 79 U/L (ref 38–126)
Anion gap: 10 (ref 5–15)
BUN: 9 mg/dL (ref 6–20)
CO2: 24 mmol/L (ref 22–32)
Calcium: 8.6 mg/dL — ABNORMAL LOW (ref 8.9–10.3)
Chloride: 105 mmol/L (ref 98–111)
Creatinine, Ser: 0.69 mg/dL (ref 0.44–1.00)
GFR, Estimated: >60 mL/min (ref >60)
Glucose, Bld: 93 mg/dL (ref 70–99)
Potassium: 3.7 mmol/L (ref 3.5–5.1)
Sodium: 139 mmol/L (ref 135–145)
Total Bilirubin: 0.3 mg/dL (ref 0.3–1.2)
Total Protein: 6.9 g/dL (ref 6.5–8.1)

## 2022-09-17 LAB — LIPID PANEL
Cholesterol: 153 mg/dL (ref 0–200)
HDL: 38 mg/dL — ABNORMAL LOW (ref >40)
LDL Cholesterol: 97 mg/dL (ref 0–99)
Total CHOL/HDL Ratio: 4 RATIO
Triglycerides: 89 mg/dL (ref <150)
VLDL: 18 mg/dL (ref 0–40)

## 2022-09-17 LAB — HEMOGLOBIN A1C
Hgb A1c MFr Bld: 4.7 % — ABNORMAL LOW (ref 4.8–5.6)
Mean Plasma Glucose: 88.19 mg/dL

## 2022-09-17 LAB — CBC WITH DIFFERENTIAL/PLATELET
Abs Immature Granulocytes: 0.01 10*3/uL (ref 0.00–0.07)
Basophils Absolute: 0 10*3/uL (ref 0.0–0.1)
Basophils Relative: 1 %
Eosinophils Absolute: 0.1 10*3/uL (ref 0.0–0.5)
Eosinophils Relative: 2 %
HCT: 35.7 % — ABNORMAL LOW (ref 36.0–46.0)
Hemoglobin: 12.6 g/dL (ref 12.0–15.0)
Immature Granulocytes: 0 %
Lymphocytes Relative: 52 %
Lymphs Abs: 3.1 10*3/uL (ref 0.7–4.0)
MCH: 32.6 pg (ref 26.0–34.0)
MCHC: 35.3 g/dL (ref 30.0–36.0)
MCV: 92.5 fL (ref 80.0–100.0)
Monocytes Absolute: 0.4 10*3/uL (ref 0.1–1.0)
Monocytes Relative: 7 %
Neutro Abs: 2.3 10*3/uL (ref 1.7–7.7)
Neutrophils Relative %: 38 %
Platelets: 302 10*3/uL (ref 150–400)
RBC: 3.86 MIL/uL — ABNORMAL LOW (ref 3.87–5.11)
RDW: 12.5 % (ref 11.5–15.5)
WBC: 6 10*3/uL (ref 4.0–10.5)
nRBC: 0 % (ref 0.0–0.2)

## 2022-09-17 LAB — POC SARS CORONAVIRUS 2 AG: SARSCOV2ONAVIRUS 2 AG: NEGATIVE

## 2022-09-17 LAB — RESP PANEL BY RT-PCR (FLU A&B, COVID) ARPGX2
Influenza A by PCR: NEGATIVE
Influenza B by PCR: NEGATIVE
SARS Coronavirus 2 by RT PCR: NEGATIVE

## 2022-09-17 LAB — TSH: TSH: 1.955 u[IU]/mL (ref 0.350–4.500)

## 2022-09-17 LAB — POCT PREGNANCY, URINE: Preg Test, Ur: NEGATIVE

## 2022-09-17 MED ORDER — SERTRALINE HCL 25 MG PO TABS
25.0000 mg | ORAL_TABLET | Freq: Every day | ORAL | Status: DC
  Administered 2022-09-17: 25 mg via ORAL
  Filled 2022-09-17: qty 1

## 2022-09-17 MED ORDER — ALBUTEROL SULFATE HFA 108 (90 BASE) MCG/ACT IN AERS
2.0000 | INHALATION_SPRAY | RESPIRATORY_TRACT | Status: DC | PRN
  Administered 2022-09-17: 2 via RESPIRATORY_TRACT
  Filled 2022-09-17: qty 6.7

## 2022-09-17 MED ORDER — ALUM & MAG HYDROXIDE-SIMETH 200-200-20 MG/5ML PO SUSP: 30.0000 mL | ORAL | Status: DC | PRN

## 2022-09-17 MED ORDER — ACETAMINOPHEN 325 MG PO TABS
650.0000 mg | ORAL_TABLET | Freq: Four times a day (QID) | ORAL | Status: DC | PRN
  Administered 2022-09-17: 650 mg via ORAL
  Filled 2022-09-17: qty 2

## 2022-09-17 MED ORDER — HYDROXYZINE HCL 25 MG PO TABS
25.0000 mg | ORAL_TABLET | Freq: Three times a day (TID) | ORAL | Status: DC | PRN
  Administered 2022-09-17: 25 mg via ORAL
  Filled 2022-09-17: qty 1

## 2022-09-17 MED ORDER — MELATONIN 5 MG PO TABS: 5.0000 mg | ORAL_TABLET | Freq: Every evening | ORAL | Status: DC | PRN

## 2022-09-17 MED ORDER — TRAZODONE HCL 50 MG PO TABS
50.0000 mg | ORAL_TABLET | Freq: Every evening | ORAL | Status: DC | PRN
  Administered 2022-09-17: 50 mg via ORAL
  Filled 2022-09-17: qty 1

## 2022-09-17 MED ORDER — MAGNESIUM HYDROXIDE 400 MG/5ML PO SUSP: 30.0000 mL | Freq: Every day | ORAL | Status: DC | PRN

## 2022-09-17 NOTE — ED Notes (Signed)
Pt A&O x 4, no distress noted, presents with suicidal ideations, no plan noted.  Pt sad & tearful, pleasant.  Calm & cooperative. Contracts for safety.  Monitoring for safety.

## 2022-09-17 NOTE — ED Notes (Signed)
Pt sleeping at present, no distress noted.  Monitoring for safety. 

## 2022-09-17 NOTE — ED Provider Notes (Signed)
[facility] Urgent Care Continuous Assessment Admission H&P  Date: 09/17/22 Patient Name: Whitney Beard MRN: [id number] Chief Complaint: "I'm having really bad depression and suicidal thoughts". Chief Complaint  Patient presents with   Depression   Suicidal      Diagnoses:  Final diagnoses:  Severe episode of recurrent major depressive disorder, without psychotic features (HCC)    HPI: Whitney Beard is a 21 year old female with past psychiatric history of SI, intentional overdose, bipolar 1 disorder, MDD, anxiety disorder, cannabis use disorder, alcohol use, and borderline personality disorder who presented voluntarily as a walk-in to [facility] with complaints of depression, anxiety, and suicidal thoughts.  Per chart review, the patient has a documented history of suicidal ideation, and suicide attempts with intentional drug overdoses in February 2020, April 2020, July 2021 and December 2021.  However patient self reports a history of suicidal attempts via overdose at least 10 times.  Patient reports she has been admitted inpatient at the [facility] about 13 times in the past, and prefers not to return there due to her last experience.   Patient reports "I had a plan of taking a bottle of pills 3 days ago, but I could not go through with it".  "I have had really bad depression, anxiety, and suicidal thoughts and I've been hurting myself on my left arm. Assessment shows patient has new and old superficial lacerations on the left forearm/wrist. No active bleeding noted.    Patient reports she started feeling really depressed 4 months ago and her depression worsened between September and October.  Patient reports she is unsure about her stressors, but she is having a lot of family issues, and sometimes feels like her family hates her, and she and her 8 siblings are not on talking terms.  Patient also alludes to financial problems, which makes her feel like she is not where she needs to be in life, leading to self-hate.    Patient reports "I could be talking and laughing about stuff to people but inside I don't feel good".  Patient denies HI, denies AVH or paranoia.  Patient reports she lives alone, and denies access or means to a gun or weapon.  Patient reports she has family in the area but they are not supportive.  Patient reports she does not have a good relationship with her mother.  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  Patient reports she is not currently seeing a psychiatrist or therapist, but is open to restart treatment for depression.  Patient reports she was prescribed an antidepressant and other med to help her mood in the past, however the medication did not improve her mood, so she stopped taking the medicine at age 46. Patient is unable to recall the names of the medications.  Chart review shows the patient was prescribed Sertraline 25 mg daily for depression and Latuda 40 mg daily for mood control during her last inpatient admission at the [facility] from 11/11/20-11/15/20.  Will restart sertraline this encounter.   Patient reports daily THC and daily vape use.  Patient reports taking 3 shots of alcohol today.  Patient reports recently, she has been drinking about half a bottle of alcohol daily.  Patient reports her sleep and appetite as poor.  Support encouragement and reassurance provided about ongoing stressors.  Patient provided with opportunity for questions.  On evaluation, patient is alert, oriented x 4, and cooperative. Speech is clear and coherent. Pt appears casual. Eye contact is  good. Mood is anxious and depressed, affect is tearful and congruent with mood. Thought process and thought content is coherent. Pt endorses active SI, denies /HI/AVH. There is no indication that the patient is responding to internal stimuli. No delusions elicited during this assessment.    PHQ 2-9:   Flowsheet [facility]Discharged) [facility] from 12/01/2021 in [facility] EMERGENCY DEPARTMENT  C-SSRS RISK CATEGORY Moderate Risk No Risk No Risk        Total Time spent with patient: 20 minutes  Musculoskeletal  Strength & Muscle Tone: within normal limits Gait & Station: normal Patient leans: N/A  Psychiatric Specialty Exam  Presentation General Appearance:  Casual  Eye Contact: Good  Speech: Clear and Coherent  Speech Volume: Normal  Handedness: Right   Mood and Affect  Mood: Depressed; Anxious  Affect: Depressed; Tearful   Thought Process  Thought Processes: Coherent  Descriptions of Associations:Intact  Orientation:Full (Time, Place and Person)  Thought Content:WDL  Diagnosis of Schizophrenia or Schizoaffective disorder in past: No   Hallucinations:Hallucinations: None  Ideas of Reference:None  Suicidal Thoughts:Suicidal Thoughts: Yes, Active SI Active Intent and/or Plan: With Plan  Homicidal Thoughts:Homicidal Thoughts: No   Sensorium  Memory: Immediate Good  Judgment: Fair  Insight: Fair   Executive Functions  Concentration: Good  Attention Span: Good  Recall: Fair  Fund of Knowledge: Fair  Language: Good   Psychomotor Activity  Psychomotor Activity: Psychomotor Activity: Normal   Assets  Assets: Communication Skills; Vocational/Educational; Desire for Improvement; Financial Resources/Insurance; Resilience   Sleep  Sleep: Sleep: Poor   Nutritional Assessment (For OBS and FBC admissions only) Has the patient had a weight loss or gain of 10 pounds or more in the last 3 months?: No Has the patient had a decrease in food intake/or appetite?: No Does the patient have dental problems?: No Does the patient have eating habits or behaviors that may be indicators of an eating disorder including binging or inducing vomiting?: No Has the  patient recently lost weight without trying?: 0 Has the patient been eating poorly because of a decreased appetite?: 0 Malnutrition Screening Tool Score: 0    Physical Exam Constitutional:      General: She is not in acute distress.    Appearance: She is obese.  HENT:     Head: Normocephalic.     Right Ear: External ear normal.     Left Ear: External ear normal.     Nose: No congestion.  Eyes:     General:        Right eye: No discharge.        Left eye: No discharge.  Cardiovascular:     Rate and Rhythm: Normal rate.  Pulmonary:     Effort: Pulmonary effort is normal. No respiratory distress.  Chest:     Chest wall: No tenderness.  Neurological:     Mental Status: She is alert and oriented to person, place, and time.  Psychiatric:        Attention and Perception: Attention and perception normal.        Mood and Affect: Mood is anxious and depressed. Affect is tearful.        Speech: Speech normal.        Behavior: Behavior is cooperative.        Thought Content: Thought content is not paranoid or delusional. Thought content includes suicidal  ideation. Thought content does not include homicidal ideation. Thought content includes suicidal plan. Thought content does not include homicidal plan.        Cognition and Memory: Cognition and memory normal.        Judgment: Judgment is impulsive.    Review of Systems  Constitutional:  Negative for chills, diaphoresis and fever.  HENT:  Negative for congestion.   Eyes:  Negative for discharge.  Respiratory:  Negative for cough, hemoptysis and wheezing.   Cardiovascular:  Negative for chest pain and palpitations.  Gastrointestinal:  Negative for diarrhea, nausea and vomiting.  Neurological:  Negative for seizures, loss of consciousness, weakness and headaches.  Psychiatric/Behavioral:  Positive for depression and suicidal ideas. Negative for hallucinations and substance abuse. The patient is nervous/anxious.     Blood pressure  (!) 146/99, pulse 92, temperature 98.6 F (37 C), temperature source Oral, resp. rate 20, SpO2 100 %. There is no height or weight on file to calculate BMI.  Past Psychiatric History: See H & P   Is the patient at risk to self? Yes  Has the patient been a risk to self in the past 6 months? Yes .    Has the patient been a risk to self within the distant past? Yes   Is the patient a risk to others? No   Has the patient been a risk to others in the past 6 months? No   Has the patient been a risk to others within the distant past? No   Past Medical History:  Past Medical History:  Diagnosis Date   ADHD (attention deficit hyperactivity disorder)    Allergic rhinitis    Asthma    Bipolar 1 disorder (HCC)    Diabetes mellitus without complication (HCC)    states today 11/11/20 "was told in the past that she was pre- diabetic"   Obesity    Sleep apnea    Suicide attempt [facility])    Suicide attempt by drug ingestion [facility])     Past Surgical History:  Procedure Laterality Date   ADENOIDECTOMY     TONSILLECTOMY      Family History:  Family History  Problem Relation Age of Onset   Bipolar disorder Father    Schizophrenia Father    Liver disease Father    Asthma Father    Asthma Maternal Grandmother    Schizophrenia Maternal Grandmother    SIDS Maternal Aunt    Heart disease Paternal Grandmother    Diabetes Paternal Grandmother    Seizures Paternal Grandmother     Social History:  Social History   Socioeconomic History   Marital status: Single    Spouse name: Not on file   Number of children: Not on file   Years of education: Not on file   Highest education level: High school graduate  Occupational History   Not on file  Tobacco Use   Smoking status: Every Day    Packs/day: 0.50    Types: Cigarettes   Smokeless tobacco: Never   Tobacco comments:    vapes   Vaping Use   Vaping Use: Every day   Substances: Nicotine  Substance and Sexual Activity   Alcohol use: Yes     Comment: 1-3 times a week    Drug use: Yes    Types: Marijuana   Sexual activity: Never    Birth control/protection: None  Other Topics Concern   Not on file  Social History Narrative   Lives with 19y/o sister. Is one  of 8 siblings.   Social Determinants of Health   Financial Resource Strain: Not on file  Food Insecurity: Not on file  Transportation Needs: Not on file  Physical Activity: Not on file  Stress: Not on file  Social Connections: Not on file  Intimate Partner Violence: Not on file    SDOH:  SDOH Screenings   Alcohol Screen: Low Risk  (11/11/2020)  Tobacco Use: High Risk (03/03/2022)    Last Labs:  No visits with results within 6 Month(s) from this visit.  Latest known visit with results is:  Admission on 03/02/2022, Discharged on 03/05/2022  Component Date Value Ref Range Status   WBC 03/02/2022 13.5 (H)  4.0 - 10.5 K/uL Final   RBC 03/02/2022 4.07  3.87 - 5.11 MIL/uL Final   Hemoglobin 03/02/2022 12.7  12.0 - 15.0 g/dL Final   HCT 32/35/5732 37.9  36.0 - 46.0 % Final   MCV 03/02/2022 93.1  80.0 - 100.0 fL Final   MCH 03/02/2022 31.2  26.0 - 34.0 pg Final   MCHC 03/02/2022 33.5  30.0 - 36.0 g/dL Final   RDW 20/25/4270 12.9  11.5 - 15.5 % Final   Platelets 03/02/2022 295  150 - 400 K/uL Final   nRBC 03/02/2022 0.0  0.0 - 0.2 % Final   Neutrophils Relative % 03/02/2022 87  % Final   Neutro Abs 03/02/2022 11.6 (H)  1.7 - 7.7 K/uL Final   Lymphocytes Relative 03/02/2022 10  % Final   Lymphs Abs 03/02/2022 1.3  0.7 - 4.0 K/uL Final   Monocytes Relative 03/02/2022 2  % Final   Monocytes Absolute 03/02/2022 0.3  0.1 - 1.0 K/uL Final   Eosinophils Relative 03/02/2022 1  % Final   Eosinophils Absolute 03/02/2022 0.1  0.0 - 0.5 K/uL Final   Basophils Relative 03/02/2022 0  % Final   Basophils Absolute 03/02/2022 0.1  0.0 - 0.1 K/uL Final   Immature Granulocytes 03/02/2022 0  % Final   Abs Immature Granulocytes 03/02/2022 0.05  0.00 - 0.07 K/uL Final   Performed  at [facility], [address] [place], [place] [address]   Sodium 03/02/2022 138  135 - 145 mmol/L Final   Potassium 03/02/2022 4.2  3.5 - 5.1 mmol/L Final   Chloride 03/02/2022 110  98 - 111 mmol/L Final   CO2 03/02/2022 21 (L)  22 - 32 mmol/L Final   Glucose, Bld 03/02/2022 101 (H)  70 - 99 mg/dL Final   Glucose reference range applies only to samples taken after fasting for at least 8 hours.   BUN 03/02/2022 13  6 - 20 mg/dL Final   Creatinine, Ser 03/02/2022 0.80  0.44 - 1.00 mg/dL Final   Calcium 28/31/5176 8.8 (L)  8.9 - 10.3 mg/dL Final   GFR, Estimated 03/02/2022 >60  >60 mL/min Final   Comment: (NOTE) Calculated using the CKD-EPI Creatinine Equation (2021)    Anion gap 03/02/2022 7  5 - 15 Final   Performed at [facility], [address] [place], [place] [address]   SARS Coronavirus 2 by RT PCR 03/03/2022 NEGATIVE  NEGATIVE Final   Comment: (NOTE) SARS-CoV-2 target nucleic acids are NOT DETECTED.  The SARS-CoV-2 RNA is generally detectable in upper respiratory specimens during the acute phase of infection. The lowest concentration of SARS-CoV-2 viral copies this assay can detect is 138 copies/mL. A negative result does not preclude SARS-Cov-2 infection and should not be used as the sole basis for treatment or  other patient management decisions. A negative result may occur with  improper specimen collection/handling, submission of specimen other than nasopharyngeal swab, presence of viral mutation(s) within the areas targeted by this assay, and inadequate number of viral copies(<138 copies/mL). A negative result must be combined with clinical observations, patient history, and epidemiological information. The expected result is Negative.  Fact Sheet for Patients:  EntrepreneurPulse.com.au  Fact Sheet for Healthcare Providers:  IncredibleEmployment.be  This test is no                          t yet approved or cleared  by the [place] FDA and  has been authorized for detection and/or diagnosis of SARS-CoV-2 by FDA under an Emergency Use Authorization (EUA). This EUA will remain  in effect (meaning this test can be used) for the duration of the COVID-19 declaration under Section 564(b)(1) of the Act, 21 U.S.C.section 360bbb-3(b)(1), unless the authorization is terminated  or revoked sooner.       Influenza A by PCR 03/03/2022 NEGATIVE  NEGATIVE Final   Influenza B by PCR 03/03/2022 NEGATIVE  NEGATIVE Final   Comment: (NOTE) The Xpert Xpress SARS-CoV-2/FLU/RSV plus assay is intended as an aid in the diagnosis of influenza from Nasopharyngeal swab specimens and should not be used as a sole basis for treatment. Nasal washings and aspirates are unacceptable for Xpert Xpress SARS-CoV-2/FLU/RSV testing.  Fact Sheet for Patients: EntrepreneurPulse.com.au  Fact Sheet for Healthcare Providers: IncredibleEmployment.be  This test is not yet approved or cleared by the [place] FDA and has been authorized for detection and/or diagnosis of SARS-CoV-2 by FDA under an Emergency Use Authorization (EUA). This EUA will remain in effect (meaning this test can be used) for the duration of the COVID-19 declaration under Section 564(b)(1) of the Act, 21 U.S.C. section 360bbb-3(b)(1), unless the authorization is terminated or revoked.  Performed at [facility], [place] [address] [place], [place] [address]    Glucose-Capillary 03/03/2022 135 (H)  70 - 99 mg/dL Final   Glucose reference range applies only to samples taken after fasting for at least 8 hours.   HIV Screen 4th Generation wRfx 03/04/2022 Non Reactive  Non Reactive Final   Performed at [facility], [place] [address] [place], [place] [address]   Glucose-Capillary 03/03/2022 118 (H)  70 - 99 mg/dL Final   Glucose reference range applies only to samples taken after fasting for at least 8 hours.    Glucose-Capillary 03/03/2022 134 (H)  70 - 99 mg/dL Final   Glucose reference range applies only to samples taken after fasting for at least 8 hours.   Glucose-Capillary 03/04/2022 130 (H)  70 - 99 mg/dL Final   Glucose reference range applies only to samples taken after fasting for at least 8 hours.   Glucose-Capillary 03/04/2022 102 (H)  70 - 99 mg/dL Final   Glucose reference range applies only to samples taken after fasting for at least 8 hours.   Glucose-Capillary 03/04/2022 122 (H)  70 - 99 mg/dL Final   Glucose reference range applies only to samples taken after fasting for at least 8 hours.   Hgb A1c MFr Bld 03/05/2022 4.8  4.8 - 5.6 % Final   Comment: (NOTE) Pre diabetes:          5.7%-6.4%  Diabetes:              >6.4%  Glycemic control for   <7.0% adults with diabetes  Mean Plasma Glucose 03/05/2022 91.06  mg/dL Final   Performed at [facility], [address] [place], [place] [address]   Glucose-Capillary 03/04/2022 129 (H)  70 - 99 mg/dL Final   Glucose reference range applies only to samples taken after fasting for at least 8 hours.   Comment 1 03/04/2022 Notify RN   Final   Comment 2 03/04/2022 Document in Chart   Final   Glucose-Capillary 03/05/2022 153 (H)  70 - 99 mg/dL Final   Glucose reference range applies only to samples taken after fasting for at least 8 hours.    Allergies: Other  PTA Medications: (Not in a hospital admission)  Prior to Admission medications   Medication Sig Start Date End Date Taking? Authorizing Provider  albuterol (PROVENTIL) (2.5 MG/3ML) 0.083% nebulizer solution Take 3 mLs (2.5 mg total) by nebulization every 6 (six) hours as needed for wheezing or shortness of breath. 03/05/22   Rai, Ripudeep K, MD  albuterol (VENTOLIN HFA) 108 (90 Base) MCG/ACT inhaler Inhale 2 puffs into the lungs every 4 (four) hours as needed for wheezing or shortness of breath. 03/05/22   Rai, Delene Ruffini, MD  benzonatate (TESSALON PERLES) 100 MG capsule Take  1 capsule (100 mg total) by mouth 3 (three) times daily as needed for cough. 03/05/22   Rai, Delene Ruffini, MD  hydrOXYzine (ATARAX/VISTARIL) 25 MG tablet Take 1 tablet (25 mg total) by mouth every 6 (six) hours as needed for anxiety. 11/15/20   Armandina Stammer I, NP  mometasone-formoterol (DULERA) 200-5 MCG/ACT AERO Inhale 2 puffs into the lungs 2 (two) times daily. 03/05/22 04/04/22  Rai, Ripudeep K, MD  montelukast (SINGULAIR) 10 MG tablet Take 1 tablet (10 mg total) by mouth at bedtime. 03/05/22   Rai, Delene Ruffini, MD  predniSONE (DELTASONE) 10 MG tablet Prednisone dosing: Take  Prednisone  (4 tabs) x 3 days, then taper to  (3 tabs) x 3 days, then  (2 tabs) x 3days, then  (1 tab) x 3days, then OFF. 03/05/22   Rai, Delene Ruffini, MD    Medical Decision Making  Recommend inpatient psychiatric admission due to severe depression and active SI with plan.  Patient admitted to continuous observation unit overnight for safety monitoring due to unavailability of appropriate bed space at [facility] tonight. SW will follow up.  Lab Orders         Resp Panel by RT-PCR (Flu A&B, Covid) Anterior Nasal Swab         CBC with Differential/Platelet         Comprehensive metabolic panel         Hemoglobin A1c         Pregnancy, urine         Lipid panel         TSH         POCT Urine Drug Screen - (I-Screen)         POC SARS Coronavirus 2 Ag      Home medications restarted tonight -Ventolin HFA, 2 puffs, inhalation, every 4 hours as needed, wheezing, shortness of breath-asthma -Sertraline 25 mg p.o. nightly MDD  Other PRNs -Tylenol 650 mg p.o. every 6 hours as needed pain -Mylanta 30 mL p.o. every 4 hours as needed indigestion -Atarax 25 mg p.o. 3 times daily as needed anxiety -MOM 30 mL p.o. daily as needed constipation -Melatonin 5 mg p.o. nightly as needed sleep  Recommendations  Based on my evaluation the patient does not appear to have an  emergency medical condition.  Recommend inpatient psychiatric  admission for crisis stabilization, safety monitoring, medication management.  Mancel Bale, NP 09/17/22  8:31 PM
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
## 2022-09-17 NOTE — ED Notes (Signed)
Phone DASH pickup called and notified of pending collected STAT Labs to be transported to [facility] 

## 2022-09-18 ENCOUNTER — Encounter (HOSPITAL_COMMUNITY): Payer: Self-pay | Admitting: Emergency Medicine

## 2022-09-18 MED ORDER — QUETIAPINE FUMARATE 50 MG PO TABS: 50.0000 mg | ORAL_TABLET | Freq: Every day | ORAL | Status: DC

## 2022-09-18 NOTE — Progress Notes (Signed)
Pt was accepted to [facility] Today 09/18/22; Bed Assignment [place] Unit  Pt meets inpatient criteria per Tharon Aquas, NP  Attending Physician will be Cammie Mcgee, NP  Report can be called to: [phone]  Pt can arrive after: 3:00pm  Please fax IVC paperwork to [phone]   Care Team notified: Tharon Aquas, NP, Drema Halon, RN, [place], Luanna Salk, RN, [place], RN  [place], [place] 09/18/2022 @ 9:52 AM

## 2022-09-18 NOTE — ED Notes (Signed)
Patient resting in bed with no acute distress, sleeping. Patient is safe.

## 2022-09-18 NOTE — ED Notes (Signed)
Report called to [place] on [place] unit.

## 2022-09-18 NOTE — Progress Notes (Signed)
Inpatient Behavioral Health Placement   Pt meets inpatient criteria per Tharon Aquas, NP. Pt prefers not to admit to [facility] [place] per provider. Referral was sent to the following facilities;    Destination Service Provider Address Phone Fax  [facility]  [address] [place] [address] [phone] [place]  [place], [place] [address] [phone]  [facility]  [address] [place] [address] [place]  [facility]  [address], [place] [address] [phone]  [facility] [place] [address] [phone]  [facility]  [place] [address] [phone]  [facility]  [address] [place] [address] [phone] [facility]  [address], [place] [address] [phone]  [facility]  [address], [place] [address] [phone] [facility]  [address] [place] [address] [phone]  [place]  [address] [place]  [address] [phone]   Situation ongoing,  CSW will follow up.   Benjaman Kindler, MSW, LCSWA 09/18/2022  @ 9:18 AM

## 2022-09-18 NOTE — ED Notes (Signed)
Pt sleeping at present, no distress noted.  Monitoring for safety. 

## 2022-09-18 NOTE — ED Provider Notes (Signed)
FBC/OBS ASAP Discharge Summary  Date and Time: 09/18/2022 12:02 PM  Name: Whitney Beard  MRN:  [id number]   Discharge Diagnoses:  Final diagnoses:  Severe episode of recurrent major depressive disorder, without psychotic features (HCC)   Subjective:   On reassessment, pt reports anxious, depressed mood. She reports passive SI. She is unable to verbally contract to safety. She reports this past Wednesday, she experienced active SI w/ plan, plan was to OD on pills, she did have pills with her, although stopped herself from taking it. She states she had realization that dying would hurt herself most. She states she believes she needs inpatient psychiatric hospitalization for stabilization. She denies HI/VI, AVH, paranoia.   Pt reports prior dxs of bipolar disorder and borderline personality disorder. She states she has episodes of mania that last 7 or more days when she experiences grandiosity ("feel like I'm really a god"), has decreased need for sleep (feeling rested after sleeping 1 or 2 hours a night), is more talkative than usual, is more distractible, going out every night, spending money she does not have, drinking a lot more. Pt states she was previously prescribed latuda and zoloft. Zoloft was restarted last night. Pt does not want to start latuda. She is willing to consider quetiapine. Discussed indications, SE/AE, black box warning of quetiapine. Pt gave verbal consent to start quetiapine 50mg . Discussed continued recommendation for inpatient psychiatric admission. Pt requesting to not be admitted to [facility].   Pt has been accepted to [facility] for inpatient psychiatric admission. Pt informed and she agrees for transfer to take place today.   Stay Summary:  Pt is a 21 y/o female w/ reported hx of bipolar disorder and borderline personality disorder presenting to [facility] on 09/17/22 w/ complaints of depression, anxiety, and suicidal thoughts. Pt recommended for inpatient psychiatric admission  and has been accepted to [facility] for inpatient psychiatric admission.  Total Time spent with patient: 30 minutes  Past Psychiatric History:  Past Medical History:  Past Medical History:  Diagnosis Date   ADHD (attention deficit hyperactivity disorder)    Allergic rhinitis    Asthma    Bipolar 1 disorder (HCC)    Diabetes mellitus without complication (HCC)    states today 11/11/20 "was told in the past that she was pre- diabetic"   Obesity    Sleep apnea    Suicide attempt [facility])    Suicide attempt by drug ingestion [facility])     Past Surgical History:  Procedure Laterality Date   ADENOIDECTOMY     TONSILLECTOMY     Family History:  Family History  Problem Relation Age of Onset   Bipolar disorder Father    Schizophrenia Father    Liver disease Father    Asthma Father    Asthma Maternal Grandmother    Schizophrenia Maternal Grandmother    SIDS Maternal Aunt    Heart disease Paternal Grandmother    Diabetes Paternal Grandmother    Seizures Paternal Grandmother    Family Psychiatric History: Pt reports maternal grandmother carries dx of schizophrenia; paternal cousin carries dx of schizophrenia; father carries dx of bipolar disorder; mother carries dx of depression, anxiety; sister carries dx of borderline personality disorder Social History:  Social History   Substance and Sexual Activity  Alcohol Use Yes   Comment: 1-3 times a week      Social History   Substance and Sexual Activity  Drug Use Yes   Types: Marijuana    Social History   Socioeconomic  History   Marital status: Single    Spouse name: Not on file   Number of children: Not on file   Years of education: Not on file   Highest education level: High school graduate  Occupational History   Not on file  Tobacco Use   Smoking status: Every Day    Packs/day: 0.50    Types: Cigarettes   Smokeless tobacco: Never   Tobacco comments:    vapes   Vaping Use   Vaping Use: Every day   Substances:  Nicotine  Substance and Sexual Activity   Alcohol use: Yes    Comment: 1-3 times a week    Drug use: Yes    Types: Marijuana   Sexual activity: Never    Birth control/protection: None  Other Topics Concern   Not on file  Social History Narrative   Lives with 19y/o sister. Is one of 8 siblings.   Social Determinants of Health   Financial Resource Strain: Not on file  Food Insecurity: Not on file  Transportation Needs: Not on file  Physical Activity: Not on file  Stress: Not on file  Social Connections: Not on file   SDOH:  SDOH Screenings   Alcohol Screen: Low Risk  (11/11/2020)  Tobacco Use: High Risk (09/18/2022)    Tobacco Cessation:  A prescription for an FDA-approved tobacco cessation medication was offered at discharge and the patient refused  Current Medications:  Current Facility-Administered Medications  Medication Dose Route Frequency Provider Last Rate Last Admin   acetaminophen (TYLENOL) tablet 650 mg  650 mg Oral Q6H PRN Onuoha, Chinwendu V, NP   650 mg at 09/17/22 2221   albuterol (VENTOLIN HFA) 108 (90 Base) MCG/ACT inhaler 2 puff  2 puff Inhalation Q4H PRN Onuoha, Chinwendu V, NP   2 puff at 09/17/22 2221   alum & mag hydroxide-simeth (MAALOX/MYLANTA) 200-200-20 MG/67ML suspension 30 mL  30 mL Oral Q4H PRN Onuoha, Chinwendu V, NP       hydrOXYzine (ATARAX) tablet 25 mg  25 mg Oral TID PRN Onuoha, Chinwendu V, NP   25 mg at 09/17/22 2156   magnesium hydroxide (MILK OF MAGNESIA) suspension 30 mL  30 mL Oral Daily PRN Onuoha, Chinwendu V, NP       melatonin tablet 5 mg  5 mg Oral QHS PRN Onuoha, Chinwendu V, NP       QUEtiapine (SEROQUEL) tablet 50 mg  50 mg Oral QHS Lauree Chandler, NP       sertraline (ZOLOFT) tablet 25 mg  25 mg Oral QHS Onuoha, Chinwendu V, NP   25 mg at 09/17/22 2253   Current Outpatient Medications  Medication Sig Dispense Refill   acetaminophen (TYLENOL) 500 MG tablet Take 2,000 mg by mouth every 6 (six) hours as needed for headache.      albuterol (VENTOLIN HFA) 108 (90 Base) MCG/ACT inhaler Inhale 2 puffs into the lungs every 4 (four) hours as needed for wheezing or shortness of breath. 18 g 4   montelukast (SINGULAIR) 10 MG tablet Take 1 tablet (10 mg total) by mouth at bedtime. (Patient taking differently: Take 10 mg by mouth at bedtime as needed (For allergies).) 30 tablet 3    PTA Medications: (Not in a hospital admission)       No data to display          Flowsheet Row ED from 09/17/2022 in [facility] ED to Hosp-Admission (Discharged) from 03/02/2022 in [facility] 66M KIDNEY UNIT  ED from 12/01/2021 in [place] CATEGORY Moderate Risk No Risk No Risk       Musculoskeletal  Strength & Muscle Tone: within normal limits Gait & Station: normal Patient leans: N/A  Psychiatric Specialty Exam  Presentation  General Appearance:  Casual  Eye Contact: Good  Speech: Clear and Coherent; Normal Rate  Speech Volume: Normal  Handedness: Right   Mood and Affect  Mood: Depressed; Anxious  Affect: Blunt   Thought Process  Thought Processes: Coherent; Goal Directed; Linear  Descriptions of Associations:Intact  Orientation:Full (Time, Place and Person)  Thought Content:Logical  Diagnosis of Schizophrenia or Schizoaffective disorder in past: No    Hallucinations:Hallucinations: None  Ideas of Reference:None  Suicidal Thoughts:Suicidal Thoughts: Yes, Passive  Homicidal Thoughts:Homicidal Thoughts: No   Sensorium  Memory: Immediate Good  Judgment: Fair  Insight: Fair   Community education officer  Concentration: Good  Attention Span: Good  Recall: Good  Fund of Knowledge: Good  Language: Good   Psychomotor Activity  Psychomotor Activity: Psychomotor Activity: Normal   Assets  Assets: Communication Skills; Desire for Improvement; Financial Resources/Insurance; Housing; Social Support;  Vocational/Educational   Sleep  Sleep: Sleep: Poor   Nutritional Assessment (For OBS and FBC admissions only) Has the patient had a weight loss or gain of 10 pounds or more in the last 3 months?: No Has the patient had a decrease in food intake/or appetite?: No Does the patient have dental problems?: No Does the patient have eating habits or behaviors that may be indicators of an eating disorder including binging or inducing vomiting?: No Has the patient recently lost weight without trying?: 0 Has the patient been eating poorly because of a decreased appetite?: 0 Malnutrition Screening Tool Score: 0    Physical Exam  Physical Exam Cardiovascular:     Rate and Rhythm: Regular rhythm.  Pulmonary:     Effort: Pulmonary effort is normal.  Skin:    General: Skin is warm and dry.  Neurological:     Mental Status: She is alert and oriented to person, place, and time.  Psychiatric:        Attention and Perception: Attention and perception normal.        Mood and Affect: Mood is anxious and depressed. Affect is blunt.        Speech: Speech normal.        Behavior: Behavior normal. Behavior is cooperative.        Thought Content: Thought content includes suicidal ideation.        Cognition and Memory: Cognition and memory normal.    Review of Systems  Constitutional:  Negative for chills and fever.  Respiratory:  Negative for shortness of breath.   Cardiovascular:  Negative for chest pain and palpitations.  Gastrointestinal:  Negative for abdominal pain.  Neurological:  Negative for headaches.  Psychiatric/Behavioral:  Positive for depression and suicidal ideas. The patient is nervous/anxious.    Blood pressure 117/76, pulse (!) 56, temperature 97.7 F (36.5 C), temperature source Oral, resp. rate 16, SpO2 100 %. There is no height or weight on file to calculate BMI.  Demographic Factors:  Adolescent or young adult and Living alone  Loss Factors: Legal issues and Financial  problems/change in socioeconomic status  Historical Factors: Prior suicide attempts, Family history of mental illness or substance abuse, and Impulsivity  Risk Reduction Factors:   Employed and Positive social support  Continued Clinical Symptoms:  Previous Psychiatric Diagnoses and Treatments  Cognitive Features That  Contribute To Risk:  None    Suicide Risk:  Moderate:  Frequent suicidal ideation with limited intensity, and duration, some specificity in terms of plans, no associated intent, good self-control, limited dysphoria/symptomatology, some risk factors present, and identifiable protective factors, including available and accessible social support.  Plan Of Care/Follow-up recommendations:  Inpatient psychiatric admission  Disposition:  Inpatient psychiatric admission  Lauree Chandler, NP 09/18/2022, 12:02 PM

## 2022-12-23 ENCOUNTER — Emergency Department (HOSPITAL_COMMUNITY)
Admission: EM | Admit: 2022-12-23 | Discharge: 2022-12-23 | Disposition: A | Discharge status: Home / Self Care | Payer: Medicaid Other | Attending: Emergency Medicine | Admitting: Emergency Medicine

## 2022-12-23 ENCOUNTER — Emergency Department (HOSPITAL_COMMUNITY): Payer: Medicaid Other

## 2022-12-23 ENCOUNTER — Other Ambulatory Visit: Payer: Self-pay

## 2022-12-23 DIAGNOSIS — Z1152 Encounter for screening for COVID-19: Secondary | ICD-10-CM | POA: Insufficient documentation

## 2022-12-23 DIAGNOSIS — R0682 Tachypnea, not elsewhere classified: Secondary | ICD-10-CM | POA: Diagnosis not present

## 2022-12-23 DIAGNOSIS — R0602 Shortness of breath: Secondary | ICD-10-CM | POA: Diagnosis present

## 2022-12-23 DIAGNOSIS — J45901 Unspecified asthma with (acute) exacerbation: Secondary | ICD-10-CM

## 2022-12-23 DIAGNOSIS — J4551 Severe persistent asthma with (acute) exacerbation: Secondary | ICD-10-CM

## 2022-12-23 LAB — CBC WITH DIFFERENTIAL/PLATELET
Abs Immature Granulocytes: 0.01 10*3/uL (ref 0.00–0.07)
Basophils Absolute: 0 10*3/uL (ref 0.0–0.1)
Basophils Relative: 1 %
Eosinophils Absolute: 0.2 10*3/uL (ref 0.0–0.5)
Eosinophils Relative: 4 %
HCT: 34.7 % — ABNORMAL LOW (ref 36.0–46.0)
Hemoglobin: 11.5 g/dL — ABNORMAL LOW (ref 12.0–15.0)
Immature Granulocytes: 0 %
Lymphocytes Relative: 39 %
Lymphs Abs: 2.3 10*3/uL (ref 0.7–4.0)
MCH: 31.8 pg (ref 26.0–34.0)
MCHC: 33.1 g/dL (ref 30.0–36.0)
MCV: 95.9 fL (ref 80.0–100.0)
Monocytes Absolute: 0.6 10*3/uL (ref 0.1–1.0)
Monocytes Relative: 10 %
Neutro Abs: 2.7 10*3/uL (ref 1.7–7.7)
Neutrophils Relative %: 46 %
Platelets: 262 10*3/uL (ref 150–400)
RBC: 3.62 MIL/uL — ABNORMAL LOW (ref 3.87–5.11)
RDW: 12.9 % (ref 11.5–15.5)
WBC: 5.8 10*3/uL (ref 4.0–10.5)
nRBC: 0 % (ref 0.0–0.2)

## 2022-12-23 LAB — BASIC METABOLIC PANEL
Anion gap: 9 (ref 5–15)
BUN: 11 mg/dL (ref 6–20)
CO2: 23 mmol/L (ref 22–32)
Calcium: 8.2 mg/dL — ABNORMAL LOW (ref 8.9–10.3)
Chloride: 108 mmol/L (ref 98–111)
Creatinine, Ser: 0.74 mg/dL (ref 0.44–1.00)
GFR, Estimated: >60 mL/min (ref >60)
Glucose, Bld: 115 mg/dL — ABNORMAL HIGH (ref 70–99)
Potassium: 3.4 mmol/L — ABNORMAL LOW (ref 3.5–5.1)
Sodium: 140 mmol/L (ref 135–145)

## 2022-12-23 LAB — RESP PANEL BY RT-PCR (RSV, FLU A&B, COVID)  RVPGX2
Influenza A by PCR: NEGATIVE
Influenza B by PCR: NEGATIVE
Resp Syncytial Virus by PCR: NEGATIVE
SARS Coronavirus 2 by RT PCR: NEGATIVE

## 2022-12-23 MED ORDER — BENZONATATE 100 MG PO CAPS: 100.0000 mg | ORAL_CAPSULE | Freq: Three times a day (TID) | ORAL | 0 refills | Status: AC

## 2022-12-23 MED ORDER — MAGNESIUM SULFATE 2 GM/50ML IV SOLN
2.0000 g | Freq: Once | INTRAVENOUS | Status: AC
  Administered 2022-12-23: 2 g via INTRAVENOUS
  Filled 2022-12-23: qty 50

## 2022-12-23 MED ORDER — ALBUTEROL SULFATE (2.5 MG/3ML) 0.083% IN NEBU: 2.5000 mg | INHALATION_SOLUTION | RESPIRATORY_TRACT | 0 refills | Status: DC | PRN

## 2022-12-23 MED ORDER — POTASSIUM CHLORIDE CRYS ER 20 MEQ PO TBCR
40.0000 meq | EXTENDED_RELEASE_TABLET | Freq: Once | ORAL | Status: AC
  Administered 2022-12-23: 40 meq via ORAL
  Filled 2022-12-23: qty 2

## 2022-12-23 MED ORDER — ALBUTEROL SULFATE (2.5 MG/3ML) 0.083% IN NEBU
20.0000 mg/h | INHALATION_SOLUTION | RESPIRATORY_TRACT | Status: DC
  Administered 2022-12-23: 20 mg/h via RESPIRATORY_TRACT
  Filled 2022-12-23: qty 12

## 2022-12-23 MED ORDER — COMPRESSOR/NEBULIZER MISC: 1.0000 | 0 refills | Status: DC | PRN

## 2022-12-23 MED ORDER — ALBUTEROL SULFATE HFA 108 (90 BASE) MCG/ACT IN AERS: 2.0000 | INHALATION_SPRAY | RESPIRATORY_TRACT | 0 refills | Status: DC | PRN

## 2022-12-23 MED ORDER — ACETAMINOPHEN 500 MG PO TABS: 500.0000 mg | ORAL_TABLET | Freq: Four times a day (QID) | ORAL | Status: DC | PRN

## 2022-12-23 MED ORDER — DM-GUAIFENESIN ER 30-600 MG PO TB12: 1.0000 | ORAL_TABLET | Freq: Two times a day (BID) | ORAL | 0 refills | Status: AC

## 2022-12-23 MED ORDER — MONTELUKAST SODIUM 10 MG PO TABS: 10.0000 mg | ORAL_TABLET | Freq: Every day | ORAL | 3 refills | Status: DC

## 2022-12-23 MED ORDER — PREDNISONE 10 MG PO TABS: ORAL_TABLET | ORAL | 0 refills | Status: DC

## 2022-12-23 MED ORDER — ALBUTEROL SULFATE (2.5 MG/3ML) 0.083% IN NEBU: 2.5000 mg | INHALATION_SOLUTION | RESPIRATORY_TRACT | Status: DC | PRN

## 2022-12-23 NOTE — ED Notes (Signed)
Pt O2 sat > 98% RA and HR went up to 125 bpm while ambulating. Steady gait. No complaints.

## 2022-12-23 NOTE — ED Provider Notes (Signed)
[facility] Provider Note   CSN: [id number] Arrival date & time: 12/23/22  0932     History Chief Complaint  Patient presents with   Shortness of Breath    HPI Whitney Beard is a 22 y.o. female presenting for chief complaint shortness of breath.  She has a history of asthma and obesity.  States that she has had upper respiratory symptoms for the last 3 days and then last night became acutely dyspneic after work.  She used her home albuterol inhaler all 50 puffs overnight without symptomatic resolution.  EMS was called due to dyspnea.  She was given 125 Solu-Medrol, DuoNeb x 2. She denies intubation in the past. No known sick contacts.  Patient's recorded medical, surgical, social, medication list and allergies were reviewed in the Snapshot window as part of the initial history.   Review of Systems   Review of Systems  Constitutional:  Negative for chills and fever.  HENT:  Positive for congestion. Negative for ear pain and sore throat.   Eyes:  Negative for pain and visual disturbance.  Respiratory:  Positive for cough, shortness of breath and wheezing.   Cardiovascular:  Negative for chest pain and palpitations.  Gastrointestinal:  Negative for abdominal pain and vomiting.  Genitourinary:  Negative for dysuria and hematuria.  Musculoskeletal:  Negative for arthralgias and back pain.  Skin:  Negative for color change and rash.  Neurological:  Negative for seizures and syncope.  All other systems reviewed and are negative.   Physical Exam Updated Vital Signs BP (!) 154/85   Pulse (!) 108   Temp 98.1 F (36.7 C) (Oral)   Resp (!) 28   Ht 5\' 6"  (1.676 m)   Wt 122.5 kg   SpO2 99%   BMI 43.58 kg/m  Physical Exam Vitals and nursing note reviewed.  Constitutional:      General: She is not in acute distress.    Appearance: She is well-developed.  HENT:     Head: Normocephalic and atraumatic.  Eyes:     Conjunctiva/sclera:  Conjunctivae normal.  Cardiovascular:     Rate and Rhythm: Normal rate and regular rhythm.     Heart sounds: No murmur heard. Pulmonary:     Effort: Respiratory distress present.     Breath sounds: Wheezing and rhonchi present.  Abdominal:     General: There is no distension.     Palpations: Abdomen is soft.     Tenderness: There is no abdominal tenderness. There is no right CVA tenderness or left CVA tenderness.  Musculoskeletal:        General: No swelling or tenderness. Normal range of motion.     Cervical back: Neck supple.  Skin:    General: Skin is warm and dry.  Neurological:     General: No focal deficit present.     Mental Status: She is alert and oriented to person, place, and time. Mental status is at baseline.     Cranial Nerves: No cranial nerve deficit.      ED Course/ Medical Decision Making/ A&P    Procedures .Critical Care  Performed by: Tretha Sciara, MD Authorized by: Tretha Sciara, MD   Critical care provider statement:    Critical care time (minutes):  60   Critical care was time spent personally by me on the following activities:  Development of treatment plan with patient or surrogate, discussions with consultants, evaluation of patient's response to treatment, examination of patient, ordering and  review of laboratory studies, ordering and review of radiographic studies, ordering and performing treatments and interventions, pulse oximetry, re-evaluation of patient's condition and review of old charts    Medications Ordered in ED Medications  albuterol (PROVENTIL) (2.5 MG/3ML) 0.083% nebulizer solution (20 mg/hr Nebulization New Bag/Given 12/23/22 1006)  albuterol (PROVENTIL) (2.5 MG/3ML) 0.083% nebulizer solution 2.5 mg (has no administration in time range)  potassium chloride SA (KLOR-CON M) CR tablet 40 mEq (has no administration in time range)  magnesium sulfate IVPB 2 g 50 mL (0 g Intravenous Stopped 12/23/22 1111)    Medical Decision  Making:    Whitney Beard is a 22 y.o. female who presented to the ED today with SOB detailed above.     Patient's presentation is complicated by their history of multiple comorbid medical problems.  Patient placed on continuous vitals and telemetry monitoring while in ED which was reviewed periodically.   Complete initial physical exam performed, notably the patient  was with fairly substantial tachypnea.  Only getting out to where it sentences.  Diffuse wheezing.      Reviewed and confirmed nursing documentation for past medical history, family history, social history.    Initial Assessment:   This is most consistent with an acute life/limb threatening illness complicated by underlying chronic conditions. Patient's history of present medicine physical exam findings are most consistent with acute severe asthma exacerbation.  Patient has been extensively treated by EMS but continues to be tachypneic and dyspneic requiring aggressive intervention as below.  Initial Plan:  RT called to bedside to initiate 1 hour continuous albuterol Magnesium IV infusion for bronchodilating initiated. Will consider epinephrine as well but will hold at this time pending improvement from other treatments Screening labs including CBC and Metabolic panel to evaluate for infectious or metabolic etiology of disease.  Urinalysis with reflex culture ordered to evaluate for UTI or relevant urologic/nephrologic pathology.  CXR to evaluate for structural/infectious intrathoracic pathology.  EKG to evaluate for cardiac pathology. Objective evaluation as below reviewed with plan for close reassessment  Initial Study Results:   Laboratory  All laboratory results reviewed without evidence of clinically relevant pathology.   Exceptions include:  EKG EKG was reviewed independently. Rate, rhythm, axis, intervals all examined and without medically relevant abnormality. ST segments without concerns for elevations.     Radiology  All images reviewed independently. Agree with radiology report at this time.   DG Chest Portable 1 View  Result Date: 12/23/2022 CLINICAL DATA:  Shortness of breath EXAM: PORTABLE CHEST 1 VIEW COMPARISON:  Chest radiograph 03/03/2022 FINDINGS: The cardiomediastinal contours are within normal limits. The lungs are clear. No pneumothorax or pleural effusion. No acute finding in the visualized skeleton. IMPRESSION: No acute finding in the chest.  The Electronically Signed   By: Emmaline Kluver M.D.   On: 12/23/2022 10:23     Consults:  Case discussed with hospitalist Dr. Allena Katz who evaluated patient at bedside..   Final Assessment and Plan:   Patient's history present on his physical exam findings are most consistent with asthma exacerbation.  Was able to get significant symptomatic resolution with continuous albuterol inhaler and discussed discharge versus observation with the patient.  She stated she would like to be evaluated by the hospital provider as well for second opinion before making her final decision regarding admission versus discharge. Dr. Allena Katz was consulted who evaluated her at bedside. Per their conversation, patient feels comfortable with outpatient care and management with nebulizer medications, strict return precautions.   Disposition:  I have considered need for hospitalization, however, considering all of the above, I believe this patient is stable for discharge at this time.  Patient/family educated about specific return precautions for given chief complaint and symptoms.  Patient/family educated about follow-up with PCP.     Patient/family expressed understanding of return precautions and need for follow-up. Patient spoken to regarding all imaging and laboratory results and appropriate follow up for these results. All education provided in verbal form with additional information in written form. Time was allowed for answering of patient questions. Patient  discharged.    Emergency Department Medication Summary:   Medications  albuterol (PROVENTIL) (2.5 MG/3ML) 0.083% nebulizer solution (20 mg/hr Nebulization New Bag/Given 12/23/22 1006)  albuterol (PROVENTIL) (2.5 MG/3ML) 0.083% nebulizer solution 2.5 mg (has no administration in time range)  potassium chloride SA (KLOR-CON M) CR tablet 40 mEq (has no administration in time range)  magnesium sulfate IVPB 2 g 50 mL (0 g Intravenous Stopped 12/23/22 1111)         Clinical Impression:  1. Severe persistent asthma with exacerbation   2. Moderate asthma with exacerbation, unspecified whether persistent      Discharge   Final Clinical Impression(s) / ED Diagnoses Final diagnoses:  Moderate asthma with exacerbation, unspecified whether persistent    Rx / DC Orders ED Discharge Orders          Ordered    For home use only DME Nebulizer machine        12/23/22 1524    Nebulizers (COMPRESSOR/NEBULIZER) MISC  As needed        12/23/22 1524    albuterol (PROVENTIL) (2.5 MG/3ML) 0.083% nebulizer solution  Every 4 hours PRN        12/23/22 1527    predniSONE (DELTASONE) 10 MG tablet        12/23/22 1527    benzonatate (TESSALON PERLES) 100 MG capsule  3 times daily        12/23/22 1527    dextromethorphan-guaiFENesin (MUCINEX DM) 30-600 MG 12hr tablet  2 times daily        12/23/22 1527    montelukast (SINGULAIR) 10 MG tablet  Daily at bedtime        12/23/22 1527    albuterol (VENTOLIN HFA) 108 (90 Base) MCG/ACT inhaler  Every 4 hours PRN        12/23/22 1527    acetaminophen (TYLENOL) 500 MG tablet  Every 6 hours PRN        12/23/22 1527              Tretha Sciara, MD 12/23/22 1553

## 2022-12-23 NOTE — Discharge Instructions (Signed)
You were seen today for an asthma attack.  Your labs do not reveal any acute pathology. We have discussed hospital observation versus admission.   You were evaluated by hospitalist as well. We have elected for outpatient observation with plan to follow-up with PCP within 48 hours for multiple medications been prescribed.  We have discussed strict return precautions should your symptoms worsen.

## 2022-12-23 NOTE — Consult Note (Signed)
[facility] Initial Consultation Note  Patient Name: Whitney Beard    TYO:[id number] PCP: Pcp, No     DOB: [birth year]  DOA: 12/23/2022 DOS: the patient was seen and examined on 12/23/2022  Primary Service: Tretha Sciara, MD Referring physician: EDP Reason for consult: asthma exacerbation  Assessment/Plan Acute asthma exacerbation  Pt presented with 1 day onset of shortness of breath and cough. No fever or chills. She continues to smoke. Covid and flu negative as well as RSV. Chest x ray negative as well.  Pt able to eat lunch.  Ambulated in the hallway and sats stayed above 94%. HR 120s. No dizziness.  At present pt feels stable for discharge from ER with close follow up with PCP.  Will send scripts for nebulizer medicine and machine as well as cough medicine and prednisone.   We will sign off at present, please call us again as needed.  HPI: Whitney Beard is a 22 y.o. female with Past medical history of asthma and obesity and ADHD. Patient is coming from Home Presented to the hospital with complaints of shortness of breath and cough ongoing for last 1 day.  No nausea no vomiting.  No chest pain.  No abdominal pain.  At the time of my evaluation. No fever no chills at home. She has used multiple rounds of albuterol therapy. In the ED she required multiple rounds of ibuprofen therapy as well. Was initially requiring no oxygen with the EMS.  Then was placed on 4 days of oxygen on arrival to ER.  At the time of my evaluation patient was able to ambulate without any drop in oxygen saturation.  Review of Systems: as mentioned in the history of present illness.  All other systems reviewed and are negative.  Past Medical History:  Diagnosis Date   ADHD (attention deficit hyperactivity disorder)    Allergic rhinitis    Asthma    Bipolar 1 disorder ([place])    Diabetes mellitus without complication ([place])    states today 11/11/20 "was told in the past that she was pre- diabetic"    Obesity    Sleep apnea    Suicide attempt [facility])    Suicide attempt by drug ingestion [facility])    Past Surgical History:  Procedure Laterality Date   ADENOIDECTOMY     TONSILLECTOMY     Social History:  reports that she has been smoking cigarettes. She has been smoking an average of .5 packs per day. She has never used smokeless tobacco. She reports current alcohol use. She reports current drug use. Drug: Marijuana. Allergies  Allergen Reactions   Other Shortness Of Breath    Animals such as cats and dogs and pollen - sneezing, asthma trigger    Family History  Problem Relation Age of Onset   Bipolar disorder Father    Schizophrenia Father    Liver disease Father    Asthma Father    Asthma Maternal Grandmother    Schizophrenia Maternal Grandmother    SIDS Maternal Aunt    Heart disease Paternal Grandmother    Diabetes Paternal Grandmother    Seizures Paternal Grandmother    Prior to Admission medications   Medication Sig Start Date End Date Taking? Authorizing Provider  albuterol (PROVENTIL) (2.5 MG/3ML) 0.083% nebulizer solution Take 3 mLs (2.5 mg total) by nebulization every 4 (four) hours as needed for wheezing or shortness of breath. 12/23/22 12/23/23 Yes Lavina Hamman, MD  benzonatate (TESSALON PERLES) 100 MG capsule Take 1 capsule (100 mg total)  by mouth 3 (three) times daily for 10 days. 12/23/22 01/02/23 Yes Lavina Hamman, MD  dextromethorphan-guaiFENesin [facility] DM) 30-600 MG 12hr tablet Take 1 tablet by mouth 2 (two) times daily for 7 days. 12/23/22 12/30/22 Yes Lavina Hamman, MD  Nebulizers (COMPRESSOR/NEBULIZER) MISC 1 each by Does not apply route as needed. 12/23/22  Yes Lavina Hamman, MD  predniSONE (DELTASONE) 10 MG tablet Take 40mg  daily for 3days,Take 30mg  daily for 3days,Take 20mg  daily for 3days,Take 10mg  daily for 3days, then stop 12/23/22  Yes Lavina Hamman, MD  acetaminophen (TYLENOL) 500 MG tablet Take 1 tablet (500 mg total) by mouth every 6 (six) hours as  needed for headache. 12/23/22   Lavina Hamman, MD  albuterol (VENTOLIN HFA) 108 (90 Base) MCG/ACT inhaler Inhale 2 puffs into the lungs every 4 (four) hours as needed for wheezing or shortness of breath. 12/23/22   Lavina Hamman, MD  montelukast (SINGULAIR) 10 MG tablet Take 1 tablet (10 mg total) by mouth at bedtime. 12/23/22   Lavina Hamman, MD    Physical Exam: Vitals:   12/23/22 1330 12/23/22 1356 12/23/22 1400 12/23/22 1542  BP: (!) 177/123  (!) 164/97 (!) 154/85  Pulse: 96  (!) 101 (!) 108  Resp: (!) 26  14 (!) 28  Temp:  98.1 F (36.7 C)    TempSrc:  Oral    SpO2: 100%  99% 99%  Weight:      Height:       General: Appear in mild distress; Cardiovascular: S1 and S2 Present, no Murmur, Respiratory: increased respiratory effort, Bilateral Air entry present, no Crackles, bilateral expiratory wheezes Abdomen: Bowel Sound present, Non tender Extremities: no Pedal edema Neurology: alert and oriented to time, place, and person  Data Reviewed: I have reviewed labs.  CBC and BMP and COVID swab.  Family Communication: Mother was on the phone while interview Primary team communication: Discussed with ED provider Thank you very much for involving [place] in care of your patient.  Author: Berle Mull, MD [facility] 12/23/2022 3:54 PM

## 2022-12-23 NOTE — ED Triage Notes (Signed)
Pt BIBA from home. Pt c/o SOB beginning yesterday afternoon and progressing thru night. Reports using rescue inhaler 50+ times. 96% on RA upon FD arrival.  Given 2 duo-nebs 125 solu-medrol IV  Aox4  HR: 110 BP: 116/85

## 2023-06-04 IMAGING — DX DG KNEE COMPLETE 4+V*L*
4 series · 4 of 4 positions shown · non-contrast
Comparison: None.

CLINICAL DATA: Fell, pain

EXAM:
LEFT KNEE - COMPLETE 4+ VIEW

[knee ap]
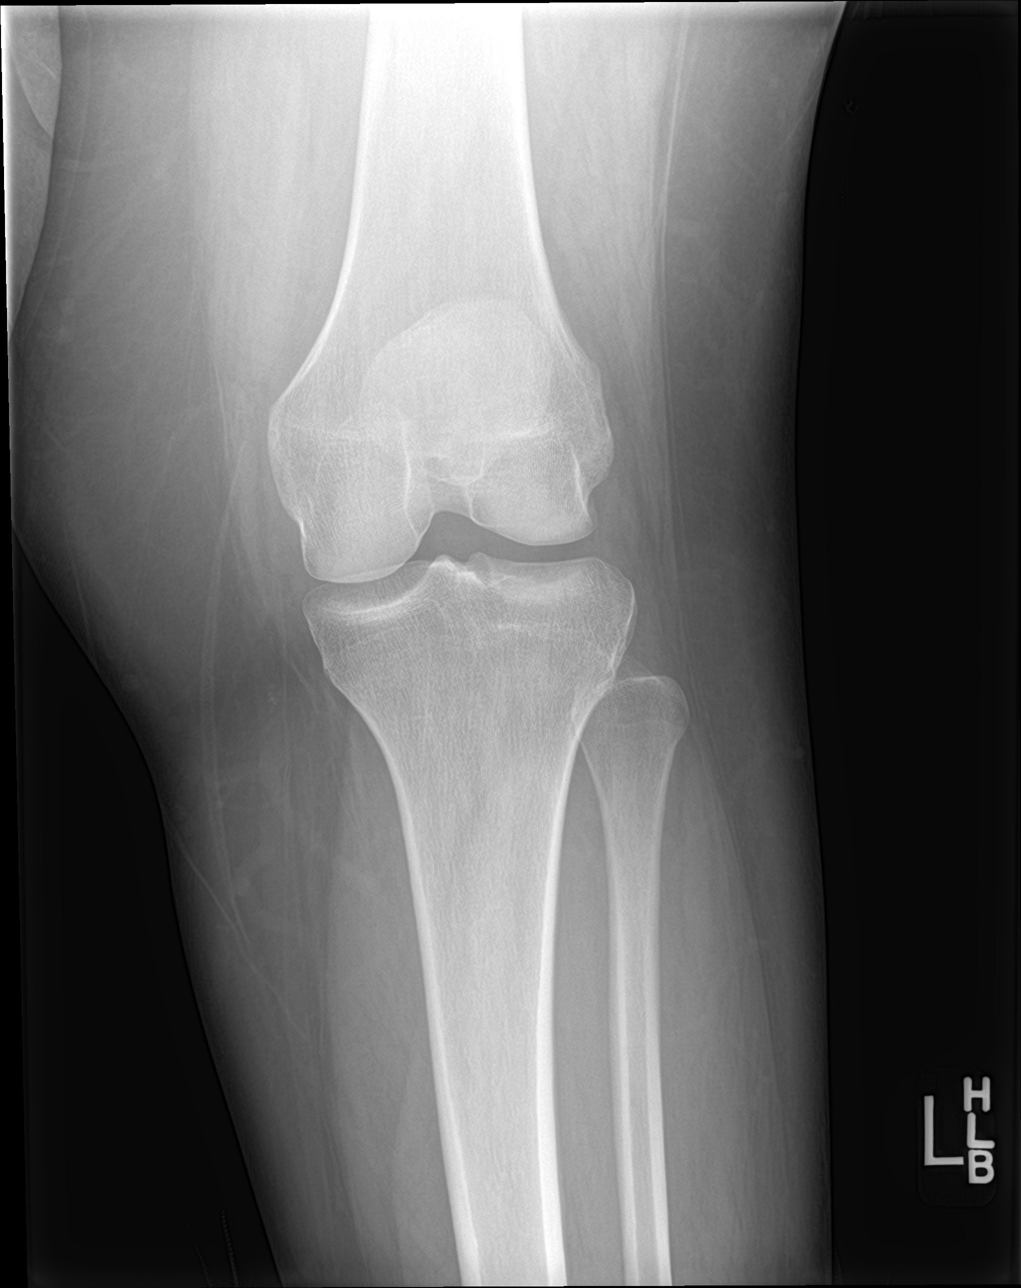

[knee lat]
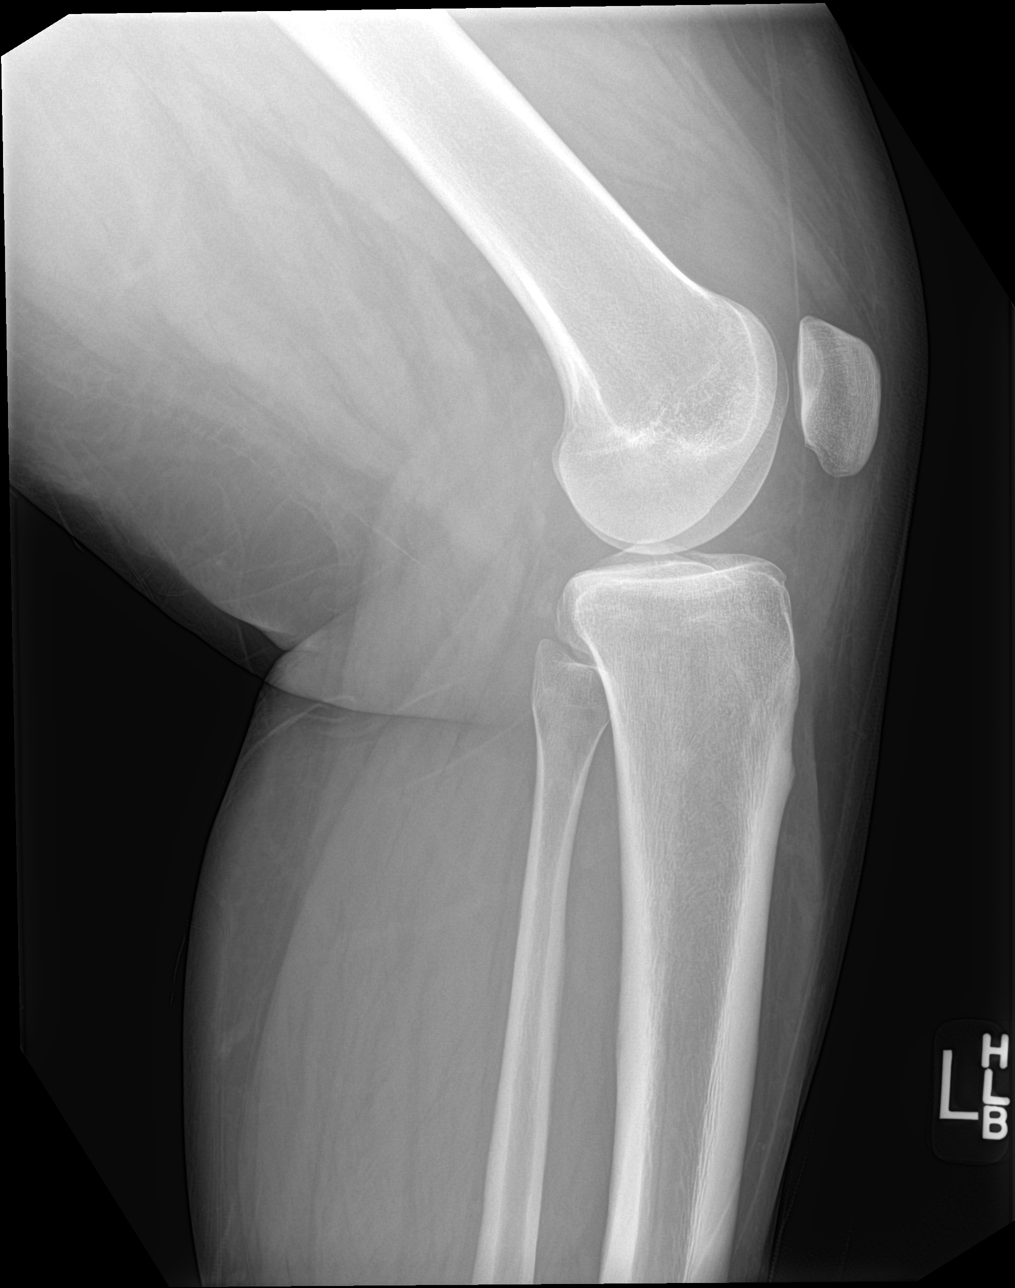

[knee obl (1 of 2)]
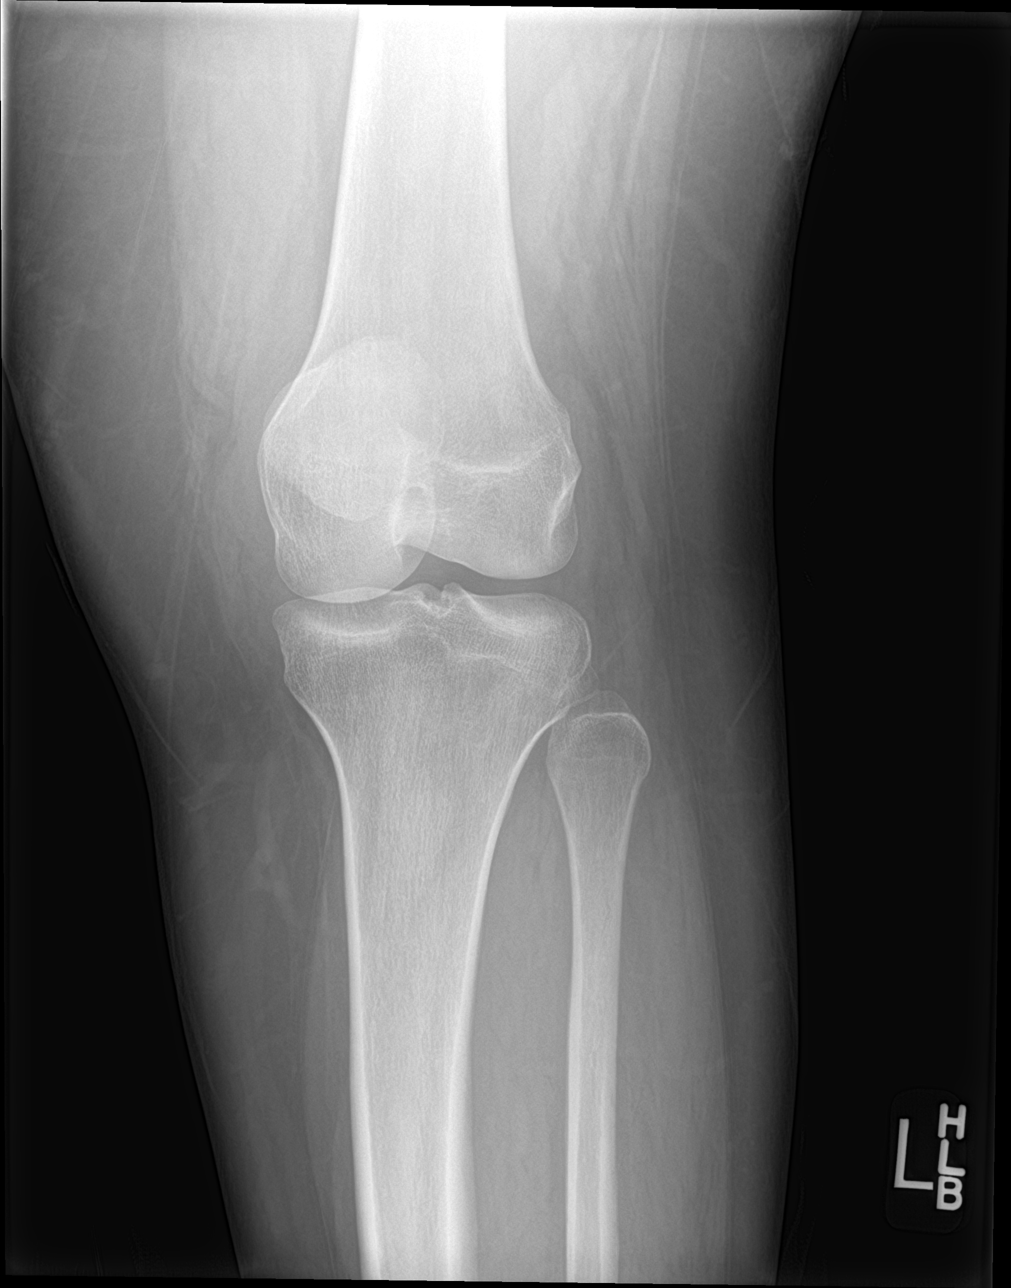

[knee obl (2 of 2)]
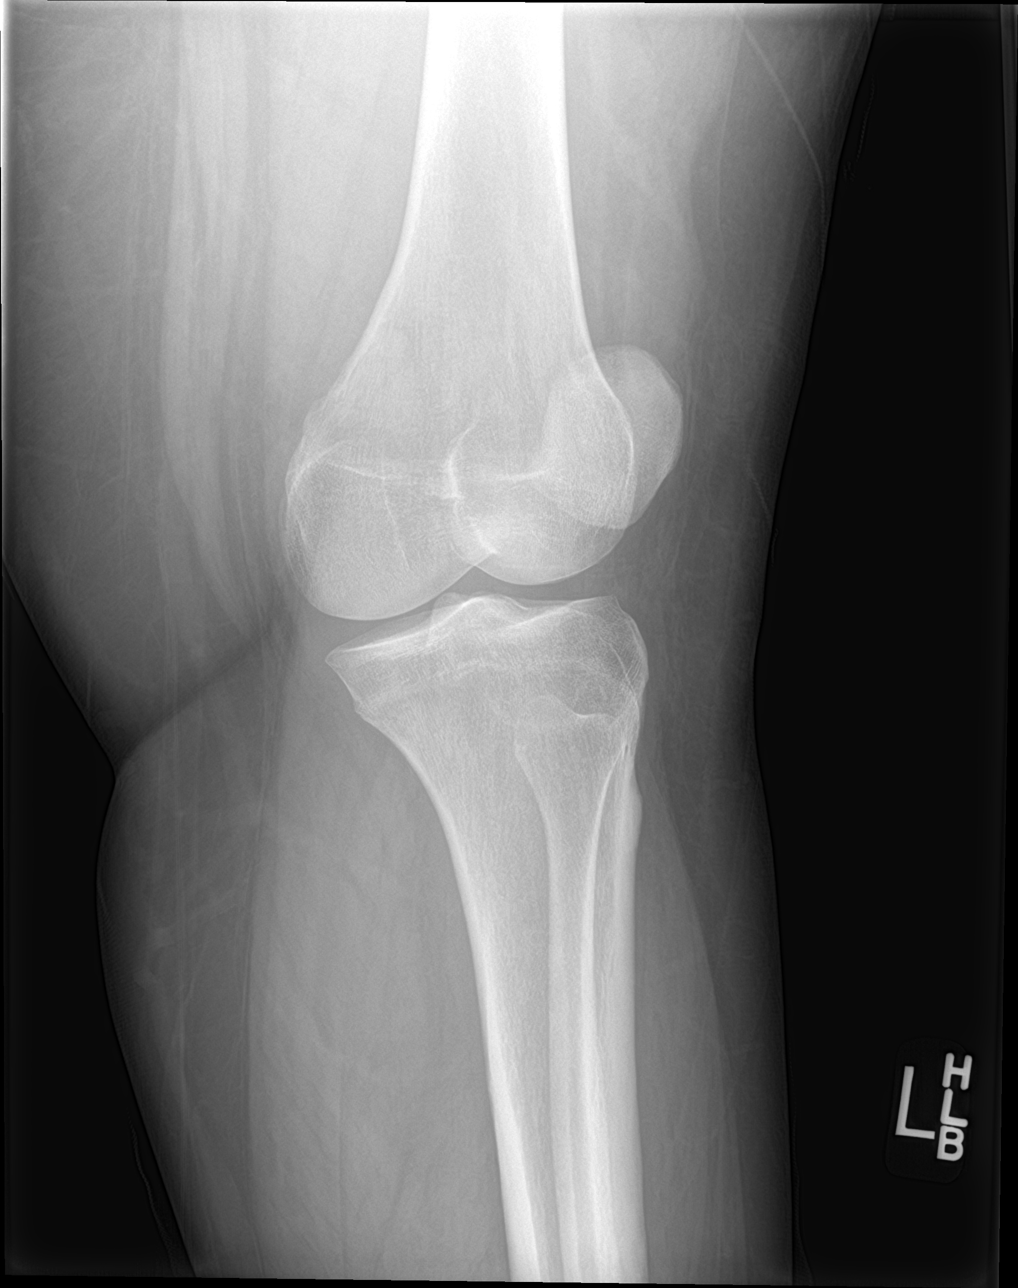

[4 of 4 positions shown; findings below may reference images not displayed]

FINDINGS: Frontal, bilateral oblique, lateral views of the left knee are
obtained. No fracture, subluxation, or dislocation. Joint spaces are
well preserved. Moderate joint effusion. Soft tissues are otherwise
unremarkable.
IMPRESSION: 1. Moderate joint effusion.  No acute fracture.

## 2023-07-04 ENCOUNTER — Emergency Department (HOSPITAL_BASED_OUTPATIENT_CLINIC_OR_DEPARTMENT_OTHER): Payer: MEDICAID | Admitting: Radiology

## 2023-07-04 ENCOUNTER — Other Ambulatory Visit: Payer: Self-pay

## 2023-07-04 ENCOUNTER — Encounter (HOSPITAL_BASED_OUTPATIENT_CLINIC_OR_DEPARTMENT_OTHER): Payer: Self-pay | Admitting: Emergency Medicine

## 2023-07-04 ENCOUNTER — Emergency Department (HOSPITAL_BASED_OUTPATIENT_CLINIC_OR_DEPARTMENT_OTHER)
Admission: EM | Admit: 2023-07-04 | Discharge: 2023-07-04 | Disposition: A | Discharge status: Home / Self Care | Payer: MEDICAID | Attending: Emergency Medicine | Admitting: Emergency Medicine

## 2023-07-04 DIAGNOSIS — X501XXA Overexertion from prolonged static or awkward postures, initial encounter: Secondary | ICD-10-CM | POA: Diagnosis not present

## 2023-07-04 DIAGNOSIS — E119 Type 2 diabetes mellitus without complications: Secondary | ICD-10-CM | POA: Insufficient documentation

## 2023-07-04 DIAGNOSIS — Z7952 Long term (current) use of systemic steroids: Secondary | ICD-10-CM | POA: Diagnosis not present

## 2023-07-04 DIAGNOSIS — M25562 Pain in left knee: Secondary | ICD-10-CM | POA: Diagnosis present

## 2023-07-04 DIAGNOSIS — M25572 Pain in left ankle and joints of left foot: Secondary | ICD-10-CM | POA: Diagnosis not present

## 2023-07-04 DIAGNOSIS — J45909 Unspecified asthma, uncomplicated: Secondary | ICD-10-CM | POA: Diagnosis not present

## 2023-07-04 MED ORDER — IBUPROFEN 600 MG PO TABS: 600.0000 mg | ORAL_TABLET | Freq: Four times a day (QID) | ORAL | 0 refills | Status: DC | PRN

## 2023-07-04 NOTE — ED Triage Notes (Signed)
Twisted left ankle and knee. Happened last night. Continued pain.

## 2023-07-04 NOTE — ED Provider Notes (Signed)
[place] EMERGENCY DEPARTMENT AT [facility] Provider Note   CSN: [id number] Arrival date & time: 07/04/23  1607     History  Chief Complaint  Patient presents with   Ankle Pain   Knee Injury    Whitney Beard is a 22 y.o. female.   Ankle Pain   22 year old female presents emergency department with complaints of left knee/ankle pain.  Patient states that she was intoxicated last night she tripped over an object landing on the front part of her left knee.  Denies trauma to head, loss consciousness or pain elsewhere.  States that she has been able to ambulate on affected leg but with pain.  Has tried no medication for her symptoms.  Denies feeling of these instability, clicking, catching, popping.  Reports pain mainly on the front part of her knee without radiation.  Also reports pain in the inner aspect of her left ankle.  States that she is feeling her ankle has been somewhat weak over the past month or 2 after she initially rolled it inward.  She reports subsequently having rolled her ankle 4-5 times since initial incident.  Requesting orthopedic follow-up.  Denies any chest pain, shortness of breath, abdominal pain, nausea, vomiting.  Past medical history significant for bipolar 1 disorder, diabetes mellitus type 2, SI, suicide attempt, borderline personality disorder, asthma, obesity  Home Medications Prior to Admission medications   Medication Sig Start Date End Date Taking? Authorizing Provider  ibuprofen (ADVIL) 600 MG tablet Take 1 tablet (600 mg total) by mouth every 6 (six) hours as needed. 07/04/23  Yes Sherian Maroon A, PA  acetaminophen (TYLENOL) 500 MG tablet Take 1 tablet (500 mg total) by mouth every 6 (six) hours as needed for headache. 12/23/22   Rolly Salter, MD  albuterol (PROVENTIL) (2.5 MG/3ML) 0.083% nebulizer solution Take 3 mLs (2.5 mg total) by nebulization every 4 (four) hours as needed for wheezing or shortness of breath. 12/23/22 12/23/23  Rolly Salter, MD  albuterol (VENTOLIN HFA) 108 (90 Base) MCG/ACT inhaler Inhale 2 puffs into the lungs every 4 (four) hours as needed for wheezing or shortness of breath. 12/23/22   Rolly Salter, MD  montelukast (SINGULAIR) 10 MG tablet Take 1 tablet (10 mg total) by mouth at bedtime. 12/23/22   Rolly Salter, MD  Nebulizers (COMPRESSOR/NEBULIZER) MISC 1 each by Does not apply route as needed. 12/23/22   Rolly Salter, MD  predniSONE (DELTASONE) 10 MG tablet Take 40mg  daily for 3days,Take 30mg  daily for 3days,Take 20mg  daily for 3days,Take 10mg  daily for 3days, then stop 12/23/22   Rolly Salter, MD      Allergies    Other    Review of Systems   Review of Systems  All other systems reviewed and are negative.   Physical Exam Updated Vital Signs BP 120/88   Pulse 81   Temp 98.9 F (37.2 C)   Resp 16   LMP 06/23/2023 (Approximate)   SpO2 97%  Physical Exam Vitals and nursing note reviewed.  Constitutional:      General: She is not in acute distress.    Appearance: She is well-developed.  HENT:     Head: Normocephalic and atraumatic.  Eyes:     Conjunctiva/sclera: Conjunctivae normal.  Cardiovascular:     Rate and Rhythm: Normal rate and regular rhythm.     Heart sounds: No murmur heard. Pulmonary:     Effort: Pulmonary effort is normal. No respiratory distress.  Breath sounds: Normal breath sounds.  Abdominal:     Palpations: Abdomen is soft.     Tenderness: There is no abdominal tenderness.  Musculoskeletal:        General: No swelling.     Cervical back: Neck supple.     Comments: No midline tenderness of cervical, thoracic, lumbar spine with no obvious step-off or deformity noted.  No chest wall tenderness to palpation.  Patient with full range of motion of bilateral shoulders, elbows, wrist, digits, hips, knees, ankles, digits.  No bony tenderness to palpation of upper extremities.  Tender palpation of anterior knee along patella as well as tibial tuberosity.  Tender  palpation of medial malleolus posteriorly of the left ankle but otherwise, without tenderness of bilateral lower extremities.  Pedal pulses 2+ bilaterally.  Symmetric strength 5 out of 5 bilateral upper and lower extremities.  Mild bruising noted over left anterior knee but otherwise, no overlying skin abnormalities appreciated.  Skin:    General: Skin is warm and dry.     Capillary Refill: Capillary refill takes less than 2 seconds.  Neurological:     Mental Status: She is alert.  Psychiatric:        Mood and Affect: Mood normal.     ED Results / Procedures / Treatments   Labs (all labs ordered are listed, but only abnormal results are displayed) Labs Reviewed - No data to display  EKG None  Radiology DG Knee Complete 4 Views Left  Result Date: 07/04/2023 CLINICAL DATA:  Injury.  Twisted left ankle and knee. EXAM: LEFT KNEE - COMPLETE 4+ VIEW COMPARISON:  None Available. FINDINGS: No significant joint effusion. No sign of acute fracture or dislocation. No significant arthropathy. IMPRESSION: Negative. Electronically Signed   By: Signa Kell M.D.   On: 07/04/2023 17:22   DG Ankle Complete Left  Result Date: 07/04/2023 CLINICAL DATA:  Twisting injury last night with pain. EXAM: LEFT ANKLE COMPLETE - 3+ VIEW COMPARISON:  None Available. FINDINGS: There is no evidence of fracture, dislocation, or joint effusion. There is no evidence of arthropathy or other focal bone abnormality. Soft tissues are unremarkable. IMPRESSION: Negative. Electronically Signed   By: Paulina Fusi M.D.   On: 07/04/2023 17:22    Procedures Procedures    Medications Ordered in ED Medications - No data to display  ED Course/ Medical Decision Making/ A&P                                 Medical Decision Making Amount and/or Complexity of Data Reviewed Radiology: ordered.  Risk Prescription drug management.   This patient presents to the ED for concern of left knee/ankle pain, this involves an extensive  number of treatment options, and is a complaint that carries with it a high risk of complications and morbidity.  The differential diagnosis includes fracture, strain/pain, dislocation, ligamentous/tendinous injury, neurovascular compromise, DVT, PAD, ischemic limb, cellulitis, erysipelas, necrotizing fasciitis, compartment syndrome   Co morbidities that complicate the patient evaluation  See HPI   Additional history obtained:  Additional history obtained from EMR External records from outside source obtained and reviewed including hospital records   Lab Tests:  N/a   Imaging Studies ordered:  I ordered imaging studies including left knee/ankle x-ray I independently visualized and interpreted imaging which showed no acute abnormality I agree with the radiologist interpretation   Cardiac Monitoring: / EKG:  The patient was maintained on a cardiac  monitor.  I personally viewed and interpreted the cardiac monitored which showed an underlying rhythm of: Rhythm   Consultations Obtained:  N/a   Problem List / ED Course / Critical interventions / Medication management  Left knee/ankle pain Reevaluation of the patient showed that the patient stayed the same I have reviewed the patients home medicines and have made adjustments as needed   Social Determinants of Health:  Denies tobacco, illicit drug use   Test / Admission - Considered:  Left knee/ankle pain Vitals signs  within normal range and stable throughout visit. Imaging studies significant for: See above 22 year old female presents emergency department with complaints of left knee and ankle pain after mechanical fall suffered yesterday.  No evidence of trauma elsewhere on HPI or physical exam.  X-ray imaging was obtained which was negative for any acute fracture or dislocation.  Patient without overlying skin abnormalities concerning for secondary infectious process.  No evidence of compartment syndrome clinically.   No evidence of ischemic limb or clinical indication for DVT.  Will recommend treatment of pain at home with NSAIDs, elevation, ice.  Given the patient has had recurring "ankle sprain" in the past of left ankle over the past 1 to 2 months, will place patient in ASO lace up brace and have her follow-up with orthopedics in the outpatient setting.  Treatment plan discussed at length with patient and she did not understanding was agreeable to said plan.  Patient overall well-appearing, afebrile in no acute distress. Worrisome signs and symptoms were discussed with the patient, and the patient acknowledged understanding to return to the ED if noticed. Patient was stable upon discharge.          Final Clinical Impression(s) / ED Diagnoses Final diagnoses:  Acute pain of left knee  Acute left ankle pain    Rx / DC Orders ED Discharge Orders          Ordered    ibuprofen (ADVIL) 600 MG tablet  Every 6 hours PRN        07/04/23 1735              Peter Garter, PA 07/04/23 1745    Benjiman Core, MD 07/04/23 [phone]

## 2023-07-04 NOTE — Discharge Instructions (Addendum)
As discussed, x-rays are negative for any fracture or dislocation.  Will place you in an ankle brace to help aid in supporting your ankle and recommend follow-up with orthopedics in the outpatient setting.  Attached is information to call to schedule appointment.  Recommend rest, ice, elevation of your leg as well as taking medication in the form of ibuprofen/Aleve.  Please do not hesitate to return to emergency department if the worrisome signs and symptoms we discussed become apparent.

## 2023-08-24 ENCOUNTER — Other Ambulatory Visit: Payer: Self-pay

## 2023-08-24 ENCOUNTER — Inpatient Hospital Stay (HOSPITAL_COMMUNITY): Payer: MEDICAID

## 2023-08-24 ENCOUNTER — Inpatient Hospital Stay (HOSPITAL_COMMUNITY)
Admission: EM | Admit: 2023-08-24 | Discharge: 2023-08-28 | DRG: 202 | Disposition: A | Discharge status: Home / Self Care | Payer: MEDICAID | Attending: Internal Medicine | Admitting: Internal Medicine

## 2023-08-24 ENCOUNTER — Encounter (HOSPITAL_COMMUNITY): Payer: Self-pay

## 2023-08-24 ENCOUNTER — Emergency Department (HOSPITAL_COMMUNITY): Payer: MEDICAID

## 2023-08-24 DIAGNOSIS — E119 Type 2 diabetes mellitus without complications: Secondary | ICD-10-CM | POA: Diagnosis present

## 2023-08-24 DIAGNOSIS — Z6841 Body Mass Index (BMI) 40.0 and over, adult: Secondary | ICD-10-CM | POA: Diagnosis not present

## 2023-08-24 DIAGNOSIS — R000 Tachycardia, unspecified: Secondary | ICD-10-CM | POA: Diagnosis present

## 2023-08-24 DIAGNOSIS — F329 Major depressive disorder, single episode, unspecified: Secondary | ICD-10-CM | POA: Diagnosis present

## 2023-08-24 DIAGNOSIS — Z791 Long term (current) use of non-steroidal anti-inflammatories (NSAID): Secondary | ICD-10-CM | POA: Diagnosis not present

## 2023-08-24 DIAGNOSIS — Z833 Family history of diabetes mellitus: Secondary | ICD-10-CM | POA: Diagnosis not present

## 2023-08-24 DIAGNOSIS — Z79899 Other long term (current) drug therapy: Secondary | ICD-10-CM | POA: Diagnosis not present

## 2023-08-24 DIAGNOSIS — Z818 Family history of other mental and behavioral disorders: Secondary | ICD-10-CM

## 2023-08-24 DIAGNOSIS — Z825 Family history of asthma and other chronic lower respiratory diseases: Secondary | ICD-10-CM | POA: Diagnosis not present

## 2023-08-24 DIAGNOSIS — F322 Major depressive disorder, single episode, severe without psychotic features: Secondary | ICD-10-CM | POA: Diagnosis not present

## 2023-08-24 DIAGNOSIS — F909 Attention-deficit hyperactivity disorder, unspecified type: Secondary | ICD-10-CM | POA: Diagnosis present

## 2023-08-24 DIAGNOSIS — Z9109 Other allergy status, other than to drugs and biological substances: Secondary | ICD-10-CM | POA: Diagnosis not present

## 2023-08-24 DIAGNOSIS — F17210 Nicotine dependence, cigarettes, uncomplicated: Secondary | ICD-10-CM | POA: Diagnosis present

## 2023-08-24 DIAGNOSIS — J45901 Unspecified asthma with (acute) exacerbation: Secondary | ICD-10-CM | POA: Diagnosis present

## 2023-08-24 DIAGNOSIS — E66813 Obesity, class 3: Secondary | ICD-10-CM

## 2023-08-24 DIAGNOSIS — G4733 Obstructive sleep apnea (adult) (pediatric): Secondary | ICD-10-CM | POA: Diagnosis present

## 2023-08-24 DIAGNOSIS — J4551 Severe persistent asthma with (acute) exacerbation: Secondary | ICD-10-CM | POA: Diagnosis present

## 2023-08-24 DIAGNOSIS — Z5320 Procedure and treatment not carried out because of patient's decision for unspecified reasons: Secondary | ICD-10-CM | POA: Diagnosis present

## 2023-08-24 DIAGNOSIS — F319 Bipolar disorder, unspecified: Secondary | ICD-10-CM | POA: Diagnosis present

## 2023-08-24 DIAGNOSIS — Z77120 Contact with and (suspected) exposure to mold (toxic): Secondary | ICD-10-CM | POA: Diagnosis not present

## 2023-08-24 DIAGNOSIS — Z1152 Encounter for screening for COVID-19: Secondary | ICD-10-CM

## 2023-08-24 DIAGNOSIS — Z8249 Family history of ischemic heart disease and other diseases of the circulatory system: Secondary | ICD-10-CM | POA: Diagnosis not present

## 2023-08-24 DIAGNOSIS — Z9151 Personal history of suicidal behavior: Secondary | ICD-10-CM | POA: Diagnosis not present

## 2023-08-24 DIAGNOSIS — F419 Anxiety disorder, unspecified: Secondary | ICD-10-CM | POA: Diagnosis present

## 2023-08-24 LAB — BASIC METABOLIC PANEL
Anion gap: 9 (ref 5–15)
BUN: 10 mg/dL (ref 6–20)
CO2: 22 mmol/L (ref 22–32)
Calcium: 8.6 mg/dL — ABNORMAL LOW (ref 8.9–10.3)
Chloride: 106 mmol/L (ref 98–111)
Creatinine, Ser: 0.61 mg/dL (ref 0.44–1.00)
GFR, Estimated: >60 mL/min (ref >60)
Glucose, Bld: 105 mg/dL — ABNORMAL HIGH (ref 70–99)
Potassium: 3.6 mmol/L (ref 3.5–5.1)
Sodium: 137 mmol/L (ref 135–145)

## 2023-08-24 LAB — CBC
HCT: 38.1 % (ref 36.0–46.0)
Hemoglobin: 12.8 g/dL (ref 12.0–15.0)
MCH: 32.2 pg (ref 26.0–34.0)
MCHC: 33.6 g/dL (ref 30.0–36.0)
MCV: 96 fL (ref 80.0–100.0)
Platelets: 272 10*3/uL (ref 150–400)
RBC: 3.97 MIL/uL (ref 3.87–5.11)
RDW: 12.5 % (ref 11.5–15.5)
WBC: 6.1 10*3/uL (ref 4.0–10.5)
nRBC: 0 % (ref 0.0–0.2)

## 2023-08-24 LAB — RESP PANEL BY RT-PCR (RSV, FLU A&B, COVID)  RVPGX2
Influenza A by PCR: NEGATIVE
Influenza B by PCR: NEGATIVE
Resp Syncytial Virus by PCR: NEGATIVE
SARS Coronavirus 2 by RT PCR: NEGATIVE

## 2023-08-24 MED ORDER — IOHEXOL 350 MG/ML SOLN
75.0000 mL | Freq: Once | INTRAVENOUS | Status: AC | PRN
  Administered 2023-08-24: 100 mL via INTRAVENOUS

## 2023-08-24 MED ORDER — ONDANSETRON HCL 4 MG/2ML IJ SOLN: 4.0000 mg | Freq: Four times a day (QID) | INTRAMUSCULAR | Status: DC | PRN

## 2023-08-24 MED ORDER — MAGNESIUM SULFATE 2 GM/50ML IV SOLN
2.0000 g | Freq: Once | INTRAVENOUS | Status: AC
  Administered 2023-08-24: 2 g via INTRAVENOUS
  Filled 2023-08-24: qty 50

## 2023-08-24 MED ORDER — MONTELUKAST SODIUM 10 MG PO TABS
10.0000 mg | ORAL_TABLET | Freq: Every day | ORAL | Status: DC
  Administered 2023-08-25 – 2023-08-27 (×4): 10 mg via ORAL
  Filled 2023-08-24 (×4): qty 1

## 2023-08-24 MED ORDER — LORAZEPAM 1 MG PO TABS
1.0000 mg | ORAL_TABLET | ORAL | Status: DC | PRN
  Administered 2023-08-24: 1 mg via ORAL
  Filled 2023-08-24: qty 1

## 2023-08-24 MED ORDER — ONDANSETRON HCL 4 MG PO TABS: 4.0000 mg | ORAL_TABLET | Freq: Four times a day (QID) | ORAL | Status: DC | PRN

## 2023-08-24 MED ORDER — METHYLPREDNISOLONE SODIUM SUCC 125 MG IJ SOLR
125.0000 mg | Freq: Once | INTRAMUSCULAR | Status: AC
  Administered 2023-08-24: 125 mg via INTRAVENOUS
  Filled 2023-08-24: qty 2

## 2023-08-24 MED ORDER — ALBUTEROL SULFATE (2.5 MG/3ML) 0.083% IN NEBU
10.0000 mg/h | INHALATION_SOLUTION | Freq: Once | RESPIRATORY_TRACT | Status: AC
  Administered 2023-08-24: 10 mg/h via RESPIRATORY_TRACT
  Filled 2023-08-24: qty 12

## 2023-08-24 MED ORDER — ALBUTEROL SULFATE (2.5 MG/3ML) 0.083% IN NEBU
2.5000 mg | INHALATION_SOLUTION | RESPIRATORY_TRACT | Status: DC | PRN
  Administered 2023-08-24 – 2023-08-25 (×3): 2.5 mg via RESPIRATORY_TRACT
  Filled 2023-08-24 (×3): qty 3

## 2023-08-24 MED ORDER — ENOXAPARIN SODIUM 60 MG/0.6ML IJ SOSY
60.0000 mg | PREFILLED_SYRINGE | Freq: Every day | INTRAMUSCULAR | Status: DC
  Administered 2023-08-24 – 2023-08-27 (×4): 60 mg via SUBCUTANEOUS
  Filled 2023-08-24 (×5): qty 0.6

## 2023-08-24 MED ORDER — IPRATROPIUM-ALBUTEROL 0.5-2.5 (3) MG/3ML IN SOLN
3.0000 mL | Freq: Once | RESPIRATORY_TRACT | Status: AC
  Administered 2023-08-24: 3 mL via RESPIRATORY_TRACT
  Filled 2023-08-24: qty 3

## 2023-08-24 MED ORDER — LORAZEPAM 2 MG/ML IJ SOLN
1.0000 mg | INTRAMUSCULAR | Status: DC | PRN
  Administered 2023-08-24 – 2023-08-25 (×2): 1 mg via INTRAVENOUS
  Filled 2023-08-24 (×4): qty 1

## 2023-08-24 MED ORDER — METHYLPREDNISOLONE SODIUM SUCC 40 MG IJ SOLR
40.0000 mg | Freq: Two times a day (BID) | INTRAMUSCULAR | Status: DC
  Administered 2023-08-25: 40 mg via INTRAVENOUS
  Filled 2023-08-24: qty 1

## 2023-08-24 MED ORDER — ACETAMINOPHEN 650 MG RE SUPP: 650.0000 mg | Freq: Four times a day (QID) | RECTAL | Status: DC | PRN

## 2023-08-24 MED ORDER — TRAZODONE HCL 50 MG PO TABS
25.0000 mg | ORAL_TABLET | Freq: Every evening | ORAL | Status: DC | PRN
  Administered 2023-08-24: 25 mg via ORAL
  Filled 2023-08-24: qty 1

## 2023-08-24 MED ORDER — ALBUTEROL SULFATE (2.5 MG/3ML) 0.083% IN NEBU
10.0000 mg/h | INHALATION_SOLUTION | Freq: Once | RESPIRATORY_TRACT | Status: AC
  Administered 2023-08-24: 10 mg/h via RESPIRATORY_TRACT
  Filled 2023-08-24: qty 3
  Filled 2023-08-24: qty 12

## 2023-08-24 MED ORDER — MOMETASONE FURO-FORMOTEROL FUM 200-5 MCG/ACT IN AERO
2.0000 | INHALATION_SPRAY | Freq: Two times a day (BID) | RESPIRATORY_TRACT | Status: DC
  Filled 2023-08-24: qty 8.8

## 2023-08-24 MED ORDER — ACETAMINOPHEN 325 MG PO TABS
650.0000 mg | ORAL_TABLET | Freq: Four times a day (QID) | ORAL | Status: DC | PRN
  Administered 2023-08-24 – 2023-08-25 (×2): 650 mg via ORAL
  Filled 2023-08-24 (×2): qty 2

## 2023-08-24 NOTE — ED Notes (Signed)
ED TO INPATIENT HANDOFF REPORT  ED Nurse Name and Phone #: [phone]  S Name/Age/Gender Whitney Beard 22 y.o. female Room/Bed: WA18/WA18  Code Status   Code Status: Full Code  Home/SNF/Other Home Patient oriented to: self, place, time, and situation Is this baseline? Yes   Triage Complete: Triage complete  Chief Complaint Asthma exacerbation [J45.901]  Triage Note Patient has been short of breath since last night. Has asthma. Took 2 puffs of albuterol today. No relief. Unable to speak in full sentences. Labored breathing at rest. Coughing up yellow mucus.    Allergies Allergies  Allergen Reactions   Other Shortness Of Breath    Animals such as cats and dogs and pollen - sneezing, asthma trigger     Level of Care/Admitting Diagnosis ED Disposition     ED Disposition  Admit   Condition  --   Comment  Hospital Area: [facility] [[id number]]  Level of Care: Progressive [102]  Admit to Progressive based on following criteria: RESPIRATORY PROBLEMS hypoxemic/hypercapnic respiratory failure that is responsive to NIPPV (BiPAP) or High Flow Nasal Cannula (6-80 lpm). Frequent assessment/intervention, no > Q2 hrs < Q4 hrs, to maintain oxygenation and pulmonary hygiene.  May admit patient to [facility] or [facility] if equivalent level of care is available:: Yes  Covid Evaluation: Confirmed COVID Negative  Diagnosis: Asthma exacerbation [697512]  Admitting Physician: Maryln Gottron [[id number]]  Attending Physician: Kirby Crigler, MIR [place] [[id number]]  Certification:: I certify this patient will need inpatient services for at least 2 midnights          B Medical/Surgery History Past Medical History:  Diagnosis Date   ADHD (attention deficit hyperactivity disorder)    Allergic rhinitis    Asthma    Bipolar 1 disorder (HCC)    Diabetes mellitus without complication (HCC)    states today 11/11/20 "was told in the past that she was pre- diabetic"   Obesity    Sleep  apnea    Suicide attempt [facility])    Suicide attempt by drug ingestion [facility])    Past Surgical History:  Procedure Laterality Date   ADENOIDECTOMY     TONSILLECTOMY       A IV Location/Drains/Wounds Patient Lines/Drains/Airways Status     Active Line/Drains/Airways     Name Placement date Placement time Site Days   Peripheral IV 08/24/23 20 G Left Antecubital 08/24/23  1415  Antecubital  less than 1            Intake/Output Last 24 hours No intake or output data in the 24 hours ending 08/24/23 2009  Labs/Imaging Results for orders placed or performed during the hospital encounter of 08/24/23 (from the past 48 hour(s))  Resp panel by RT-PCR (RSV, Flu A&B, Covid) Anterior Nasal Swab     Status: None   Collection Time: 08/24/23  2:14 PM   Specimen: Anterior Nasal Swab  Result Value Ref Range   SARS Coronavirus 2 by RT PCR NEGATIVE NEGATIVE    Comment: (NOTE) SARS-CoV-2 target nucleic acids are NOT DETECTED.  The SARS-CoV-2 RNA is generally detectable in upper respiratory specimens during the acute phase of infection. The lowest concentration of SARS-CoV-2 viral copies this assay can detect is 138 copies/mL. A negative result does not preclude SARS-Cov-2 infection and should not be used as the sole basis for treatment or other patient management decisions. A negative result may occur with  improper specimen collection/handling, submission of specimen other than nasopharyngeal swab, presence of viral mutation(s) within the  areas targeted by this assay, and inadequate number of viral copies(<138 copies/mL). A negative result must be combined with clinical observations, patient history, and epidemiological information. The expected result is Negative.  Fact Sheet for Patients:  BloggerCourse.com  Fact Sheet for Healthcare Providers:  SeriousBroker.it  This test is no t yet approved or cleared by the [place] FDA and   has been authorized for detection and/or diagnosis of SARS-CoV-2 by FDA under an Emergency Use Authorization (EUA). This EUA will remain  in effect (meaning this test can be used) for the duration of the COVID-19 declaration under Section 564(b)(1) of the Act, 21 U.S.C.section 360bbb-3(b)(1), unless the authorization is terminated  or revoked sooner.       Influenza A by PCR NEGATIVE NEGATIVE   Influenza B by PCR NEGATIVE NEGATIVE    Comment: (NOTE) The Xpert Xpress SARS-CoV-2/FLU/RSV plus assay is intended as an aid in the diagnosis of influenza from Nasopharyngeal swab specimens and should not be used as a sole basis for treatment. Nasal washings and aspirates are unacceptable for Xpert Xpress SARS-CoV-2/FLU/RSV testing.  Fact Sheet for Patients: BloggerCourse.com  Fact Sheet for Healthcare Providers: SeriousBroker.it  This test is not yet approved or cleared by the [place] FDA and has been authorized for detection and/or diagnosis of SARS-CoV-2 by FDA under an Emergency Use Authorization (EUA). This EUA will remain in effect (meaning this test can be used) for the duration of the COVID-19 declaration under Section 564(b)(1) of the Act, 21 U.S.C. section 360bbb-3(b)(1), unless the authorization is terminated or revoked.     Resp Syncytial Virus by PCR NEGATIVE NEGATIVE    Comment: (NOTE) Fact Sheet for Patients: BloggerCourse.com  Fact Sheet for Healthcare Providers: SeriousBroker.it  This test is not yet approved or cleared by the [place] FDA and has been authorized for detection and/or diagnosis of SARS-CoV-2 by FDA under an Emergency Use Authorization (EUA). This EUA will remain in effect (meaning this test can be used) for the duration of the COVID-19 declaration under Section 564(b)(1) of the Act, 21 U.S.C. section 360bbb-3(b)(1), unless the  authorization is terminated or revoked.  Performed at [facility], [address] [place], [place] [address]   CBC     Status: None   Collection Time: 08/24/23  2:30 PM  Result Value Ref Range   WBC 6.1 4.0 - 10.5 K/uL   RBC 3.97 3.87 - 5.11 MIL/uL   Hemoglobin 12.8 12.0 - 15.0 g/dL   HCT 74.2 59.5 - 63.8 %   MCV 96.0 80.0 - 100.0 fL   MCH 32.2 26.0 - 34.0 pg   MCHC 33.6 30.0 - 36.0 g/dL   RDW 75.6 43.3 - 29.5 %   Platelets 272 150 - 400 K/uL   nRBC 0.0 0.0 - 0.2 %    Comment: Performed at [facility], [address] [place], [place] [address]  Basic metabolic panel     Status: Abnormal   Collection Time: 08/24/23  2:30 PM  Result Value Ref Range   Sodium 137 135 - 145 mmol/L   Potassium 3.6 3.5 - 5.1 mmol/L   Chloride 106 98 - 111 mmol/L   CO2 22 22 - 32 mmol/L   Glucose, Bld 105 (H) 70 - 99 mg/dL    Comment: Glucose reference range applies only to samples taken after fasting for at least 8 hours.   BUN 10 6 - 20 mg/dL   Creatinine, Ser 6.60 0.44 - 1.00 mg/dL  Calcium 8.6 (L) 8.9 - 10.3 mg/dL   GFR, Estimated >47 >82 mL/min    Comment: (NOTE) Calculated using the CKD-EPI Creatinine Equation (2021)    Anion gap 9 5 - 15    Comment: Performed at [facility], [address], [place], [place] [address]   DG Chest 2 View  Result Date: 08/24/2023 CLINICAL DATA:  Shortness of breath EXAM: CHEST - 2 VIEW COMPARISON:  Chest x-ray 12/23/2022 FINDINGS: The heart size and mediastinal contours are within normal limits. Both lungs are clear. The visualized skeletal structures are unremarkable. IMPRESSION: No active cardiopulmonary disease. Electronically Signed   By: Darliss Cheney M.D.   On: 08/24/2023 16:04    Pending Labs Unresulted Labs (From admission, onward)     Start     Ordered   08/25/23 0500  Basic metabolic panel  Tomorrow morning,   R        08/24/23 1925   08/25/23 0500  CBC  Tomorrow morning,   R         08/24/23 1925   08/24/23 1925  HIV Antibody (routine testing w rflx)  (HIV Antibody (Routine testing w reflex) panel)  Once,   R        08/24/23 1925            Vitals/Pain Today's Vitals   08/24/23 1922 08/24/23 1935 08/24/23 1943 08/24/23 1945  BP:    (!) 180/112  Pulse: (!) 133 (!) 126 (!) 123 (!) 125  Resp: 20 (!) 21 20 (!) 22  Temp:      TempSrc:      SpO2: 94% 97% 95% 92%  Weight:      Height:      PainSc:        Isolation Precautions No active isolations  Medications Medications  montelukast (SINGULAIR) tablet 10 mg (has no administration in time range)  mometasone-formoterol (DULERA) 200-5 MCG/ACT inhaler 2 puff (has no administration in time range)  enoxaparin (LOVENOX) injection 60 mg (has no administration in time range)  acetaminophen (TYLENOL) tablet 650 mg (has no administration in time range)    Or  acetaminophen (TYLENOL) suppository 650 mg (has no administration in time range)  traZODone (DESYREL) tablet 25 mg (has no administration in time range)  ondansetron (ZOFRAN) tablet 4 mg (has no administration in time range)    Or  ondansetron (ZOFRAN) injection 4 mg (has no administration in time range)  albuterol (PROVENTIL) (2.5 MG/3ML) 0.083% nebulizer solution 2.5 mg (2.5 mg Nebulization Given 08/24/23 2008)  methylPREDNISolone sodium succinate (SOLU-MEDROL) 40 mg/mL injection 40 mg (has no administration in time range)  LORazepam (ATIVAN) tablet 1 mg (1 mg Oral Given 08/24/23 1940)  methylPREDNISolone sodium succinate (SOLU-MEDROL) 125 mg/2 mL injection 125 mg (125 mg Intravenous Given 08/24/23 1428)  ipratropium-albuterol (DUONEB) 0.5-2.5 (3) MG/3ML nebulizer solution 3 mL (3 mLs Nebulization Given 08/24/23 1447)  magnesium sulfate IVPB 2 g 50 mL (0 g Intravenous Stopped 08/24/23 1529)  albuterol (PROVENTIL) (2.5 MG/3ML) 0.083% nebulizer solution (10 mg/hr Nebulization Given 08/24/23 1601)  albuterol (PROVENTIL) (2.5 MG/3ML) 0.083% nebulizer solution (10 mg/hr  Nebulization Given 08/24/23 1742)    Mobility walks     Focused Assessments Pulmonary Assessment Handoff:  Lung sounds: Bilateral Breath Sounds: Expiratory wheezes, Diminished O2 Device: Room Air      R Recommendations: See Admitting Provider Note  Report given to:   Additional Notes: asthma exacerbation / a&o x 4 / on RA / duoneb prn q2 last given 2009 /  1mg  PO ativan given for anxiety and to help with WOB / bedpan until HR & breathing better / 20 LAC

## 2023-08-24 NOTE — Progress Notes (Signed)
CAT removed at 1705- PT states breathing "a little better."

## 2023-08-24 NOTE — ED Notes (Signed)
Late entry: pt c/o increased chest tightness, RR increased with increased wheezing. PRN duoneb given see MAR, admitting MD made aware.

## 2023-08-24 NOTE — ED Provider Notes (Signed)
[place] EMERGENCY DEPARTMENT AT [facility] Provider Note   CSN: [id number] Arrival date & time: 08/24/23  1311     History  Chief Complaint  Patient presents with   Shortness of Breath    Whitney Beard is a 22 y.o. female past medical history significant for diabetes, obesity, anxiety, asthma who presents with concern for asthma exacerbation since last night.  Patient reports she recently moved and has not had access to her nebulizer or montelukast, she took 2 puffs of her rescue inhaler but has not had any relief.  Cannot speak in full sentences, labored breathing at rest.  She reports symptoms worsened by change of weather, allergies.  She denies any chest pain, she denies any fever, chills.  She reports intermittent cough.   Shortness of Breath      Home Medications Prior to Admission medications   Medication Sig Start Date End Date Taking? Authorizing Provider  acetaminophen (TYLENOL) 500 MG tablet Take 1 tablet (500 mg total) by mouth every 6 (six) hours as needed for headache. 12/23/22   Rolly Salter, MD  albuterol (PROVENTIL) (2.5 MG/3ML) 0.083% nebulizer solution Take 3 mLs (2.5 mg total) by nebulization every 4 (four) hours as needed for wheezing or shortness of breath. 12/23/22 12/23/23  Rolly Salter, MD  albuterol (VENTOLIN HFA) 108 (90 Base) MCG/ACT inhaler Inhale 2 puffs into the lungs every 4 (four) hours as needed for wheezing or shortness of breath. 12/23/22   Rolly Salter, MD  ibuprofen (ADVIL) 600 MG tablet Take 1 tablet (600 mg total) by mouth every 6 (six) hours as needed. 07/04/23   Sherian Maroon A, PA  montelukast (SINGULAIR) 10 MG tablet Take 1 tablet (10 mg total) by mouth at bedtime. 12/23/22   Rolly Salter, MD  Nebulizers (COMPRESSOR/NEBULIZER) MISC 1 each by Does not apply route as needed. 12/23/22   Rolly Salter, MD  predniSONE (DELTASONE) 10 MG tablet Take 40mg  daily for 3days,Take 30mg  daily for 3days,Take 20mg  daily for 3days,Take  10mg  daily for 3days, then stop 12/23/22   Rolly Salter, MD      Allergies    Other    Review of Systems   Review of Systems  Respiratory:  Positive for shortness of breath.   All other systems reviewed and are negative.   Physical Exam Updated Vital Signs BP (!) 147/120 Comment: nurse notified  Pulse (!) 133   Temp 98.7 F (37.1 C) (Oral)   Resp 20   Ht 5\' 6"  (1.676 m)   Wt 122 kg   SpO2 94%   BMI 43.41 kg/m  Physical Exam Vitals and nursing note reviewed.  Constitutional:      General: She is not in acute distress.    Appearance: Normal appearance.  HENT:     Head: Normocephalic and atraumatic.  Eyes:     General:        Right eye: No discharge.        Left eye: No discharge.  Cardiovascular:     Rate and Rhythm: Normal rate and regular rhythm.     Heart sounds: No murmur heard.    No friction rub. No gallop.  Pulmonary:     Effort: Pulmonary effort is normal.     Breath sounds: Normal breath sounds.     Comments: Diffuse biphasic wheezing, shallow respiratory effort.  No significant rhonchi or focal consolidation noted. Abdominal:     General: Bowel sounds are normal.  Palpations: Abdomen is soft.  Skin:    General: Skin is warm and dry.     Capillary Refill: Capillary refill takes less than 2 seconds.  Neurological:     Mental Status: She is alert and oriented to person, place, and time.  Psychiatric:        Mood and Affect: Mood normal.        Behavior: Behavior normal.     ED Results / Procedures / Treatments   Labs (all labs ordered are listed, but only abnormal results are displayed) Labs Reviewed  BASIC METABOLIC PANEL - Abnormal; Notable for the following components:      Result Value   Glucose, Bld 105 (*)    Calcium 8.6 (*)    All other components within normal limits  RESP PANEL BY RT-PCR (RSV, FLU A&B, COVID)  RVPGX2  CBC  HIV ANTIBODY (ROUTINE TESTING W REFLEX)  BASIC METABOLIC PANEL  CBC    EKG None  Radiology DG Chest  2 View  Result Date: 08/24/2023 CLINICAL DATA:  Shortness of breath EXAM: CHEST - 2 VIEW COMPARISON:  Chest x-ray 12/23/2022 FINDINGS: The heart size and mediastinal contours are within normal limits. Both lungs are clear. The visualized skeletal structures are unremarkable. IMPRESSION: No active cardiopulmonary disease. Electronically Signed   By: Darliss Cheney M.D.   On: 08/24/2023 16:04    Procedures Procedures    Medications Ordered in ED Medications  montelukast (SINGULAIR) tablet 10 mg (has no administration in time range)  mometasone-formoterol (DULERA) 200-5 MCG/ACT inhaler 2 puff (has no administration in time range)  enoxaparin (LOVENOX) injection 60 mg (has no administration in time range)  acetaminophen (TYLENOL) tablet 650 mg (has no administration in time range)    Or  acetaminophen (TYLENOL) suppository 650 mg (has no administration in time range)  traZODone (DESYREL) tablet 25 mg (has no administration in time range)  ondansetron (ZOFRAN) tablet 4 mg (has no administration in time range)    Or  ondansetron (ZOFRAN) injection 4 mg (has no administration in time range)  albuterol (PROVENTIL) (2.5 MG/3ML) 0.083% nebulizer solution 2.5 mg (has no administration in time range)  methylPREDNISolone sodium succinate (SOLU-MEDROL) 40 mg/mL injection 40 mg (has no administration in time range)  methylPREDNISolone sodium succinate (SOLU-MEDROL) 125 mg/2 mL injection 125 mg (125 mg Intravenous Given 08/24/23 1428)  ipratropium-albuterol (DUONEB) 0.5-2.5 (3) MG/3ML nebulizer solution 3 mL (3 mLs Nebulization Given 08/24/23 1447)  magnesium sulfate IVPB 2 g 50 mL (0 g Intravenous Stopped 08/24/23 1529)  albuterol (PROVENTIL) (2.5 MG/3ML) 0.083% nebulizer solution (10 mg/hr Nebulization Given 08/24/23 1601)  albuterol (PROVENTIL) (2.5 MG/3ML) 0.083% nebulizer solution (10 mg/hr Nebulization Given 08/24/23 1742)    ED Course/ Medical Decision Making/ A&P Clinical Course as of 08/24/23 1929   Tue Aug 24, 2023  1543 Ongoing wheezing, will try continuous neb [CP]    Clinical Course User Index [CP] Olene Floss, PA-C                                 Medical Decision Making Amount and/or Complexity of Data Reviewed Labs: ordered. Radiology: ordered.  Risk Prescription drug management. Decision regarding hospitalization.   This patient is a 22 y.o. female who presents to the ED for concern of shob, this involves an extensive number of treatment options, and is a complaint that carries with it a high risk of complications and morbidity. The emergent differential diagnosis prior  to evaluation includes, but is not limited to,  asthma exacerbation, COPD exacerbation, acute upper respiratory infection, acute bronchitis, chronic bronchitis, interstitial lung disease, ARDS, PE, pneumonia, atypical ACS, carbon monoxide poisoning, spontaneous pneumothorax, new CHF vs CHF exacerbation, versus other . This is not an exhaustive differential.   Past Medical History / Co-morbidities / Social History: Asthma, obesity, sleep apnea, bipolar disorder  Additional history: Chart reviewed. Pertinent results include: Reviewed lab work, imaging from previous emergency department visits  Physical Exam: Physical exam performed. The pertinent findings include: Patient arrives in respiratory distress, with diffuse biphasic wheezing, shallow respiratory effort, without significant rhonchi or focal consolidation.  She has some mild tachycardia on arrival which worsened after albuterol, her wheezing has improved somewhat after DuoNeb and continuous neb but still having biphasic wheezing and increased work of breathing.  She is afebrile and has maintained stable oxygen saturation on room air.  Lab Tests: I ordered, and personally interpreted labs.  The pertinent results include: CBC unremarkable, BMP overall unremarkable, her RVP is negative for COVID, flu, RSV.    Imaging Studies: I ordered  imaging studies including plain film chest x-ray. I independently visualized and interpreted imaging which showed no acute intrathoracic abnormality. I agree with the radiologist interpretation.   Cardiac Monitoring:  The patient was maintained on a cardiac monitor.  My attending physician Dr. Jeraldine Loots viewed and interpreted the cardiac monitored which showed an underlying rhythm of: NSR. I agree with this interpretation.   Medications: I ordered medication including Solu-Medrol, magnesium, DuoNeb, continuous neb x 2 for acute respiratory distress secondary to asthma exacerbation.  Patient with ongoing wheezing despite treatments as above  Consultations Obtained: I requested consultation with the hospitalist, spoke with Dr. Kirby Crigler,  and discussed lab and imaging findings as well as pertinent plan - they recommend: Admission for acute asthma exacerbation   Disposition: After consideration of the diagnostic results and the patients response to treatment, I feel that patient would benefit from admission for acute asthma exacerbation as discussed above.   I discussed this case with my attending physician Dr. Doran Durand who cosigned this note including patient's presenting symptoms, physical exam, and planned diagnostics and interventions. Attending physician stated agreement with plan or made changes to plan which were implemented.    Final Clinical Impression(s) / ED Diagnoses Final diagnoses:  Severe persistent asthma with exacerbation    Rx / DC Orders ED Discharge Orders     None         [place] 08/24/23 1929    Gerhard Munch, MD 08/25/23 1112

## 2023-08-24 NOTE — ED Notes (Signed)
Admitting MD and RT at bedside

## 2023-08-24 NOTE — ED Triage Notes (Addendum)
Patient has been short of breath since last night. Has asthma. Took 2 puffs of albuterol today. No relief. Unable to speak in full sentences. Labored breathing at rest. Coughing up yellow mucus.

## 2023-08-24 NOTE — H&P (Addendum)
History and Physical  Whitney Beard BJY:[id number] DOB: 2001-08-03 DOA: 08/24/2023  PCP: Pcp, No   Chief Complaint: Cough, shortness of breath  HPI: Whitney Beard is a 22 y.o. female with medical history significant for severe persistent asthma as well as morbid obesity being admitted to the hospital with asthma exacerbation.  She states that this past week she has been staying with her sister, who has mold in her apartment since the floor gets wet whenever it rains.  She started having shortness of breath since last night, has been coughing all day, took some albuterol inhalers today with no relief, denies any fevers, chest pain, vomiting, has been coughing up some clear yellow sputum.  ED Course: In the emergency department, patient has been afebrile, has been persistently tachycardic, saturating well on room air.  She continues to be tachycardic despite a couple of continuous nebs, and IV Solu-Medrol.  Lab work and imaging including CBC, BMP, respiratory viral panel all unremarkable.  Due to continued wheezing and rhonchi, hospitalist observation requested.  Review of Systems: Please see HPI for pertinent positives and negatives. A complete 10 system review of systems are otherwise negative.  Past Medical History:  Diagnosis Date   ADHD (attention deficit hyperactivity disorder)    Allergic rhinitis    Asthma    Bipolar 1 disorder (HCC)    Diabetes mellitus without complication (HCC)    states today 11/11/20 "was told in the past that she was pre- diabetic"   Obesity    Sleep apnea    Suicide attempt [facility])    Suicide attempt by drug ingestion [facility])    Past Surgical History:  Procedure Laterality Date   ADENOIDECTOMY     TONSILLECTOMY      Social History:  reports that she has been smoking cigarettes. She has never used smokeless tobacco. She reports current alcohol use. She reports current drug use. Drug: Marijuana.   Allergies  Allergen Reactions   Other Shortness Of Breath     Animals such as cats and dogs and pollen - sneezing, asthma trigger     Family History  Problem Relation Age of Onset   Bipolar disorder Father    Schizophrenia Father    Liver disease Father    Asthma Father    Asthma Maternal Grandmother    Schizophrenia Maternal Grandmother    SIDS Maternal Aunt    Heart disease Paternal Grandmother    Diabetes Paternal Grandmother    Seizures Paternal Grandmother      Prior to Admission medications   Medication Sig Start Date End Date Taking? Authorizing Provider  acetaminophen (TYLENOL) 500 MG tablet Take 1 tablet (500 mg total) by mouth every 6 (six) hours as needed for headache. 12/23/22   Rolly Salter, MD  albuterol (PROVENTIL) (2.5 MG/3ML) 0.083% nebulizer solution Take 3 mLs (2.5 mg total) by nebulization every 4 (four) hours as needed for wheezing or shortness of breath. 12/23/22 12/23/23  Rolly Salter, MD  albuterol (VENTOLIN HFA) 108 (90 Base) MCG/ACT inhaler Inhale 2 puffs into the lungs every 4 (four) hours as needed for wheezing or shortness of breath. 12/23/22   Rolly Salter, MD  ibuprofen (ADVIL) 600 MG tablet Take 1 tablet (600 mg total) by mouth every 6 (six) hours as needed. 07/04/23   Sherian Maroon A, PA  montelukast (SINGULAIR) 10 MG tablet Take 1 tablet (10 mg total) by mouth at bedtime. 12/23/22   Rolly Salter, MD  Nebulizers (COMPRESSOR/NEBULIZER) MISC 1 each by Does  not apply route as needed. 12/23/22   Rolly Salter, MD  predniSONE (DELTASONE) 10 MG tablet Take 40mg  daily for 3days,Take 30mg  daily for 3days,Take 20mg  daily for 3days,Take 10mg  daily for 3days, then stop 12/23/22   Rolly Salter, MD    Physical Exam: BP (!) 147/120 Comment: nurse notified  Pulse (!) 133   Temp 98.7 F (37.1 C) (Oral)   Resp 20   Ht 5\' 6"  (1.676 m)   Wt 122 kg   SpO2 94%   BMI 43.41 kg/m   General: Patient is alert, well-oriented, speaking in full sentences, anxious and tearful  Eyes: EOMI, clear conjuctivae, white  sclerea Neck: supple, no masses, trachea mildline  Cardiovascular: RRR, no murmurs or rubs, no peripheral edema  Respiratory: Clear to auscultation bilaterally with diffuse rhonchi, minimal end expiratory wheezing, no tachypnea or retractions Abdomen: soft, nontender, nondistended, normal bowel tones heard  Skin: dry, no rashes  Musculoskeletal: no joint effusions, normal range of motion  Psychiatric: appropriate affect, normal speech  Neurologic: extraocular muscles intact, clear speech, moving all extremities with intact sensorium         Labs on Admission:  Basic Metabolic Panel: Recent Labs  Lab 08/24/23 1430  NA 137  K 3.6  CL 106  CO2 22  GLUCOSE 105*  BUN 10  CREATININE 0.61  CALCIUM 8.6*   Liver Function Tests: No results for input(s): "AST", "ALT", "ALKPHOS", "BILITOT", "PROT", "ALBUMIN" in the last 168 hours. No results for input(s): "LIPASE", "AMYLASE" in the last 168 hours. No results for input(s): "AMMONIA" in the last 168 hours. CBC: Recent Labs  Lab 08/24/23 1430  WBC 6.1  HGB 12.8  HCT 38.1  MCV 96.0  PLT 272   Cardiac Enzymes: No results for input(s): "CKTOTAL", "CKMB", "CKMBINDEX", "TROPONINI" in the last 168 hours.  BNP (last 3 results) No results for input(s): "BNP" in the last 8760 hours.  ProBNP (last 3 results) No results for input(s): "PROBNP" in the last 8760 hours.  CBG: No results for input(s): "GLUCAP" in the last 168 hours.  Radiological Exams on Admission: DG Chest 2 View  Result Date: 08/24/2023 CLINICAL DATA:  Shortness of breath EXAM: CHEST - 2 VIEW COMPARISON:  Chest x-ray 12/23/2022 FINDINGS: The heart size and mediastinal contours are within normal limits. Both lungs are clear. The visualized skeletal structures are unremarkable. IMPRESSION: No active cardiopulmonary disease. Electronically Signed   By: Darliss Cheney M.D.   On: 08/24/2023 16:04    Assessment/Plan This is a pleasant 22 year old female with a history of  obesity and severe persistent asthma being admitted to the hospital with recurrent asthma exacerbation potentially triggered by mold exposure.  Asthma exacerbation-with cough, wheezing/rhonchi, no evidence of acute infection.  After admission to the hospital, I was able to speak with the patient's mother at the bedside, she is concerned as the patient is complaining of chest tightness, pleuritic chest pain, which they state she has never had before.  She is also persistently tachycardic.  I feel this could be related to her asthma exacerbation, anxiety, even her albuterol treatments. -Inpatient admission -Dulera twice daily, Singulair -Monitor closely, currently on room air not requiring BiPAP or oxygen supplementation -IV Solu-Medrol 40 mg every 12 hours -Received IV magnesium in the ER -Albuterol neb every 2 hours as needed - Could consider routine pulmonology consultation in the morning  Anxiety-I think this is playing a clear role, causing her tachycardia and worsening her sensation of shortness of breath.  On previous  admission, patient required IV Ativan to the point of sedation. -Ativan 1 mg p.o. every 4 hours as needed for anxiety  Continued tachycardia and chest tightness--check CT angio chest with and without contrast to rule out PE, dissection, etc. though these are not highly suspected.  Coronary syndrome is considered but extremely unlikely, EKG personally reviewed without acute changes.  Bipolar disorder-resume home medications once reconciled  Morbid obesity-BMI 43.41, complicating all aspects of care  DVT prophylaxis: Lovenox     Code Status: Full Code  Consults called: None  Admission status: The appropriate patient status for this patient is INPATIENT. Inpatient status is judged to be reasonable and necessary in order to provide the required intensity of service to ensure the patient's safety. The patient's presenting symptoms, physical exam findings, and initial  radiographic and laboratory data in the context of their chronic comorbidities is felt to place them at high risk for further clinical deterioration. Furthermore, it is not anticipated that the patient will be medically stable for discharge from the hospital within 2 midnights of admission.    I certify that at the point of admission it is my clinical judgment that the patient will require inpatient hospital care spanning beyond 2 midnights from the point of admission due to high intensity of service, high risk for further deterioration and high frequency of surveillance required  Time spent: 49 minutes  Yena Tisby Sharlette Dense MD [facility] Pager [phone]  If 7PM-7AM, please contact night-coverage [email or web address] Password [facility]  08/24/2023, 7:26 PM

## 2023-08-25 ENCOUNTER — Telehealth: Payer: Self-pay | Admitting: Pulmonary Disease

## 2023-08-25 DIAGNOSIS — J4551 Severe persistent asthma with (acute) exacerbation: Secondary | ICD-10-CM | POA: Diagnosis not present

## 2023-08-25 DIAGNOSIS — F419 Anxiety disorder, unspecified: Secondary | ICD-10-CM | POA: Diagnosis not present

## 2023-08-25 DIAGNOSIS — Z6841 Body Mass Index (BMI) 40.0 and over, adult: Secondary | ICD-10-CM

## 2023-08-25 DIAGNOSIS — F319 Bipolar disorder, unspecified: Secondary | ICD-10-CM | POA: Diagnosis not present

## 2023-08-25 DIAGNOSIS — R000 Tachycardia, unspecified: Secondary | ICD-10-CM | POA: Diagnosis present

## 2023-08-25 LAB — BASIC METABOLIC PANEL
Anion gap: 7 (ref 5–15)
BUN: 8 mg/dL (ref 6–20)
CO2: 22 mmol/L (ref 22–32)
Calcium: 8.9 mg/dL (ref 8.9–10.3)
Chloride: 106 mmol/L (ref 98–111)
Creatinine, Ser: 0.59 mg/dL (ref 0.44–1.00)
GFR, Estimated: >60 mL/min (ref >60)
Glucose, Bld: 131 mg/dL — ABNORMAL HIGH (ref 70–99)
Potassium: 4.2 mmol/L (ref 3.5–5.1)
Sodium: 135 mmol/L (ref 135–145)

## 2023-08-25 LAB — CBC
HCT: 38.9 % (ref 36.0–46.0)
Hemoglobin: 13 g/dL (ref 12.0–15.0)
MCH: 31.6 pg (ref 26.0–34.0)
MCHC: 33.4 g/dL (ref 30.0–36.0)
MCV: 94.4 fL (ref 80.0–100.0)
Platelets: 302 10*3/uL (ref 150–400)
RBC: 4.12 MIL/uL (ref 3.87–5.11)
RDW: 12.9 % (ref 11.5–15.5)
WBC: 10.4 10*3/uL (ref 4.0–10.5)
nRBC: 0 % (ref 0.0–0.2)

## 2023-08-25 LAB — HIV ANTIBODY (ROUTINE TESTING W REFLEX): HIV Screen 4th Generation wRfx: NONREACTIVE

## 2023-08-25 LAB — MRSA NEXT GEN BY PCR, NASAL: MRSA by PCR Next Gen: NOT DETECTED

## 2023-08-25 MED ORDER — IPRATROPIUM BROMIDE 0.02 % IN SOLN: 0.5000 mg | Freq: Four times a day (QID) | RESPIRATORY_TRACT | Status: DC

## 2023-08-25 MED ORDER — METHYLPREDNISOLONE SODIUM SUCC 125 MG IJ SOLR
80.0000 mg | Freq: Three times a day (TID) | INTRAMUSCULAR | Status: DC
  Administered 2023-08-25 – 2023-08-26 (×3): 80 mg via INTRAVENOUS
  Filled 2023-08-25 (×3): qty 2

## 2023-08-25 MED ORDER — IPRATROPIUM-ALBUTEROL 0.5-2.5 (3) MG/3ML IN SOLN: 3.0000 mL | RESPIRATORY_TRACT | Status: DC

## 2023-08-25 MED ORDER — FLUTICASONE PROPIONATE 50 MCG/ACT NA SUSP
2.0000 | Freq: Every day | NASAL | Status: DC
  Administered 2023-08-25 – 2023-08-26 (×2): 2 via NASAL
  Filled 2023-08-25: qty 16

## 2023-08-25 MED ORDER — ALBUTEROL SULFATE (2.5 MG/3ML) 0.083% IN NEBU
2.5000 mg | INHALATION_SOLUTION | RESPIRATORY_TRACT | Status: DC
  Administered 2023-08-25: 2.5 mg via RESPIRATORY_TRACT
  Filled 2023-08-25: qty 3

## 2023-08-25 MED ORDER — ARFORMOTEROL TARTRATE 15 MCG/2ML IN NEBU
15.0000 ug | INHALATION_SOLUTION | Freq: Two times a day (BID) | RESPIRATORY_TRACT | Status: DC
  Administered 2023-08-25 – 2023-08-28 (×7): 15 ug via RESPIRATORY_TRACT
  Filled 2023-08-25 (×7): qty 2

## 2023-08-25 MED ORDER — LURASIDONE HCL 40 MG PO TABS
40.0000 mg | ORAL_TABLET | Freq: Every day | ORAL | Status: DC
  Administered 2023-08-25 – 2023-08-28 (×4): 40 mg via ORAL
  Filled 2023-08-25 (×4): qty 1

## 2023-08-25 MED ORDER — HYDROCODONE BIT-HOMATROP MBR 5-1.5 MG/5ML PO SOLN
5.0000 mL | Freq: Four times a day (QID) | ORAL | Status: DC | PRN
  Administered 2023-08-25: 5 mL via ORAL
  Filled 2023-08-25: qty 5

## 2023-08-25 MED ORDER — SERTRALINE HCL 100 MG PO TABS: 50.0000 mg | ORAL_TABLET | Freq: Every day | ORAL | Status: DC

## 2023-08-25 MED ORDER — BUDESONIDE 0.5 MG/2ML IN SUSP
0.5000 mg | Freq: Two times a day (BID) | RESPIRATORY_TRACT | Status: DC
  Administered 2023-08-25 – 2023-08-28 (×7): 0.5 mg via RESPIRATORY_TRACT
  Filled 2023-08-25 (×7): qty 2

## 2023-08-25 MED ORDER — IPRATROPIUM-ALBUTEROL 0.5-2.5 (3) MG/3ML IN SOLN
3.0000 mL | Freq: Four times a day (QID) | RESPIRATORY_TRACT | Status: DC
  Administered 2023-08-26 – 2023-08-28 (×10): 3 mL via RESPIRATORY_TRACT
  Filled 2023-08-25 (×11): qty 3

## 2023-08-25 MED ORDER — IPRATROPIUM BROMIDE 0.02 % IN SOLN
0.5000 mg | RESPIRATORY_TRACT | Status: DC
  Administered 2023-08-25 (×4): 0.5 mg via RESPIRATORY_TRACT
  Filled 2023-08-25 (×4): qty 2.5

## 2023-08-25 MED ORDER — LURASIDONE HCL 40 MG PO TABS: 40.0000 mg | ORAL_TABLET | Freq: Every day | ORAL | Status: DC

## 2023-08-25 MED ORDER — MORPHINE SULFATE (PF) 2 MG/ML IV SOLN
1.0000 mg | Freq: Once | INTRAVENOUS | Status: AC
  Administered 2023-08-25: 1 mg via INTRAVENOUS
  Filled 2023-08-25: qty 1

## 2023-08-25 MED ORDER — PHENOL 1.4 % MT LIQD
1.0000 | OROMUCOSAL | Status: DC | PRN
  Administered 2023-08-25: 1 via OROMUCOSAL
  Filled 2023-08-25: qty 177

## 2023-08-25 MED ORDER — PANTOPRAZOLE SODIUM 40 MG PO TBEC
40.0000 mg | DELAYED_RELEASE_TABLET | Freq: Every day | ORAL | Status: DC
  Administered 2023-08-25 – 2023-08-28 (×4): 40 mg via ORAL
  Filled 2023-08-25 (×4): qty 1

## 2023-08-25 MED ORDER — MELOXICAM 15 MG PO TABS
15.0000 mg | ORAL_TABLET | Freq: Every day | ORAL | Status: DC
  Administered 2023-08-25 – 2023-08-28 (×4): 15 mg via ORAL
  Filled 2023-08-25 (×4): qty 1

## 2023-08-25 MED ORDER — LORATADINE 10 MG PO TABS
10.0000 mg | ORAL_TABLET | Freq: Every day | ORAL | Status: DC
  Administered 2023-08-25 – 2023-08-28 (×4): 10 mg via ORAL
  Filled 2023-08-25 (×4): qty 1

## 2023-08-25 MED ORDER — LEVALBUTEROL HCL 0.63 MG/3ML IN NEBU
0.6300 mg | INHALATION_SOLUTION | RESPIRATORY_TRACT | Status: DC | PRN
  Administered 2023-08-26 – 2023-08-28 (×2): 0.63 mg via RESPIRATORY_TRACT
  Filled 2023-08-25 (×2): qty 3

## 2023-08-25 MED ORDER — LEVALBUTEROL HCL 0.63 MG/3ML IN NEBU
0.6300 mg | INHALATION_SOLUTION | RESPIRATORY_TRACT | Status: DC
  Administered 2023-08-25 (×4): 0.63 mg via RESPIRATORY_TRACT
  Filled 2023-08-25 (×4): qty 3

## 2023-08-25 MED ORDER — SERTRALINE HCL 50 MG PO TABS
50.0000 mg | ORAL_TABLET | Freq: Every day | ORAL | Status: DC
  Administered 2023-08-25 – 2023-08-28 (×4): 50 mg via ORAL
  Filled 2023-08-25 (×4): qty 1

## 2023-08-25 MED ORDER — ORAL CARE MOUTH RINSE: 15.0000 mL | OROMUCOSAL | Status: DC | PRN

## 2023-08-25 MED ORDER — BENZONATATE 100 MG PO CAPS
200.0000 mg | ORAL_CAPSULE | Freq: Three times a day (TID) | ORAL | Status: DC
  Administered 2023-08-25 – 2023-08-28 (×10): 200 mg via ORAL
  Filled 2023-08-25 (×10): qty 2

## 2023-08-25 MED ORDER — CHLORHEXIDINE GLUCONATE CLOTH 2 % EX PADS
6.0000 | MEDICATED_PAD | Freq: Every day | CUTANEOUS | Status: DC
  Administered 2023-08-25 (×2): 6 via TOPICAL

## 2023-08-25 NOTE — Plan of Care (Signed)
Problem: Education: Goal: Knowledge of General Education information will improve Description: Including pain rating scale, medication(s)/side effects and non-pharmacologic comfort measures Outcome: Progressing   Problem: Clinical Measurements: Goal: Respiratory complications will improve Outcome: Progressing   Problem: Coping: Goal: Level of anxiety will decrease Outcome: Progressing

## 2023-08-25 NOTE — Progress Notes (Addendum)
PROGRESS NOTE    Whitney Beard  ZOX:[id number] DOB: [birth year] DOA: 08/24/2023 PCP: Pcp, No   Chief Complaint  Patient presents with   Shortness of Breath    Brief Narrative:  Patient is a pleasant 22 year old female history of severe persistent asthma, morbid obesity presented to the ED and admitted for acute asthma exacerbation.  Patient was placed on Dulera, scheduled nebs, IV steroids.  Overnight on 08/24/2023 patient noted to have increasing respiratory rate, use of accessory muscles of respiration, ongoing diffuse wheezing despite nebulizer treatment and subsequently transferred to the stepdown unit for closer monitoring.   Assessment & Plan:   Active Problems:   Class 3 severe obesity due to excess calories with serious comorbidity and body mass index (BMI) of 50.0 to 59.9 in adult [facility])   Bipolar 1 disorder (HCC)   Anxiety   Major depressive disorder   Asthma exacerbation   Tachycardia, unspecified  #1 acute asthma exacerbation -Patient with a 1 day history of worsening shortness of breath cough, diffuse wheezing, no evidence of infection noted. -Chest x-ray done negative for any acute abnormalities. -CT angiogram chest done negative for PE, small amount of pneumomediastinum of uncertain etiology noted. -Due to worsening respiratory status with increased wheezing, increased respiratory rate and increased work of breathing the early hours of 08/25/2023, patient transferred to the stepdown unit overnight for closer monitoring. -Will discontinue albuterol and Atrovent nebs and placed on Xopenex and Atrovent scheduled nebs as patient noted to be tachycardic. -Discontinue Dulera placed on Pulmicort and Brovana. -Place on Claritin, PPI, Flonase, Tessalon Perles. -Hycodan as needed. -Increase IV Solu-Medrol to 80 mg IV every 8 hours. -BiPAP nightly and as needed. -Consult with pulmonary for further evaluation and management.  2.  Anxiety -Continue Ativan 1 mg IV every 4 hours as  needed.  3.  Bipolar disorder/MDD -Stable -Resume home regimen Latuda, Zoloft.  4.  Tachycardia chest tightness -Likely secondary to problem #1. -CT angiogram chest negative for PE or dissection. -EKG with no ischemic changes noted. -Change scheduled albuterol to Xopenex nebs.  5.  Morbid obesity -BMI 43.1 -Lifestyle modification -Outpatient follow-up with PCP.   DVT prophylaxis: Lovenox Code Status: Full Family Communication: Updated patient.  No family at bedside. Disposition: Home when clinically improved.  Status is: Inpatient Remains inpatient appropriate because: Severity of illness   Consultants:  None  Procedures:  CT angiogram chest 08/24/2023 Chest x-ray 08/24/2023  Antimicrobials: Anti-infectives (From admission, onward)    None         Subjective: Patient laying on bed.  Denies any chest pain.  Feels shortness of breath and wheezing has improved since admission.  Still with complaints of cough with clear/yellow sputum.  Denies any CPAP use at home.  On 2 L nasal cannula with sats of 97 to 98%.  Patient denies any sick contacts.  States this is her first exacerbation this year. Events overnight noted.  Objective: Vitals:   08/25/23 0500 08/25/23 0625 08/25/23 0800 08/25/23 0825  BP: (!) 151/105 (!) 169/95    Pulse: (!) 103 90    Resp: (!) 31 (!) 28    Temp:   97.9 F (36.6 C)   TempSrc:   Oral   SpO2: 99% 98%  95%  Weight:      Height:       No intake or output data in the 24 hours ending 08/25/23 1012 Filed Weights   08/24/23 1317 08/25/23 0440  Weight: 122 kg (!) 146.8 kg    Examination:  General exam: Appears calm and comfortable  Respiratory system: Diffuse wheezing noted.  No crackles.  No significant rhonchi noted.  No significant use of accessory muscles of respiration.  Cardiovascular system: S1 & S2 heard, RRR. No JVD, murmurs, rubs, gallops or clicks. No pedal edema. Gastrointestinal system: Abdomen is nondistended, soft and  nontender. No organomegaly or masses felt. Normal bowel sounds heard. Central nervous system: Alert and oriented. No focal neurological deficits. Extremities: Symmetric 5 x 5 power. Skin: No rashes, lesions or ulcers Psychiatry: Judgement and insight appear normal. Mood & affect appropriate.     Data Reviewed: I have personally reviewed following labs and imaging studies  CBC: Recent Labs  Lab 08/24/23 1430 08/25/23 0543  WBC 6.1 10.4  HGB 12.8 13.0  HCT 38.1 38.9  MCV 96.0 94.4  PLT 272 302    Basic Metabolic Panel: Recent Labs  Lab 08/24/23 1430 08/25/23 0543  NA 137 135  K 3.6 4.2  CL 106 106  CO2 22 22  GLUCOSE 105* 131*  BUN 10 8  CREATININE 0.61 0.59  CALCIUM 8.6* 8.9    GFR: Estimated Creatinine Clearance: 165.2 mL/min (by C-G formula based on SCr of 0.59 mg/dL).  Liver Function Tests: No results for input(s): "AST", "ALT", "ALKPHOS", "BILITOT", "PROT", "ALBUMIN" in the last 168 hours.  CBG: No results for input(s): "GLUCAP" in the last 168 hours.   Recent Results (from the past 240 hour(s))  Resp panel by RT-PCR (RSV, Flu A&B, Covid) Anterior Nasal Swab     Status: None   Collection Time: 08/24/23  2:14 PM   Specimen: Anterior Nasal Swab  Result Value Ref Range Status   SARS Coronavirus 2 by RT PCR NEGATIVE NEGATIVE Final    Comment: (NOTE) SARS-CoV-2 target nucleic acids are NOT DETECTED.  The SARS-CoV-2 RNA is generally detectable in upper respiratory specimens during the acute phase of infection. The lowest concentration of SARS-CoV-2 viral copies this assay can detect is 138 copies/mL. A negative result does not preclude SARS-Cov-2 infection and should not be used as the sole basis for treatment or other patient management decisions. A negative result may occur with  improper specimen collection/handling, submission of specimen other than nasopharyngeal swab, presence of viral mutation(s) within the areas targeted by this assay, and  inadequate number of viral copies(<138 copies/mL). A negative result must be combined with clinical observations, patient history, and epidemiological information. The expected result is Negative.  Fact Sheet for Patients:  BloggerCourse.com  Fact Sheet for Healthcare Providers:  SeriousBroker.it  This test is no t yet approved or cleared by the [place] FDA and  has been authorized for detection and/or diagnosis of SARS-CoV-2 by FDA under an Emergency Use Authorization (EUA). This EUA will remain  in effect (meaning this test can be used) for the duration of the COVID-19 declaration under Section 564(b)(1) of the Act, 21 U.S.C.section 360bbb-3(b)(1), unless the authorization is terminated  or revoked sooner.       Influenza A by PCR NEGATIVE NEGATIVE Final   Influenza B by PCR NEGATIVE NEGATIVE Final    Comment: (NOTE) The Xpert Xpress SARS-CoV-2/FLU/RSV plus assay is intended as an aid in the diagnosis of influenza from Nasopharyngeal swab specimens and should not be used as a sole basis for treatment. Nasal washings and aspirates are unacceptable for Xpert Xpress SARS-CoV-2/FLU/RSV testing.  Fact Sheet for Patients: BloggerCourse.com  Fact Sheet for Healthcare Providers: SeriousBroker.it  This test is not yet approved or cleared by the [place] FDA  and has been authorized for detection and/or diagnosis of SARS-CoV-2 by FDA under an Emergency Use Authorization (EUA). This EUA will remain in effect (meaning this test can be used) for the duration of the COVID-19 declaration under Section 564(b)(1) of the Act, 21 U.S.C. section 360bbb-3(b)(1), unless the authorization is terminated or revoked.     Resp Syncytial Virus by PCR NEGATIVE NEGATIVE Final    Comment: (NOTE) Fact Sheet for Patients: BloggerCourse.com  Fact Sheet for Healthcare  Providers: SeriousBroker.it  This test is not yet approved or cleared by the [place] FDA and has been authorized for detection and/or diagnosis of SARS-CoV-2 by FDA under an Emergency Use Authorization (EUA). This EUA will remain in effect (meaning this test can be used) for the duration of the COVID-19 declaration under Section 564(b)(1) of the Act, 21 U.S.C. section 360bbb-3(b)(1), unless the authorization is terminated or revoked.  Performed at [facility], [address], [place], [place] [address]   MRSA Next Gen by PCR, Nasal     Status: None   Collection Time: 08/25/23  4:35 AM   Specimen: Nasal Mucosa; Nasal Swab  Result Value Ref Range Status   MRSA by PCR Next Gen NOT DETECTED NOT DETECTED Final    Comment: (NOTE) The GeneXpert MRSA Assay (FDA approved for NASAL specimens only), is one component of a comprehensive MRSA colonization surveillance program. It is not intended to diagnose MRSA infection nor to guide or monitor treatment for MRSA infections. Test performance is not FDA approved in patients less than 74 years old. Performed at [facility], [address] [place], [place] [address]          Radiology Studies: CT Angio Chest Pulmonary Embolism (PE) W or WO Contrast  Result Date: 08/24/2023 CLINICAL DATA:  Shortness of breath.  Asthma. EXAM: CT ANGIOGRAPHY CHEST WITH CONTRAST TECHNIQUE: Multidetector CT imaging of the chest was performed using the standard protocol during bolus administration of intravenous contrast. Multiplanar CT image reconstructions and MIPs were obtained to evaluate the vascular anatomy. RADIATION DOSE REDUCTION: This exam was performed according to the departmental dose-optimization program which includes automated exposure control, adjustment of the mA and/or kV according to patient size and/or use of iterative reconstruction technique. CONTRAST:  OMNIPAQUE  IOHEXOL 350 MG/ML SOLN COMPARISON:  CT PE protocol 11/12/2019 FINDINGS: Cardiovascular: Satisfactory opacification of the pulmonary arteries to the segmental level. No evidence of pulmonary embolism. Normal heart size. No pericardial effusion. Mediastinum/Nodes: There is a small amount of pneumomediastinum diffusely. Esophagus and visualized thyroid gland are within normal limits. No enlarged lymph nodes are identified. No fluid collection or stranding identified. Lungs/Pleura: Lungs are clear. No pleural effusion or pneumothorax. Upper Abdomen: No acute abnormality. Musculoskeletal: No chest wall abnormality. No acute or significant osseous findings. Review of the MIP images confirms the above findings. IMPRESSION: 1. No evidence for pulmonary embolism. 2. Small amount of pneumomediastinum of uncertain etiology. Electronically Signed   By: Darliss Cheney M.D.   On: 08/24/2023 22:40   DG Chest 2 View  Result Date: 08/24/2023 CLINICAL DATA:  Shortness of breath EXAM: CHEST - 2 VIEW COMPARISON:  Chest x-ray 12/23/2022 FINDINGS: The heart size and mediastinal contours are within normal limits. Both lungs are clear. The visualized skeletal structures are unremarkable. IMPRESSION: No active cardiopulmonary disease. Electronically Signed   By: Darliss Cheney M.D.   On: 08/24/2023 16:04        Scheduled Meds:  arformoterol  15 mcg Nebulization BID  benzonatate  200 mg Oral TID   budesonide (PULMICORT) nebulizer solution  0.5 mg Nebulization BID   Chlorhexidine Gluconate Cloth  6 each Topical Q0600   enoxaparin (LOVENOX) injection  60 mg Subcutaneous QHS   fluticasone  2 spray Each Nare Daily   ipratropium  0.5 mg Nebulization Q4H   levalbuterol  0.63 mg Nebulization Q4H   loratadine  10 mg Oral Daily   lurasidone  40 mg Oral Daily   meloxicam  15 mg Oral Daily   methylPREDNISolone (SOLU-MEDROL) injection  40 mg Intravenous Q12H   montelukast  10 mg Oral QHS   pantoprazole  40 mg Oral Q0600    sertraline  50 mg Oral Daily   Continuous Infusions:   LOS: 1 day    Time spent: 40 minutes    Ramiro Harvest, MD [facility]   To contact the attending provider between 7A-7P or the covering provider during after hours 7P-7A, please log into the web site [email or web address] and access using universal [place] password for that web site. If you do not have the password, please call the hospital operator.  08/25/2023, 10:12 AM

## 2023-08-25 NOTE — Telephone Encounter (Signed)
Please make hospital follow-up for asthma exacerbation with APP/me in 2 to 3 weeks

## 2023-08-25 NOTE — Progress Notes (Signed)
   08/25/23 1931  BiPAP/CPAP/SIPAP  BiPAP/CPAP/SIPAP Pt Type Adult  Reason BIPAP/CPAP not in use Non-compliant

## 2023-08-25 NOTE — Care Management (Signed)
Transition of Care [facility]) - Inpatient Brief Assessment   Patient Details  Name: Whitney Beard MRN: [id number] Date of Birth: [birth year]  Transition of Care [facility]) CM/SW Contact:    Lavenia Atlas, RN Phone Number: 08/25/2023, 4:04 PM   Clinical Narrative: Per chart review patient currently in [facility] ICU for asthma exacerbation. No PCP on file.  TOC will follow for discharge needs.    Transition of Care Asessment: Insurance and Status: Insurance coverage has been reviewed Patient has primary care physician: No Home environment has been reviewed: from home with sister Prior level of function:: independent Prior/Current Home Services: No current home services Social Determinants of Health Reivew: SDOH reviewed no interventions necessary Readmission risk has been reviewed: Yes Transition of care needs: no transition of care needs at this time

## 2023-08-25 NOTE — Consult Note (Signed)
NAME:  Whitney Beard, MRN:  [id number], DOB:  2001-07-18, LOS: 1 ADMISSION DATE:  08/24/2023, CONSULTATION DATE:  08/25/2023  REFERRING MD:  Janee Morn, hospitalist, CHIEF COMPLAINT:  shortness of breath   History of Present Illness:  22 year old female with past medical history of bipolar 1 disorder, diabetes mellitus, obesity, sleep apnea, severe persistent asthma who presented to the emergency department on 08/24/23 with shortness of breath. Per EDP note, patient recently moved and has not had access to nebulizer or montelukast. She had been using her rescue inhaler at home without any relief. Noted on arrival she was unable to speak in full sentences and labored at rest. Labs were unremarkable, CXR without acute findings. RVP negative. She was given continuous duonebs, solu-medrol 125mg , magnesium. She had mild improvement but ongoing wheezing. She was admitted to hospitalist. Hospitalist completed CTA PE study which was negative. She had increased WOB 9/25 AM and was transferred to step down. Pulmonary consulted for further evaluation and recommendations.   On my exam, she is resting comfortably in bed on 2LNC satting 93%. Continues to have wheezing bilaterally. Her lung bases are difficulty to auscultate. She is in no acute respiratory distress. Does tell me she has had asthma since childhood. Appears she saw Allergy with [facility] in 2022 and recommended seeing if she would qualify for biologic. At that time recommended continued Dulera BID, lab work. Was also seen by [facility] Pulmonology in 2021 with adequate spirometry. Remote PFTs in 2018 with FVC%pred-pre 110.1, FEV1 %pred 92.8.  She tells me she just moved from [facility]. States she has not had any of her medications this past week except her rescue inhaler. Additionally, her Singulair she states she has not had in 1 month because of pre-authorization issues. Notes she is allergic to all animals and dust. Does not live in a home with animals. She  notes multiple ICU admissions for asthma exacerbations. She has never required intubation.  Pertinent  Medical History  bipolar 1 disorder, diabetes mellitus, obesity, sleep apnea, severe persistent asthma  Significant Hospital Events: Including procedures, antibiotic start and stop dates in addition to other pertinent events   9/24: admitted hospitalist  9/25: transferred to SDU for increased WOB   Interim History / Subjective:  No complaints at this time  Objective   Blood pressure (!) 169/95, pulse 90, temperature 97.9 F (36.6 C), temperature source Oral, resp. rate (!) 28, height 5\' 7"  (1.702 m), weight (!) 146.8 kg, SpO2 95%.       No intake or output data in the 24 hours ending 08/25/23 1336 Filed Weights   08/24/23 1317 08/25/23 0440  Weight: 122 kg (!) 146.8 kg    Examination: General: young female, obese, laying in bed on 2LNC in no acute distress HENT: EOM intact, mucous membranes pink and moist  Lungs: wheezing bilaterally, no rales, rhonchi. No respiratory distress or accessory muscle usage Cardiovascular: s1/s2, distant. No murmur, rub, gallop Abdomen: soft and non-tender Extremities: no pitting edema Neuro: alert, oriented x4. Non focal GU: deferred   Resolved Hospital Problem list    Assessment & Plan:  Acute on chronic asthma exacerbation; severe persistent asthma. Appears her home meds include albuterol PRN, montelukast 10mg  daily, prednisone taper, lurasidone 40mg  daily. CXR without any acute abnormalities. No recent infectious symptoms. CTA negative.  - con't IV solu-medrol 80mg  q8 and taper appropriately. Will need 2 week taper on discharge. - cont' Budesonide, Brovana (ICS+LABA) while in house  - can continue PRN Xopenex, atrovent PRN -  con't Singluair 10mg  daily  - once she is ready to be discharged home, will need to be placed back on an ICS+LABA for more control. Previously on [facility] which is appropriate, but others can be considered.  - we will set  her up with f/u pulmonology appointment outpatient. - recommend f/u with sleep study   Best Practice (right click and "Reselect all SmartList Selections" daily)  Below as per primary team  Diet/type:  DVT prophylaxis:  GI prophylaxis:  Lines:  Foley:   Code Status:   Last date of multidisciplinary goals of care discussion []   Labs   CBC: Recent Labs  Lab 08/24/23 1430 08/25/23 0543  WBC 6.1 10.4  HGB 12.8 13.0  HCT 38.1 38.9  MCV 96.0 94.4  PLT 272 302    Basic Metabolic Panel: Recent Labs  Lab 08/24/23 1430 08/25/23 0543  NA 137 135  K 3.6 4.2  CL 106 106  CO2 22 22  GLUCOSE 105* 131*  BUN 10 8  CREATININE 0.61 0.59  CALCIUM 8.6* 8.9   GFR: Estimated Creatinine Clearance: 165.2 mL/min (by C-G formula based on SCr of 0.59 mg/dL). Recent Labs  Lab 08/24/23 1430 08/25/23 0543  WBC 6.1 10.4    Liver Function Tests: No results for input(s): "AST", "ALT", "ALKPHOS", "BILITOT", "PROT", "ALBUMIN" in the last 168 hours. No results for input(s): "LIPASE", "AMYLASE" in the last 168 hours. No results for input(s): "AMMONIA" in the last 168 hours.  ABG    Component Value Date/Time   HCO3 22.0 10/16/2017 0118   TCO2 23 10/16/2017 0118   ACIDBASEDEF 3.0 (H) 10/16/2017 0118   O2SAT 83.0 10/16/2017 0118     Coagulation Profile: No results for input(s): "INR", "PROTIME" in the last 168 hours.  Cardiac Enzymes: No results for input(s): "CKTOTAL", "CKMB", "CKMBINDEX", "TROPONINI" in the last 168 hours.  HbA1C: Hgb A1c MFr Bld  Date/Time Value Ref Range Status  09/17/2022 09:02 PM 4.7 (L) 4.8 - 5.6 % Final    Comment:    (NOTE) Pre diabetes:          5.7%-6.4%  Diabetes:              >6.4%  Glycemic control for   <7.0% adults with diabetes   03/05/2022 04:14 AM 4.8 4.8 - 5.6 % Final    Comment:    (NOTE) Pre diabetes:          5.7%-6.4%  Diabetes:              >6.4%  Glycemic control for   <7.0% adults with diabetes     CBG: No results for  input(s): "GLUCAP" in the last 168 hours.  Review of Systems:   Per HPI  Past Medical History:  She,  has a past medical history of ADHD (attention deficit hyperactivity disorder), Allergic rhinitis, Asthma, Bipolar 1 disorder (HCC), Diabetes mellitus without complication (HCC), Obesity, Sleep apnea, Suicide attempt (HCC), and Suicide attempt by drug ingestion (HCC).   Surgical History:   Past Surgical History:  Procedure Laterality Date   ADENOIDECTOMY     TONSILLECTOMY       Social History:   reports that she has been smoking cigarettes. She has never used smokeless tobacco. She reports current alcohol use. She reports current drug use. Drug: Marijuana.   Family History:  Her family history includes Asthma in her father and maternal grandmother; Bipolar disorder in her father; Diabetes in her paternal grandmother; Heart disease in her paternal grandmother; Liver disease  in her father; SIDS in her maternal aunt; Schizophrenia in her father and maternal grandmother; Seizures in her paternal grandmother.   Allergies Allergies  Allergen Reactions   Other Shortness Of Breath    Animals such as cats and dogs and pollen - sneezing, asthma trigger      Home Medications  Prior to Admission medications   Medication Sig Start Date End Date Taking? Authorizing Provider  lurasidone (LATUDA) 40 MG TABS tablet Take 40 mg by mouth daily. 11/15/20  Yes [provider]  meloxicam (MOBIC) 15 MG tablet Take 15 mg by mouth daily. 07/12/23  Yes [provider]  predniSONE (DELTASONE) 20 MG tablet Take 20 mg by mouth daily as needed. 04/28/23  Yes [provider]  acetaminophen (TYLENOL) 500 MG tablet Take 1 tablet (500 mg total) by mouth every 6 (six) hours as needed for headache. 12/23/22   Rolly Salter, MD  albuterol (PROVENTIL) (2.5 MG/3ML) 0.083% nebulizer solution Take 3 mLs (2.5 mg total) by nebulization every 4 (four) hours as needed for wheezing or shortness of  breath. 12/23/22 12/23/23  Rolly Salter, MD  albuterol (VENTOLIN HFA) 108 (90 Base) MCG/ACT inhaler Inhale 2 puffs into the lungs every 4 (four) hours as needed for wheezing or shortness of breath. 12/23/22   Rolly Salter, MD  ibuprofen (ADVIL) 600 MG tablet Take 1 tablet (600 mg total) by mouth every 6 (six) hours as needed. 07/04/23   Sherian Maroon A, PA  montelukast (SINGULAIR) 10 MG tablet Take 1 tablet (10 mg total) by mouth at bedtime. 12/23/22   Rolly Salter, MD  Nebulizers (COMPRESSOR/NEBULIZER) MISC 1 each by Does not apply route as needed. 12/23/22   Rolly Salter, MD  predniSONE (DELTASONE) 10 MG tablet Take 40mg  daily for 3days,Take 30mg  daily for 3days,Take 20mg  daily for 3days,Take 10mg  daily for 3days, then stop 12/23/22   Rolly Salter, MD  sertraline (ZOLOFT) 50 MG tablet Take 50 mg by mouth daily.    [provider]     Critical care time:

## 2023-08-25 NOTE — Progress Notes (Signed)
Overnight   NAME: Whitney Beard MRN: [id number] DOB : Sep 08, 2001    Date of Service   08/25/2023   HPI/Events of Note    Notified by RN for increased wheezing, respiratory rate, and work of breathing.  22 year old female with medical history significant for severe persistent asthma as well as morbid obesity admitted through the ER with asthma exacerbation. Patient advised admitting/ER of potential mold exposure.  In spite of nebulizer treatments and oxygen patient continued to have wheezing along with above signs and symptoms.  Due to her history and ongoing wheezing, concern for airway, patient was moved to stepdown unit.    Interventions/ Plan   BiPAP as needed ordered Nebulizer treatments adjusted Transferred to SDU      Update: 0450 hrs. Patient in SDU and some distress appearing as previously noted with ER and admission.  Patient is known to have anxiety with respiratory/asthma exacerbations.  Good improvement seen with nebulizer treatments, morphine, Ativan. Patient is now resting with vital signs  HR 90 -100 RR 24-29 SpO2 99% on 2 LPM [place]  Chinita Greenland BSN MSNA MSN ACNPC-AG Acute Care Nurse Practitioner [facility]

## 2023-08-25 NOTE — Plan of Care (Signed)

## 2023-08-25 NOTE — Plan of Care (Signed)

## 2023-08-26 DIAGNOSIS — J4551 Severe persistent asthma with (acute) exacerbation: Secondary | ICD-10-CM | POA: Diagnosis not present

## 2023-08-26 DIAGNOSIS — F419 Anxiety disorder, unspecified: Secondary | ICD-10-CM | POA: Diagnosis not present

## 2023-08-26 DIAGNOSIS — F319 Bipolar disorder, unspecified: Secondary | ICD-10-CM | POA: Diagnosis not present

## 2023-08-26 LAB — CBC WITH DIFFERENTIAL/PLATELET
Abs Immature Granulocytes: 0.03 10*3/uL (ref 0.00–0.07)
Basophils Absolute: 0 10*3/uL (ref 0.0–0.1)
Basophils Relative: 0 %
Eosinophils Absolute: 0 10*3/uL (ref 0.0–0.5)
Eosinophils Relative: 0 %
HCT: 36.8 % (ref 36.0–46.0)
Hemoglobin: 12.3 g/dL (ref 12.0–15.0)
Immature Granulocytes: 0 %
Lymphocytes Relative: 7 %
Lymphs Abs: 0.7 10*3/uL (ref 0.7–4.0)
MCH: 32.5 pg (ref 26.0–34.0)
MCHC: 33.4 g/dL (ref 30.0–36.0)
MCV: 97.1 fL (ref 80.0–100.0)
Monocytes Absolute: 0.4 10*3/uL (ref 0.1–1.0)
Monocytes Relative: 4 %
Neutro Abs: 9.6 10*3/uL — ABNORMAL HIGH (ref 1.7–7.7)
Neutrophils Relative %: 89 %
Platelets: 303 10*3/uL (ref 150–400)
RBC: 3.79 MIL/uL — ABNORMAL LOW (ref 3.87–5.11)
RDW: 13 % (ref 11.5–15.5)
WBC: 10.8 10*3/uL — ABNORMAL HIGH (ref 4.0–10.5)
nRBC: 0 % (ref 0.0–0.2)

## 2023-08-26 LAB — BASIC METABOLIC PANEL
Anion gap: 8 (ref 5–15)
BUN: 17 mg/dL (ref 6–20)
CO2: 24 mmol/L (ref 22–32)
Calcium: 8.8 mg/dL — ABNORMAL LOW (ref 8.9–10.3)
Chloride: 104 mmol/L (ref 98–111)
Creatinine, Ser: 0.69 mg/dL (ref 0.44–1.00)
GFR, Estimated: >60 mL/min (ref >60)
Glucose, Bld: 128 mg/dL — ABNORMAL HIGH (ref 70–99)
Potassium: 5.1 mmol/L (ref 3.5–5.1)
Sodium: 136 mmol/L (ref 135–145)

## 2023-08-26 LAB — MAGNESIUM: Magnesium: 2.1 mg/dL (ref 1.7–2.4)

## 2023-08-26 MED ORDER — CHLORHEXIDINE GLUCONATE CLOTH 2 % EX PADS: 6.0000 | MEDICATED_PAD | Freq: Every evening | CUTANEOUS | Status: DC

## 2023-08-26 MED ORDER — METHYLPREDNISOLONE SODIUM SUCC 40 MG IJ SOLR
40.0000 mg | Freq: Three times a day (TID) | INTRAMUSCULAR | Status: DC
  Administered 2023-08-26 – 2023-08-27 (×4): 40 mg via INTRAVENOUS
  Filled 2023-08-26 (×4): qty 1

## 2023-08-26 MED ORDER — LORAZEPAM 0.5 MG PO TABS: 0.5000 mg | ORAL_TABLET | Freq: Three times a day (TID) | ORAL | Status: DC | PRN

## 2023-08-26 NOTE — Progress Notes (Signed)
   08/26/23 2004  BiPAP/CPAP/SIPAP  BiPAP/CPAP/SIPAP Pt Type Adult  Reason BIPAP/CPAP not in use Non-compliant

## 2023-08-26 NOTE — Progress Notes (Signed)
Ambulated hall with room air with continuous O2 sat monitoring, was 95-100% tolerated well.  Sitting in the chair now.

## 2023-08-26 NOTE — Progress Notes (Addendum)
PROGRESS NOTE    Whitney Beard  ONG:[id number] DOB: [birth year] DOA: 08/24/2023 PCP: Pcp, No   Chief Complaint  Patient presents with   Shortness of Breath    Brief Narrative:  Patient is a pleasant 22 year old female history of severe persistent asthma, morbid obesity presented to the ED and admitted for acute asthma exacerbation.  Patient was placed on Dulera, scheduled nebs, IV steroids.  Overnight on 08/24/2023 patient noted to have increasing respiratory rate, use of accessory muscles of respiration, ongoing diffuse wheezing despite nebulizer treatment and subsequently transferred to the stepdown unit for closer monitoring.   Assessment & Plan:   Active Problems:   Class 3 severe obesity due to excess calories with serious comorbidity and body mass index (BMI) of 50.0 to 59.9 in adult [facility])   Bipolar 1 disorder (HCC)   Anxiety   Major depressive disorder   Asthma exacerbation   Tachycardia, unspecified  #1 acute asthma exacerbation -Patient with a 1 day history of worsening shortness of breath cough, diffuse wheezing, no evidence of infection noted. -Chest x-ray done negative for any acute abnormalities. -CT angiogram chest done negative for PE, small amount of pneumomediastinum of uncertain etiology noted. -Due to worsening respiratory status with increased wheezing, increased respiratory rate and increased work of breathing the early hours of 08/25/2023, patient transferred to the stepdown unit for closer monitoring. -Patient with clinical improvement. -Continue Brovana, Pulmicort, scheduled DuoNebs, Claritin, PPI, Flonase, Tessalon Perles, Hycodan as needed. -Decrease IV Solu-Medrol to 40 mg every 8 hours. -CPAP nightly. -Patient seen in consultation by pulmonary who are recommending continuation of current treatment plan and on discharge patient may be converted to a steroid/LABA inhaler of choice with options including Advair, Symbicort, Dulera or Virgel Bouquet which ever is covered by  her insurance.  Pulmonary also recommending resumption of montelukast and albuterol MDI use for rescue on discharge. -Will need outpatient follow-up with pulmonary.  2.  Anxiety -Ativan 1 mg IV every 4 hours as needed.   -Will also place as needed oral Ativan as needed.   3.  Bipolar disorder/MDD -Stable -Continue home regimen Zoloft, Latuda.  4.  Tachycardia chest tightness -Likely secondary to problem #1. -CT angiogram chest negative for PE or dissection. -EKG with no ischemic changes noted. -Improving. -Nebs initially changed from albuterol to Xopenex nebs however details have been changed back to DuoNebs.  5.  Morbid obesity -BMI 43.1 -Lifestyle modification -Outpatient follow-up with PCP.  6.  Probable OSA -Outpatient follow-up with pulmonary for outpatient testing.   DVT prophylaxis: Lovenox Code Status: Full Family Communication: Updated patient.  No family at bedside. Disposition: Transfer to progressive care unit.  Home when clinically improved.  Status is: Inpatient Remains inpatient appropriate because: Severity of illness   Consultants:  Pulmonary: Dr. Vassie Loll 08/25/2023  Procedures:  CT angiogram chest 08/24/2023 Chest x-ray 08/24/2023  Antimicrobials: Anti-infectives (From admission, onward)    None         Subjective: Sleeping but easily arousable.  States shortness of breath has improved significantly since admission and feeling much better than she did yesterday.  Denies any chest pain.  Feels wheezing is improved.  States was never offered CPAP overnight however per RN it is noted that patient refused CPAP/BiPAP overnight.  Currently on 1 L nasal cannula with sats of 96%.    Objective: Vitals:   08/26/23 0400 08/26/23 0615 08/26/23 0745 08/26/23 0839  BP: 131/76     Pulse: 69 64    Resp: (!) 21 17  Temp:   98.4 F (36.9 C)   TempSrc:   Oral   SpO2: 97% 97%  95%  Weight:      Height:        Intake/Output Summary (Last 24 hours) at  08/26/2023 1000 Last data filed at 08/26/2023 0900 Gross per 24 hour  Intake 880 ml  Output --  Net 880 ml   Filed Weights   08/24/23 1317 08/25/23 0440 08/25/23 2246  Weight: 122 kg (!) 146.8 kg (!) 147.5 kg    Examination:  General exam: NAD Respiratory system: Decreased expiratory wheezing, some scattered coarse breath sounds/rhonchi.  No crackles.  No use of accessory muscles of respiration.  On 1 L nasal cannula with sats of 96%. Cardiovascular system: Regular rate rhythm no murmurs rubs or gallops.  No JVD.  No pitting lower extremity edema. Gastrointestinal system: Abdomen is soft, nontender, nondistended, positive bowel sounds.  No rebound.  No guarding.  Central nervous system: Alert and oriented. No focal neurological deficits. Extremities: Symmetric 5 x 5 power. Skin: No rashes, lesions or ulcers Psychiatry: Judgement and insight appear normal. Mood & affect appropriate.     Data Reviewed: I have personally reviewed following labs and imaging studies  CBC: Recent Labs  Lab 08/24/23 1430 08/25/23 0543 08/26/23 0247  WBC 6.1 10.4 10.8*  NEUTROABS  --   --  9.6*  HGB 12.8 13.0 12.3  HCT 38.1 38.9 36.8  MCV 96.0 94.4 97.1  PLT 272 302 303    Basic Metabolic Panel: Recent Labs  Lab 08/24/23 1430 08/25/23 0543 08/26/23 0247  NA 137 135 136  K 3.6 4.2 5.1  CL 106 106 104  CO2 22 22 24   GLUCOSE 105* 131* 128*  BUN 10 8 17   CREATININE 0.61 0.59 0.69  CALCIUM 8.6* 8.9 8.8*  MG  --   --  2.1    GFR: Estimated Creatinine Clearance: 165.8 mL/min (by C-G formula based on SCr of 0.69 mg/dL).  Liver Function Tests: No results for input(s): "AST", "ALT", "ALKPHOS", "BILITOT", "PROT", "ALBUMIN" in the last 168 hours.  CBG: No results for input(s): "GLUCAP" in the last 168 hours.   Recent Results (from the past 240 hour(s))  Resp panel by RT-PCR (RSV, Flu A&B, Covid) Anterior Nasal Swab     Status: None   Collection Time: 08/24/23  2:14 PM   Specimen:  Anterior Nasal Swab  Result Value Ref Range Status   SARS Coronavirus 2 by RT PCR NEGATIVE NEGATIVE Final    Comment: (NOTE) SARS-CoV-2 target nucleic acids are NOT DETECTED.  The SARS-CoV-2 RNA is generally detectable in upper respiratory specimens during the acute phase of infection. The lowest concentration of SARS-CoV-2 viral copies this assay can detect is 138 copies/mL. A negative result does not preclude SARS-Cov-2 infection and should not be used as the sole basis for treatment or other patient management decisions. A negative result may occur with  improper specimen collection/handling, submission of specimen other than nasopharyngeal swab, presence of viral mutation(s) within the areas targeted by this assay, and inadequate number of viral copies(<138 copies/mL). A negative result must be combined with clinical observations, patient history, and epidemiological information. The expected result is Negative.  Fact Sheet for Patients:  BloggerCourse.com  Fact Sheet for Healthcare Providers:  SeriousBroker.it  This test is no t yet approved or cleared by the [place] FDA and  has been authorized for detection and/or diagnosis of SARS-CoV-2 by FDA under an Emergency Use Authorization (EUA). This EUA  will remain  in effect (meaning this test can be used) for the duration of the COVID-19 declaration under Section 564(b)(1) of the Act, 21 U.S.C.section 360bbb-3(b)(1), unless the authorization is terminated  or revoked sooner.       Influenza A by PCR NEGATIVE NEGATIVE Final   Influenza B by PCR NEGATIVE NEGATIVE Final    Comment: (NOTE) The Xpert Xpress SARS-CoV-2/FLU/RSV plus assay is intended as an aid in the diagnosis of influenza from Nasopharyngeal swab specimens and should not be used as a sole basis for treatment. Nasal washings and aspirates are unacceptable for Xpert Xpress SARS-CoV-2/FLU/RSV testing.  Fact  Sheet for Patients: BloggerCourse.com  Fact Sheet for Healthcare Providers: SeriousBroker.it  This test is not yet approved or cleared by the [place] FDA and has been authorized for detection and/or diagnosis of SARS-CoV-2 by FDA under an Emergency Use Authorization (EUA). This EUA will remain in effect (meaning this test can be used) for the duration of the COVID-19 declaration under Section 564(b)(1) of the Act, 21 U.S.C. section 360bbb-3(b)(1), unless the authorization is terminated or revoked.     Resp Syncytial Virus by PCR NEGATIVE NEGATIVE Final    Comment: (NOTE) Fact Sheet for Patients: BloggerCourse.com  Fact Sheet for Healthcare Providers: SeriousBroker.it  This test is not yet approved or cleared by the [place] FDA and has been authorized for detection and/or diagnosis of SARS-CoV-2 by FDA under an Emergency Use Authorization (EUA). This EUA will remain in effect (meaning this test can be used) for the duration of the COVID-19 declaration under Section 564(b)(1) of the Act, 21 U.S.C. section 360bbb-3(b)(1), unless the authorization is terminated or revoked.  Performed at [facility], [address], [place], [place] [address]   MRSA Next Gen by PCR, Nasal     Status: None   Collection Time: 08/25/23  4:35 AM   Specimen: Nasal Mucosa; Nasal Swab  Result Value Ref Range Status   MRSA by PCR Next Gen NOT DETECTED NOT DETECTED Final    Comment: (NOTE) The GeneXpert MRSA Assay (FDA approved for NASAL specimens only), is one component of a comprehensive MRSA colonization surveillance program. It is not intended to diagnose MRSA infection nor to guide or monitor treatment for MRSA infections. Test performance is not FDA approved in patients less than 39 years old. Performed at [facility], [address], [place], [place] [address]          Radiology Studies: CT Angio Chest Pulmonary Embolism (PE) W or WO Contrast  Result Date: 08/24/2023 CLINICAL DATA:  Shortness of breath.  Asthma. EXAM: CT ANGIOGRAPHY CHEST WITH CONTRAST TECHNIQUE: Multidetector CT imaging of the chest was performed using the standard protocol during bolus administration of intravenous contrast. Multiplanar CT image reconstructions and MIPs were obtained to evaluate the vascular anatomy. RADIATION DOSE REDUCTION: This exam was performed according to the departmental dose-optimization program which includes automated exposure control, adjustment of the mA and/or kV according to patient size and/or use of iterative reconstruction technique. CONTRAST:  OMNIPAQUE IOHEXOL 350 MG/ML SOLN COMPARISON:  CT PE protocol 11/12/2019 FINDINGS: Cardiovascular: Satisfactory opacification of the pulmonary arteries to the segmental level. No evidence of pulmonary embolism. Normal heart size. No pericardial effusion. Mediastinum/Nodes: There is a small amount of pneumomediastinum diffusely. Esophagus and visualized thyroid gland are within normal limits. No enlarged lymph nodes are identified. No fluid collection or stranding identified. Lungs/Pleura: Lungs are clear. No pleural effusion or pneumothorax. Upper Abdomen: No acute abnormality.  Musculoskeletal: No chest wall abnormality. No acute or significant osseous findings. Review of the MIP images confirms the above findings. IMPRESSION: 1. No evidence for pulmonary embolism. 2. Small amount of pneumomediastinum of uncertain etiology. Electronically Signed   By: Darliss Cheney M.D.   On: 08/24/2023 22:40   DG Chest 2 View  Result Date: 08/24/2023 CLINICAL DATA:  Shortness of breath EXAM: CHEST - 2 VIEW COMPARISON:  Chest x-ray 12/23/2022 FINDINGS: The heart size and mediastinal contours are within normal limits. Both lungs are clear. The visualized skeletal structures are unremarkable.  IMPRESSION: No active cardiopulmonary disease. Electronically Signed   By: Darliss Cheney M.D.   On: 08/24/2023 16:04        Scheduled Meds:  arformoterol  15 mcg Nebulization BID   benzonatate  200 mg Oral TID   budesonide (PULMICORT) nebulizer solution  0.5 mg Nebulization BID   Chlorhexidine Gluconate Cloth  6 each Topical Nightly   enoxaparin (LOVENOX) injection  60 mg Subcutaneous QHS   fluticasone  2 spray Each Nare Daily   ipratropium-albuterol  3 mL Nebulization Q6H   loratadine  10 mg Oral Daily   lurasidone  40 mg Oral Daily   meloxicam  15 mg Oral Daily   methylPREDNISolone (SOLU-MEDROL) injection  80 mg Intravenous Q8H   montelukast  10 mg Oral QHS   pantoprazole  40 mg Oral Q0600   sertraline  50 mg Oral Daily   Continuous Infusions:   LOS: 2 days    Time spent: 40 minutes    Ramiro Harvest, MD [facility]   To contact the attending provider between 7A-7P or the covering provider during after hours 7P-7A, please log into the web site [email or web address] and access using universal  password for that web site. If you do not have the password, please call the hospital operator.  08/26/2023, 10:00 AM

## 2023-08-26 NOTE — Progress Notes (Signed)
NAME:  Whitney Beard, MRN:  [id number], DOB:  2001/10/05, LOS: 2 ADMISSION DATE:  08/24/2023, CONSULTATION DATE:  08/25/2023  REFERRING MD:  Janee Morn, hospitalist, CHIEF COMPLAINT:  shortness of breath   History of Present Illness:  22 year old female with past medical history of bipolar 1 disorder, diabetes mellitus, obesity, sleep apnea, severe persistent asthma who presented to the emergency department on 08/24/23 with shortness of breath. Per EDP note, patient recently moved and has not had access to nebulizer or montelukast. She had been using her rescue inhaler at home without any relief. Noted on arrival she was unable to speak in full sentences and labored at rest. Labs were unremarkable, CXR without acute findings. RVP negative. She was given continuous duonebs, solu-medrol 125mg , magnesium. She had mild improvement but ongoing wheezing. She was admitted to hospitalist. Hospitalist completed CTA PE study which was negative. She had increased WOB 9/25 AM and was transferred to step down. Pulmonary consulted for further evaluation and recommendations.   On my exam, she is resting comfortably in bed on 2LNC satting 93%. Continues to have wheezing bilaterally. Her lung bases are difficulty to auscultate. She is in no acute respiratory distress. Does tell me she has had asthma since childhood. Appears she saw Allergy with [facility] in 2022 and recommended seeing if she would qualify for biologic. At that time recommended continued Dulera BID, lab work. Was also seen by [facility] Pulmonology in 2021 with adequate spirometry. Remote PFTs in 2018 with FVC%pred-pre 110.1, FEV1 %pred 92.8.  She tells me she just moved from [facility]. States she has not had any of her medications this past week except her rescue inhaler. Additionally, her Singulair she states she has not had in 1 month because of pre-authorization issues. Notes she is allergic to all animals and dust. Does not live in a home with animals. She  notes multiple ICU admissions for asthma exacerbations. She has never required intubation.  Pertinent  Medical History  bipolar 1 disorder, diabetes mellitus, obesity, sleep apnea, severe persistent asthma  Significant Hospital Events: Including procedures, antibiotic start and stop dates in addition to other pertinent events   9/24: admitted hospitalist  9/25: transferred to SDU for increased WOB   Interim History / Subjective:   Feels better Receiving neb, otherwise on 1 L nasal cannula  Objective   Blood pressure 131/76, pulse 64, temperature 98.4 F (36.9 C), temperature source Oral, resp. rate 17, height 5\' 7"  (1.702 m), weight (!) 147.5 kg, SpO2 95%.        Intake/Output Summary (Last 24 hours) at 08/26/2023 0956 Last data filed at 08/26/2023 0000 Gross per 24 hour  Intake 480 ml  Output --  Net 480 ml   Filed Weights   08/24/23 1317 08/25/23 0440 08/25/23 2246  Weight: 122 kg (!) 146.8 kg (!) 147.5 kg    Examination: General: young female, obese, laying in bed on [place] in no acute distress HENT: EOM intact, mucous membranes pink and moist  Lungs: No accessory muscle use, decreased bilateral rhonchi Cardiovascular: s1/s2, distant. No murmur, rub, gallop Abdomen: soft and non-tender Extremities: No edema, no deformity Neuro: alert, oriented x4. Non focal   Labs show mild hyperkalemia 1.1, stable anemia  Resolved Hospital Problem list    Assessment & Plan:  Severe persistent asthma with acute exacerbation  appears her home meds include albuterol PRN, montelukast 10mg  daily, prednisone taper, lurasidone 40mg  daily. CXR without any acute abnormalities. No recent infectious symptoms. CTA negative.  -Decrease IV  Solu-Medrol to 40 every 8, can switch to oral in 1 day and provide a 2-week taper on discharge - cont' Budesonide, Brovana (ICS+LABA) while in house  -On discharge this can be converted to steroid/LABA inhaler of choice -options include Advair, Symbicort, Dulera  or Breo -whichever 1 is covered on her insurance.  - can continue PRN Xopenex, atrovent PRN - con't Singluair 10mg  daily  -Outpatient pulmonary office appointment has been arranged  OSA -empiric CPAP while in hospital -Outpatient sleep study   PCCM will be available as needed Can transfer to floor  Best Practice (right click and "Reselect all SmartList Selections" daily)  Below as per primary team  Diet/type:  DVT prophylaxis:  GI prophylaxis:  Lines:  Foley:   Code Status:   Last date of multidisciplinary goals of care discussion []   Labs   CBC: Recent Labs  Lab 08/24/23 1430 08/25/23 0543 08/26/23 0247  WBC 6.1 10.4 10.8*  NEUTROABS  --   --  9.6*  HGB 12.8 13.0 12.3  HCT 38.1 38.9 36.8  MCV 96.0 94.4 97.1  PLT 272 302 303    Basic Metabolic Panel: Recent Labs  Lab 08/24/23 1430 08/25/23 0543 08/26/23 0247  NA 137 135 136  K 3.6 4.2 5.1  CL 106 106 104  CO2 22 22 24   GLUCOSE 105* 131* 128*  BUN 10 8 17   CREATININE 0.61 0.59 0.69  CALCIUM 8.6* 8.9 8.8*  MG  --   --  2.1   GFR: Estimated Creatinine Clearance: 165.8 mL/min (by C-G formula based on SCr of 0.69 mg/dL). Recent Labs  Lab 08/24/23 1430 08/25/23 0543 08/26/23 0247  WBC 6.1 10.4 10.8*    Liver Function Tests: No results for input(s): "AST", "ALT", "ALKPHOS", "BILITOT", "PROT", "ALBUMIN" in the last 168 hours. No results for input(s): "LIPASE", "AMYLASE" in the last 168 hours. No results for input(s): "AMMONIA" in the last 168 hours.  ABG    Component Value Date/Time   HCO3 22.0 10/16/2017 0118   TCO2 23 10/16/2017 0118   ACIDBASEDEF 3.0 (H) 10/16/2017 0118   O2SAT 83.0 10/16/2017 0118     Coagulation Profile: No results for input(s): "INR", "PROTIME" in the last 168 hours.  Cardiac Enzymes: No results for input(s): "CKTOTAL", "CKMB", "CKMBINDEX", "TROPONINI" in the last 168 hours.  HbA1C: Hgb A1c MFr Bld  Date/Time Value Ref Range Status  09/17/2022 09:02 PM 4.7 (L) 4.8 -  5.6 % Final    Comment:    (NOTE) Pre diabetes:          5.7%-6.4%  Diabetes:              >6.4%  Glycemic control for   <7.0% adults with diabetes   03/05/2022 04:14 AM 4.8 4.8 - 5.6 % Final    Comment:    (NOTE) Pre diabetes:          5.7%-6.4%  Diabetes:              >6.4%  Glycemic control for   <7.0% adults with diabetes     CBG: No results for input(s): "GLUCAP" in the last 168 hours.  Cyril Mourning MD. Tonny Bollman. [facility] Pager : [phone]  If no response to pager , please call [phone] until 7 pm After 7:00 pm call Elink  [phone]   08/26/2023

## 2023-08-26 NOTE — Plan of Care (Signed)
  Problem: Education: Goal: Knowledge of General Education information will improve Description: Including pain rating scale, medication(s)/side effects and non-pharmacologic comfort measures Outcome: Progressing   Problem: Clinical Measurements: Goal: Ability to maintain clinical measurements within normal limits will improve Outcome: Progressing Goal: Will remain free from infection Outcome: Progressing   

## 2023-08-26 NOTE — TOC Initial Note (Addendum)
Transition of Care [facility]) - Initial/Assessment Note    Patient Details  Name: Whitney Beard MRN: [id number] Date of Birth: [birth year]  [facility] CM/SW Contact:    Lavenia Atlas, RN Phone Number: 08/26/2023, 2:57 PM  Clinical Narrative:    This RNCM spoke with patient at bedside regarding [facility] consult for DME/HH, PCP, and medication assistance. Patient request this RNCM contact her mother Dedra Skeens [phone] regarding needs. This RNCM spoke with Dedra Skeens who reports patient sees PCP: Crystal Gammon (updated EPIC) and does not need medication assistance. This RNCM offered choice for DME: nebulizer, patient's will accept any DME vendor that accepts patient's insurance. Patient's mother Dedra Skeens request housing assistance. This RNCM advised will attach social services/housing and shelter resources to AVS, patient and family will need to follow up with resources for housing assistance.   Per chart review patient has medical insurance and not eligible for [facility] program.                This RNCM spoke with Mitch with [employer] who will deliver DME: nebulizer to patient's room prior to discharge.   TOC following for needs.  -4:50pm Received call from Mitch with [employer] requesting additional details for insurance purposes. Notified MD of need for co-sign for modified LKG:[id number].  TOC will to continue follow for needs.  Expected Discharge Plan: Home/Self Care Barriers to Discharge: Continued Medical Work up   Patient Goals and CMS Choice Patient states their goals for this hospitalization and ongoing recovery are:: to feel better CMS Medicare.gov Compare Post Acute Care list provided to:: Patient Choice offered to / list presented to : Patient [place] ownership interest in [facility].provided to:: Patient    Expected Discharge Plan and Services In-house Referral: NA Discharge Planning Services: CM Consult Post Acute Care Choice: Durable Medical Equipment (DME: nebulizer) Living  arrangements for the past 2 months: Apartment                 DME Arranged: Community education officer DME Agency: NA       HH Arranged: NA HH Agency: NA        Prior Living Arrangements/Services Living arrangements for the past 2 months: Apartment Lives with:: Relatives Patient language and need for interpreter reviewed:: Yes Do you feel safe going back to the place where you live?: Yes      Need for Family Participation in Patient Care: No (Comment) Care giver support system in place?: Yes (comment) Current home services: Other (comment) (none) Criminal Activity/Legal Involvement Pertinent to Current Situation/Hospitalization: No - Comment as needed  Activities of Daily Living   ADL Screening (condition at time of admission) Does the patient have a NEW difficulty with bathing/dressing/toileting/self-feeding that is expected to last >3 days?: No Does the patient have a NEW difficulty with getting in/out of bed, walking, or climbing stairs that is expected to last >3 days?: No Does the patient have a NEW difficulty with communication that is expected to last >3 days?: No Is the patient deaf or have difficulty hearing?: Yes Does the patient have difficulty seeing, even when wearing glasses/contacts?: Yes Does the patient have difficulty concentrating, remembering, or making decisions?: Yes  Permission Sought/Granted Permission sought to share information with : Case Manager Permission granted to share information with : Yes, Verbal Permission Granted  Share Information with NAME: Case Manager           Emotional Assessment Appearance:: Appears stated age Attitude/Demeanor/Rapport: Gracious Affect (typically observed): Accepting Orientation: : Oriented to  Self, Oriented to Place, Oriented to  Time Alcohol / Substance Use: Not Applicable Psych Involvement: No (comment)  Admission diagnosis:  Severe persistent asthma with exacerbation [J45.51] Asthma exacerbation  [J45.901] Patient Active Problem List   Diagnosis Date Noted   Tachycardia, unspecified 08/25/2023   Severe persistent allergic asthma 03/03/2022   Asthma exacerbation 03/03/2022   Bipolar 1 disorder (HCC) 11/11/2020   Severe bipolar I disorder, current or most recent episode depressed (HCC) 11/11/2020   Anxiety disorder, unspecified 11/11/2020   Cannabis use disorder, mild, abuse 11/11/2020   Alcohol use 11/11/2020   Borderline personality disorder (HCC) 11/11/2020   Major depressive disorder 06/28/2020   Anxiety    Diabetes mellitus type 2 in obese 03/09/2019   Type 2 diabetes mellitus without complication, without long-term current use of insulin (HCC) 12/26/2018   OSA (obstructive sleep apnea) 12/26/2018   Class 3 severe obesity due to excess calories with serious comorbidity and body mass index (BMI) of 50.0 to 59.9 in adult (HCC) 12/26/2018   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   PCP:  Rush Farmer, NP Pharmacy:   [phone] - [place], [place] - [address] [place] [address] Phone: [phone] Fax: [phone]     Social Determinants of Health (SDOH) Social History: SDOH Screenings   Food Insecurity: No Food Insecurity (08/24/2023)  Housing: Medium Risk (08/24/2023)  Transportation Needs: No Transportation Needs (08/24/2023)  Utilities: Not At Risk (08/24/2023)  Alcohol Screen: Low Risk  (11/11/2020)  Social Connections: Unknown (04/29/2023)   Received from [facility]  Tobacco Use: High Risk (08/24/2023)   SDOH Interventions:     Readmission Risk Interventions    08/25/2023    4:01 PM  Readmission Risk Prevention Plan  Post Dischage Appt Complete  Medication Screening Complete  Transportation Screening Complete

## 2023-08-27 DIAGNOSIS — J4551 Severe persistent asthma with (acute) exacerbation: Secondary | ICD-10-CM | POA: Diagnosis not present

## 2023-08-27 DIAGNOSIS — F319 Bipolar disorder, unspecified: Secondary | ICD-10-CM | POA: Diagnosis not present

## 2023-08-27 DIAGNOSIS — F419 Anxiety disorder, unspecified: Secondary | ICD-10-CM | POA: Diagnosis not present

## 2023-08-27 LAB — CBC
HCT: 37.6 % (ref 36.0–46.0)
Hemoglobin: 12.3 g/dL (ref 12.0–15.0)
MCH: 32.2 pg (ref 26.0–34.0)
MCHC: 32.7 g/dL (ref 30.0–36.0)
MCV: 98.4 fL (ref 80.0–100.0)
Platelets: 267 10*3/uL (ref 150–400)
RBC: 3.82 MIL/uL — ABNORMAL LOW (ref 3.87–5.11)
RDW: 13 % (ref 11.5–15.5)
WBC: 10.8 10*3/uL — ABNORMAL HIGH (ref 4.0–10.5)
nRBC: 0 % (ref 0.0–0.2)

## 2023-08-27 LAB — BASIC METABOLIC PANEL
Anion gap: 9 (ref 5–15)
BUN: 19 mg/dL (ref 6–20)
CO2: 23 mmol/L (ref 22–32)
Calcium: 8.8 mg/dL — ABNORMAL LOW (ref 8.9–10.3)
Chloride: 102 mmol/L (ref 98–111)
Creatinine, Ser: 0.71 mg/dL (ref 0.44–1.00)
GFR, Estimated: >60 mL/min (ref >60)
Glucose, Bld: 122 mg/dL — ABNORMAL HIGH (ref 70–99)
Potassium: 4.1 mmol/L (ref 3.5–5.1)
Sodium: 134 mmol/L — ABNORMAL LOW (ref 135–145)

## 2023-08-27 MED ORDER — PREDNISONE 20 MG PO TABS
60.0000 mg | ORAL_TABLET | Freq: Two times a day (BID) | ORAL | Status: DC
  Administered 2023-08-27 – 2023-08-28 (×2): 60 mg via ORAL
  Filled 2023-08-27 (×2): qty 3

## 2023-08-27 NOTE — Plan of Care (Signed)
  Problem: Clinical Measurements: Goal: Respiratory complications will improve Outcome: Progressing   Problem: Coping: Goal: Level of anxiety will decrease Outcome: Progressing   Problem: Activity: Goal: Risk for activity intolerance will decrease Outcome: Adequate for Discharge   Problem: Nutrition: Goal: Adequate nutrition will be maintained Outcome: Adequate for Discharge

## 2023-08-27 NOTE — Progress Notes (Addendum)
PROGRESS NOTE    Whitney Beard  ONG:[id number] DOB: 16-Feb-2001 DOA: 08/24/2023 PCP: Rush Farmer, NP   Chief Complaint  Patient presents with   Shortness of Breath    Brief Narrative:  Patient is a pleasant 22 year old female history of severe persistent asthma, morbid obesity presented to the ED and admitted for acute asthma exacerbation.  Patient was placed on Dulera, scheduled nebs, IV steroids.  Overnight on 08/24/2023 patient noted to have increasing respiratory rate, use of accessory muscles of respiration, ongoing diffuse wheezing despite nebulizer treatment and subsequently transferred to the stepdown unit for closer monitoring.   Assessment & Plan:   Active Problems:   Class 3 severe obesity due to excess calories with serious comorbidity and body mass index (BMI) of 50.0 to 59.9 in adult [facility])   Bipolar 1 disorder (HCC)   Anxiety   Major depressive disorder   Asthma exacerbation   Tachycardia, unspecified  #1 acute asthma exacerbation -Patient with a 1 day history of worsening shortness of breath cough, diffuse wheezing, no evidence of infection noted. -Chest x-ray done negative for any acute abnormalities. -CT angiogram chest done negative for PE, small amount of pneumomediastinum of uncertain etiology noted. -Due to worsening respiratory status with increased wheezing, increased respiratory rate and increased work of breathing the early hours of 08/25/2023, patient transferred to the stepdown unit for closer monitoring. -Patient improving clinically.  -Patient with sats of 98% on room air and noted on ambulatory sats of 95 to 100% on room air with ambulation. -Continue Brovana, Pulmicort, scheduled DuoNebs, Claritin, PPI, Flonase, Tessalon Perles, Hycodan as needed. -Transition from IV Solu-Medrol to oral prednisone taper over 2 weeks per pulmonary recommendations.  -CPAP nightly. -Patient seen in consultation by pulmonary who are recommending continuation of current  treatment plan and on discharge patient may be converted to a steroid/LABA inhaler of choice with options including Advair, Symbicort, Dulera or Virgel Bouquet which ever is covered by her insurance.  Pulmonary also recommending resumption of montelukast and albuterol MDI use for rescue on discharge. -Outpatient follow-up with pulmonary already scheduled.  2.  Anxiety -Continue Ativan as needed.   3.  Bipolar disorder/MDD -Stable.   -Continue Zoloft, Latuda.   4.  Tachycardia chest tightness -Likely secondary to problem #1. -CT angiogram chest negative for PE or dissection. -EKG with no ischemic changes noted. -Improved.  -Nebs initially changed from albuterol to Xopenex nebs however details have been changed back to DuoNebs.  5.  Morbid obesity -BMI 43.1 -Lifestyle modification -Outpatient follow-up with PCP.  6.  Probable OSA -Outpatient follow-up with pulmonary for outpatient testing.   DVT prophylaxis: Lovenox Code Status: Full Family Communication: Updated patient.  Updated mother on FaceTime on patient's phone.  Disposition: Transfer to telemetry.  Likely home in the next 24 hours if continued improvement.    Status is: Inpatient Remains inpatient appropriate because: Severity of illness   Consultants:  Pulmonary: Dr. Vassie Loll 08/25/2023  Procedures:  CT angiogram chest 08/24/2023 Chest x-ray 08/24/2023  Antimicrobials: Anti-infectives (From admission, onward)    None         Subjective: Lying in bed on the telephone with her mother.  Cousin at bedside.  Denies any chest pain.  Feels shortness of breath has improved significantly feeling since admission.  Denies any wheezing.  Overall feeling much better.  Tolerating oral intake.   Objective: Vitals:   08/27/23 0033 08/27/23 0159 08/27/23 0356 08/27/23 0736  BP: 117/69  (!) 147/89   Pulse: (!) 58  (!) 58  Resp:   20   Temp: 97.9 F (36.6 C)  98 F (36.7 C)   TempSrc: Oral  Oral   SpO2: 99% 99% 99% 99%  Weight:       Height:        Intake/Output Summary (Last 24 hours) at 08/27/2023 1343 Last data filed at 08/27/2023 0930 Gross per 24 hour  Intake 480 ml  Output --  Net 480 ml   Filed Weights   08/24/23 1317 08/25/23 0440 08/25/23 2246  Weight: 122 kg (!) 146.8 kg (!) 147.5 kg    Examination:  General exam: NAD Respiratory system: Minimal expiratory wheezing.  No crackles.  No significant coarse breath sounds.  No use of accessory muscles of respiration.  Speaking in full sentences.  Cardiovascular system: RRR no murmurs rubs or gallops.  No JVD.  No pitting lower extremity edema.  Gastrointestinal system: Abdomen is soft, nontender, nondistended, positive bowel sounds.  No rebound.  No guarding.  Central nervous system: Alert and oriented. No focal neurological deficits. Extremities: Symmetric 5 x 5 power. Skin: No rashes, lesions or ulcers Psychiatry: Judgement and insight appear normal. Mood & affect appropriate.     Data Reviewed: I have personally reviewed following labs and imaging studies  CBC: Recent Labs  Lab 08/24/23 1430 08/25/23 0543 08/26/23 0247 08/27/23 0517  WBC 6.1 10.4 10.8* 10.8*  NEUTROABS  --   --  9.6*  --   HGB 12.8 13.0 12.3 12.3  HCT 38.1 38.9 36.8 37.6  MCV 96.0 94.4 97.1 98.4  PLT 272 302 303 267    Basic Metabolic Panel: Recent Labs  Lab 08/24/23 1430 08/25/23 0543 08/26/23 0247 08/27/23 0517  NA 137 135 136 134*  K 3.6 4.2 5.1 4.1  CL 106 106 104 102  CO2 22 22 24 23   GLUCOSE 105* 131* 128* 122*  BUN 10 8 17 19   CREATININE 0.61 0.59 0.69 0.71  CALCIUM 8.6* 8.9 8.8* 8.8*  MG  --   --  2.1  --     GFR: Estimated Creatinine Clearance: 165.8 mL/min (by C-G formula based on SCr of 0.71 mg/dL).  Liver Function Tests: No results for input(s): "AST", "ALT", "ALKPHOS", "BILITOT", "PROT", "ALBUMIN" in the last 168 hours.  CBG: No results for input(s): "GLUCAP" in the last 168 hours.   Recent Results (from the past 240 hour(s))  Resp  panel by RT-PCR (RSV, Flu A&B, Covid) Anterior Nasal Swab     Status: None   Collection Time: 08/24/23  2:14 PM   Specimen: Anterior Nasal Swab  Result Value Ref Range Status   SARS Coronavirus 2 by RT PCR NEGATIVE NEGATIVE Final    Comment: (NOTE) SARS-CoV-2 target nucleic acids are NOT DETECTED.  The SARS-CoV-2 RNA is generally detectable in upper respiratory specimens during the acute phase of infection. The lowest concentration of SARS-CoV-2 viral copies this assay can detect is 138 copies/mL. A negative result does not preclude SARS-Cov-2 infection and should not be used as the sole basis for treatment or other patient management decisions. A negative result may occur with  improper specimen collection/handling, submission of specimen other than nasopharyngeal swab, presence of viral mutation(s) within the areas targeted by this assay, and inadequate number of viral copies(<138 copies/mL). A negative result must be combined with clinical observations, patient history, and epidemiological information. The expected result is Negative.  Fact Sheet for Patients:  BloggerCourse.com  Fact Sheet for Healthcare Providers:  SeriousBroker.it  This test is no t yet approved  or cleared by the [place] and  has been authorized for detection and/or diagnosis of SARS-CoV-2 by FDA under an Emergency Use Authorization (EUA). This EUA will remain  in effect (meaning this test can be used) for the duration of the COVID-19 declaration under Section 564(b)(1) of the Act, 21 U.S.C.section 360bbb-3(b)(1), unless the authorization is terminated  or revoked sooner.       Influenza A by PCR NEGATIVE NEGATIVE Final   Influenza B by PCR NEGATIVE NEGATIVE Final    Comment: (NOTE) The Xpert Xpress SARS-CoV-2/FLU/RSV plus assay is intended as an aid in the diagnosis of influenza from Nasopharyngeal swab specimens and should not be used as a  sole basis for treatment. Nasal washings and aspirates are unacceptable for Xpert Xpress SARS-CoV-2/FLU/RSV testing.  Fact Sheet for Patients: BloggerCourse.com  Fact Sheet for Healthcare Providers: SeriousBroker.it  This test is not yet approved or cleared by the [place] FDA and has been authorized for detection and/or diagnosis of SARS-CoV-2 by FDA under an Emergency Use Authorization (EUA). This EUA will remain in effect (meaning this test can be used) for the duration of the COVID-19 declaration under Section 564(b)(1) of the Act, 21 U.S.C. section 360bbb-3(b)(1), unless the authorization is terminated or revoked.     Resp Syncytial Virus by PCR NEGATIVE NEGATIVE Final    Comment: (NOTE) Fact Sheet for Patients: BloggerCourse.com  Fact Sheet for Healthcare Providers: SeriousBroker.it  This test is not yet approved or cleared by the [place] FDA and has been authorized for detection and/or diagnosis of SARS-CoV-2 by FDA under an Emergency Use Authorization (EUA). This EUA will remain in effect (meaning this test can be used) for the duration of the COVID-19 declaration under Section 564(b)(1) of the Act, 21 U.S.C. section 360bbb-3(b)(1), unless the authorization is terminated or revoked.  Performed at [facility], [address], [place], [place] [address]   MRSA Next Gen by PCR, Nasal     Status: None   Collection Time: 08/25/23  4:35 AM   Specimen: Nasal Mucosa; Nasal Swab  Result Value Ref Range Status   MRSA by PCR Next Gen NOT DETECTED NOT DETECTED Final    Comment: (NOTE) The GeneXpert MRSA Assay (FDA approved for NASAL specimens only), is one component of a comprehensive MRSA colonization surveillance program. It is not intended to diagnose MRSA infection nor to guide or monitor treatment for MRSA infections. Test performance is not  FDA approved in patients less than 9 years old. Performed at [facility], [address], [place], [place] [address]          Radiology Studies: No results found.      Scheduled Meds:  arformoterol  15 mcg Nebulization BID   benzonatate  200 mg Oral TID   budesonide (PULMICORT) nebulizer solution  0.5 mg Nebulization BID   Chlorhexidine Gluconate Cloth  6 each Topical Nightly   enoxaparin (LOVENOX) injection  60 mg Subcutaneous QHS   fluticasone  2 spray Each Nare Daily   ipratropium-albuterol  3 mL Nebulization Q6H   loratadine  10 mg Oral Daily   lurasidone  40 mg Oral Daily   meloxicam  15 mg Oral Daily   methylPREDNISolone (SOLU-MEDROL) injection  40 mg Intravenous Q8H   montelukast  10 mg Oral QHS   pantoprazole  40 mg Oral Q0600   sertraline  50 mg Oral Daily   Continuous Infusions:   LOS: 3 days    Time spent: 40 minutes  Ramiro Harvest, MD [facility]   To contact the attending provider between 7A-7P or the covering provider during after hours 7P-7A, please log into the web site [email or web address] and access using universal [place] password for that web site. If you do not have the password, please call the hospital operator.  08/27/2023, 1:43 PM

## 2023-08-27 NOTE — Progress Notes (Signed)
   08/27/23 2033  BiPAP/CPAP/SIPAP  BiPAP/CPAP/SIPAP Pt Type Adult  Reason BIPAP/CPAP not in use Non-compliant

## 2023-08-28 ENCOUNTER — Other Ambulatory Visit (HOSPITAL_COMMUNITY): Payer: Self-pay

## 2023-08-28 DIAGNOSIS — J4551 Severe persistent asthma with (acute) exacerbation: Secondary | ICD-10-CM | POA: Diagnosis not present

## 2023-08-28 DIAGNOSIS — F322 Major depressive disorder, single episode, severe without psychotic features: Secondary | ICD-10-CM | POA: Diagnosis not present

## 2023-08-28 DIAGNOSIS — R000 Tachycardia, unspecified: Secondary | ICD-10-CM | POA: Diagnosis not present

## 2023-08-28 LAB — BASIC METABOLIC PANEL
Anion gap: 11 (ref 5–15)
BUN: 23 mg/dL — ABNORMAL HIGH (ref 6–20)
CO2: 25 mmol/L (ref 22–32)
Calcium: 8.8 mg/dL — ABNORMAL LOW (ref 8.9–10.3)
Chloride: 101 mmol/L (ref 98–111)
Creatinine, Ser: 0.81 mg/dL (ref 0.44–1.00)
GFR, Estimated: >60 mL/min (ref >60)
Glucose, Bld: 107 mg/dL — ABNORMAL HIGH (ref 70–99)
Potassium: 4.1 mmol/L (ref 3.5–5.1)
Sodium: 137 mmol/L (ref 135–145)

## 2023-08-28 LAB — CBC
HCT: 38.9 % (ref 36.0–46.0)
Hemoglobin: 12.7 g/dL (ref 12.0–15.0)
MCH: 32.1 pg (ref 26.0–34.0)
MCHC: 32.6 g/dL (ref 30.0–36.0)
MCV: 98.2 fL (ref 80.0–100.0)
Platelets: 265 10*3/uL (ref 150–400)
RBC: 3.96 MIL/uL (ref 3.87–5.11)
RDW: 12.8 % (ref 11.5–15.5)
WBC: 9.9 10*3/uL (ref 4.0–10.5)
nRBC: 0 % (ref 0.0–0.2)

## 2023-08-28 MED ORDER — LURASIDONE HCL 40 MG PO TABS
40.0000 mg | ORAL_TABLET | Freq: Every day | ORAL | 0 refills | Status: DC
  Filled 2023-08-28: qty 30, 30d supply, fill #0

## 2023-08-28 MED ORDER — BENZONATATE 200 MG PO CAPS
200.0000 mg | ORAL_CAPSULE | Freq: Three times a day (TID) | ORAL | 0 refills | Status: AC
  Filled 2023-08-28: qty 15, 5d supply, fill #0

## 2023-08-28 MED ORDER — PREDNISONE 20 MG PO TABS
ORAL_TABLET | ORAL | 0 refills | Status: AC
  Filled 2023-08-28: qty 24, 12d supply, fill #0

## 2023-08-28 MED ORDER — FLUTICASONE PROPIONATE 50 MCG/ACT NA SUSP
2.0000 | Freq: Every day | NASAL | 0 refills | Status: DC
  Filled 2023-08-28: qty 16, 30d supply, fill #0

## 2023-08-28 MED ORDER — SERTRALINE HCL 50 MG PO TABS
50.0000 mg | ORAL_TABLET | Freq: Every day | ORAL | 0 refills | Status: DC
  Filled 2023-08-28: qty 30, 30d supply, fill #0

## 2023-08-28 MED ORDER — BUDESONIDE-FORMOTEROL FUMARATE 160-4.5 MCG/ACT IN AERO
2.0000 | INHALATION_SPRAY | Freq: Two times a day (BID) | RESPIRATORY_TRACT | 2 refills | Status: DC
  Filled 2023-08-28: qty 10.2, 30d supply, fill #0

## 2023-08-28 MED ORDER — PANTOPRAZOLE SODIUM 40 MG PO TBEC
40.0000 mg | DELAYED_RELEASE_TABLET | Freq: Every day | ORAL | 1 refills | Status: DC
  Filled 2023-08-28: qty 30, 30d supply, fill #0

## 2023-08-28 MED ORDER — ALBUTEROL SULFATE HFA 108 (90 BASE) MCG/ACT IN AERS
2.0000 | INHALATION_SPRAY | RESPIRATORY_TRACT | 1 refills | Status: AC | PRN
Start: 2023-08-28 — End: ?
  Filled 2023-08-28: qty 18, 16d supply, fill #0

## 2023-08-28 MED ORDER — ENOXAPARIN SODIUM 80 MG/0.8ML IJ SOSY: 70.0000 mg | PREFILLED_SYRINGE | Freq: Every day | INTRAMUSCULAR | Status: DC

## 2023-08-28 MED ORDER — ALBUTEROL SULFATE (2.5 MG/3ML) 0.083% IN NEBU
INHALATION_SOLUTION | RESPIRATORY_TRACT | 1 refills | Status: DC
Start: 2023-08-28 — End: 2024-06-26
  Filled 2023-08-28: qty 90, 7d supply, fill #0

## 2023-08-28 MED ORDER — MONTELUKAST SODIUM 10 MG PO TABS
10.0000 mg | ORAL_TABLET | Freq: Every day | ORAL | 1 refills | Status: DC
  Filled 2023-08-28: qty 30, 30d supply, fill #0

## 2023-08-28 MED ORDER — HYDROCODONE BIT-HOMATROP MBR 5-1.5 MG/5ML PO SOLN
5.0000 mL | Freq: Four times a day (QID) | ORAL | 0 refills | Status: DC | PRN
  Filled 2023-08-28: qty 240, 12d supply, fill #0

## 2023-08-28 NOTE — Discharge Summary (Signed)
Physician Discharge Summary  Whitney Beard WUJ:[id number] DOB: 14-Nov-2001 DOA: 08/24/2023  PCP: Rush Farmer, NP  Admit date: 08/24/2023 Discharge date: 08/28/2023  Time spent: 60 minutes  Recommendations for Outpatient Follow-up:  Follow-up with Rush Farmer, NP in 1 to 2 weeks.  On follow-up patient will need basic metabolic profile done to follow-up on electrolytes and renal function.  Patient's bipolar disorder need to be reassessed.  Patient's asthma need to be followed up upon. Follow-up with Dr. Vassie Loll, pulmonary in 2 to 3 weeks.   Discharge Diagnoses:  Principal Problem:   Asthma exacerbation Active Problems:   Class 3 severe obesity due to excess calories with serious comorbidity and body mass index (BMI) of 50.0 to 59.9 in adult [facility])   Bipolar 1 disorder (HCC)   Anxiety   Major depressive disorder   Tachycardia, unspecified   Discharge Condition: Stable and improved  Diet recommendation: Regular  Filed Weights   08/24/23 1317 08/25/23 0440 08/25/23 2246  Weight: 122 kg (!) 146.8 kg (!) 147.5 kg    History of present illness:  HPI per Dr. Katherene Ponto is a 22 y.o. female with medical history significant for severe persistent asthma as well as morbid obesity being admitted to the hospital with asthma exacerbation.  She states that this past week she has been staying with her sister, who has mold in her apartment since the floor gets wet whenever it rains.  She started having shortness of breath since last night, has been coughing all day, took some albuterol inhalers today with no relief, denies any fevers, chest pain, vomiting, has been coughing up some clear yellow sputum.   ED Course: In the emergency department, patient has been afebrile, has been persistently tachycardic, saturating well on room air.  She continues to be tachycardic despite a couple of continuous nebs, and IV Solu-Medrol.  Lab work and imaging including CBC, BMP, respiratory viral panel all  unremarkable.  Due to continued wheezing and rhonchi, hospitalist observation requested.  Hospital Course:  #1 acute asthma exacerbation -Patient with a 1 day history of worsening shortness of breath cough, diffuse wheezing, no evidence of infection noted. -Chest x-ray done negative for any acute abnormalities. -CT angiogram chest done negative for PE, small amount of pneumomediastinum of uncertain etiology noted. -Due to worsening respiratory status with increased wheezing, increased respiratory rate and increased work of breathing the early hours of 08/25/2023, patient transferred to the stepdown unit for closer monitoring. -Patient improved clinically.  -Patient with sats of 98% on room air and noted on ambulatory sats of 95 to 100% on room air with ambulation. -Patient was placed and maintained on Brovana, Pulmicort, scheduled DuoNebs, Claritin, PPI, Flonase, Tessalon Perles, Hycodan as needed. -Patient was maintained on IV Solu-Medrol and as patient improved clinically was subsequently transition to oral prednisone will be discharged home on a prednisone taper. -CPAP nightly. -Patient seen in consultation by pulmonary who recommended continuation of current treatment plan in house, and on discharge patient may be converted to a steroid/LABA inhaler of choice with options including Advair, Symbicort, Dulera or Virgel Bouquet which ever is covered by her insurance.  Pulmonary also recommending resumption of montelukast and albuterol MDI use for rescue on discharge. -Patient improved clinically will be discharged on a steroid taper, Symbicort, albuterol nebs 3 times daily x 3 to 5 days and then as needed in addition to montelukast and albuterol MDI as needed. -Outpatient follow-up with pulmonary.   2.  Anxiety -Patient maintained on Ativan as needed during  the hospitalization.   3.  Bipolar disorder/MDD -Remained stable throughout the hospitalization.   -Patient was maintained on home regimen Zoloft,  Latuda.    4.  Tachycardia chest tightness -Likely secondary to problem #1. -CT angiogram chest negative for PE or dissection. -EKG with no ischemic changes noted. -Improved with treatment of problem #1 during the hospitalization and tachycardia had resolved by day of discharge.   -Outpatient follow-up with PCP.   5.  Morbid obesity -BMI 43.1 -Lifestyle modification -Outpatient follow-up with PCP.   6.  Probable OSA -Outpatient follow-up with pulmonary for outpatient testing.  Procedures: CT angiogram chest 08/24/2023 Chest x-ray 08/24/2023    Consultations: Pulmonary: Dr. Vassie Loll 08/25/2023    Discharge Exam: Vitals:   08/27/23 2029 08/28/23 0745  BP:    Pulse:    Resp:    Temp:    SpO2: 98% 96%    General: NAD Cardiovascular: Regular rate and rhythm no murmurs rubs or gallops.  No JVD.  No lower extremity edema. Respiratory: Minimal expiratory wheezing.  No crackles, no rhonchi.  Fair air movement.  No use of accessory muscles of respiration.  Speaking in full sentences.  Discharge Instructions   Discharge Instructions     Diet general   Complete by: As directed    Diet general   Complete by: As directed    Increase activity slowly   Complete by: As directed    Increase activity slowly   Complete by: As directed       Allergies as of 08/28/2023       Reactions   Other Shortness Of Breath   Animals such as cats and dogs and pollen - sneezing, asthma trigger         Medication List     TAKE these medications    acetaminophen 500 MG tablet Commonly known as: TYLENOL Take 1 tablet (500 mg total) by mouth every 6 (six) hours as needed for headache.   benzonatate 200 MG capsule Commonly known as: TESSALON Take 1 capsule (200 mg total) by mouth 3 (three) times daily for 5 days.   fluticasone 50 MCG/ACT nasal spray Commonly known as: FLONASE Place 2 sprays into both nostrils daily for 7 days. Start taking on: August 29, 2023   HYDROcodone  bit-homatropine 5-1.5 MG/5ML syrup Commonly known as: HYCODAN Take 5 mLs by mouth every 6 (six) hours as needed for cough.   ibuprofen 600 MG tablet Commonly known as: ADVIL Take 1 tablet (600 mg total) by mouth every 6 (six) hours as needed.   lurasidone 40 MG Tabs tablet Commonly known as: Latuda Take 1 tablet (40 mg total) by mouth daily.   meloxicam 15 MG tablet Commonly known as: MOBIC Take 15 mg by mouth daily.   montelukast 10 MG tablet Commonly known as: SINGULAIR Take 1 tablet (10 mg total) by mouth at bedtime.   pantoprazole 40 MG tablet Commonly known as: PROTONIX Take 1 tablet (40 mg total) by mouth daily at 6 (six) AM. Start taking on: August 29, 2023   predniSONE 20 MG tablet Commonly known as: DELTASONE Take 3 tablets (60 mg total) by mouth daily with breakfast for 4 days, THEN 2 tablets (40 mg total) daily with breakfast for 4 days, THEN 1 tablet (20 mg total) daily with breakfast for 4 days. Start taking on: August 28, 2023 What changed:  medication strength See the new instructions. Another medication with the same name was removed. Continue taking this medication, and follow the directions you  see here.   sertraline 50 MG tablet Commonly known as: ZOLOFT Take 1 tablet (50 mg total) by mouth daily.   Symbicort 160-4.5 MCG/ACT inhaler Generic drug: budesonide-formoterol Inhale 2 puffs into the lungs 2 (two) times daily.   Ventolin HFA 108 (90 Base) MCG/ACT inhaler Generic drug: albuterol Inhale 2 puffs into the lungs every 4 (four) hours as needed for wheezing or shortness of breath. What changed: Another medication with the same name was changed. Make sure you understand how and when to take each.   albuterol (2.5 MG/3ML) 0.083% nebulizer solution Commonly known as: PROVENTIL Inhale 1 vial via nebulizer 3 (three) times daily for 5 days, THEN 1 vial every 4 (four) hours as needed for wheezing or shortness of breath. Start taking on: August 28, 2023 What changed: See the new instructions.               Durable Medical Equipment  (From admission, onward)           Start     Ordered   08/26/23 1658  For home use only DME Nebulizer machine  Once       Comments: Nebulizer supplies to include: nebulizer kit and nebulizer filters  Question Answer Comment  Patient needs a nebulizer to treat with the following condition Asthma   Length of Need Lifetime      08/26/23 1659           Allergies  Allergen Reactions   Other Shortness Of Breath    Animals such as cats and dogs and pollen - sneezing, asthma trigger     Follow-up Information     Gammon, Chrystal, NP. Schedule an appointment as soon as possible for a visit in 1 week(s).   Specialty: Nurse Practitioner Why: f/u in 1-2 weeks. Contact information: [address] [place] [address] [phone]         Oretha Milch, MD Follow up in 3 week(s).   Specialty: Pulmonary Disease Why: Follow-up in 2 to 3 weeks.  Office will call with appointment time. Contact information: [address] [place] [address] [phone]                  The results of significant diagnostics from this hospitalization (including imaging, microbiology, ancillary and laboratory) are listed below for reference.    Significant Diagnostic Studies: CT Angio Chest Pulmonary Embolism (PE) W or WO Contrast  Result Date: 08/24/2023 CLINICAL DATA:  Shortness of breath.  Asthma. EXAM: CT ANGIOGRAPHY CHEST WITH CONTRAST TECHNIQUE: Multidetector CT imaging of the chest was performed using the standard protocol during bolus administration of intravenous contrast. Multiplanar CT image reconstructions and MIPs were obtained to evaluate the vascular anatomy. RADIATION DOSE REDUCTION: This exam was performed according to the departmental dose-optimization program which includes automated exposure control, adjustment of the mA and/or kV according to patient size  and/or use of iterative reconstruction technique. CONTRAST:  OMNIPAQUE IOHEXOL 350 MG/ML SOLN COMPARISON:  CT PE protocol 11/12/2019 FINDINGS: Cardiovascular: Satisfactory opacification of the pulmonary arteries to the segmental level. No evidence of pulmonary embolism. Normal heart size. No pericardial effusion. Mediastinum/Nodes: There is a small amount of pneumomediastinum diffusely. Esophagus and visualized thyroid gland are within normal limits. No enlarged lymph nodes are identified. No fluid collection or stranding identified. Lungs/Pleura: Lungs are clear. No pleural effusion or pneumothorax. Upper Abdomen: No acute abnormality. Musculoskeletal: No chest wall abnormality. No acute or significant osseous findings. Review of the MIP  images confirms the above findings. IMPRESSION: 1. No evidence for pulmonary embolism. 2. Small amount of pneumomediastinum of uncertain etiology. Electronically Signed   By: Darliss Cheney M.D.   On: 08/24/2023 22:40   DG Chest 2 View  Result Date: 08/24/2023 CLINICAL DATA:  Shortness of breath EXAM: CHEST - 2 VIEW COMPARISON:  Chest x-ray 12/23/2022 FINDINGS: The heart size and mediastinal contours are within normal limits. Both lungs are clear. The visualized skeletal structures are unremarkable. IMPRESSION: No active cardiopulmonary disease. Electronically Signed   By: Darliss Cheney M.D.   On: 08/24/2023 16:04    Microbiology: Recent Results (from the past 240 hour(s))  Resp panel by RT-PCR (RSV, Flu A&B, Covid) Anterior Nasal Swab     Status: None   Collection Time: 08/24/23  2:14 PM   Specimen: Anterior Nasal Swab  Result Value Ref Range Status   SARS Coronavirus 2 by RT PCR NEGATIVE NEGATIVE Final    Comment: (NOTE) SARS-CoV-2 target nucleic acids are NOT DETECTED.  The SARS-CoV-2 RNA is generally detectable in upper respiratory specimens during the acute phase of infection. The lowest concentration of SARS-CoV-2 viral copies this assay can detect  is 138 copies/mL. A negative result does not preclude SARS-Cov-2 infection and should not be used as the sole basis for treatment or other patient management decisions. A negative result may occur with  improper specimen collection/handling, submission of specimen other than nasopharyngeal swab, presence of viral mutation(s) within the areas targeted by this assay, and inadequate number of viral copies(<138 copies/mL). A negative result must be combined with clinical observations, patient history, and epidemiological information. The expected result is Negative.  Fact Sheet for Patients:  BloggerCourse.com  Fact Sheet for Healthcare Providers:  SeriousBroker.it  This test is no t yet approved or cleared by the [place] FDA and  has been authorized for detection and/or diagnosis of SARS-CoV-2 by FDA under an Emergency Use Authorization (EUA). This EUA will remain  in effect (meaning this test can be used) for the duration of the COVID-19 declaration under Section 564(b)(1) of the Act, 21 U.S.C.section 360bbb-3(b)(1), unless the authorization is terminated  or revoked sooner.       Influenza A by PCR NEGATIVE NEGATIVE Final   Influenza B by PCR NEGATIVE NEGATIVE Final    Comment: (NOTE) The Xpert Xpress SARS-CoV-2/FLU/RSV plus assay is intended as an aid in the diagnosis of influenza from Nasopharyngeal swab specimens and should not be used as a sole basis for treatment. Nasal washings and aspirates are unacceptable for Xpert Xpress SARS-CoV-2/FLU/RSV testing.  Fact Sheet for Patients: BloggerCourse.com  Fact Sheet for Healthcare Providers: SeriousBroker.it  This test is not yet approved or cleared by the [place] FDA and has been authorized for detection and/or diagnosis of SARS-CoV-2 by FDA under an Emergency Use Authorization (EUA). This EUA will remain in effect  (meaning this test can be used) for the duration of the COVID-19 declaration under Section 564(b)(1) of the Act, 21 U.S.C. section 360bbb-3(b)(1), unless the authorization is terminated or revoked.     Resp Syncytial Virus by PCR NEGATIVE NEGATIVE Final    Comment: (NOTE) Fact Sheet for Patients: BloggerCourse.com  Fact Sheet for Healthcare Providers: SeriousBroker.it  This test is not yet approved or cleared by the [place] FDA and has been authorized for detection and/or diagnosis of SARS-CoV-2 by FDA under an Emergency Use Authorization (EUA). This EUA will remain in effect (meaning this test can be used) for the duration of the COVID-19 declaration  under Section 564(b)(1) of the Act, 21 U.S.C. section 360bbb-3(b)(1), unless the authorization is terminated or revoked.  Performed at [facility], [address] [place], [place] [address]   MRSA Next Gen by PCR, Nasal     Status: None   Collection Time: 08/25/23  4:35 AM   Specimen: Nasal Mucosa; Nasal Swab  Result Value Ref Range Status   MRSA by PCR Next Gen NOT DETECTED NOT DETECTED Final    Comment: (NOTE) The GeneXpert MRSA Assay (FDA approved for NASAL specimens only), is one component of a comprehensive MRSA colonization surveillance program. It is not intended to diagnose MRSA infection nor to guide or monitor treatment for MRSA infections. Test performance is not FDA approved in patients less than 29 years old. Performed at [facility], [address] [place], [place] [address]      Labs: Basic Metabolic Panel: Recent Labs  Lab 08/24/23 1430 08/25/23 0543 08/26/23 0247 08/27/23 0517 08/28/23 0538  NA 137 135 136 134* 137  K 3.6 4.2 5.1 4.1 4.1  CL 106 106 104 102 101  CO2 22 22 24 23 25   GLUCOSE 105* 131* 128* 122* 107*  BUN 10 8 17 19  23*  CREATININE 0.61 0.59 0.69 0.71 0.81  CALCIUM 8.6* 8.9 8.8* 8.8*  8.8*  MG  --   --  2.1  --   --    Liver Function Tests: No results for input(s): "AST", "ALT", "ALKPHOS", "BILITOT", "PROT", "ALBUMIN" in the last 168 hours. No results for input(s): "LIPASE", "AMYLASE" in the last 168 hours. No results for input(s): "AMMONIA" in the last 168 hours. CBC: Recent Labs  Lab 08/24/23 1430 08/25/23 0543 08/26/23 0247 08/27/23 0517 08/28/23 0538  WBC 6.1 10.4 10.8* 10.8* 9.9  NEUTROABS  --   --  9.6*  --   --   HGB 12.8 13.0 12.3 12.3 12.7  HCT 38.1 38.9 36.8 37.6 38.9  MCV 96.0 94.4 97.1 98.4 98.2  PLT 272 302 303 267 265   Cardiac Enzymes: No results for input(s): "CKTOTAL", "CKMB", "CKMBINDEX", "TROPONINI" in the last 168 hours. BNP: BNP (last 3 results) No results for input(s): "BNP" in the last 8760 hours.  ProBNP (last 3 results) No results for input(s): "PROBNP" in the last 8760 hours.  CBG: No results for input(s): "GLUCAP" in the last 168 hours.     Signed:  Ramiro Harvest MD.  [facility] 08/28/2023, 7:09 PM

## 2023-08-28 NOTE — Plan of Care (Signed)

## 2023-08-28 NOTE — TOC Transition Note (Signed)
Transition of Care [facility]) - CM/SW Discharge Note   Patient Details  Name: Whitney Beard MRN: [id number] Date of Birth: 15-Oct-2001  [facility] CM/SW Contact:  Adrian Prows, RN Phone Number: 08/28/2023, 12:43 PM   Clinical Narrative:    D/C orders received; spoke w/ pt in room; she says nebulizer has been delivered; no TOC needs.   Final next level of care: Home/Self Care Barriers to Discharge: No Barriers Identified   Patient Goals and CMS Choice CMS Medicare.gov Compare Post Acute Care list provided to:: Patient Choice offered to / list presented to : Patient  Discharge Placement                         Discharge Plan and Services Additional resources added to the After Visit Summary for   In-house Referral: NA Discharge Planning Services: CM Consult Post Acute Care Choice: Durable Medical Equipment (DME: nebulizer)          DME Arranged: Nebulizer machine DME Agency: NA       HH Arranged: NA HH Agency: NA        Social Determinants of Health (SDOH) Interventions SDOH Screenings   Food Insecurity: No Food Insecurity (08/24/2023)  Housing: Medium Risk (08/24/2023)  Transportation Needs: No Transportation Needs (08/24/2023)  Utilities: Not At Risk (08/24/2023)  Alcohol Screen: Low Risk  (11/11/2020)  Social Connections: Unknown (04/29/2023)   Received from [facility]  Tobacco Use: High Risk (08/24/2023)     Readmission Risk Interventions    08/25/2023    4:01 PM  Readmission Risk Prevention Plan  Post Dischage Appt Complete  Medication Screening Complete  Transportation Screening Complete

## 2023-08-28 NOTE — Plan of Care (Signed)
  Problem: Clinical Measurements: Goal: Respiratory complications will improve Outcome: Progressing   Problem: Activity: Goal: Risk for activity intolerance will decrease Outcome: Progressing   Problem: Coping: Goal: Level of anxiety will decrease Outcome: Progressing   

## 2023-09-02 NOTE — Telephone Encounter (Signed)
Rang three times and call rejected

## 2023-09-03 NOTE — Telephone Encounter (Signed)
Tried again, same. Will try again and close encounter. Called mom (emer contact) to tell her to call for appt. NFN

## 2024-04-12 ENCOUNTER — Ambulatory Visit (HOSPITAL_BASED_OUTPATIENT_CLINIC_OR_DEPARTMENT_OTHER): Payer: MEDICAID

## 2024-04-12 ENCOUNTER — Encounter (HOSPITAL_BASED_OUTPATIENT_CLINIC_OR_DEPARTMENT_OTHER): Payer: Self-pay

## 2024-04-12 NOTE — Progress Notes (Signed)
   NURSE VISIT- PREGNANCY CONFIRMATION   SUBJECTIVE:  Whitney Beard is a 23 y.o. No obstetric history on file. female at Unknown by certain LMP of No LMP recorded. Here for pregnancy confirmation.  Home pregnancy test: positive x    She reports nausea.  She is not taking prenatal vitamins.    OBJECTIVE:  There were no vitals taken for this visit.  Appears well, in no apparent distress  No results found for this or any previous visit (from the past 24 hours).  ASSESSMENT: Positive pregnancy test, Unknown by LMP    PLAN: Prenatal vitamins: plans to begin OTC ASAP   Nausea medicines: not currently needed

## 2024-04-17 ENCOUNTER — Other Ambulatory Visit (HOSPITAL_BASED_OUTPATIENT_CLINIC_OR_DEPARTMENT_OTHER): Payer: Self-pay | Admitting: *Deleted

## 2024-04-17 DIAGNOSIS — O480 Post-term pregnancy: Secondary | ICD-10-CM

## 2024-04-17 NOTE — Progress Notes (Signed)
 Pt provided with appt for ultrasound to determine gestational age. Pt with probable LMP of 02/25/24.

## 2024-04-19 ENCOUNTER — Ambulatory Visit (INDEPENDENT_AMBULATORY_CARE_PROVIDER_SITE_OTHER): Payer: MEDICAID

## 2024-04-19 ENCOUNTER — Encounter (HOSPITAL_BASED_OUTPATIENT_CLINIC_OR_DEPARTMENT_OTHER): Payer: Self-pay

## 2024-04-19 DIAGNOSIS — O480 Post-term pregnancy: Secondary | ICD-10-CM

## 2024-04-19 DIAGNOSIS — Z3A01 Less than 8 weeks gestation of pregnancy: Secondary | ICD-10-CM | POA: Diagnosis not present

## 2024-04-19 MED ORDER — PROMETHAZINE HCL 25 MG PO TABS: 25.0000 mg | ORAL_TABLET | Freq: Four times a day (QID) | ORAL | 0 refills | Status: DC | PRN

## 2024-04-25 ENCOUNTER — Other Ambulatory Visit (HOSPITAL_BASED_OUTPATIENT_CLINIC_OR_DEPARTMENT_OTHER): Payer: Self-pay | Admitting: Certified Nurse Midwife

## 2024-04-26 ENCOUNTER — Other Ambulatory Visit (HOSPITAL_BASED_OUTPATIENT_CLINIC_OR_DEPARTMENT_OTHER): Payer: Self-pay | Admitting: Certified Nurse Midwife

## 2024-05-02 ENCOUNTER — Other Ambulatory Visit: Payer: Self-pay

## 2024-05-02 ENCOUNTER — Emergency Department (HOSPITAL_BASED_OUTPATIENT_CLINIC_OR_DEPARTMENT_OTHER): Payer: MEDICAID | Admitting: Radiology

## 2024-05-02 ENCOUNTER — Encounter (HOSPITAL_BASED_OUTPATIENT_CLINIC_OR_DEPARTMENT_OTHER): Payer: Self-pay

## 2024-05-02 ENCOUNTER — Emergency Department (HOSPITAL_BASED_OUTPATIENT_CLINIC_OR_DEPARTMENT_OTHER)
Admission: EM | Admit: 2024-05-02 | Discharge: 2024-05-02 | Disposition: A | Discharge status: Home / Self Care | Payer: MEDICAID | Attending: Emergency Medicine | Admitting: Emergency Medicine

## 2024-05-02 DIAGNOSIS — E119 Type 2 diabetes mellitus without complications: Secondary | ICD-10-CM | POA: Insufficient documentation

## 2024-05-02 DIAGNOSIS — J4541 Moderate persistent asthma with (acute) exacerbation: Secondary | ICD-10-CM | POA: Diagnosis not present

## 2024-05-02 DIAGNOSIS — O26891 Other specified pregnancy related conditions, first trimester: Secondary | ICD-10-CM | POA: Diagnosis present

## 2024-05-02 DIAGNOSIS — Z3A01 Less than 8 weeks gestation of pregnancy: Secondary | ICD-10-CM | POA: Diagnosis not present

## 2024-05-02 DIAGNOSIS — O99511 Diseases of the respiratory system complicating pregnancy, first trimester: Secondary | ICD-10-CM | POA: Diagnosis not present

## 2024-05-02 DIAGNOSIS — Z7951 Long term (current) use of inhaled steroids: Secondary | ICD-10-CM | POA: Insufficient documentation

## 2024-05-02 MED ORDER — ALBUTEROL SULFATE (2.5 MG/3ML) 0.083% IN NEBU
5.0000 mg | INHALATION_SOLUTION | Freq: Once | RESPIRATORY_TRACT | Status: AC
  Administered 2024-05-02: 5 mg via RESPIRATORY_TRACT
  Filled 2024-05-02: qty 6

## 2024-05-02 MED ORDER — IPRATROPIUM-ALBUTEROL 0.5-2.5 (3) MG/3ML IN SOLN
3.0000 mL | Freq: Once | RESPIRATORY_TRACT | Status: AC
  Administered 2024-05-02: 3 mL via RESPIRATORY_TRACT
  Filled 2024-05-02: qty 3

## 2024-05-02 MED ORDER — ALBUTEROL SULFATE (2.5 MG/3ML) 0.083% IN NEBU
2.5000 mg | INHALATION_SOLUTION | Freq: Once | RESPIRATORY_TRACT | Status: AC
  Administered 2024-05-02: 2.5 mg via RESPIRATORY_TRACT
  Filled 2024-05-02: qty 3

## 2024-05-02 NOTE — ED Triage Notes (Signed)
 Pt c/o asthma acting up x2 days. C/o increased wheezing, shob, "home meds not working as well." Advises home tx in the middle of the night, minimal relief- no relief w inhaler, singulair  today

## 2024-05-02 NOTE — ED Notes (Signed)
 Pt alert and oriented X 4 at the time of discharge. RR even and unlabored. No acute distress noted. Pt verbalized understanding of discharge instructions as discussed. Pt ambulatory to lobby at time of discharge.

## 2024-05-02 NOTE — ED Provider Notes (Signed)
 [place] [facility] AT [facility] Provider Note   CSN: [id number] Arrival date & time: 05/02/24  1437     History  No chief complaint on file.   Whitney Beard is a 23 y.o. female.  23 year old female presents with complaint of asthma exacerbation onset 2 days ago. Using home albuterol  nebulizer and inhaler without improvement.  Also taking her Singulair .  Previously admitted for asthma exacerbation back in September 2024, discharged on medications including Symbicort  but states that she has run out of this and does not have a refill.  Patient is newly pregnant at 6 to 7 weeks.  Has seen OB with ultrasound confirming IUP.  Denies fevers, chills, recent illness or lower extremity edema.  No prior intubations.  Currently on nebulizer treatment with symptoms improving.       Home Medications Prior to Admission medications   Medication Sig Start Date End Date Taking? Authorizing Provider  ondansetron  (ZOFRAN ) 4 MG tablet Take 4 mg by mouth 3 (three) times daily as needed. 04/23/24  Yes [provider]  acetaminophen  (TYLENOL ) 500 MG tablet Take 1 tablet (500 mg total) by mouth every 6 (six) hours as needed for headache. 12/23/22   Kraig Peru, MD  albuterol  (PROVENTIL ) (2.5 MG/3ML) 0.083% nebulizer solution Inhale 1 vial via nebulizer 3 (three) times daily for 5 days, THEN 1 vial every 4 (four) hours as needed for wheezing or shortness of breath. 08/28/23 09/01/24  Armenta Landau, MD  albuterol  (VENTOLIN  HFA) [phone] Base) MCG/ACT inhaler Inhale 2 puffs into the lungs every 4 (four) hours as needed for wheezing or shortness of breath. 08/28/23   Armenta Landau, MD  budesonide -formoterol  (SYMBICORT ) 160-4.5 MCG/ACT inhaler Inhale 2 puffs into the lungs 2 (two) times daily. Patient not taking: Reported on 04/19/2024 08/28/23   Armenta Landau, MD  fluticasone  (FLONASE ) 50 MCG/ACT nasal spray Place 2 sprays into both nostrils daily for 7 days. 08/29/23 09/27/23   Armenta Landau, MD  lurasidone  (LATUDA ) 40 MG TABS tablet Take 1 tablet (40 mg total) by mouth daily. Patient not taking: Reported on 04/19/2024 08/28/23   Armenta Landau, MD  montelukast  (SINGULAIR ) 10 MG tablet Take 1 tablet (10 mg total) by mouth at bedtime. Patient not taking: Reported on 04/19/2024 08/28/23   Armenta Landau, MD  pantoprazole  (PROTONIX ) 40 MG tablet Take 1 tablet (40 mg total) by mouth daily at 6 (six) AM. Patient not taking: Reported on 04/19/2024 08/29/23   Armenta Landau, MD  promethazine  (PHENERGAN ) 25 MG tablet Take 1 tablet (25 mg total) by mouth every 6 (six) hours as needed. 04/19/24   Lo, Juvenal Opoka, CNM  sertraline  (ZOLOFT ) 50 MG tablet Take 1 tablet (50 mg total) by mouth daily. Patient not taking: Reported on 04/19/2024 08/28/23   Armenta Landau, MD      Allergies    Other    Review of Systems   Review of Systems Negative except as per HPI Physical Exam Updated Vital Signs BP 130/89 (BP Location: Right Arm)   Pulse 90   Temp 98.1 F (36.7 C) (Oral)   Resp 18   LMP 02/25/2024 (Approximate)   SpO2 98%  Physical Exam Vitals and nursing note reviewed.  Constitutional:      General: She is not in acute distress.    Appearance: She is well-developed. She is obese. She is not diaphoretic.  HENT:     Head: Normocephalic and atraumatic.  Cardiovascular:  Rate and Rhythm: Normal rate and regular rhythm.     Heart sounds: Normal heart sounds.  Pulmonary:     Effort: Pulmonary effort is normal.     Breath sounds: Wheezing present.  Musculoskeletal:     Right lower leg: No edema.     Left lower leg: No edema.  Skin:    General: Skin is warm and dry.     Findings: No erythema or rash.  Neurological:     Mental Status: She is alert and oriented to person, place, and time.  Psychiatric:        Behavior: Behavior normal.     ED Results / Procedures / Treatments   Labs (all labs ordered are listed, but only abnormal results are  displayed) Labs Reviewed - No data to display  EKG None  Radiology DG Chest 2 View Result Date: 05/02/2024 CLINICAL DATA:  Shortness of breath. EXAM: CHEST - 2 VIEW COMPARISON:  08/24/2023 FINDINGS: The heart size and mediastinal contours are within normal limits. Central peribronchial cuffing, which can be seen in the setting of asthma or bronchitis. No focal consolidation, pleural effusion, or pneumothorax. The visualized osseous structures are unremarkable. IMPRESSION: Central peribronchial cuffing, which can be seen in the setting of asthma or bronchitis. No focal consolidation. Electronically Signed   By: Mannie Seek M.D.   On: 05/02/2024 16:48    Procedures Procedures    Medications Ordered in ED Medications  albuterol  (PROVENTIL ) (2.5 MG/3ML) 0.083% nebulizer solution 2.5 mg (2.5 mg Nebulization Given 05/02/24 1511)  ipratropium-albuterol  (DUONEB) 0.5-2.5 (3) MG/3ML nebulizer solution 3 mL (3 mLs Nebulization Given 05/02/24 1511)  albuterol  (PROVENTIL ) (2.5 MG/3ML) 0.083% nebulizer solution 5 mg (5 mg Nebulization Given 05/02/24 1525)  albuterol  (PROVENTIL ) (2.5 MG/3ML) 0.083% nebulizer solution 5 mg (5 mg Nebulization Given 05/02/24 1608)    ED Course/ Medical Decision Making/ A&P                                 Medical Decision Making Amount and/or Complexity of Data Reviewed Radiology: ordered.   This patient presents to the ED for concern of wheezing, this involves an extensive number of treatment options, and is a complaint that carries with it a high risk of complications and morbidity.  The differential diagnosis includes asthma exacerbation    Co morbidities / Chronic conditions that complicate the patient evaluation  Asthma, pregnant, bipolar disorder, ADHD, DM   Additional history obtained:  Additional history obtained from EMR External records from outside source obtained and reviewed including prior admission related to asthma    Imaging Studies  ordered:  I ordered imaging studies including CXR as requested by patient  I independently visualized and interpreted imaging which showed changes consistent with asthma, no PNA, no PNX I agree with the radiologist interpretation   Problem List / ED Course / Critical interventions / Medication management  23 year old female with history of asthma presents at nearly [redacted] weeks gestation with complaint of asthma exacerbation.  She states that her nebulizer is broken, she is using her inhaler and broken nebulizer without significant relief of her asthma.  Notable wheezing and decreased air movement on exam on arrival, improving with nebs.  On recheck, symptoms have resolved.  Patient provided with prescription for new nebulizer per request although advised patient that this may not be something that can be filled at her typical pharmacy or with a prescription from the ER and to  recheck with her PCP.  Follow-up with OB.  Return to ER for worsening or concerning symptoms. I ordered medication including duoneb, albuterol    Reevaluation of the patient after these medicines showed that the patient improved I have reviewed the patients home medicines and have made adjustments as needed   Social Determinants of Health:  Has PCP/OB   Test / Admission - Considered:  Symptoms resolved, ready for dc         Final Clinical Impression(s) / ED Diagnoses Final diagnoses:  Moderate persistent asthma with acute exacerbation    Rx / DC Orders ED Discharge Orders          Ordered    For home use only DME Nebulizer machine        05/02/24 1744              Darlis Eisenmenger, PA-C 05/02/24 Avon Lemon, MD 05/09/24 [phone]

## 2024-05-02 NOTE — Discharge Instructions (Signed)
 Use your medications as prescribed. Follow up with your OB and primary care.

## 2024-05-22 ENCOUNTER — Ambulatory Visit (INDEPENDENT_AMBULATORY_CARE_PROVIDER_SITE_OTHER): Payer: MEDICAID | Admitting: Certified Nurse Midwife

## 2024-05-22 ENCOUNTER — Other Ambulatory Visit (HOSPITAL_COMMUNITY)
Admission: RE | Admit: 2024-05-22 | Discharge: 2024-05-22 | Disposition: A | Discharge status: Home / Self Care | Payer: MEDICAID | Source: Ambulatory Visit | Attending: Certified Nurse Midwife | Admitting: Certified Nurse Midwife

## 2024-05-22 VITALS — BP 119/69 | HR 110 | Wt 329.4 lb

## 2024-05-22 DIAGNOSIS — Z3402 Encounter for supervision of normal first pregnancy, second trimester: Secondary | ICD-10-CM | POA: Insufficient documentation

## 2024-05-22 DIAGNOSIS — Z3401 Encounter for supervision of normal first pregnancy, first trimester: Secondary | ICD-10-CM | POA: Insufficient documentation

## 2024-05-22 DIAGNOSIS — G4730 Sleep apnea, unspecified: Secondary | ICD-10-CM

## 2024-05-22 DIAGNOSIS — Z9151 Personal history of suicidal behavior: Secondary | ICD-10-CM

## 2024-05-22 DIAGNOSIS — Z8669 Personal history of other diseases of the nervous system and sense organs: Secondary | ICD-10-CM

## 2024-05-22 DIAGNOSIS — O0990 Supervision of high risk pregnancy, unspecified, unspecified trimester: Secondary | ICD-10-CM | POA: Insufficient documentation

## 2024-05-22 DIAGNOSIS — J45909 Unspecified asthma, uncomplicated: Secondary | ICD-10-CM

## 2024-05-22 DIAGNOSIS — O99210 Obesity complicating pregnancy, unspecified trimester: Secondary | ICD-10-CM | POA: Insufficient documentation

## 2024-05-22 DIAGNOSIS — Z3A11 11 weeks gestation of pregnancy: Secondary | ICD-10-CM | POA: Insufficient documentation

## 2024-05-22 DIAGNOSIS — O99211 Obesity complicating pregnancy, first trimester: Secondary | ICD-10-CM

## 2024-05-22 DIAGNOSIS — F419 Anxiety disorder, unspecified: Secondary | ICD-10-CM

## 2024-05-22 DIAGNOSIS — O99511 Diseases of the respiratory system complicating pregnancy, first trimester: Secondary | ICD-10-CM

## 2024-05-22 MED ORDER — ASPIRIN 81 MG PO TBEC: 81.0000 mg | DELAYED_RELEASE_TABLET | Freq: Every day | ORAL | 12 refills | Status: DC

## 2024-05-22 MED ORDER — ONDANSETRON HCL 4 MG PO TABS: 4.0000 mg | ORAL_TABLET | Freq: Three times a day (TID) | ORAL | 3 refills | Status: DC | PRN

## 2024-05-22 MED ORDER — BLOOD PRESSURE KIT DEVI: 0 refills | Status: AC

## 2024-05-22 MED ORDER — ONDANSETRON HCL 4 MG PO TABS: 4.0000 mg | ORAL_TABLET | Freq: Three times a day (TID) | ORAL | 3 refills | Status: AC | PRN

## 2024-05-22 MED ORDER — BUSPIRONE HCL 10 MG PO TABS: 10.0000 mg | ORAL_TABLET | Freq: Three times a day (TID) | ORAL | 0 refills | Status: AC | PRN

## 2024-05-22 MED ORDER — PRENATAL PLUS 27-1 MG PO TABS
1.0000 | ORAL_TABLET | Freq: Every day | ORAL | 12 refills | Status: AC
Start: 2024-05-22 — End: ?

## 2024-05-22 NOTE — Progress Notes (Signed)
 New OB Intake  I explained I am completing New OB Intake today. We discussed EDD of 12/08/2024, by Ultrasound. Pt is G1P0. I reviewed her allergies, medications and Medical/Surgical/OB history.    Patient Active Problem List   Diagnosis Date Noted   Encounter for supervision of normal first pregnancy in first trimester 05/22/2024   Maternal morbid obesity, antepartum (HCC) 05/22/2024   [redacted] weeks gestation of pregnancy 05/22/2024   Tachycardia, unspecified 08/25/2023   Severe persistent allergic asthma 03/03/2022   Asthma exacerbation 03/03/2022   Bipolar 1 disorder (HCC) 11/11/2020   Severe bipolar I disorder, current or most recent episode depressed (HCC) 11/11/2020   Anxiety disorder, unspecified 11/11/2020   Cannabis use disorder, mild, abuse 11/11/2020   Alcohol use 11/11/2020   Borderline personality disorder (HCC) 11/11/2020   Major depressive disorder 06/28/2020   Anxiety    Type 2 diabetes mellitus with obesity (HCC) 03/09/2019   Type 2 diabetes mellitus without complication, without long-term current use of insulin  (HCC) 12/26/2018   OSA (obstructive sleep apnea) 12/26/2018   Class 3 severe obesity due to excess calories with serious comorbidity and body mass index (BMI) of 50.0 to 59.9 in adult 12/26/2018   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 Discussed the nature of our practice with multiple providers including residents and students. Due to the size of the practice, the delivering provider may not be the same as those providing prenatal care.   Patient is not interested in water birth.  MyChart/Babyscripts MyChart access verified. I explained pt will have some visits in office and some virtually. Babyscripts instructions given and order placed. Patient verifies receipt of registration text/e-mail. Account successfully created and app downloaded. If patient is a candidate for Optimized  scheduling, add to sticky note.   Blood Pressure Cuff/Weight Scale Blood pressure cuff ordered for patient to pick-up from [employer]. Explained after first prenatal appt pt will check weekly and document in Babyscripts. Patient does have weight scale.  Anatomy US  Explained first scheduled US  will be around 19 weeks. Order placed for US  07/10/24 (MFM).   Is patient a CenteringPregnancy candidate?  Declined   Is patient a Mom+Baby Combined Care candidate?  Declined   If accepted, confirm patient does not intend to move from the area for at least 12 months, then notify Mom+Baby staff  Is patient a candidate for Babyscripts Optimization? Yes, patient accepted    First visit review I reviewed new OB appt with patient.  Discussed [place] genetic screening with patient. Desires [employer] and [employer].. Routine prenatal labs is   Last Pap No prior pap smear. Discussed pap smear at next prenatal visit (IPV). Pt also desires AFP at next visit to screen for ONTDs.  Arland MARLA Roller, CNM 05/22/2024  4:37 PM   1. Encounter for supervision of normal first pregnancy in first trimester - Continue Prenatal Vitamin (Rx sent) - Start ASA 81mg  po once daily (Rx sent) - Limited transabdominal US  today confirms FHR 160bpm - ABO/Rh - Antibody screen - CBC - Hepatitis B surface antigen - HIV Antibody (routine testing w rflx) - HIV (Save tube for possible reflex) - RPR - Rubella screen - Hepatitis C antibody - Hemoglobin A1c - Culture, OB Urine - Cervicovaginal ancillary only - PANORAMA PRENATAL TEST - HORIZON Basic Panel - Blood Pressure Monitoring (BLOOD PRESSURE KIT) DEVI; Check BP daily  Dispense: 1 each; Refill: 0 - Plan pap smear at  Initial Prenatal Visit (next visit)  2. [redacted] weeks gestation of pregnancy  3. Maternal morbid obesity, antepartum (HCC) (Primary) - Most recent HgA1C 4.7 (09/17/22) - Comp Met (CMET) - Protein / creatinine ratio, urine - Thyroid  Panel With TSH  4. Asthma  during pregnancy - Pt has current unexpired inhaler - Asthma stable at this time (05/22/24) - Need to establish care with Adult Pulmonology (also has Hx Sleep Apnea and Persistent Asthma). Referral to Pulmonology placed.  5. Anxiety - Pt states prior SSRI therapy not effective (Zoloft /Lexapro /Prozac ). She is willing to try Buspar TID prn. Knows that counseling is available. - Has good support system. - Referral placed to [facility].  Arland MARLA Roller

## 2024-05-23 ENCOUNTER — Encounter (HOSPITAL_BASED_OUTPATIENT_CLINIC_OR_DEPARTMENT_OTHER): Payer: Self-pay | Admitting: Certified Nurse Midwife

## 2024-05-23 DIAGNOSIS — Z8669 Personal history of other diseases of the nervous system and sense organs: Secondary | ICD-10-CM | POA: Insufficient documentation

## 2024-05-23 DIAGNOSIS — Z9151 Personal history of suicidal behavior: Secondary | ICD-10-CM | POA: Insufficient documentation

## 2024-05-23 LAB — COMPREHENSIVE METABOLIC PANEL WITH GFR
ALT: 9 IU/L (ref 0–32)
AST: 8 IU/L (ref 0–40)
Albumin: 3.8 g/dL — ABNORMAL LOW (ref 4.0–5.0)
Alkaline Phosphatase: 73 IU/L (ref 44–121)
BUN/Creatinine Ratio: 14 (ref 9–23)
BUN: 9 mg/dL (ref 6–20)
Bilirubin Total: <0.2 mg/dL (ref 0.0–1.2)
CO2: 19 mmol/L — ABNORMAL LOW (ref 20–29)
Calcium: 8.7 mg/dL (ref 8.7–10.2)
Chloride: 102 mmol/L (ref 96–106)
Creatinine, Ser: 0.64 mg/dL (ref 0.57–1.00)
Globulin, Total: 2.6 g/dL (ref 1.5–4.5)
Glucose: 85 mg/dL (ref 70–99)
Potassium: 4 mmol/L (ref 3.5–5.2)
Sodium: 135 mmol/L (ref 134–144)
Total Protein: 6.4 g/dL (ref 6.0–8.5)
eGFR: 127 mL/min/{1.73_m2} (ref >59)

## 2024-05-23 LAB — HEMOGLOBIN A1C
Est. average glucose Bld gHb Est-mCnc: 97 mg/dL
Hgb A1c MFr Bld: 5 % (ref 4.8–5.6)

## 2024-05-23 LAB — CBC
Hematocrit: 34.2 % (ref 34.0–46.6)
Hemoglobin: 11.1 g/dL (ref 11.1–15.9)
MCH: 32 pg (ref 26.6–33.0)
MCHC: 32.5 g/dL (ref 31.5–35.7)
MCV: 99 fL — ABNORMAL HIGH (ref 79–97)
Platelets: 289 10*3/uL (ref 150–450)
RBC: 3.47 x10E6/uL — ABNORMAL LOW (ref 3.77–5.28)
RDW: 12.9 % (ref 11.7–15.4)
WBC: 7 10*3/uL (ref 3.4–10.8)

## 2024-05-23 LAB — RPR: RPR Ser Ql: NONREACTIVE

## 2024-05-23 LAB — ABO/RH: Rh Factor: POSITIVE

## 2024-05-23 LAB — RUBELLA SCREEN: Rubella Antibodies, IGG: 2.05 {index} (ref >0.99)

## 2024-05-23 LAB — PROTEIN / CREATININE RATIO, URINE
Creatinine, Urine: 118.2 mg/dL
Protein, Ur: 7.2 mg/dL
Protein/Creat Ratio: 61 mg/g{creat} (ref 0–200)

## 2024-05-23 LAB — HIV ANTIBODY (ROUTINE TESTING W REFLEX): HIV Screen 4th Generation wRfx: NONREACTIVE

## 2024-05-23 LAB — THYROID PANEL WITH TSH
Free Thyroxine Index: 1.8 (ref 1.2–4.9)
T3 Uptake Ratio: 16 % — ABNORMAL LOW (ref 24–39)
T4, Total: 11 ug/dL (ref 4.5–12.0)
TSH: 0.505 u[IU]/mL (ref 0.450–4.500)

## 2024-05-23 LAB — HEPATITIS B SURFACE ANTIGEN: Hepatitis B Surface Ag: NEGATIVE

## 2024-05-23 LAB — HEPATITIS C ANTIBODY: Hep C Virus Ab: NONREACTIVE

## 2024-05-23 LAB — CERVICOVAGINAL ANCILLARY ONLY
Chlamydia: NEGATIVE
Comment: NEGATIVE
Comment: NEGATIVE
Comment: NORMAL
Neisseria Gonorrhea: NEGATIVE
Trichomonas: NEGATIVE

## 2024-05-23 LAB — ANTIBODY SCREEN: Antibody Screen: NEGATIVE

## 2024-05-24 ENCOUNTER — Ambulatory Visit (HOSPITAL_BASED_OUTPATIENT_CLINIC_OR_DEPARTMENT_OTHER): Payer: Self-pay | Admitting: Certified Nurse Midwife

## 2024-05-24 LAB — URINE CULTURE, OB REFLEX

## 2024-05-24 LAB — CULTURE, OB URINE

## 2024-05-30 LAB — PANORAMA PRENATAL TEST FULL PANEL:PANORAMA TEST PLUS 5 ADDITIONAL MICRODELETIONS: FETAL FRACTION: 4.3

## 2024-05-30 LAB — HORIZON CUSTOM: REPORT SUMMARY: NEGATIVE

## 2024-06-09 ENCOUNTER — Telehealth (HOSPITAL_BASED_OUTPATIENT_CLINIC_OR_DEPARTMENT_OTHER): Payer: Self-pay | Admitting: Obstetrics & Gynecology

## 2024-06-09 NOTE — Telephone Encounter (Signed)
 New message   The patient called from her manager's office at work, stating that her mother advised her to contact our office. She reported feeling unwell and experiencing a headache.   The patient is currently [redacted] weeks pregnant. She was asked if she was working in [place], to which she clarified that her workplace is in [place].    I advised patient to seek evaluation at [facility] patient verbalized understanding.   Upcoming appointment 06/23/24 with Dr. Cleotilde.

## 2024-06-15 ENCOUNTER — Ambulatory Visit: Payer: MEDICAID | Admitting: Clinical

## 2024-06-15 DIAGNOSIS — F1210 Cannabis abuse, uncomplicated: Secondary | ICD-10-CM

## 2024-06-15 DIAGNOSIS — F603 Borderline personality disorder: Secondary | ICD-10-CM

## 2024-06-15 NOTE — BH Specialist Note (Unsigned)
 Integrated Behavioral Health via Telemedicine Visit  06/15/2024 [name] [id number]  Number of Integrated Behavioral Health Clinician visits: 1- Initial Visit  Session Start time: 1502   Session End time: 1612  Total time in minutes: 70    Referring Provider: Arland Roller, CNM Patient/Family location: Home [facility] Provider location: [facility]  All persons participating in visit: Patient Whitney Beard and [facility] Bliss Behnke    Types of Service: Individual psychotherapy and Video visit  I connected with Whitney Beard and/or Whitney Beard's n/a via  Telephone or Video Enabled Telemedicine Application  (Video is Caregility application) and verified that I am speaking with the correct person using two identifiers. Discussed confidentiality: Yes   I discussed the limitations of telemedicine and the availability of in person appointments.  Discussed there is a possibility of technology failure and discussed alternative modes of communication if that failure occurs.  I discussed that engaging in this telemedicine visit, they consent to the provision of behavioral healthcare and the services will be billed under their insurance.  Patient and/or legal guardian expressed understanding and consented to Telemedicine visit: Yes   Presenting Concerns: Patient and/or family reports the following symptoms/concerns: Adjusting to first pregnancy with nausea, oversleeping and low appetite; has reduced marijuana usage to once weekly (maintaining weekly to help with appetite); passive suicidal thoughts have increased in pregnancy with no intent and no plan; will begin taking Buspar  next week after picking up from pharmacy. Pt is open to referral to psychiatry and will consider ongoing therapy specializing in Borderline Personality Disorder (BPD). Pt copes best by time with friends, planning ahead and interested in prioritizing for baby's arrival as much as  possible, followed by long-term goal to go back to school for paralegal.  Duration of problem: Ongoing; Severity of problem: moderately severe  Patient and/or Family's Strengths/Protective Factors: Social connections and Sense of purpose  Goals Addressed: Patient will:  Reduce symptoms of: anxiety, depression, mood instability, and stress   Increase knowledge and/or ability of: healthy habits   Demonstrate ability to: Increase healthy adjustment to current life circumstances, Increase adequate support systems for patient/family, and Increase motivation to adhere to plan of care  Progress towards Goals: Ongoing    Interventions: Interventions utilized:  Motivational Interviewing, Link to [employer], and Supportive Reflection Standardized Assessments completed: GAD-7 and PHQ 9    Patient and/or Family Response: Patient Whitney Beard and [facility] Whitney Beard    Clinical Assessment/Diagnosis:  Borderline personality disorder (HCC)  Cannabis use disorder, mild, abuse    Pt denies bipolar disorder; says she was misdiagnosed in the past.   Patient may benefit from psychoeducation and brief therapeutic interventions regarding coping with symptoms of depression, anxiety, life stress .  Plan: Follow up with behavioral health clinician on : Two weeks Behavioral recommendations:  -Continue taking prenatal vitamin daily; begin taking Buspar  as prescribed -Continue prioritizing healthy self-care (regular meals, adequate rest; allowing practical help from supportive friends and family) daily -Accept referral to psychiatry; consider ongoing therapist (specializing in DBT to treat BPD, as discussed) -Continue plan to apply for both WIC and EBT next week -Continue to reduce marijuana usage to once weekly or less (discuss low appetite with medical provider at upcoming visit) -Read through information on After Visit Summary; use as needed and discussed (including using GCBHUC in SI  returns, as used in the past)  Referral(s): Integrated Art gallery manager (In Clinic), Community Mental Health Services (LME/Outside Clinic), and [employer]  Resources:  Paramedic services, childcare, parenting support, postpartum planner  I discussed the assessment and treatment plan with the patient and/or parent/guardian. They were provided an opportunity to ask questions and all were answered. They agreed with the plan and demonstrated an understanding of the instructions.   They were advised to call back or seek an in-person evaluation if the symptoms worsen or if the condition fails to improve as anticipated.  Warren JAYSON Mering, LCSW     05/22/2024    4:53 PM  Depression screen PHQ 2/9  Decreased Interest 0  Down, Depressed, Hopeless 1  PHQ - 2 Score 1  Altered sleeping 2  Tired, decreased energy 1  Change in appetite 1  Feeling bad or failure about yourself  0  Trouble concentrating 0  Moving slowly or fidgety/restless 0  Suicidal thoughts 0  PHQ-9 Score 5  Difficult doing work/chores Not difficult at all      05/22/2024    4:54 PM  GAD 7 : Generalized Anxiety Score  Nervous, Anxious, on Edge 2  Control/stop worrying 1  Worry too much - different things 2  Trouble relaxing 1  Restless 2  Easily annoyed or irritable 2  Afraid - awful might happen 0  Total GAD 7 Score 10  Anxiety Difficulty Not difficult at all

## 2024-06-15 NOTE — Patient Instructions (Signed)
 Center for [facility] at [facility] [address] [place], [place] [address] [phone] (main office) [phone] [facility] office) [email or web address]  [facility]  [address] [place], [place] [address] [phone] or [phone] [facility] 24/7 FOR ANYONE [address], [place], [place]  [phone] Fax: [phone] [email or web address] *Interpreters available *Accepts all insurance and uninsured for Urgent Care needs *Accepts Medicaid and uninsured for outpatient treatment (below)    ONLY FOR [facility]  Below:   Outpatient New Patient Assessment/Therapy Walk-ins:        Monday -Thursday 8am until slots are full.        Every Friday 1pm-4pm  (first come, first served)                   New Patient Psychiatry/Medication Management        Monday-Friday 8am-11am (first come, first served)              For all walk-ins we ask that you arrive by 7:15am, because patients will be seen in the order of arrival.  ------------------------------------------------------------------------------------------------------------ [employer]  (Childcare options, Early childcare development, etc.) www.guilfordchildren.org  [place]  Child Care Facility Search Engine  https://ncchildcare.http://cook.com/ ----------------------------------------------------------------------------------------------------------          Parenting Resources:   Zero to Three www.zerotothree.[employer] for [employer] www.bootcampfornewdads.org   [employer]le [employer] http://knight-[email or web address].biz/   Happiest Baby on the Block www.happiestbaby.com  [employer].[email or web address]          Options for Doula Care in the [place] Area:   As you review your birthing options, consider having a birth doula. A doula is trained to provide support before, during and just after you  give birth. There are also postpartum doulas that help you adjust to new parenthood.  While doulas do not provide medical care, they do provide emotional, physical and educational support. A few months before your baby arrives, doulas can help answer questions, ease concerns and help you create and support your birthing plan.    Doulas can help reduce your stress and comfort you and your partner. They can help you cope with labor by helping you use breathing techniques, massage, creative labor positioning, essential oils and affirmations.   Studies show that the benefits of having a doula include:   A more positive birth experience  Fewer requests for pain-relief medication  Less likelihood of cesarean section, commonly called a c-section   Doulas are typically hired via a Advertising account planner between you and the doula. We are happy to provide a list of the most active doulas in the area, all of whom are credentialed by [facility] and will not count as a visitor at your birth.  There are several options for no-cost doula care at our hospital, including:  [facility] Volunteer Doula Program Every [employer] Program A Cure 4 Moms Doula Study (available only at [employer] for Women, [place], [place] and [employer] [facility] offices)  For more information on these programs or to receive a list of doulas active in our area, please email [email or web address]     BRAINSTORMING  Develop a Plan Goals: Provide a way to start conversation about your new life with a baby Assist parents in recognizing and using resources within their reach Help pave the way before birth for an easier period of transition afterwards.  Make a list of the following information to keep in a central location: Full name of Mom and  Partner: _____________________________________________ Baby's full name and Date of Birth: ___________________________________________ Home Address:  ___________________________________________________________ ________________________________________________________________________ Home Phone: ____________________________________________________________ Parents' cell numbers: _____________________________________________________ ________________________________________________________________________ Name and contact info for OB: ______________________________________________ Name and contact info for Pediatrician:________________________________________ Contact info for Lactation Consultants: ________________________________________  REST and SLEEP *You each need at least 4-5 hours of uninterrupted sleep every day. Write specific names and contact information.* How are you going to rest in the postpartum period? While partner's home? When partner returns to work? When you both return to work? Where will your baby sleep? Who is available to help during the day? Evening? Night? Who could move in for a period to help support you? What are some ideas to help you get enough sleep? __________________________________________________________________________________________________________________________________________________________________________________________________________________________________________ NUTRITIOUS FOOD AND DRINK *Plan for meals before your baby is born so you can have healthy food to eat during the immediate postpartum period.* Who will look after breakfast? Lunch? Dinner? List names and contact information. Brainstorm quick, healthy ideas for each meal. What can you do before baby is born to prepare meals for the postpartum period? How can others help you with meals? Which grocery stores provide online shopping and delivery? Which restaurants offer take-out or delivery  options? ______________________________________________________________________________________________________________________________________________________________________________________________________________________________________________________________________________________________________________________________________________________________________________________________________  CARE FOR MOM *It's important that mom is cared for and pampered in the postpartum period. Remember, the most important ways new mothers need care are: sleep, nutrition, gentle exercise, and time off.* Who can come take care of mom during this period? Make a list of people with their contact information. List some activities that make you feel cared for, rested, and energized? Who can make sure you have opportunities to do these things? Does mom have a space of her very own within your home that's just for her? Make a "[facility]" where she can be comfortable, rest, and renew herself daily. ______________________________________________________________________________________________________________________________________________________________________________________________________________________________________________________________________________________________________________________________________________________________________________________________________    CARE FOR AND FEEDING BABY *Knowledgeable and encouraging people will offer the best support with regard to feeding your baby.* Educate yourself and choose the best feeding option for your baby. Make a list of people who will guide, support, and be a resource for you as your care for and feed your baby. (Friends that have breastfed or are currently breastfeeding, lactation consultants, breastfeeding support groups, etc.) Consider a postpartum doula. (These websites can give you information: dona.org & https://shea.org/) Seek out local  breastfeeding resources like the breastfeeding support group at [employer] or [employer]. ______________________________________________________________________________________________________________________________________________________________________________________________________________________________________________________________________________________________________________________________________________________________________________________________________  GAITHER AND ERRANDS Who can help with a thorough cleaning before baby is born? Make a list of people who will help with housekeeping and chores, like laundry, light cleaning, dishes, bathrooms, etc. Who can run some errands for you? What can you do to make sure you are stocked with basic supplies before baby is born? Who is going to do the shopping? ______________________________________________________________________________________________________________________________________________________________________________________________________________________________________________________________________________________________________________________________________________________________________________________________________     Family Adjustment *Nurture yourselves.it helps parents be more loving and allows for better bonding with their child.* What sorts of things do you and partner enjoy doing together? Which activities help you to connect and strengthen your relationship? Make a list of those things. Make a list of people whom you trust to care for your baby so you can have some time together as a couple. What types of things help partner feel connected to Mom? Make a list. What needs will partner have in order to bond with baby? Other children? Who will care for them when you go into labor and while you are in the hospital? Think about what  the needs of your older children might be. Who can help you meet those  needs? In what ways are you helping them prepare for bringing baby home? List some specific strategies you have for family adjustment. _______________________________________________________________________________________________________________________________________________________________________________________________________________________________________________________________________________________________________________________________________________  SUPPORT *Someone who can empathize with experiences normalizes your problems and makes them more bearable.* Make a list of other friends, neighbors, and/or co-workers you know with infants (and small children, if applicable) with whom you can connect. Make a list of local or online support groups, mom groups, etc. in which you can be involved. ______________________________________________________________________________________________________________________________________________________________________________________________________________________________________________________________________________________________________________________________________________________________________________________________________  Childcare Plans Investigate and plan for childcare if mom is returning to work. Talk about mom's concerns about her transition back to work. Talk about partner's concerns regarding this transition.  Mental Health *Your mental health is one of the highest priorities for a pregnant or postpartum mom.* 1 in 5 women experience anxiety and/or depression from the time of conception through the first year after birth. Postpartum Mood Disorders are the #1 complication of pregnancy and childbirth and the suffering experienced by these mothers is not necessary! These illnesses are temporary and respond well to treatment, which often includes self-care, social support, talk therapy, and medication when needed. Women  experiencing anxiety and depression often say things like: "I'm supposed to be happy.why do I feel so sad?", "Why can't I snap out of it?", "I'm having thoughts that scare me." There is no need to be embarrassed if you are feeling these symptoms: Overwhelmed, anxious, angry, sad, guilty, irritable, hopeless, exhausted but can't sleep You are NOT alone. You are NOT to blame. With help, you WILL be well. Where can I find help? Medical professionals such as your OB, midwife, gynecologist, family practitioner, primary care provider, pediatrician, or mental health providers; [facility] support groups: Feelings After Birth, Breastfeeding Support Group, Baby and Me Group, and Fit 4 Two exercise classes. You have permission to ask for help. It will confirm your feelings, validate your experiences, share/learn coping strategies, and gain support and encouragement as you heal. You are important! BRAINSTORM Make a list of local resources, including resources for mom and for partner. Identify support groups. Identify people to call late at night - include names and contact info. Talk with partner about perinatal mood and anxiety disorders. Talk with your OB, midwife, and doula about baby blues and about perinatal mood and anxiety disorders. Talk with your pediatrician about perinatal mood and anxiety disorders.   Support & Sanity Savers   What do you really need?  Basics In preparing for a new baby, many expectant parents spend hours shopping for baby clothes, decorating the nursery, and deciding which car seat to buy. Yet most don't think much about what the reality of parenting a newborn will be like, and what they need to make it through that. So, here is the advice of experienced parents. We know you'll read this, and think "they're exaggerating, I don't really need that." Just trust us  on these, OK? Plan for all of this, and if it turns out you don't need it, come back and teach us  how you did  it!  Must-Haves (Once baby's survival needs are met, make sure you attend to your own survival needs!) Sleep An average newborn sleeps 16-18 hours per day, over 6-7 sleep periods, rarely more than three hours at a time. It is normal and healthy for a newborn to wake throughout the night... but really hard on parents!! Naps. Prioritize sleep above any responsibilities like: cleaning house, visiting friends,  running errands, etc.  Sleep whenever baby sleeps. If you can't nap, at least have restful times when baby eats. The more rest you get, the more patient you will be, the more emotionally stable, and better at solving problems.  Food You may not have realized it would be difficult to eat when you have a newborn. Yet, when we talk to countless new parents, they say things like "it may be 2:00 pm when I realize I haven't had breakfast yet." Or "every time we sit down to dinner, baby needs to eat, and my food gets cold, so I don't bother to eat it." Finger food. Before your baby is born, stock up with one months' worth of food that: 1) you can eat with one hand while holding a baby, 2) doesn't need to be prepped, 3) is good hot or cold, 4) doesn't spoil when left out for a few hours, and 5) you like to eat. Think about: nuts, dried fruit, Clif bars, pretzels, jerky, gogurt, baby carrots, apples, bananas, crackers, cheez-n-crackers, string cheese, hot pockets or frozen burritos to microwave, garden burgers and breakfast pastries to put in the toaster, yogurt drinks, etc. Restaurant Menus. Make lists of your favorite restaurants & menu items. When family/friends want to help, you can give specific information without much thought. They can either bring you the food or send gift cards for just the right meals. Freezer Meals.  Take some time to make a few meals to put in the freezer ahead of time.  Easy to freeze meals can be anything such as soup, lasagna, chicken pie, or spaghetti sauce. Set up a Meal  Schedule.  Ask friends and family to sign up to bring you meals during the first few weeks of being home. (It can be passed around at baby showers!) You have no idea how helpful this will be until you are in the throes of parenting.  MachineLive.it is a great website to check out. Emotional Support Know who to call when you're stressed out. Parenting a newborn is very challenging work. There are times when it totally overwhelms your normal coping abilities. EVERY NEW PARENT NEEDS TO HAVE A PLAN FOR WHO TO CALL WHEN THEY JUST CAN'T COPE ANY MORE. (And it has to be someone other than the baby's other parent!) Before your baby is born, come up with at least one person you can call for support - write their phone number down and post it on the refrigerator. Anxiety & Sadness. Baby blues are normal after pregnancy; however, there are more severe types of anxiety & sadness which can occur and should not be ignored.  They are always treatable, but you have to take the first step by reaching out for help. [facility] offers a "Mom Talk" group which meets every Tuesday from 10 am - 11 am.  This group is for new moms who need support and connection after their babies are born.  Call [phone].  Really, Really Helpful (Plan for them! Make sure these happen often!!) Physical Support with Taking Care of Yourselves Asking friends and family. Before your baby is born, set up a schedule of people who can come and visit and help out (or ask a friend to schedule for you). Any time someone says "let me know what I can do to help," sign them up for a day. When they get there, their job is not to take care of the baby (that's your job and your joy). Their job is to take  care of you!  Postpartum doulas. If you don't have anyone you can call on for support, look into postpartum doulas:  professionals at helping parents with caring for baby, caring for themselves, getting breastfeeding started, and helping with  household tasks. www.padanc.org is a helpful website for learning about doulas in our area. Peer Support / Parent Groups Why: One of the greatest ideas for new parents is to be around other new parents. Parent groups give you a chance to share and listen to others who are going through the same season of life, get a sense of what is normal infant development by watching several babies learn and grow, share your stories of triumph and struggles with empathetic ears, and forgive your own mistakes when you realize all parents are learning by trial and error. Where to find: There are many places you can meet other new parents throughout our community.  [facility] offers the following classes for new moms and their little ones:  Baby and Me (Birth to Crawling) and Breastfeeding Support Group. Go to www.conehealthybaby.com or call [phone] for more information. Time for your Relationship It's easy to get so caught up in meeting baby's immediate needs that it's hard to find time to connect with your partner, and meet the needs of your relationship. It's also easy to forget what "quality time with your partner" actually looks like. If you take your baby on a date, you'd be amazed how much of your couple time is spent feeding the baby, diapering the baby, admiring the baby, and talking about the baby. Dating: Try to take time for just the two of you. Babysitter tip: Sometimes when moms are breastfeeding a newborn, they find it hard to figure out how to schedule outings around baby's unpredictable feeding schedules. Have the babysitter come for a three hour period. When she comes over, if baby has just eaten, you can leave right away, and come back in two hours. If baby hasn't fed recently, you start the date at home. Once baby gets hungry and gets a good feeding in, you can head out for the rest of your date time. Date Nights at Home: If you can't get out, at least set aside one evening a week to prioritize  your relationship: whenever baby dozes off or doesn't have any immediate needs, spend a little time focusing on each other. Potential conflicts: The main relationship conflicts that come up for new parents are: issues related to sexuality, financial stresses, a feeling of an unfair division of household tasks, and conflicts in parenting styles. The more you can work on these issues before baby arrives, the better!  Fun and Frills (Don't forget these. and don't feel guilty for indulging in them!) Everyone has something in life that is a fun little treat that they do just for themselves. It may be: reading the morning paper, or going for a daily jog, or having coffee with a friend once a week, or going to a movie on Friday nights, or fine chocolates, or bubble baths, or curling up with a good book. Unless you do fun things for yourself every now and then, it's hard to have the energy for fun with your baby. Whatever your "special" treats are, make sure you find a way to continue to indulge in them after your baby is born. These special moments can recharge you, and allow you to return to baby with a new joy   PERINATAL MOOD DISORDERS: MATERNAL MENTAL HEALTH FROM CONCEPTION THROUGH THE  POSTPARTUM PERIOD   _________________________________________Emergency and Crisis Resources If you are an imminent risk to self or others, are experiencing intense personal distress, and/or have noticed significant changes in activities of daily living, call:  911 [facility]: [phone]  [address] [place], [place], [address] Mobile Crisis: [phone] National Suicide Hotline: 44 Or visit the following crisis centers: Local Emergency Departments [facility]:  [address] [place]  Mon-Friday 8am-3pm, [phone]                                                                                  ___________ Non-Crisis Resources To identify specific providers that are covered by your  insurance, contact your insurance company or local agencies:   Postpartum Support International- Warm-line: (336)721-9654                                                      __Outpatient Therapy and Medication Management   Providers:  [facility]: [phone] Hours: 9AM-5PM  Insurance Accepted: BRYAN [place], BCBS, Sherleen Hough, [place], Medicare  [employer] [facility] Care): [phone] Hours: 8AM-5:30PM  nsurance Accepted: All insurances EXCEPT AARP, [place], [place], and [employer] of the [place]: [phone] Hours: 8AM-8PM Insurance Accepted: [place] OLIPHANT, Sherleen Hough, [place], Medicare, Delano Gasper [place] [phone] [facility]: [phone] Hours: 8:30AM-7PM Insurance Accepted: [place] OLIPHANT, Medicaid, Medicare, Tricare, [employer] Counseling:  [phone]   Hours:9AM-5PM Insurance Accepted:  [place], OLIPHANT, [employer], [place], [employer] Care  [facility]: [phone] Hours: 9AM-5:30PM Insurance Accepted: BRYAN Ley[place], OLIPHANT Sherleen, and Medicaid, Medicare, [facility]:  [phone] Hours: 9am-5pm Insurance Accepted: BCBS; they do not accept Medicaid/Medicare [facility]: [phone] Hours: 9am-9pm Insurance Accepted: All major insurance including Medicaid and Medicare [facility]: [phone] Hours: 9AM- 5PM Insurance Accepted: All insurances EXCEPT Medicaid and Medicare. [facility]: [phone]   ____________                                                                     Parenting Support Groups [facility] :  [address], [place], [place], [address] [phone] [facility]:  [phone] Family Support Network: (support for children in the NICU and/or with special needs),  [phone]     _____________  Online Resources Postpartum Support International: SeekAlumni.co.za  800-944-4PPD Supporting Moms:  www.momssupportingmoms.net

## 2024-06-16 NOTE — Addendum Note (Signed)
 Addended by: ERLA PIERCE C on: 06/16/2024 11:13 AM   Modules accepted: Orders

## 2024-06-23 ENCOUNTER — Ambulatory Visit (INDEPENDENT_AMBULATORY_CARE_PROVIDER_SITE_OTHER): Admitting: Obstetrics & Gynecology

## 2024-06-23 ENCOUNTER — Other Ambulatory Visit (HOSPITAL_COMMUNITY)
Admission: RE | Admit: 2024-06-23 | Discharge: 2024-06-23 | Disposition: A | Discharge status: Home / Self Care | Payer: MEDICAID | Source: Ambulatory Visit | Attending: Obstetrics & Gynecology | Admitting: Obstetrics & Gynecology

## 2024-06-23 ENCOUNTER — Encounter (HOSPITAL_BASED_OUTPATIENT_CLINIC_OR_DEPARTMENT_OTHER): Payer: Self-pay | Admitting: Obstetrics & Gynecology

## 2024-06-23 VITALS — BP 122/85 | HR 84 | Ht 67.0 in | Wt 326.6 lb

## 2024-06-23 DIAGNOSIS — Z124 Encounter for screening for malignant neoplasm of cervix: Secondary | ICD-10-CM

## 2024-06-23 DIAGNOSIS — Z87898 Personal history of other specified conditions: Secondary | ICD-10-CM | POA: Insufficient documentation

## 2024-06-23 DIAGNOSIS — O99512 Diseases of the respiratory system complicating pregnancy, second trimester: Secondary | ICD-10-CM

## 2024-06-23 DIAGNOSIS — Z3401 Encounter for supervision of normal first pregnancy, first trimester: Secondary | ICD-10-CM

## 2024-06-23 DIAGNOSIS — Z3A16 16 weeks gestation of pregnancy: Secondary | ICD-10-CM

## 2024-06-23 DIAGNOSIS — Z3402 Encounter for supervision of normal first pregnancy, second trimester: Secondary | ICD-10-CM

## 2024-06-23 DIAGNOSIS — O99212 Obesity complicating pregnancy, second trimester: Secondary | ICD-10-CM | POA: Diagnosis not present

## 2024-06-23 DIAGNOSIS — J45909 Unspecified asthma, uncomplicated: Secondary | ICD-10-CM | POA: Diagnosis not present

## 2024-06-23 NOTE — Progress Notes (Signed)
 History:   Whitney Beard is a 23 y.o. G1P0 at [redacted]w[redacted]d by LMP being seen today for her first obstetrical visit.  Her obstetrical history is significant for obesity. Patient does intend to breast feed. Pregnancy history fully reviewed.  Patient reports no complaints.  Does have hx of borderline personality d/o.  Seeing Warren Mering and reports they are working on finding a specific therapy/provider for her for long term care.      HISTORY: OB History  Gravida Para Term Preterm AB Living  1 0 0 0 0 0  SAB IAB Ectopic Multiple Live Births  0 0 0 0 0    # Outcome Date GA Lbr Len/2nd Weight Sex Type Anes PTL Lv  1 Current             Never had a pap smear.  Will obtain today.  Past Medical History:  Diagnosis Date   ADHD (attention deficit hyperactivity disorder)    Allergic rhinitis    Asthma    Bipolar 1 disorder (HCC)    BMI 45.0-49.9, adult (HCC)    Obesity    Prediabetes    states today 11/11/20 was told in the past that she was pre- diabetic   Sleep apnea    Suicide attempt [facility])    Past Surgical History:  Procedure Laterality Date   ADENOIDECTOMY     TONSILLECTOMY     Family History  Problem Relation Age of Onset   Bipolar disorder Father    Schizophrenia Father    Liver disease Father    Asthma Father    Asthma Maternal Grandmother    Schizophrenia Maternal Grandmother    SIDS Maternal Aunt    Heart disease Paternal Grandmother    Diabetes Paternal Grandmother    Seizures Paternal Grandmother    Social History   Tobacco Use   Smoking status: Former    Current packs/day: 0.50    Types: Cigarettes   Smokeless tobacco: Never   Tobacco comments:    vapes   Vaping Use   Vaping status: Former  Substance Use Topics   Alcohol use: Not Currently    Comment: 1-3 times a week    Drug use: Not Currently   Allergies  Allergen Reactions   Other Shortness Of Breath    Animals such as cats and dogs and pollen - sneezing, asthma trigger    Current Outpatient  Medications on File Prior to Visit  Medication Sig Dispense Refill   acetaminophen  (TYLENOL ) 500 MG tablet Take 1 tablet (500 mg total) by mouth every 6 (six) hours as needed for headache. 30 tablet    albuterol  (PROVENTIL ) (2.5 MG/3ML) 0.083% nebulizer solution Inhale 1 vial via nebulizer 3 (three) times daily for 5 days, THEN 1 vial every 4 (four) hours as needed for wheezing or shortness of breath. 75 mL 1   albuterol  (VENTOLIN  HFA) 108 (90 Base) MCG/ACT inhaler Inhale 2 puffs into the lungs every 4 (four) hours as needed for wheezing or shortness of breath. 18 g 1   Blood Pressure Monitoring (BLOOD PRESSURE KIT) DEVI Check BP daily 1 each 0   busPIRone  (BUSPAR ) 10 MG tablet Take 1 tablet (10 mg total) by mouth 3 (three) times daily as needed. 30 tablet 0   ondansetron  (ZOFRAN ) 4 MG tablet Take 1 tablet (4 mg total) by mouth every 8 (eight) hours as needed for nausea or vomiting. 30 tablet 3   prenatal vitamin w/FE, FA (PRENATAL 1 + 1) 27-1 MG TABS tablet  Take 1 tablet by mouth daily at 12 noon. 30 tablet 12   budesonide -formoterol  (SYMBICORT ) 160-4.5 MCG/ACT inhaler Inhale 2 puffs into the lungs 2 (two) times daily. (Patient not taking: Reported on 06/23/2024) 10.2 g 2   No current facility-administered medications on file prior to visit.    Review of Systems Pertinent items noted in HPI and remainder of comprehensive ROS otherwise negative.  Physical Exam:   Vitals:   06/23/24 1034  BP: 122/85  Pulse: 84  Weight: (!) 326 lb 9.6 oz (148.1 kg)   Fetal Heart Rate (bpm): 145 Patient informed that the ultrasound is considered a limited obstetric ultrasound and is not intended to be a complete ultrasound exam.  Patient also informed that the ultrasound is not being completed with the intent of assessing for fetal or placental anomalies or any pelvic abnormalities.  Explained that the purpose of today's ultrasound is to assess for fetal heart rate.  Patient acknowledges the purpose of the exam  and the limitations of the study. General: well-developed, well-nourished female in no acute distress  Breasts:  deferred  Skin: normal coloration and turgor, no rashes  Neurologic: oriented, normal, negative, normal mood  Extremities: normal strength, tone, and muscle mass, ROM of all joints is normal  HEENT PERRLA, extraocular movement intact and sclera clear, anicteric  Neck supple and no masses  Cardiovascular: regular rate and rhythm  Respiratory:  no respiratory distress, normal breath sounds  Abdomen: soft, non-tender; bowel sounds normal; no masses,  no organomegaly  Pelvic: normal external genitalia, no lesions, normal vaginal mucosa, normal vaginal discharge, normal cervix, pap smear done. Uterine size: Fundal Height: 16 cm    Assessment:    Pregnancy: G1P0 Patient Active Problem List   Diagnosis Date Noted   History of prediabetes 06/23/2024   H/O suicide attempt 05/23/2024   Encounter for supervision of normal first pregnancy in second trimester 05/22/2024   Maternal morbid obesity, antepartum (HCC) 05/22/2024   Asthma during pregnancy 05/22/2024   Severe persistent allergic asthma 03/03/2022   Severe bipolar I disorder, current or most recent episode depressed (HCC) 11/11/2020   Anxiety disorder, unspecified 11/11/2020   Cannabis use disorder, mild, abuse 11/11/2020   Borderline personality disorder (HCC) 11/11/2020   Major depressive disorder 06/28/2020   OSA (obstructive sleep apnea) 12/26/2018   Class 3 severe obesity due to excess calories with serious comorbidity and body mass index (BMI) of 50.0 to 59.9 in adult 12/26/2018     Plan:    1. Encounter for supervision of normal first pregnancy in second trimester - on PNV - importance of baby ASA discussed.  Recommended starting today. - recheck 4 weeks - anatomy scan scheduled 9/2 - AFP, Serum, Open Spina Bifida  2. History of prediabetes - hba1c with prenatal labs was normal  3. [redacted] weeks gestation of  pregnancy - AFP, Serum, Open Spina Bifida  4. Asthma during pregnancy - stable at this time.  Does have inhalers if needed.  5. Cervical cancer screening - Cytology - PAP( [place])   Initial labs drawn. Continue prenatal vitamins. Problem list reviewed and updated. Genetic Screening completed and reviewed Ultrasound discussed; fetal anatomic survey: already ordered and scheduled. Anticipatory guidance about prenatal visits given including labs, ultrasounds, and testing. Discussed usage of Babyscripts and virtual visits as additional source of managing and completing prenatal visits in midst of coronavirus and pandemic.   Encouraged to complete MyChart Registration for her ability to review results, send requests, and have questions addressed.  The nature of  - Center for [facility] with multiple MDs and Advanced Practice Providers was explained to patient; also emphasized that residents, students are part of our team. Routine obstetric precautions reviewed. Encouraged to seek out care at office or emergency room [facility] preferred) for urgent and/or emergent concerns. Return in about 4 weeks (around 07/21/2024).     EMERSON Elvie Pinal, MD, FACOG Obstetrician & Gynecologist, [facility], [facility]

## 2024-06-24 ENCOUNTER — Encounter (HOSPITAL_BASED_OUTPATIENT_CLINIC_OR_DEPARTMENT_OTHER): Payer: Self-pay | Admitting: Obstetrics & Gynecology

## 2024-06-24 ENCOUNTER — Other Ambulatory Visit: Payer: Self-pay

## 2024-06-24 ENCOUNTER — Emergency Department (HOSPITAL_COMMUNITY): Payer: MEDICAID

## 2024-06-24 ENCOUNTER — Inpatient Hospital Stay (HOSPITAL_COMMUNITY)
Admission: EM | Admit: 2024-06-24 | Discharge: 2024-06-26 | DRG: 832 | Disposition: A | Discharge status: Home / Self Care | Payer: MEDICAID | Attending: Internal Medicine | Admitting: Internal Medicine

## 2024-06-24 ENCOUNTER — Encounter (HOSPITAL_COMMUNITY): Payer: Self-pay | Admitting: Emergency Medicine

## 2024-06-24 DIAGNOSIS — Z7982 Long term (current) use of aspirin: Secondary | ICD-10-CM | POA: Diagnosis not present

## 2024-06-24 DIAGNOSIS — O99342 Other mental disorders complicating pregnancy, second trimester: Secondary | ICD-10-CM | POA: Diagnosis present

## 2024-06-24 DIAGNOSIS — Z3A16 16 weeks gestation of pregnancy: Secondary | ICD-10-CM

## 2024-06-24 DIAGNOSIS — F909 Attention-deficit hyperactivity disorder, unspecified type: Secondary | ICD-10-CM | POA: Diagnosis present

## 2024-06-24 DIAGNOSIS — Z87891 Personal history of nicotine dependence: Secondary | ICD-10-CM | POA: Diagnosis not present

## 2024-06-24 DIAGNOSIS — Z91048 Other nonmedicinal substance allergy status: Secondary | ICD-10-CM

## 2024-06-24 DIAGNOSIS — O99512 Diseases of the respiratory system complicating pregnancy, second trimester: Principal | ICD-10-CM | POA: Diagnosis present

## 2024-06-24 DIAGNOSIS — F319 Bipolar disorder, unspecified: Secondary | ICD-10-CM | POA: Diagnosis present

## 2024-06-24 DIAGNOSIS — O99212 Obesity complicating pregnancy, second trimester: Secondary | ICD-10-CM | POA: Diagnosis present

## 2024-06-24 DIAGNOSIS — Z833 Family history of diabetes mellitus: Secondary | ICD-10-CM

## 2024-06-24 DIAGNOSIS — Z79899 Other long term (current) drug therapy: Secondary | ICD-10-CM | POA: Diagnosis not present

## 2024-06-24 DIAGNOSIS — Z7951 Long term (current) use of inhaled steroids: Secondary | ICD-10-CM

## 2024-06-24 DIAGNOSIS — Z1152 Encounter for screening for COVID-19: Secondary | ICD-10-CM

## 2024-06-24 DIAGNOSIS — R7303 Prediabetes: Secondary | ICD-10-CM | POA: Diagnosis present

## 2024-06-24 DIAGNOSIS — Z8249 Family history of ischemic heart disease and other diseases of the circulatory system: Secondary | ICD-10-CM

## 2024-06-24 DIAGNOSIS — J45901 Unspecified asthma with (acute) exacerbation: Secondary | ICD-10-CM | POA: Diagnosis present

## 2024-06-24 DIAGNOSIS — O99282 Endocrine, nutritional and metabolic diseases complicating pregnancy, second trimester: Secondary | ICD-10-CM | POA: Diagnosis present

## 2024-06-24 LAB — CBC WITH DIFFERENTIAL/PLATELET
Abs Immature Granulocytes: 0.04 K/uL (ref 0.00–0.07)
Basophils Absolute: 0 K/uL (ref 0.0–0.1)
Basophils Relative: 1 %
Eosinophils Absolute: 0.1 K/uL (ref 0.0–0.5)
Eosinophils Relative: 1 %
HCT: 33 % — ABNORMAL LOW (ref 36.0–46.0)
Hemoglobin: 11.3 g/dL — ABNORMAL LOW (ref 12.0–15.0)
Immature Granulocytes: 1 %
Lymphocytes Relative: 27 %
Lymphs Abs: 2.2 K/uL (ref 0.7–4.0)
MCH: 32.1 pg (ref 26.0–34.0)
MCHC: 34.2 g/dL (ref 30.0–36.0)
MCV: 93.8 fL (ref 80.0–100.0)
Monocytes Absolute: 0.5 K/uL (ref 0.1–1.0)
Monocytes Relative: 6 %
Neutro Abs: 5.4 K/uL (ref 1.7–7.7)
Neutrophils Relative %: 64 %
Platelets: 230 K/uL (ref 150–400)
RBC: 3.52 MIL/uL — ABNORMAL LOW (ref 3.87–5.11)
RDW: 12.6 % (ref 11.5–15.5)
WBC: 8.3 K/uL (ref 4.0–10.5)
nRBC: 0 % (ref 0.0–0.2)

## 2024-06-24 LAB — BASIC METABOLIC PANEL WITH GFR
Anion gap: 7 (ref 5–15)
BUN: 8 mg/dL (ref 6–20)
CO2: 17 mmol/L — ABNORMAL LOW (ref 22–32)
Calcium: 7.9 mg/dL — ABNORMAL LOW (ref 8.9–10.3)
Chloride: 109 mmol/L (ref 98–111)
Creatinine, Ser: 0.51 mg/dL (ref 0.44–1.00)
GFR, Estimated: >60 mL/min (ref >60)
Glucose, Bld: 130 mg/dL — ABNORMAL HIGH (ref 70–99)
Potassium: 3.5 mmol/L (ref 3.5–5.1)
Sodium: 133 mmol/L — ABNORMAL LOW (ref 135–145)

## 2024-06-24 LAB — HCG, SERUM, QUALITATIVE: Preg, Serum: POSITIVE — AB

## 2024-06-24 LAB — CBC
HCT: 33.1 % — ABNORMAL LOW (ref 36.0–46.0)
Hemoglobin: 11.4 g/dL — ABNORMAL LOW (ref 12.0–15.0)
MCH: 31.6 pg (ref 26.0–34.0)
MCHC: 34.4 g/dL (ref 30.0–36.0)
MCV: 91.7 fL (ref 80.0–100.0)
Platelets: 251 K/uL (ref 150–400)
RBC: 3.61 MIL/uL — ABNORMAL LOW (ref 3.87–5.11)
RDW: 12.5 % (ref 11.5–15.5)
WBC: 8.9 K/uL (ref 4.0–10.5)
nRBC: 0 % (ref 0.0–0.2)

## 2024-06-24 LAB — RESP PANEL BY RT-PCR (RSV, FLU A&B, COVID)  RVPGX2
Influenza A by PCR: NEGATIVE
Influenza B by PCR: NEGATIVE
Resp Syncytial Virus by PCR: NEGATIVE
SARS Coronavirus 2 by RT PCR: NEGATIVE

## 2024-06-24 LAB — BRAIN NATRIURETIC PEPTIDE: B Natriuretic Peptide: 23.8 pg/mL (ref 0.0–100.0)

## 2024-06-24 LAB — CREATININE, SERUM
Creatinine, Ser: 0.7 mg/dL (ref 0.44–1.00)
GFR, Estimated: >60 mL/min (ref >60)

## 2024-06-24 MED ORDER — IPRATROPIUM BROMIDE 0.02 % IN SOLN
0.5000 mg | Freq: Once | RESPIRATORY_TRACT | Status: AC
  Administered 2024-06-24: 0.5 mg via RESPIRATORY_TRACT
  Filled 2024-06-24: qty 2.5

## 2024-06-24 MED ORDER — IPRATROPIUM-ALBUTEROL 0.5-2.5 (3) MG/3ML IN SOLN
3.0000 mL | Freq: Once | RESPIRATORY_TRACT | Status: AC
  Administered 2024-06-24: 3 mL via RESPIRATORY_TRACT
  Filled 2024-06-24: qty 3

## 2024-06-24 MED ORDER — METHYLPREDNISOLONE SODIUM SUCC 125 MG IJ SOLR: 125.0000 mg | Freq: Once | INTRAMUSCULAR | Status: DC

## 2024-06-24 MED ORDER — IPRATROPIUM-ALBUTEROL 0.5-2.5 (3) MG/3ML IN SOLN
3.0000 mL | Freq: Four times a day (QID) | RESPIRATORY_TRACT | Status: DC
  Administered 2024-06-24 – 2024-06-26 (×7): 3 mL via RESPIRATORY_TRACT
  Filled 2024-06-24 (×8): qty 3

## 2024-06-24 MED ORDER — METHYLPREDNISOLONE SODIUM SUCC 125 MG IJ SOLR
125.0000 mg | INTRAMUSCULAR | Status: AC
  Administered 2024-06-24 – 2024-06-25 (×2): 125 mg via INTRAVENOUS
  Filled 2024-06-24 (×3): qty 2

## 2024-06-24 MED ORDER — ASPIRIN 81 MG PO TBEC: 81.0000 mg | DELAYED_RELEASE_TABLET | Freq: Every day | ORAL | Status: DC

## 2024-06-24 MED ORDER — ALBUTEROL SULFATE (2.5 MG/3ML) 0.083% IN NEBU
10.0000 mg/h | INHALATION_SOLUTION | Freq: Once | RESPIRATORY_TRACT | Status: AC
  Administered 2024-06-24: 10 mg/h via RESPIRATORY_TRACT
  Filled 2024-06-24: qty 12

## 2024-06-24 MED ORDER — BUDESONIDE 0.25 MG/2ML IN SUSP
0.2500 mg | Freq: Two times a day (BID) | RESPIRATORY_TRACT | Status: DC
  Administered 2024-06-24 – 2024-06-26 (×5): 0.25 mg via RESPIRATORY_TRACT
  Filled 2024-06-24 (×5): qty 2

## 2024-06-24 MED ORDER — FUROSEMIDE 10 MG/ML IJ SOLN
40.0000 mg | Freq: Once | INTRAMUSCULAR | Status: AC
  Administered 2024-06-24: 40 mg via INTRAVENOUS
  Filled 2024-06-24: qty 4

## 2024-06-24 MED ORDER — FLUTICASONE PROPIONATE 50 MCG/ACT NA SUSP
1.0000 | Freq: Two times a day (BID) | NASAL | Status: DC
  Administered 2024-06-24 – 2024-06-26 (×5): 1 via NASAL
  Filled 2024-06-24: qty 16

## 2024-06-24 MED ORDER — ENOXAPARIN SODIUM 80 MG/0.8ML IJ SOSY
70.0000 mg | PREFILLED_SYRINGE | INTRAMUSCULAR | Status: DC
  Filled 2024-06-24: qty 0.7

## 2024-06-24 MED ORDER — PRENATAL PLUS 27-1 MG PO TABS
1.0000 | ORAL_TABLET | Freq: Every day | ORAL | Status: DC
  Administered 2024-06-24 – 2024-06-26 (×3): 1 via ORAL
  Filled 2024-06-24 (×3): qty 1

## 2024-06-24 MED ORDER — ENOXAPARIN SODIUM 40 MG/0.4ML IJ SOSY
40.0000 mg | PREFILLED_SYRINGE | INTRAMUSCULAR | Status: DC
  Administered 2024-06-24 – 2024-06-25 (×2): 40 mg via SUBCUTANEOUS
  Filled 2024-06-24 (×2): qty 0.4

## 2024-06-24 NOTE — ED Notes (Signed)
 CCMD called by this RN

## 2024-06-24 NOTE — ED Triage Notes (Signed)
 Pt BIB [facility] from home due to asthma.  Pt was having difficulty breathing over the night.  Inhaler was not helping.  Bilateral wheezing.  15 albuterol , 1mg  atrovent , 125 solumedrol, 4 magnesium .  20g left AC.   VS BP 126/86, HR 123, SpO2 98% nebulizer.

## 2024-06-24 NOTE — H&P (Signed)
 History and Physical    Patient: Whitney Beard FMW:[id number] DOB: 09-09-2001 DOA: 06/24/2024 DOS: the patient was seen and examined on 06/24/2024 PCP: Wilmon Penton, NP  Patient coming from: Home  Chief Complaint: No chief complaint on file.  HPI: Whitney Beard is a 23 y.o. female with medical history significant of ADHD, allergic rhinitis, asthma, BPD 1, suicide attempt, OSA, and obesity p/w acute asthma exacerbation.  Pt was in her USOH until yesterday evening when she noticed she was SOB, she used her rescue inhaler 3 times that day, which is not normal for her. When she went tried to go to bed that evening, she was unable to get any sleep due to her SOB; infact, she used her inhaler multiples every hour in order to make it to the following morning at 0700 when she called her mother, who activated EMS (NOTE: Pt advised to not wait in the future). Of note, pt reports that she was previously on maintenance inhalers for her asthma, that her insurance no longer covers.  In the ED, tachycardic, hypertensive, and tachypneic on 2L [place] (on RA pta). Labs unremarkable, except positive pregnancy test. EDP admitted pt for ongiong care iso asthma exacerbation.  Review of Systems: As mentioned in the history of present illness. All other systems reviewed and are negative. Past Medical History:  Diagnosis Date   ADHD (attention deficit hyperactivity disorder)    Allergic rhinitis    Asthma    Bipolar 1 disorder (HCC)    BMI 45.0-49.9, adult (HCC)    Obesity    Prediabetes    states today 11/11/20 was told in the past that she was pre- diabetic   Sleep apnea    Suicide attempt [facility])    Past Surgical History:  Procedure Laterality Date   ADENOIDECTOMY     TONSILLECTOMY     Social History:  reports that she has quit smoking. Her smoking use included cigarettes. She has never used smokeless tobacco. She reports that she does not currently use alcohol. She reports that she does not currently use  drugs.  Allergies  Allergen Reactions   Other Shortness Of Breath    Animals such as cats and dogs and pollen - sneezing, asthma trigger     Family History  Problem Relation Age of Onset   Bipolar disorder Father    Schizophrenia Father    Liver disease Father    Asthma Father    Asthma Maternal Grandmother    Schizophrenia Maternal Grandmother    SIDS Maternal Aunt    Heart disease Paternal Grandmother    Diabetes Paternal Grandmother    Seizures Paternal Grandmother     Prior to Admission medications   Medication Sig Start Date End Date Taking? Authorizing Provider  acetaminophen  (TYLENOL ) 500 MG tablet Take 1 tablet (500 mg total) by mouth every 6 (six) hours as needed for headache. 12/23/22  Yes Tobie Yetta HERO, MD  albuterol  (VENTOLIN  HFA) 108 (90 Base) MCG/ACT inhaler Inhale 2 puffs into the lungs every 4 (four) hours as needed for wheezing or shortness of breath. 08/28/23  Yes Sebastian Toribio GAILS, MD  aspirin  EC 81 MG tablet Take 1 tablet (81 mg total) by mouth daily. Swallow whole. 06/24/24  Yes Cleotilde Ronal RAMAN, MD  busPIRone  (BUSPAR ) 10 MG tablet Take 1 tablet (10 mg total) by mouth 3 (three) times daily as needed. Patient taking differently: Take 10 mg by mouth daily. 05/22/24  Yes Lo, Arland POUR, CNM  ondansetron  (ZOFRAN ) 4 MG tablet Take  1 tablet (4 mg total) by mouth every 8 (eight) hours as needed for nausea or vomiting. 05/22/24  Yes Lo, Arland POUR, CNM  prenatal vitamin w/FE, FA (PRENATAL 1 + 1) 27-1 MG TABS tablet Take 1 tablet by mouth daily at 12 noon. 05/22/24  Yes Lo, Arland POUR, CNM  albuterol  (PROVENTIL ) (2.5 MG/3ML) 0.083% nebulizer solution Inhale 1 vial via nebulizer 3 (three) times daily for 5 days, THEN 1 vial every 4 (four) hours as needed for wheezing or shortness of breath. Patient not taking: Reported on 06/24/2024 08/28/23 09/01/24  Sebastian Toribio GAILS, MD  Blood Pressure Monitoring (BLOOD PRESSURE KIT) DEVI Check BP daily 05/22/24   Tad Arland POUR, CNM    Physical  Exam: Vitals:   06/24/24 0903 06/24/24 0915 06/24/24 1015 06/24/24 1145  BP:  138/83 (!) 145/89 (!) 151/84  Pulse:  (!) 110 (!) 108 (!) 109  Resp: (!) 26 (!) 26 (!) 24   Temp:  98.3 F (36.8 C)    TempSrc:  Oral    SpO2:  100% 100% 100%   General: Alert, oriented x3, resting comfortably in no acute distress Respiratory: Lungs clear to auscultation bilaterally with normal respiratory effort; no w/r/r Cardiovascular: Regular rate and rhythm w/o m/r/g   Data Reviewed:  Lab Results  Component Value Date   WBC 8.3 06/24/2024   HGB 11.3 (L) 06/24/2024   HCT 33.0 (L) 06/24/2024   MCV 93.8 06/24/2024   PLT 230 06/24/2024   Lab Results  Component Value Date   GLUCOSE 130 (H) 06/24/2024   CALCIUM  7.9 (L) 06/24/2024   NA 133 (L) 06/24/2024   K 3.5 06/24/2024   CO2 17 (L) 06/24/2024   CL 109 06/24/2024   BUN 8 06/24/2024   CREATININE 0.51 06/24/2024   Lab Results  Component Value Date   ALT 9 05/22/2024   AST 8 05/22/2024   ALKPHOS 73 05/22/2024   BILITOT <0.2 05/22/2024   No results found for: INR, PROTIME  Radiology: No results found.  Assessment and Plan: 10F h/o ADHD, allergic rhinitis, asthma, BPD 1, suicide attempt, OSA, and obesity p/w acute asthma exacerbation.  Asthma exacerbation -IV solumedrol 125mg  daily for now -Budesonide  inh BID for now; consider low dose ICS on discharge -Duonebs q6 sch for now -Defer montelukast  for now given BPD1, although pt may benefit in the long run -Flonase  BID for now -IV lasix  40mg  x1 -F/u BNP; consider TTE if elevated -Wean O2 as tolerated -Ambulatory pulse ox prior to d/c -OP Pulm consult for asthma mgt during pregnacy to avoid exacerbations which can harm fetus  Advance Care Planning:   Code Status: Full Code   Consults: N/A  Family Communication: N/A  Severity of Illness: The appropriate patient status for this patient is INPATIENT. Inpatient status is judged to be reasonable and necessary in order to provide  the required intensity of service to ensure the patient's safety. The patient's presenting symptoms, physical exam findings, and initial radiographic and laboratory data in the context of their chronic comorbidities is felt to place them at high risk for further clinical deterioration. Furthermore, it is not anticipated that the patient will be medically stable for discharge from the hospital within 2 midnights of admission.   * I certify that at the point of admission it is my clinical judgment that the patient will require inpatient hospital care spanning beyond 2 midnights from the point of admission due to high intensity of service, high risk for further deterioration and high frequency  of surveillance required.*   ------- I spent 60 minutes reviewing previous labs/notes, obtaining separate history at the bedside, counseling/discussing the treatment plan outlined above, ordering medications/tests, and performing clinical documentation.  Author: Marsha Ada, MD 06/24/2024 12:10 PM  For on call review [email or web address].

## 2024-06-24 NOTE — ED Provider Notes (Signed)
 [place] EMERGENCY DEPARTMENT AT [facility] Provider Note   CSN: [id number] Arrival date & time: 06/24/24  9156     Patient presents with: No chief complaint on file.  HPI Whitney Beard is a 23 y.o. female well-appearing female presenting for asthma.  Reports that she is [redacted] weeks pregnant.  She states she was feeling very short of breath last night.  Used her inhaler several times overnight and shortness of breath was persistent.  Denies chest pain.  EMS was called.  She was found to be in respiratory distress on arrival.  Given 15 of albuterol , 1 mg Atrovent , Solu-Medrol  and 4 of magnesium .  She states overall her symptoms have improved but she still having trouble breathing.  Denies calf tenderness or swelling.  Denies cough and fever.   HPI     Prior to Admission medications   Medication Sig Start Date End Date Taking? Authorizing Provider  acetaminophen  (TYLENOL ) 500 MG tablet Take 1 tablet (500 mg total) by mouth every 6 (six) hours as needed for headache. 12/23/22   Tobie Yetta HERO, MD  albuterol  (PROVENTIL ) (2.5 MG/3ML) 0.083% nebulizer solution Inhale 1 vial via nebulizer 3 (three) times daily for 5 days, THEN 1 vial every 4 (four) hours as needed for wheezing or shortness of breath. 08/28/23 09/01/24  Sebastian Toribio GAILS, MD  albuterol  (VENTOLIN  HFA) 108 [phone] Base) MCG/ACT inhaler Inhale 2 puffs into the lungs every 4 (four) hours as needed for wheezing or shortness of breath. 08/28/23   Sebastian Toribio GAILS, MD  aspirin  EC 81 MG tablet Take 1 tablet (81 mg total) by mouth daily. Swallow whole. 06/24/24   Cleotilde Ronal RAMAN, MD  Blood Pressure Monitoring (BLOOD PRESSURE KIT) DEVI Check BP daily 05/22/24   Lo, Arland POUR, CNM  budesonide -formoterol  (SYMBICORT ) 160-4.5 MCG/ACT inhaler Inhale 2 puffs into the lungs 2 (two) times daily. Patient not taking: Reported on 06/23/2024 08/28/23   Sebastian Toribio GAILS, MD  busPIRone  (BUSPAR ) 10 MG tablet Take 1 tablet (10 mg total) by mouth 3 (three)  times daily as needed. 05/22/24   Lo, Arland POUR, CNM  ondansetron  (ZOFRAN ) 4 MG tablet Take 1 tablet (4 mg total) by mouth every 8 (eight) hours as needed for nausea or vomiting. 05/22/24   Lo, Arland POUR, CNM  prenatal vitamin w/FE, FA (PRENATAL 1 + 1) 27-1 MG TABS tablet Take 1 tablet by mouth daily at 12 noon. 05/22/24   Tad Arland POUR, CNM    Allergies: Other    Review of Systems See HPI   Physical Exam   Vitals:   06/24/24 0915 06/24/24 1015  BP: 138/83 (!) 145/89  Pulse: (!) 110 (!) 108  Resp: (!) 26 (!) 24  Temp: 98.3 F (36.8 C)   SpO2: 100% 100%    CONSTITUTIONAL:  In respiratory distress NEURO:  Alert and oriented x 3, CN 3-12 grossly intact EYES:  eyes equal and reactive ENT/NECK:  Supple, no stridor  CARDIO:  tachycardia and regular rhythm, appears well-perfused PULM:  In respiratory distress, tachypneic, subcostal retractions, diffuse inspiratory expiratory wheezing, no crackles GI/GU:  non-distended MSK/SPINE:  No gross deformities, no edema, moves all extremities  SKIN:  no rash, atraumatic  *Additional and/or pertinent findings included in MDM below  (all labs ordered are listed, but only abnormal results are displayed) Labs Reviewed  BASIC METABOLIC PANEL WITH GFR - Abnormal; Notable for the following components:      Result Value   Sodium 133 (*)  CO2 17 (*)    Glucose, Bld 130 (*)    Calcium  7.9 (*)    All other components within normal limits  CBC WITH DIFFERENTIAL/PLATELET - Abnormal; Notable for the following components:   RBC 3.52 (*)    Hemoglobin 11.3 (*)    HCT 33.0 (*)    All other components within normal limits  HCG, SERUM, QUALITATIVE - Abnormal; Notable for the following components:   Preg, Serum POSITIVE (*)    All other components within normal limits  RESP PANEL BY RT-PCR (RSV, FLU A&B, COVID)  [id number]    EKG: None  Radiology: No results found.   .Critical Care  Performed by: Lang Norleen POUR, PA-C Authorized by: Lang Norleen POUR, PA-C   Critical care provider statement:    Critical care time (minutes):  30   Critical care was time spent personally by me on the following activities:  Development of treatment plan with patient or surrogate, discussions with consultants, evaluation of patient's response to treatment, examination of patient, ordering and review of laboratory studies, ordering and review of radiographic studies, ordering and performing treatments and interventions, pulse oximetry, re-evaluation of patient's condition and review of old charts (asthma requiring continuous albuterol  and DuoNebs x 2)    Medications Ordered in the ED  albuterol  (PROVENTIL ) (2.5 MG/3ML) 0.083% nebulizer solution (10 mg/hr Nebulization Given 06/24/24 0903)  ipratropium (ATROVENT ) nebulizer solution 0.5 mg (0.5 mg Nebulization Given 06/24/24 0903)  ipratropium-albuterol  (DUONEB) 0.5-2.5 (3) MG/3ML nebulizer solution 3 mL (3 mLs Nebulization Given 06/24/24 1029)  ipratropium-albuterol  (DUONEB) 0.5-2.5 (3) MG/3ML nebulizer solution 3 mL (3 mLs Nebulization Given 06/24/24 1029)                                    Medical Decision Making Amount and/or Complexity of Data Reviewed Labs: ordered. Radiology: ordered.  Risk Prescription drug management. Decision regarding hospitalization.   Initial Impression and Ddx 23 year old female in respiratory distress presenting for shortness of breath and asthma.  Exam notable for diffuse expiratory and inspiratory wheezing, tachypnea and subcostal retractions.  Immediately placed on continuous albuterol  and Atrovent .  DDx includes asthma exacerbation, pneumonia, ACS, PE, other. Patient PMH that increases complexity of ED encounter: Pregnant and history of asthma  Interpretation of Diagnostics - I independent reviewed and interpreted the labs as followed: No acute derangements  - CXR pending  - I personally reviewed and interpreted EKG which revealed sinus tachycardia  Patient  Reassessment and Ultimate Disposition/Management On reassessment, work of breathing overall improved but still tachypneic and breath sounds still diffusely coarse with wheezing.  Workup overall suggestive of asthma exacerbation.  Given the degree of her respiratory distress although improved requiring albuterol  multiple times with Atrovent  steroids and magnesium , feel appropriate that she comes in for further evaluation.  Admitted to hospital service with Dr. Marsha Ada.  Chest x-ray is still pending but per my read appears nonacute.  Doubt pneumonia given lack of symptoms, afebrile and reassuring x-ray.  Also considered PE but unlikely given no chest pain and breath sounds more suggestive of obstructive process.  Patient management required discussion with the following services or consulting groups:  Hospitalist Service  Complexity of Problems Addressed Acute complicated illness or Injury  Additional Data Reviewed and Analyzed Further history obtained from: Past medical history and medications listed in the EMR and Prior ED visit notes  Patient Encounter Risk Assessment Consideration of hospitalization  Final diagnoses:  Exacerbation of asthma, unspecified asthma severity, unspecified whether persistent    ED Discharge Orders     None          Lang Norleen POUR, PA-C 06/24/24 1216    Mannie Pac T, DO 06/26/24 [phone]

## 2024-06-24 NOTE — ED Notes (Signed)
 RT at bedside.

## 2024-06-24 NOTE — ED Notes (Signed)
 RT coming to bedside for continuous nebulizer

## 2024-06-25 DIAGNOSIS — J45901 Unspecified asthma with (acute) exacerbation: Secondary | ICD-10-CM | POA: Diagnosis not present

## 2024-06-25 LAB — AFP, SERUM, OPEN SPINA BIFIDA
AFP MoM: 0.85
AFP Value: 20 ng/mL
Gest. Age on Collection Date: 16 wk
Maternal Age At EDD: 24.4 a
OSBR Risk 1 IN: 10000
Test Results:: NEGATIVE
Weight: 326 [lb_av]

## 2024-06-25 LAB — CBC
HCT: 33 % — ABNORMAL LOW (ref 36.0–46.0)
Hemoglobin: 11.5 g/dL — ABNORMAL LOW (ref 12.0–15.0)
MCH: 31.9 pg (ref 26.0–34.0)
MCHC: 34.8 g/dL (ref 30.0–36.0)
MCV: 91.7 fL (ref 80.0–100.0)
Platelets: 273 K/uL (ref 150–400)
RBC: 3.6 MIL/uL — ABNORMAL LOW (ref 3.87–5.11)
RDW: 12.4 % (ref 11.5–15.5)
WBC: 12.4 K/uL — ABNORMAL HIGH (ref 4.0–10.5)
nRBC: 0 % (ref 0.0–0.2)

## 2024-06-25 LAB — BASIC METABOLIC PANEL WITH GFR
Anion gap: 12 (ref 5–15)
BUN: 9 mg/dL (ref 6–20)
CO2: 19 mmol/L — ABNORMAL LOW (ref 22–32)
Calcium: 8.9 mg/dL (ref 8.9–10.3)
Chloride: 104 mmol/L (ref 98–111)
Creatinine, Ser: 0.53 mg/dL (ref 0.44–1.00)
GFR, Estimated: >60 mL/min (ref >60)
Glucose, Bld: 116 mg/dL — ABNORMAL HIGH (ref 70–99)
Potassium: 4.1 mmol/L (ref 3.5–5.1)
Sodium: 135 mmol/L (ref 135–145)

## 2024-06-25 NOTE — Plan of Care (Signed)

## 2024-06-25 NOTE — Progress Notes (Signed)
 Progress Note   Patient: Whitney Beard FMW:[id number] DOB: 11-20-2001 DOA: 06/24/2024     1 DOS: the patient was seen and examined on 06/25/2024   Brief hospital course: From HPI Whitney Beard is a 23 y.o. female who is [redacted] weeks pregnant with medical history significant of ADHD, allergic rhinitis, asthma, BPD 1, suicide attempt, OSA, and obesity p/w acute asthma exacerbation.   Pt was in her USOH until yesterday evening when she noticed she was SOB, she used her rescue inhaler 3 times that day, which is not normal for her. When she went tried to go to bed that evening, she was unable to get any sleep due to her SOB; infact, she used her inhaler multiples every hour in order to make it to the following morning at 0700 when she called her mother, who activated EMS (NOTE: Pt advised to not wait in the future). Of note, pt reports that she was previously on maintenance inhalers for her asthma, that her insurance no longer covers.   In the ED, tachycardic, hypertensive, and tachypneic on 2L [place] (on RA pta). Labs unremarkable, except positive pregnancy test. EDP admitted pt for ongiong care of asthma exacerbation.      Assessment and Plan:  Asthma exacerbation -IV solumedrol 125mg  daily for now -Budesonide  inh BID for now; consider low dose ICS on discharge - Continue Duonebs q6 sch for now -Defer montelukast  for now given BPD1, although pt may benefit in the long run - Continue Flonase  BID -IV lasix  40mg  x1 - BNP 23.8 -Wean O2 as tolerated -Ambulatory pulse ox prior to d/c - Outpatient pulm follow-up for asthma mgt during pregnacy   Pregnancy Patient follows up with obstetrician as an outpatient According to her she is [redacted] weeks pregnant Continue prenatal vitamins Outpatient follow-up with obstetrician  Psychiatric condition:  ADHD, BPD 1, suicide attempt Outpatient follow-up with her psychiatrist  Advance Care Planning:   Code Status: Full Code    Consults: N/A   Family Communication:  N/A  Subjective:   Patient seen and examined at bedside this morning Denies nausea vomiting abdominal pain chest pain or cough Still having some wheezing however improving She tells me her respiratory function today is better than on arrival  Physical Exam: General: Alert, oriented x3, resting comfortably in no acute distress Respiratory: Positive for wheezing especially in the expiratory phases bilaterally Cardiovascular: Regular rate and rhythm w/o m/r/g Abdomen: Full nondistended back obese CNS: Alert and oriented x 3 moving all extremities.  Data reviewed:     Latest Ref Rng & Units 06/25/2024    2:54 AM 06/24/2024   12:55 PM 06/24/2024    9:00 AM  CBC  WBC 4.0 - 10.5 K/uL 12.4  8.9  8.3   Hemoglobin 12.0 - 15.0 g/dL 88.4  88.5  88.6   Hematocrit 36.0 - 46.0 % 33.0  33.1  33.0   Platelets 150 - 400 K/uL 273  251  230        Latest Ref Rng & Units 06/25/2024    2:54 AM 06/24/2024   12:55 PM 06/24/2024    9:00 AM  BMP  Glucose 70 - 99 mg/dL 883   869   BUN 6 - 20 mg/dL 9   8   Creatinine 9.55 - 1.00 mg/dL 9.46  9.29  9.48   Sodium 135 - 145 mmol/L 135   133   Potassium 3.5 - 5.1 mmol/L 4.1   3.5   Chloride 98 - 111 mmol/L 104  109   CO2 22 - 32 mmol/L 19   17   Calcium  8.9 - 10.3 mg/dL 8.9   7.9     Vitals:   06/25/24 0200 06/25/24 0410 06/25/24 0841 06/25/24 0847  BP:  134/70 (!) 99/56   Pulse:  89 89 99  Resp:  18 18 16   Temp:  98 F (36.7 C) 98.2 F (36.8 C)   TempSrc:  Oral    SpO2: 97% 98% 100% 98%  Weight:      Height:        Data Reviewed: I have reviewed patient's chest x-ray that did not show any acute cardiopulmonary process I have also reviewed patient lab report as given above  Family Communication: None at bedside  Disposition: Status is: Inpatient  Time spent: 51 minutes  Author: Drue ONEIDA Potter, MD 06/25/2024 3:39 PM  For on call review [email or web address].

## 2024-06-26 ENCOUNTER — Ambulatory Visit (HOSPITAL_BASED_OUTPATIENT_CLINIC_OR_DEPARTMENT_OTHER): Payer: Self-pay | Admitting: Certified Nurse Midwife

## 2024-06-26 DIAGNOSIS — Z3402 Encounter for supervision of normal first pregnancy, second trimester: Secondary | ICD-10-CM

## 2024-06-26 DIAGNOSIS — J45901 Unspecified asthma with (acute) exacerbation: Secondary | ICD-10-CM | POA: Diagnosis not present

## 2024-06-26 DIAGNOSIS — R87612 Low grade squamous intraepithelial lesion on cytologic smear of cervix (LGSIL): Secondary | ICD-10-CM

## 2024-06-26 LAB — CBC
HCT: 33.2 % — ABNORMAL LOW (ref 36.0–46.0)
Hemoglobin: 11.5 g/dL — ABNORMAL LOW (ref 12.0–15.0)
MCH: 32.1 pg (ref 26.0–34.0)
MCHC: 34.6 g/dL (ref 30.0–36.0)
MCV: 92.7 fL (ref 80.0–100.0)
Platelets: 267 K/uL (ref 150–400)
RBC: 3.58 MIL/uL — ABNORMAL LOW (ref 3.87–5.11)
RDW: 12.4 % (ref 11.5–15.5)
WBC: 11.6 K/uL — ABNORMAL HIGH (ref 4.0–10.5)
nRBC: 0 % (ref 0.0–0.2)

## 2024-06-26 LAB — BASIC METABOLIC PANEL WITH GFR
Anion gap: 11 (ref 5–15)
BUN: 9 mg/dL (ref 6–20)
CO2: 22 mmol/L (ref 22–32)
Calcium: 9 mg/dL (ref 8.9–10.3)
Chloride: 101 mmol/L (ref 98–111)
Creatinine, Ser: 0.63 mg/dL (ref 0.44–1.00)
GFR, Estimated: >60 mL/min (ref >60)
Glucose, Bld: 101 mg/dL — ABNORMAL HIGH (ref 70–99)
Potassium: 3.8 mmol/L (ref 3.5–5.1)
Sodium: 134 mmol/L — ABNORMAL LOW (ref 135–145)

## 2024-06-26 MED ORDER — IPRATROPIUM-ALBUTEROL 0.5-2.5 (3) MG/3ML IN SOLN: 3.0000 mL | Freq: Four times a day (QID) | RESPIRATORY_TRACT | Status: DC | PRN

## 2024-06-26 MED ORDER — IPRATROPIUM-ALBUTEROL 0.5-2.5 (3) MG/3ML IN SOLN: 3.0000 mL | Freq: Two times a day (BID) | RESPIRATORY_TRACT | Status: DC

## 2024-06-26 MED ORDER — ALBUTEROL SULFATE (2.5 MG/3ML) 0.083% IN NEBU: 2.5000 mg | INHALATION_SOLUTION | RESPIRATORY_TRACT | 2 refills | Status: AC | PRN

## 2024-06-26 MED ORDER — BUDESONIDE 0.25 MG/2ML IN SUSP: 0.2500 mg | Freq: Two times a day (BID) | RESPIRATORY_TRACT | 12 refills | Status: DC

## 2024-06-26 MED ORDER — METHYLPREDNISOLONE SODIUM SUCC 125 MG IJ SOLR
INTRAMUSCULAR | Status: AC
Start: 2024-06-26 — End: 2024-06-26
  Administered 2024-06-26: 125 mg via INTRAVENOUS
  Filled 2024-06-26: qty 2

## 2024-06-26 NOTE — Progress Notes (Signed)
 Transferred patient to discharge lounge.  Friend is waiting with her.

## 2024-06-26 NOTE — Plan of Care (Signed)

## 2024-06-26 NOTE — Discharge Summary (Signed)
 Physician Discharge Summary   Patient: Whitney Beard MRN: [id number] DOB: 2001/06/10  Admit date:     06/24/2024  Discharge date: 06/26/24  Discharge Physician: Drue ONEIDA Potter   PCP: Gammon, Chrystal, NP   Recommendations at discharge:  Follow-up with your pulmonologist as well as obstetrician  Discharge Diagnoses:  Asthma exacerbation Pregnancy Psychiatric condition:   Hospital Course: Whitney Beard is a 23 y.o. female who is [redacted] weeks pregnant with medical history significant of ADHD, allergic rhinitis, asthma, BPD 1, suicide attempt, OSA, and obesity p/w acute asthma exacerbation. Pt was in her USOH until the evening prior to presentation when she noticed she was SOB, she used her rescue inhaler 3 times that day, which is not normal for her. When she went tried to go to bed that evening, she was unable to get any sleep due to her SOB; infact, she used her inhaler multiples every hour in order to make it to the following morning at 0700 when she called her mother, who activated EMS .  According to patient her nebulizer is broken down.  Respiratory function improved with nebulization on admission and patient is therefore being discharged today.  Nebulizer have been provided and she will follow-up with her pulmonologist as well as obstetrician.  Consultants: None Procedures performed: None Disposition: Home Diet recommendation:  Regular diet DISCHARGE MEDICATION: Allergies as of 06/26/2024       Reactions   Other Shortness Of Breath   Animals such as cats and dogs and pollen - sneezing, asthma trigger         Medication List     TAKE these medications    acetaminophen  500 MG tablet Commonly known as: TYLENOL  Take 1 tablet (500 mg total) by mouth every 6 (six) hours as needed for headache.   aspirin  EC 81 MG tablet Take 1 tablet (81 mg total) by mouth daily. Swallow whole.   Blood Pressure Kit Devi Check BP daily   budesonide  0.25 MG/2ML nebulizer solution Commonly known  as: PULMICORT  Take 2 mLs (0.25 mg total) by nebulization 2 (two) times daily.   busPIRone  10 MG tablet Commonly known as: BUSPAR  Take 1 tablet (10 mg total) by mouth 3 (three) times daily as needed. What changed: when to take this   ondansetron  4 MG tablet Commonly known as: Zofran  Take 1 tablet (4 mg total) by mouth every 8 (eight) hours as needed for nausea or vomiting.   prenatal vitamin w/FE, FA 27-1 MG Tabs tablet Take 1 tablet by mouth daily at 12 noon.   Ventolin  HFA 108 (90 Base) MCG/ACT inhaler Generic drug: albuterol  Inhale 2 puffs into the lungs every 4 (four) hours as needed for wheezing or shortness of breath. What changed: Another medication with the same name was changed. Make sure you understand how and when to take each.   albuterol  (2.5 MG/3ML) 0.083% nebulizer solution Commonly known as: PROVENTIL  Take 3 mLs (2.5 mg total) by nebulization every 4 (four) hours as needed for wheezing or shortness of breath. What changed: See the new instructions.               Durable Medical Equipment  (From admission, onward)           Start     Ordered   06/26/24 1255  For home use only DME Nebulizer machine  Once       Question Answer Comment  Patient needs a nebulizer to treat with the following condition Asthma affecting pregnancy in second trimester  Length of Need Lifetime   Additional equipment included Administration kit      06/26/24 1255            Follow-up Information     Jude Harden GAILS, MD. Call.   Specialty: Pulmonary Disease Contact information: [address] [place] [address] [phone]         Wilmon Penton, NP. Call.   Specialty: Nurse Practitioner Why: Call the office and schedule a hospital follow up in the next 7-10 days. Contact information: [address] [place] [address] [phone]         Care, [facility] Follow up.   Why: [employer] was called to deliver a nebulizer machine to the  hospital bedside before discharging home. Contact information: [address] [place] [address] [phone]                Discharge Exam: Whitney Beard   06/24/24 1300  Weight: (!) 147 kg   General: Alert, oriented x3, resting comfortably in no acute distress Respiratory: Clear to auscultation bilaterally Cardiovascular: Regular rate and rhythm w/o m/r/g Abdomen: Full nondistended back obese CNS: Alert and oriented x 3 moving all extremities.  Condition at discharge: good  The results of significant diagnostics from this hospitalization (including imaging, microbiology, ancillary and laboratory) are listed below for reference.   Imaging Studies: DG Chest Port 1 View Result Date: 06/24/2024 CLINICAL DATA:  shob EXAM: PORTABLE CHEST - 1 VIEW COMPARISON:  05/02/2024 FINDINGS: Lungs are clear. Heart size and mediastinal contours are within normal limits. No effusion. Visualized bones unremarkable. IMPRESSION: No acute cardiopulmonary disease. Electronically Signed   By: JONETTA Faes M.D.   On: 06/24/2024 15:37    Microbiology: Results for orders placed or performed during the hospital encounter of 06/24/24  Resp panel by RT-PCR (RSV, Flu A&B, Covid) Anterior Nasal Swab     Status: None   Collection Time: 06/24/24  9:00 AM   Specimen: Anterior Nasal Swab  Result Value Ref Range Status   SARS Coronavirus 2 by RT PCR NEGATIVE NEGATIVE Final   Influenza A by PCR NEGATIVE NEGATIVE Final   Influenza B by PCR NEGATIVE NEGATIVE Final    Comment: (NOTE) The Xpert Xpress SARS-CoV-2/FLU/RSV plus assay is intended as an aid in the diagnosis of influenza from Nasopharyngeal swab specimens and should not be used as a sole basis for treatment. Nasal washings and aspirates are unacceptable for Xpert Xpress SARS-CoV-2/FLU/RSV testing.  Fact Sheet for Patients: BloggerCourse.com  Fact Sheet for Healthcare  Providers: SeriousBroker.it  This test is not yet approved or cleared by the [place]  FDA and has been authorized for detection and/or diagnosis of SARS-CoV-2 by FDA under an Emergency Use Authorization (EUA). This EUA will remain in effect (meaning this test can be used) for the duration of the COVID-19 declaration under Section 564(b)(1) of the Act, 21 U.S.C. section 360bbb-3(b)(1), unless the authorization is terminated or revoked.     Resp Syncytial Virus by PCR NEGATIVE NEGATIVE Final    Comment: (NOTE) Fact Sheet for Patients: BloggerCourse.com  Fact Sheet for Healthcare Providers: SeriousBroker.it  This test is not yet approved or cleared by the [place]  FDA and has been authorized for detection and/or diagnosis of SARS-CoV-2 by FDA under an Emergency Use Authorization (EUA). This EUA will remain in effect (meaning this test can be used) for the duration of the COVID-19 declaration under Section 564(b)(1) of the Act, 21 U.S.C. section 360bbb-3(b)(1), unless the authorization is terminated or revoked.  Performed at [facility], [address], [place], [place] [address]     Labs: CBC: Recent Labs  Lab 06/24/24 0900 06/24/24 1255 06/25/24 0254 06/26/24 0346  WBC 8.3 8.9 12.4* 11.6*  NEUTROABS 5.4  --   --   --   HGB 11.3* 11.4* 11.5* 11.5*  HCT 33.0* 33.1* 33.0* 33.2*  MCV 93.8 91.7 91.7 92.7  PLT 230 251 273 267   Basic Metabolic Panel: Recent Labs  Lab 06/24/24 0900 06/24/24 1255 06/25/24 0254 06/26/24 0346  NA 133*  --  135 134*  K 3.5  --  4.1 3.8  CL 109  --  104 101  CO2 17*  --  19* 22  GLUCOSE 130*  --  116* 101*  BUN 8  --  9 9  CREATININE 0.51 0.70 0.53 0.63  CALCIUM  7.9*  --  8.9 9.0   Liver Function Tests: No results for input(s): AST, ALT, ALKPHOS, BILITOT, PROT, ALBUMIN in the last 168 hours. CBG: No results for input(s): GLUCAP  in the last 168 hours.  Discharge time spent:  37 minutes.  Signed: Drue ONEIDA Potter, MD [facility] 06/26/2024

## 2024-06-26 NOTE — Progress Notes (Signed)
 No vitals at 4am. Pt is being dc'd this am

## 2024-06-26 NOTE — Progress Notes (Signed)
 Transition of Care [facility]) - Inpatient Brief Assessment   Patient Details  Name: Tanazia Achee MRN: [id number] Date of Birth: 04/19/2001  [facility]) CM/SW Contact:    Rosaline JONELLE Joe, RN Phone Number: 06/26/2024, 1:51 PM   Clinical Narrative: CM met with the patient at the bedside to discuss IP Care management needs for going home.  The patient lives with her family and plans to return home today.  Patient state that her nebulizer machine is broken and needs a new one.  Nebulizer order is active at this time.  Patient does not have a preference for a DME company.  I called [employer] to deliver a nebulizer machine to the hospital room before her family provides transportation to home today.   Transition of Care Asessment: Insurance and Status: (P) Insurance coverage has been reviewed Patient has primary care physician: (P) Yes Home environment has been reviewed: (P) from home with family Prior level of function:: (P) Independent Prior/Current Home Services: (P) No current home services Social Drivers of Health Review: (P) SDOH reviewed needs interventions Readmission risk has been reviewed: (P) Yes Transition of care needs: (P) transition of care needs identified, TOC will continue to follow

## 2024-06-26 NOTE — Plan of Care (Signed)

## 2024-06-26 NOTE — Progress Notes (Signed)
 Discharge instructions completed with pt and copy given.  All questions answered.  Awaiting on ride home.

## 2024-06-30 NOTE — BH Specialist Note (Signed)
 Integrated Behavioral Health via Telemedicine Visit  07/03/2024 Whitney Beard [id number]  Number of Integrated Behavioral Health Clinician visits: 2- Second Visit  Session Start time: 1016   Session End time: 1108  Total time in minutes: 52  Referring Provider: Home Whitney Beard/Family location: [facility]  [facility] Provider location: Whitney Beard Whitney Beard and [facility] Sumit Branham   All persons participating in visit: Whitney Beard Whitney Beard and [facility] Sonjia Wilcoxson   Types of Service: Individual psychotherapy and Video visit  I connected with Whitney Beard and/or Whitney Beard's n/a via  Telephone or Video Enabled Telemedicine Application  (Video is Caregility application) and verified that I am speaking with the correct person using two identifiers. Discussed confidentiality: Yes   I discussed the limitations of telemedicine and the availability of in person appointments.  Discussed there is a possibility of technology failure and discussed alternative modes of communication if that failure occurs.  I discussed that engaging in this telemedicine visit, they consent to the provision of behavioral healthcare and the services will be billed under their insurance.  Whitney Beard and/or legal guardian expressed understanding and consented to Telemedicine visit: Yes   Presenting Concerns: Whitney Beard and/or family reports the following symptoms/concerns: Anxious regarding finding out if she carries albinism gene, and stress regarding looking for an apartment and problems with baby's father; pt is feeling physically improved after nausea has come down, appetite has increased; pt is coping with the support of her family, positive self-talk,  goal of improving health in pregnancy via decreasing all marijuana usage from once weekly to not at all, as well as planning physically active activities.  Duration of problem: Ongoing ; Severity of problem:  moderate  Whitney Beard and/or Family's Strengths/Protective Factors: Social connections, Concrete supports in place (healthy food, safe environments, etc.), Sense of purpose, and Physical Health (exercise, healthy diet, medication compliance, etc.)  Goals Addressed: Whitney Beard will:  Reduce symptoms of: anxiety, depression, mood instability, and stress   Increase knowledge and/or ability of: healthy habits   Demonstrate ability to: Increase healthy adjustment to current life circumstances and Increase motivation to adhere to plan of care  Progress towards Goals: Ongoing    Interventions: Interventions utilized:  Motivational Interviewing and Supportive Reflection Standardized Assessments completed: Not Needed    Whitney Beard and/or Family Response: Whitney Beard agrees with treatment plan.   Clinical Assessment/Diagnosis  Borderline personality disorder (HCC)  Cannabis use disorder, mild, abuse .   Whitney Beard may benefit from establishing care with psychiatry and ongoing therapy .  Plan: Follow up with behavioral health clinician on : Call Shantavia Jha at [phone], as needed. Behavioral recommendations:  -Continue taking prenatal vitamin and Buspar  as prescribed -Continue daily healthy self-care, including healthy meals/sleep,time with supportive people in life, positive self-talk, outdoor time (including upcoming time at [employer] and yoga studio) -Discuss genetic testing concerns at upcoming medical appointment (medical provider has been made aware of your concern) -Continue plan to attend upcoming appointments at [facility] to establish with psychiatry and ongoing therapy Referral(s): Community Mental Health Services (LME/Outside Clinic)  I discussed the assessment and treatment plan with the Whitney Beard and/or parent/guardian. They were provided an opportunity to ask questions and all were answered. They agreed with the plan and demonstrated an understanding of the instructions.   They were advised  to call back or seek an in-person evaluation if the symptoms worsen or if the condition fails to improve as anticipated.  Kahil Agner C Raymie Trani, LCSW  05/22/2024    4:53 PM  Depression screen PHQ 2/9  Decreased Interest 0  Down, Depressed, Hopeless 1  PHQ - 2 Score 1  Altered sleeping 2  Tired, decreased energy 1  Change in appetite 1  Feeling bad or failure about yourself  0  Trouble concentrating 0  Moving slowly or fidgety/restless 0  Suicidal thoughts 0  PHQ-9 Score 5  Difficult doing work/chores Not difficult at all      05/22/2024    4:54 PM  GAD 7 : Generalized Anxiety Score  Nervous, Anxious, on Edge 2  Control/stop worrying 1  Worry too much - different things 2  Trouble relaxing 1  Restless 2  Easily annoyed or irritable 2  Afraid - awful might happen 0  Total GAD 7 Score 10  Anxiety Difficulty Not difficult at all

## 2024-07-03 ENCOUNTER — Ambulatory Visit: Payer: MEDICAID | Admitting: Clinical

## 2024-07-03 DIAGNOSIS — F603 Borderline personality disorder: Secondary | ICD-10-CM

## 2024-07-03 DIAGNOSIS — F1210 Cannabis abuse, uncomplicated: Secondary | ICD-10-CM

## 2024-07-04 LAB — CYTOLOGY - PAP

## 2024-07-05 ENCOUNTER — Ambulatory Visit (INDEPENDENT_AMBULATORY_CARE_PROVIDER_SITE_OTHER): Payer: MEDICAID | Admitting: Family Medicine

## 2024-07-05 ENCOUNTER — Encounter: Payer: Self-pay | Admitting: Family Medicine

## 2024-07-05 VITALS — BP 124/84 | HR 94 | Temp 98.1°F | Ht 66.5 in | Wt 336.0 lb

## 2024-07-05 DIAGNOSIS — J45901 Unspecified asthma with (acute) exacerbation: Secondary | ICD-10-CM

## 2024-07-05 DIAGNOSIS — R87612 Low grade squamous intraepithelial lesion on cytologic smear of cervix (LGSIL): Secondary | ICD-10-CM | POA: Insufficient documentation

## 2024-07-05 DIAGNOSIS — E871 Hypo-osmolality and hyponatremia: Secondary | ICD-10-CM | POA: Diagnosis not present

## 2024-07-05 DIAGNOSIS — D72829 Elevated white blood cell count, unspecified: Secondary | ICD-10-CM | POA: Diagnosis not present

## 2024-07-05 DIAGNOSIS — Z09 Encounter for follow-up examination after completed treatment for conditions other than malignant neoplasm: Secondary | ICD-10-CM

## 2024-07-05 DIAGNOSIS — Z7689 Persons encountering health services in other specified circumstances: Secondary | ICD-10-CM

## 2024-07-05 LAB — CBC WITH DIFFERENTIAL/PLATELET
Basophils Absolute: 0.1 K/uL (ref 0.0–0.1)
Basophils Relative: 1.1 % (ref 0.0–3.0)
Eosinophils Absolute: 0.2 K/uL (ref 0.0–0.7)
Eosinophils Relative: 1.7 % (ref 0.0–5.0)
HCT: 35.6 % — ABNORMAL LOW (ref 36.0–46.0)
Hemoglobin: 12.1 g/dL (ref 12.0–15.0)
Lymphocytes Relative: 27 % (ref 12.0–46.0)
Lymphs Abs: 2.6 K/uL (ref 0.7–4.0)
MCHC: 33.9 g/dL (ref 30.0–36.0)
MCV: 94.5 fl (ref 78.0–100.0)
Monocytes Absolute: 0.5 K/uL (ref 0.1–1.0)
Monocytes Relative: 5.5 % (ref 3.0–12.0)
Neutro Abs: 6.3 K/uL (ref 1.4–7.7)
Neutrophils Relative %: 64.7 % (ref 43.0–77.0)
Platelets: 297 K/uL (ref 150.0–400.0)
RBC: 3.76 Mil/uL — ABNORMAL LOW (ref 3.87–5.11)
RDW: 13.3 % (ref 11.5–15.5)
WBC: 9.7 K/uL (ref 4.0–10.5)

## 2024-07-05 LAB — COMPREHENSIVE METABOLIC PANEL WITH GFR
ALT: 10 U/L (ref 0–35)
AST: 10 U/L (ref 0–37)
Albumin: 3.6 g/dL (ref 3.5–5.2)
Alkaline Phosphatase: 56 U/L (ref 39–117)
BUN: 6 mg/dL (ref 6–23)
CO2: 25 meq/L (ref 19–32)
Calcium: 8.6 mg/dL (ref 8.4–10.5)
Chloride: 101 meq/L (ref 96–112)
Creatinine, Ser: 0.53 mg/dL (ref 0.40–1.20)
GFR: 129.79 mL/min (ref >60.00)
Glucose, Bld: 84 mg/dL (ref 70–99)
Potassium: 3.9 meq/L (ref 3.5–5.1)
Sodium: 132 meq/L — ABNORMAL LOW (ref 135–145)
Total Bilirubin: 0.4 mg/dL (ref 0.2–1.2)
Total Protein: 7 g/dL (ref 6.0–8.3)

## 2024-07-05 NOTE — Patient Instructions (Addendum)
-  It was nice to meet you and look forward to taking care of you. -Please call pulmonary as soon as possible to schedule an appointment.  Dr. Harden GAILS. [facility] Specialty: Pulmonary Disease [address] [place] [address] [phone] -Ordered labs. Office will call with lab results and will be available on MyChart.  -Continue with care through OBGYN for pregnancy and [facility].  -Follow up in 1 year for a physical.

## 2024-07-05 NOTE — Progress Notes (Signed)
 New Patient Office Visit  Subjective   Patient ID: Whitney Beard, female    DOB: 12-26-2000  Age: 23 y.o. MRN: [id number]  CC:  Chief Complaint  Patient presents with   Establish Care    HPI Whitney Beard presents to establish care with new provider.   Patients previous primary care provider: None, last was in high school.   Specialist: [facility] for [facility] at [facility] with Dr. Ronal Pinal  -Upcoming appointment with [facility] in September (psych and counseling)   Patient was admitted at [facility] on 07/26 through 07/28 for asthma exacerbation, pregnancy, and psychiatric condition. She presented with [facility], she used her rescue inhaler more than her normal. Respiratory function improved with nebulization ob admission and patient discharged with nebulizer, to follow up with pulmonologist and OB.  Patient has not made a pulmonary appointment, but scheduled for OB later this month.   Outpatient Encounter Medications as of 07/05/2024  Medication Sig   acetaminophen  (TYLENOL ) 500 MG tablet Take 1 tablet (500 mg total) by mouth every 6 (six) hours as needed for headache.   albuterol  (PROVENTIL ) (2.5 MG/3ML) 0.083% nebulizer solution Take 3 mLs (2.5 mg total) by nebulization every 4 (four) hours as needed for wheezing or shortness of breath.   albuterol  (VENTOLIN  HFA) 108 (90 Base) MCG/ACT inhaler Inhale 2 puffs into the lungs every 4 (four) hours as needed for wheezing or shortness of breath.   aspirin  EC 81 MG tablet Take 1 tablet (81 mg total) by mouth daily. Swallow whole.   Blood Pressure Monitoring (BLOOD PRESSURE KIT) DEVI Check BP daily   budesonide  (PULMICORT ) 0.25 MG/2ML nebulizer solution Take 2 mLs (0.25 mg total) by nebulization 2 (two) times daily.   ondansetron  (ZOFRAN ) 4 MG tablet Take 1 tablet (4 mg total) by mouth every 8 (eight) hours as needed for nausea or vomiting.   prenatal vitamin w/FE, FA (PRENATAL 1  + 1) 27-1 MG TABS tablet Take 1 tablet by mouth daily at 12 noon.   busPIRone  (BUSPAR ) 10 MG tablet Take 1 tablet (10 mg total) by mouth 3 (three) times daily as needed. (Patient not taking: Reported on 07/05/2024)   No facility-administered encounter medications on file as of 07/05/2024.    Past Medical History:  Diagnosis Date   ADHD (attention deficit hyperactivity disorder)    Allergic rhinitis    Asthma    BMI 45.0-49.9, adult (HCC)    Borderline personality disorder (HCC)    Migraines    Obesity    Prediabetes    states today 11/11/20 was told in the past that she was pre- diabetic   Sleep apnea    Suicide attempt [facility])     Past Surgical History:  Procedure Laterality Date   ADENOIDECTOMY     TONSILLECTOMY      Family History  Problem Relation Age of Onset   Bipolar disorder Father    Schizophrenia Father    Liver disease Father    Asthma Father    Asthma Maternal Grandmother    Schizophrenia Maternal Grandmother    SIDS Maternal Aunt    Heart disease Paternal Grandmother    Diabetes Paternal Grandmother    Seizures Paternal Grandmother     Social History   Socioeconomic History   Marital status: Single    Spouse name: Not on file   Number of children: 0   Years of education: Not on file   Highest education level: High  school graduate  Occupational History   Not on file  Tobacco Use   Smoking status: Former    Current packs/day: 0.50    Types: Cigarettes   Smokeless tobacco: Never   Tobacco comments:    vapes   Vaping Use   Vaping status: Former  Substance and Sexual Activity   Alcohol use: Not Currently   Drug use: Not Currently   Sexual activity: Yes    Birth control/protection: None  Other Topics Concern   Not on file  Social History Narrative   Lives with mom.    Social Drivers of Health   Financial Resource Strain: Medium Risk (05/22/2024)   Overall Financial Resource Strain (CARDIA)    Difficulty of Paying Living Expenses: Somewhat  hard  Food Insecurity: No Food Insecurity (06/24/2024)   Hunger Vital Sign    Worried About Running Out of Food in the Last Year: Never true    Ran Out of Food in the Last Year: Never true  Recent Concern: Food Insecurity - Food Insecurity Present (05/22/2024)   Hunger Vital Sign    Worried About Running Out of Food in the Last Year: Sometimes true    Ran Out of Food in the Last Year: Sometimes true  Transportation Needs: No Transportation Needs (06/24/2024)   PRAPARE - Administrator, Civil Service (Medical): No    Lack of Transportation (Non-Medical): No  Physical Activity: Insufficiently Active (05/22/2024)   Exercise Vital Sign    Days of Exercise per Week: 3 days    Minutes of Exercise per Session: 30 min  Stress: Stress Concern Present (05/22/2024)   [employer] of Occupational Health - Occupational Stress Questionnaire    Feeling of Stress: To some extent  Social Connections: Moderately Isolated (05/22/2024)   Social Connection and Isolation Panel    Frequency of Communication with Friends and Family: Three times a week    Frequency of Social Gatherings with Friends and Family: More than three times a week    Attends Religious Services: 1 to 4 times per year    Active Member of [employer] or Organizations: No    Attends Banker Meetings: Never    Marital Status: Never married  Intimate Partner Violence: Not At Risk (06/24/2024)   Humiliation, Afraid, Rape, and Kick questionnaire    Fear of Current or Ex-Partner: No    Emotionally Abused: No    Physically Abused: No    Sexually Abused: No    ROS See HPI above    Objective  BP 124/84   Pulse 94   Temp 98.1 F (36.7 C) (Oral)   Ht 5' 6.5 (1.689 m)   Wt (!) 336 lb (152.4 kg)   LMP 02/25/2024 (Approximate)   SpO2 98%   BMI 53.42 kg/m   Physical Exam Vitals reviewed.  Constitutional:      General: She is not in acute distress.    Appearance: Normal appearance. She is obese. She is not  ill-appearing, toxic-appearing or diaphoretic.  HENT:     Head: Normocephalic and atraumatic.  Eyes:     General:        Right eye: No discharge.        Left eye: No discharge.     Conjunctiva/sclera: Conjunctivae normal.  Cardiovascular:     Rate and Rhythm: Normal rate and regular rhythm.     Heart sounds: Normal heart sounds. No murmur heard.    No friction rub. No gallop.  Pulmonary:  Effort: Pulmonary effort is normal. No respiratory distress.     Breath sounds: Wheezing (Mild) present.  Musculoskeletal:        General: Normal range of motion.  Skin:    General: Skin is warm and dry.  Neurological:     General: No focal deficit present.     Mental Status: She is alert and oriented to person, place, and time. Mental status is at baseline.  Psychiatric:        Mood and Affect: Mood normal.        Behavior: Behavior normal.        Thought Content: Thought content normal.        Judgment: Judgment normal.      Assessment & Plan:  Hospital discharge follow-up -     CBC with Differential/Platelet -     Comprehensive metabolic panel with GFR  Severe asthma with exacerbation, unspecified whether persistent  Hyponatremia -     Comprehensive metabolic panel with GFR  Leukocytosis, unspecified type -     CBC with Differential/Platelet  Encounter to establish care  1.Review health maintenance:  -Covid booster: Recommend to obtain at local pharmacy. -Tdap: Will obtain through GYN  -HPV vaccine: Will obtain through GYN  -Influenza vaccine: May obtain once vaccine is available. 2.Recommend for patient to make a follow up appointment with pulmonary as soon as possible. I 3.Ordered CBC and CMP since she had some abnormalities.  4. Recommend to continue care with OBGYN for pregnancy and [facility].   Return in about 1 year (around 07/05/2025) for physical.   Kendra Grissett, NP

## 2024-07-06 ENCOUNTER — Ambulatory Visit: Payer: Self-pay | Admitting: Family Medicine

## 2024-07-18 ENCOUNTER — Encounter (HOSPITAL_BASED_OUTPATIENT_CLINIC_OR_DEPARTMENT_OTHER): Payer: Self-pay

## 2024-07-18 ENCOUNTER — Ambulatory Visit (INDEPENDENT_AMBULATORY_CARE_PROVIDER_SITE_OTHER): Payer: MEDICAID | Admitting: Certified Nurse Midwife

## 2024-07-18 ENCOUNTER — Other Ambulatory Visit (HOSPITAL_BASED_OUTPATIENT_CLINIC_OR_DEPARTMENT_OTHER)

## 2024-07-18 VITALS — BP 114/63 | HR 79 | Wt 338.8 lb

## 2024-07-18 DIAGNOSIS — F603 Borderline personality disorder: Secondary | ICD-10-CM

## 2024-07-18 DIAGNOSIS — O99342 Other mental disorders complicating pregnancy, second trimester: Secondary | ICD-10-CM | POA: Diagnosis not present

## 2024-07-18 DIAGNOSIS — Z3A19 19 weeks gestation of pregnancy: Secondary | ICD-10-CM

## 2024-07-18 DIAGNOSIS — O99512 Diseases of the respiratory system complicating pregnancy, second trimester: Secondary | ICD-10-CM

## 2024-07-18 DIAGNOSIS — O99212 Obesity complicating pregnancy, second trimester: Secondary | ICD-10-CM

## 2024-07-18 DIAGNOSIS — J45909 Unspecified asthma, uncomplicated: Secondary | ICD-10-CM

## 2024-07-18 DIAGNOSIS — Z3402 Encounter for supervision of normal first pregnancy, second trimester: Secondary | ICD-10-CM

## 2024-07-18 NOTE — Progress Notes (Signed)
 PRENATAL VISIT NOTE  Subjective:  Whitney Beard is a 23 y.o. G1P0 at [redacted]w[redacted]d being seen today for ongoing prenatal care.  She is currently monitored for the following issues for this  pregnancy and has OSA (obstructive sleep apnea); Class 3 severe obesity due to excess calories with serious comorbidity and body mass index (BMI) of 50.0 to 59.9 in adult; Major depressive disorder; Severe bipolar I disorder, current or most recent episode depressed (HCC); Anxiety disorder, unspecified; Cannabis use disorder, mild, abuse; Borderline personality disorder (HCC); Severe persistent allergic asthma; Encounter for supervision of normal first pregnancy in second trimester; Maternal morbid obesity, antepartum (HCC); Asthma during pregnancy; H/O suicide attempt; History of prediabetes; Asthma exacerbation; and LGSIL on Pap smear of cervix on their problem list.  Patient reports no bleeding, no contractions, no cramping, and no leaking.  Contractions: Not present. Vag. Bleeding: None.  Movement: Present. Denies leaking of fluid.   The following portions of the patient's history were reviewed and updated as appropriate: allergies, current medications, past family history, past medical history, past social history, past surgical history and problem list.   Objective:    Vitals:   07/18/24 0843  BP: 114/63  Pulse: 79  Weight: (!) 338 lb 12.8 oz (153.7 kg)    Fetal Status:      Movement: Present    General: Alert, oriented and cooperative. Patient is in no acute distress.  Skin: Skin is warm and dry. No rash noted.   Cardiovascular: Normal heart rate noted  Respiratory: Normal respiratory effort, no problems with respiration noted  Abdomen: Soft, gravid, appropriate for gestational age.  Pain/Pressure: Present (lower abdominal pain)     Pelvic: Cervical exam deferred        Extremities: Normal range of motion.  Edema: None  Mental Status: Normal mood and affect. Normal behavior. Normal judgment and  thought content.   Assessment and Plan:  Pregnancy: G1P0 at [redacted]w[redacted]d  1. Encounter for supervision of normal first pregnancy in first trimester - Continue Prenatal Vitamin and ASA 81mg  daily - Panorama Low-Risk, Female - Anatomy US  Scheduled - Healthy diet and exercise encouraged   2. [redacted] weeks gestation of pregnancy   3. Maternal morbid obesity, antepartum (HCC) (Primary) - Most recent HgA1C 05/22/24  5.0 (Normal) - Baseline PreE labs completed - Thyroid  Panel With TSH (within normal ranges)   4. Asthma during pregnancy - Pt has current unexpired inhaler - Pt evaluated in ED 06/24/24 due to Asthma exacerbation - Need to establish care with Adult Pulmonology (also has Hx Sleep Apnea and Persistent Asthma). Referral to Pulmonology placed.    5. Anxiety, Borderline Personality disorder - Pt states prior SSRI therapy not effective (Zoloft /Lexapro /Prozac ). She is willing to try Buspar  TID prn. Knows that counseling is available. - Has good support system. - Referral placed to [facility]. A- Had appt with [facility] (06/15/24). Next appt scheduled.   Preterm labor symptoms and general obstetric precautions including but not limited to vaginal bleeding, contractions, leaking of fluid and fetal movement were reviewed in detail with the patient. Please refer to After Visit Summary for other counseling recommendations.   No follow-ups on file.  Future Appointments  Date Time Provider Department Center  08/01/2024 10:00 AM [facility] PROVIDER 1 [facility]  08/01/2024 10:30 AM [facility] US3 [facility]  08/09/2024 10:00 AM Billy Philippe SAUNDERS, NP [facility]  08/14/2024  1:00 PM Mercy Lauraine LABOR [facility] None  08/21/2024  1:35 PM Cleotilde Ronal RAMAN, MD [facility]  [facility]  08/29/2024  1:00 PM Cozart, Carlyon GRADE, LCSW [facility] None  09/11/2024  8:00 AM [facility] OBGYN LAB [facility]  09/11/2024  9:15 AM Cleotilde Ronal RAMAN, MD [facility]    Arland MARLA Roller, CNM

## 2024-07-24 ENCOUNTER — Encounter (HOSPITAL_BASED_OUTPATIENT_CLINIC_OR_DEPARTMENT_OTHER): Admitting: Obstetrics & Gynecology

## 2024-07-25 NOTE — Telephone Encounter (Signed)
 Patient has been scheduled. NFN

## 2024-08-01 ENCOUNTER — Other Ambulatory Visit: Payer: Self-pay | Admitting: *Deleted

## 2024-08-01 ENCOUNTER — Ambulatory Visit (HOSPITAL_BASED_OUTPATIENT_CLINIC_OR_DEPARTMENT_OTHER): Payer: MEDICAID

## 2024-08-01 ENCOUNTER — Ambulatory Visit: Payer: MEDICAID | Attending: Certified Nurse Midwife | Admitting: Obstetrics

## 2024-08-01 VITALS — BP 141/80

## 2024-08-01 DIAGNOSIS — Z8489 Family history of other specified conditions: Secondary | ICD-10-CM | POA: Insufficient documentation

## 2024-08-01 DIAGNOSIS — Z3401 Encounter for supervision of normal first pregnancy, first trimester: Secondary | ICD-10-CM

## 2024-08-01 DIAGNOSIS — Z3A21 21 weeks gestation of pregnancy: Secondary | ICD-10-CM | POA: Insufficient documentation

## 2024-08-01 DIAGNOSIS — J45909 Unspecified asthma, uncomplicated: Secondary | ICD-10-CM

## 2024-08-01 DIAGNOSIS — Z8349 Family history of other endocrine, nutritional and metabolic diseases: Secondary | ICD-10-CM | POA: Insufficient documentation

## 2024-08-01 DIAGNOSIS — O99213 Obesity complicating pregnancy, third trimester: Secondary | ICD-10-CM

## 2024-08-01 DIAGNOSIS — E669 Obesity, unspecified: Secondary | ICD-10-CM | POA: Diagnosis not present

## 2024-08-01 DIAGNOSIS — Z315 Encounter for genetic counseling: Secondary | ICD-10-CM | POA: Insufficient documentation

## 2024-08-01 DIAGNOSIS — O99212 Obesity complicating pregnancy, second trimester: Secondary | ICD-10-CM

## 2024-08-01 DIAGNOSIS — O99512 Diseases of the respiratory system complicating pregnancy, second trimester: Secondary | ICD-10-CM | POA: Insufficient documentation

## 2024-08-01 DIAGNOSIS — O99210 Obesity complicating pregnancy, unspecified trimester: Secondary | ICD-10-CM | POA: Diagnosis present

## 2024-08-01 NOTE — Progress Notes (Signed)
 MFM Consult Note  Whitney Beard is currently at 21 weeks and 4 days.  Whitney Beard was seen due to maternal obesity with a BMI of 51.1.   Whitney Beard denies any significant past medical history and denies any problems in her current pregnancy.    Whitney Beard had a cell free DNA test drawn earlier in her pregnancy which indicated a low risk for trisomy 70, 75, and 13.  A female fetus is predicted.  The patient reports that one of her siblings have children who were diagnosed with albinism.  Sonographic findings Single intrauterine pregnancy at 21w 4d  Fetal cardiac activity:  Observed and appears normal. Presentation: Cephalic. The anatomic structures that were well seen appear normal without evidence of soft markers. Due to poor acoustic windows some structures remain suboptimally visualized. Fetal biometry shows the estimated fetal weight at the 53 percentile.  Amniotic fluid: Within normal limits.  MVP: 3.91 cm. Placenta: Posterior. Adnexa: No abnormality visualized. Cervical length: 3.3 cm.  There are limitations of prenatal ultrasound such as the inability to detect certain abnormalities due to poor visualization. Various factors such as fetal position, gestational age and maternal body habitus may increase the difficulty in visualizing the fetal anatomy.    The patient met with our genetic counselor regarding the family history of albinism.  Whitney Beard will consider further testing during her future visits.  Due to maternal obesity, Whitney Beard should continue taking a daily baby aspirin  for preeclampsia prophylaxis.    We will continue to follow her with monthly growth ultrasounds.    As her BMI is 51, we will start weekly fetal testing at 34 weeks.    A follow-up exam was scheduled in 4 weeks.    The patient stated that all of her questions were answered today.  A total of 30 minutes was spent counseling and coordinating the care for this patient.  Greater than 50% of the time was spent in direct face-to-face  contact.

## 2024-08-01 NOTE — Progress Notes (Signed)
 [facility] at [facility] [address], [address] Phone:  [phone]   Fax:  [phone]      In-Person Genetic Counseling Clinic Note:   I spoke with 23 y.o. Whitney Beard today to discuss her family history of albinism. She was referred by Tad Arland POUR, CNM.   Pregnancy History:    G1P0. EGA: [redacted]w[redacted]d by US . EDD: 12/08/2024. Personal history of asthma and mental health conditions. Currently sees a therapist and is happy with her progress. Denies major personal health concerns. Denies bleeding, infections, and fevers in this pregnancy. Denies using tobacco, alcohol, or street drugs in this pregnancy.   Family History:    A three-generation pedigree was created and scanned into Epic under the Media tab.  Patient reports that her sister has a 75 yo niece with albinism and a 1 mo niece with possible albinism. Skin and eye pigmentation is affected. Her 2 yo niece has nystagmus and vision concerns. She also is reported to bruise easily. Her 1 mo niece has not received a formal diagnosis yet. No known genetic testing was performed and the type of albinism is not known. Family history is negative for albinism.  We reviewed oculocutaneous albinism (OCA) is inherited in an autosomal recessive pattern. There are syndromic and non-syndromic forms of OCA. Syndromic examples include Hermansky-Pudlak syndrome and Chediak-Higashi syndrome. Syndromic forms affect pigmentation in the skin and eyes but can also impact other organ systems. To date, seven types of nonsyndromic albinism (OCA1 to [facility]) have been described. These various forms are caused by pathogenic variants in various genes. Ocular albinism primarily affects the eyes and is inherited in an X-linked fashion. We reviewed that based on the reported family history, we suspect the patient's sister and her reproductive partner are carriers for OCA. Therefore, we can expect Lai has a 50% chance of also  being a carrier. We reviewed if both she and FOB are carriers, each of their pregnancies would have a 25% chance of being affected. We discussed the most informative individuals to test would be her nieces, as targeted variant genetic testing can be performed if a familial variant is known. However, since genetic testing reports on the affected family members is not available, we offered Whitney Beard to be screened for genes associated with syndromic and nonsyndromic albinism that are available on the current carrier screening panels. We discussed that a negative result for Whitney Beard is not entirely informative and cannot rule out the possibility of any of her pregnancies having OCA as the genetic cause of her niece's OCA is unknown. We also offered expanded carrier screening and discussed the benefits, risks, and limitations. We reviewed if both she and FOB are carriers for the same condition, prenatal diagnostic testing would be available. The benefits, risks, and limitations, including the 1 in 500 risk for miscarriage were reviewed.  Patient reports her maternal half sister has a 16 yo nephew diagnosed with autism, no genetic testing performed. We discussed that we are unable to directly test for autism in a pregnancy. Genetic testing for individuals with a clinical diagnosis of autism yields an explanation in only about 20% of cases, and the remaining 80% of cases are left with unknown etiology. Having an affected family member may increase the chance that Whitney Beard's children will have autism; however, without genetic testing performed on affected family members, it is difficult to assess risk to the pregnancy and other family members. The risk may be up  to 50% in the case of an identified genetic cause. We also discussed and offered screening for fragile X syndrome, which is one of the most common causes of inherited intellectual disability and autism in males. Fragile X syndrome affects females as well. She declined  carrier screening today but is considering her options.  Patient reports a maternal half sister who was born with a cleft palate. No other information is known. We discussed cleft palate can be genetic or multifactorial. Without known medical reports, recurrence risk is difficult to estimate.  Whitney Beard is encouraged to reach out with any clarifying information if available.  Maternal ethnicity reported as Black/Middle [place] and paternal ethnicity reported as [place]. Denies Ashkenazi Jewish ancestry.  Family history not remarkable for consanguinity, individuals with birth defects, intellectual disability, autism spectrum disorder, multiple spontaneous abortions, still births, or unexplained neonatal death.   Newborn Screening. The [place]  Newborn Screening (NBS) program will screen all newborn babies for cystic fibrosis, spinal muscular atrophy, hemoglobinopathies, and numerous other conditions.  Previous Testing Completed:  Low risk NIPS: Whitney Beard previously completed noninvasive prenatal screening (NIPS) in this pregnancy. The result is low risk, consistent with a female fetus. This screening significantly reduces but does not eliminate the chance that the current pregnancy has Down syndrome (trisomy 35), trisomy 69, trisomy 32, and common sex chromosome conditions. Please see report for details. There are many genetic conditions that cannot be detected by NIPS.   Negative carrier screening: Whitney Beard previously completed carrier screening. She screened to not be a carrier for cystic fibrosis (CF), spinal muscular atrophy (SMA), alpha thalassemia, and beta hemoglobinopathies. Please see report for details. A negative result on carrier screening reduces but does not eliminate the chance of being a carrier.    Plan of Care:   [name] is considering carrier screening for oculocutaneous albinism vs expanded carrier screening. She declined carrier screening for fragile X syndrome at  this moment. She will inform us  if she wishes to proceed with carrier screening. Routine prenatal care.   Informed consent was obtained. All questions were answered.   60 minutes were spent on the date of the encounter in service to the patient including preparation, face-to-face consultation, discussion of test reports and available next steps, pedigree construction, genetic risk assessment, documentation, and care coordination.    Thank you for sharing in the care of Whitney Beard with us .  Please do not hesitate to contact us  at [phone] if you have any questions.   Lauraine Bodily, MS, [facility] Certified Genetic Counselor   Genetic counseling student involved in appointment: No.

## 2024-08-02 ENCOUNTER — Encounter: Admitting: Family Medicine

## 2024-08-08 ENCOUNTER — Ambulatory Visit (INDEPENDENT_AMBULATORY_CARE_PROVIDER_SITE_OTHER): Payer: MEDICAID | Admitting: Pulmonary Disease

## 2024-08-08 VITALS — BP 115/76 | HR 97 | Ht 66.0 in | Wt 344.0 lb

## 2024-08-08 DIAGNOSIS — R0683 Snoring: Secondary | ICD-10-CM | POA: Diagnosis not present

## 2024-08-08 DIAGNOSIS — G4709 Other insomnia: Secondary | ICD-10-CM

## 2024-08-08 NOTE — Progress Notes (Signed)
 Whitney Beard    [id number]    2001/04/06  Primary Care Physician:Williamson, Philippe SAUNDERS, NP  Referring Physician: Tad Arland POUR, CNM [id number] [place],  [place] [address]  Chief complaint:   Patient being seen with concerns regarding her breathing  HPI:  Patient is 5 months pregnant Difficulty sleeping Difficulty maintaining sleep  Has had a long-term history of insomnia difficulty going to bed and multiple awakenings Usually goes to bed between 10 and 11 PM Sometimes takes up to 3 hours to fall asleep 5 or more awakenings Final wake up time about 6 AM  Concerns during multiple evaluations previously with getting her sleep  Was seen recently in the hospital with concerns for sleep disordered breathing, recommended sleep study  Admits to occasional sweating at night No dryness of the mouth in the morning No morning headaches She is occasionally sleepy Elokda depending on how much sleep she got the previous night  History of asthma History of bipolar disorder, major depression, anxiety, borderline personality disorder  Never smoker, no history of significant alcohol use  Outpatient Encounter Medications as of 08/08/2024  Medication Sig   acetaminophen  (TYLENOL ) 500 MG tablet Take 1 tablet (500 mg total) by mouth every 6 (six) hours as needed for headache.   albuterol  (PROVENTIL ) (2.5 MG/3ML) 0.083% nebulizer solution Take 3 mLs (2.5 mg total) by nebulization every 4 (four) hours as needed for wheezing or shortness of breath.   albuterol  (VENTOLIN  HFA) 108 (90 Base) MCG/ACT inhaler Inhale 2 puffs into the lungs every 4 (four) hours as needed for wheezing or shortness of breath.   aspirin  EC 81 MG tablet Take 1 tablet (81 mg total) by mouth daily. Swallow whole.   Blood Pressure Monitoring (BLOOD PRESSURE KIT) DEVI Check BP daily   budesonide  (PULMICORT ) 0.25 MG/2ML nebulizer solution Take 2 mLs (0.25 mg total) by nebulization 2 (two) times daily.    busPIRone  (BUSPAR ) 10 MG tablet Take 1 tablet (10 mg total) by mouth 3 (three) times daily as needed.   ondansetron  (ZOFRAN ) 4 MG tablet Take 1 tablet (4 mg total) by mouth every 8 (eight) hours as needed for nausea or vomiting.   prenatal vitamin w/FE, FA (PRENATAL 1 + 1) 27-1 MG TABS tablet Take 1 tablet by mouth daily at 12 noon.   No facility-administered encounter medications on file as of 08/08/2024.    Allergies as of 08/08/2024 - Review Complete 08/08/2024  Allergen Reaction Noted   Other Shortness Of Breath 05/03/2020    Past Medical History:  Diagnosis Date   ADHD (attention deficit hyperactivity disorder)    Allergic rhinitis    Asthma    BMI 45.0-49.9, adult (HCC)    Borderline personality disorder (HCC)    Migraines    Obesity    Prediabetes    states today 11/11/20 was told in the past that she was pre- diabetic   Sleep apnea    Suicide attempt [facility])     Past Surgical History:  Procedure Laterality Date   ADENOIDECTOMY     TONSILLECTOMY     WISDOM TOOTH EXTRACTION Bilateral     Family History  Problem Relation Age of Onset   Bipolar disorder Father    Schizophrenia Father    Liver disease Father    Asthma Father    Asthma Maternal Grandmother    Schizophrenia Maternal Grandmother    SIDS Maternal Aunt    Heart disease Paternal Grandmother    Diabetes  Paternal Grandmother    Seizures Paternal Grandmother     Social History   Socioeconomic History   Marital status: Single    Spouse name: Not on file   Number of children: 0   Years of education: Not on file   Highest education level: High school graduate  Occupational History   Not on file  Tobacco Use   Smoking status: Former    Current packs/day: 0.50    Types: Cigarettes   Smokeless tobacco: Never   Tobacco comments:    vapes   Vaping Use   Vaping status: Former  Substance and Sexual Activity   Alcohol use: Not Currently   Drug use: Not Currently   Sexual activity: Yes    Birth  control/protection: None  Other Topics Concern   Not on file  Social History Narrative   Lives with mom.    Social Drivers of Health   Financial Resource Strain: Medium Risk (05/22/2024)   Overall Financial Resource Strain (CARDIA)    Difficulty of Paying Living Expenses: Somewhat hard  Food Insecurity: No Food Insecurity (06/24/2024)   Hunger Vital Sign    Worried About Running Out of Food in the Last Year: Never true    Ran Out of Food in the Last Year: Never true  Recent Concern: Food Insecurity - Food Insecurity Present (05/22/2024)   Hunger Vital Sign    Worried About Running Out of Food in the Last Year: Sometimes true    Ran Out of Food in the Last Year: Sometimes true  Transportation Needs: No Transportation Needs (06/24/2024)   PRAPARE - Administrator, Civil Service (Medical): No    Lack of Transportation (Non-Medical): No  Physical Activity: Insufficiently Active (05/22/2024)   Exercise Vital Sign    Days of Exercise per Week: 3 days    Minutes of Exercise per Session: 30 min  Stress: Stress Concern Present (05/22/2024)   [employer] of Occupational Health - Occupational Stress Questionnaire    Feeling of Stress: To some extent  Social Connections: Moderately Isolated (05/22/2024)   Social Connection and Isolation Panel    Frequency of Communication with Friends and Family: Three times a week    Frequency of Social Gatherings with Friends and Family: More than three times a week    Attends Religious Services: 1 to 4 times per year    Active Member of [employer] or Organizations: No    Attends Banker Meetings: Never    Marital Status: Never married  Intimate Partner Violence: Not At Risk (06/24/2024)   Humiliation, Afraid, Rape, and Kick questionnaire    Fear of Current or Ex-Partner: No    Emotionally Abused: No    Physically Abused: No    Sexually Abused: No    Review of Systems  Psychiatric/Behavioral:  Positive for sleep disturbance.      Vitals:   08/08/24 1028  BP: 115/76  Pulse: 97  SpO2: 99%     Physical Exam Constitutional:      Appearance: She is obese.  HENT:     Head: Normocephalic.     Nose: Nose normal.     Mouth/Throat:     Mouth: Mucous membranes are moist.  Eyes:     General: No scleral icterus. Cardiovascular:     Rate and Rhythm: Normal rate and regular rhythm.     Heart sounds: No murmur heard.    No friction rub.  Pulmonary:     Effort: No respiratory distress.  Breath sounds: No stridor. No wheezing or rhonchi.  Musculoskeletal:     Cervical back: No rigidity or tenderness.  Neurological:     Mental Status: She is alert.  Psychiatric:        Mood and Affect: Mood normal.       08/08/2024   10:00 AM  Results of the Epworth flowsheet  Sitting and reading 0  Watching TV 1  Sitting, inactive in a public place (e.g. a theatre or a meeting) 0  As a passenger in a car for an hour without a break 0  Lying down to rest in the afternoon when circumstances permit 3  Sitting and talking to someone 0  Sitting quietly after a lunch without alcohol 0  In a car, while stopped for a few minutes in traffic 0  Total score 4     Data Reviewed: She had a previous sleep study documented from previous, negative for for sleep disordered breathing-could not find the actual study  Assessment/Plan:  Asthma - Use albuterol  as needed - Let us  know if you need to use a any other inhaler - Has not been having any significant issues with asthma lately  Concern for sleep disordered breathing is long-term - Will benefit from having a sleep study - Although, an in-lab study is the gold standard, more so during pregnancy - Home sleep study may be most convenient for the patient  Encouraged to continue to stay active  Insomnia in pregnancy may be managed with doxylamine as needed  Encouraged to call with significant concerns  Will go ahead and schedule a home sleep study  Encouraged to call  with significant concerns Jennet Epley MD [facility] 08/08/2024, 10:37 AM  CC: Tad Arland POUR, CNM

## 2024-08-08 NOTE — Patient Instructions (Addendum)
  Continue using albuterol  as needed  Optimize sleep hygiene as able -The only medication that pregnant ladies can use safely will be doxylamine -Nonmedication optimization is best  Schedule for a home sleep test - If your home sleep test is negative and we are still concerned about your sleep, we may need to order a study where you stay overnight in the sleep lab  Continue graded activities  Call us  with significant concerns  Make sure you are keeping your asthma as controlled as possible

## 2024-08-09 ENCOUNTER — Ambulatory Visit (INDEPENDENT_AMBULATORY_CARE_PROVIDER_SITE_OTHER): Payer: MEDICAID | Admitting: Family Medicine

## 2024-08-09 VITALS — BP 122/80 | HR 100 | Temp 98.5°F | Ht 66.0 in | Wt 345.0 lb

## 2024-08-09 DIAGNOSIS — Z111 Encounter for screening for respiratory tuberculosis: Secondary | ICD-10-CM

## 2024-08-09 DIAGNOSIS — Z6841 Body Mass Index (BMI) 40.0 and over, adult: Secondary | ICD-10-CM

## 2024-08-09 DIAGNOSIS — Z23 Encounter for immunization: Secondary | ICD-10-CM

## 2024-08-09 DIAGNOSIS — Z0000 Encounter for general adult medical examination without abnormal findings: Secondary | ICD-10-CM | POA: Diagnosis not present

## 2024-08-09 DIAGNOSIS — Z862 Personal history of diseases of the blood and blood-forming organs and certain disorders involving the immune mechanism: Secondary | ICD-10-CM | POA: Diagnosis not present

## 2024-08-09 DIAGNOSIS — E66813 Obesity, class 3: Secondary | ICD-10-CM

## 2024-08-09 LAB — LIPID PANEL
Cholesterol: 173 mg/dL (ref 0–200)
HDL: 62.3 mg/dL (ref >39.00)
LDL Cholesterol: 82 mg/dL (ref 0–99)
NonHDL: 110.46
Total CHOL/HDL Ratio: 3
Triglycerides: 140 mg/dL (ref 0.0–149.0)
VLDL: 28 mg/dL (ref 0.0–40.0)

## 2024-08-09 LAB — CBC WITH DIFFERENTIAL/PLATELET
Basophils Absolute: 0 K/uL (ref 0.0–0.1)
Basophils Relative: 0.6 % (ref 0.0–3.0)
Eosinophils Absolute: 0.2 K/uL (ref 0.0–0.7)
Eosinophils Relative: 2.3 % (ref 0.0–5.0)
HCT: 33.9 % — ABNORMAL LOW (ref 36.0–46.0)
Hemoglobin: 11.7 g/dL — ABNORMAL LOW (ref 12.0–15.0)
Lymphocytes Relative: 26.3 % (ref 12.0–46.0)
Lymphs Abs: 2.3 K/uL (ref 0.7–4.0)
MCHC: 34.7 g/dL (ref 30.0–36.0)
MCV: 95.6 fl (ref 78.0–100.0)
Monocytes Absolute: 0.5 K/uL (ref 0.1–1.0)
Monocytes Relative: 5.7 % (ref 3.0–12.0)
Neutro Abs: 5.7 K/uL (ref 1.4–7.7)
Neutrophils Relative %: 65.1 % (ref 43.0–77.0)
Platelets: 274 K/uL (ref 150.0–400.0)
RBC: 3.54 Mil/uL — ABNORMAL LOW (ref 3.87–5.11)
RDW: 13.1 % (ref 11.5–15.5)
WBC: 8.7 K/uL (ref 4.0–10.5)

## 2024-08-09 LAB — COMPREHENSIVE METABOLIC PANEL WITH GFR
ALT: 11 U/L (ref 0–35)
AST: 12 U/L (ref 0–37)
Albumin: 3.5 g/dL (ref 3.5–5.2)
Alkaline Phosphatase: 62 U/L (ref 39–117)
BUN: 8 mg/dL (ref 6–23)
CO2: 23 meq/L (ref 19–32)
Calcium: 9 mg/dL (ref 8.4–10.5)
Chloride: 105 meq/L (ref 96–112)
Creatinine, Ser: 0.54 mg/dL (ref 0.40–1.20)
GFR: 129.12 mL/min (ref >60.00)
Glucose, Bld: 90 mg/dL (ref 70–99)
Potassium: 3.9 meq/L (ref 3.5–5.1)
Sodium: 136 meq/L (ref 135–145)
Total Bilirubin: 0.2 mg/dL (ref 0.2–1.2)
Total Protein: 6.9 g/dL (ref 6.0–8.3)

## 2024-08-09 LAB — TSH: TSH: 0.38 u[IU]/mL (ref 0.35–5.50)

## 2024-08-09 NOTE — Patient Instructions (Addendum)
-  It was a pleasure to see you today.  -Physical exam completed today. -Continue all prescribed medications. -Work a healthy diet and continuing to participate in regular exercise.  -Ordered labs. Office will call with results and will be available via MyChart.  -Tdap and influenza vaccine provided today.  -Follow up in 1 year for a physical.

## 2024-08-09 NOTE — Progress Notes (Signed)
 Complete physical exam  Patient: Whitney Beard   DOB: Sep 02, 2001   23 y.o. Female  MRN: [id number]  Subjective:    Chief Complaint  Patient presents with   Annual Exam    Whitney Beard is a 23 y.o. female who presents today for a complete physical exam. She reports consuming a general diet. Exercise: Walking most days for 20 minutes a day. She generally feels well. She reports sleeping poorly. She does not have additional problems to discuss today.    Most recent fall risk assessment:    08/09/2024   10:13 AM  Fall Risk   Falls in the past year? 0  Number falls in past yr: 0  Injury with Fall? 0  Risk for fall due to : No Fall Risks  Follow up Falls evaluation completed     Most recent depression screenings:    08/09/2024   10:13 AM 07/05/2024   10:50 AM  PHQ 2/9 Scores  PHQ - 2 Score 0 0  PHQ- 9 Score 3 3    Vision:Not within last year  and Dental: No current dental problems and Receives regular dental care  Past Medical History:  Diagnosis Date   ADHD (attention deficit hyperactivity disorder)    Allergic rhinitis    Asthma    BMI 45.0-49.9, adult (HCC)    Borderline personality disorder (HCC)    Migraines    Obesity    Prediabetes    states today 11/11/20 was told in the past that she was pre- diabetic   Sleep apnea    Suicide attempt [facility])    Past Surgical History:  Procedure Laterality Date   ADENOIDECTOMY     TONSILLECTOMY     WISDOM TOOTH EXTRACTION Bilateral    Social History   Tobacco Use   Smoking status: Former    Current packs/day: 0.50    Types: Cigarettes   Smokeless tobacco: Never   Tobacco comments:    vapes   Vaping Use   Vaping status: Former  Substance Use Topics   Alcohol use: Not Currently   Drug use: Not Currently   Social History   Socioeconomic History   Marital status: Single    Spouse name: Not on file   Number of children: 0   Years of education: Not on file   Highest education level: 12th grade   Occupational History   Not on file  Tobacco Use   Smoking status: Former    Current packs/day: 0.50    Types: Cigarettes   Smokeless tobacco: Never   Tobacco comments:    vapes   Vaping Use   Vaping status: Former  Substance and Sexual Activity   Alcohol use: Not Currently   Drug use: Not Currently   Sexual activity: Yes    Birth control/protection: None  Other Topics Concern   Not on file  Social History Narrative   Lives with mom.    Social Drivers of Health   Financial Resource Strain: Medium Risk (08/09/2024)   Overall Financial Resource Strain (CARDIA)    Difficulty of Paying Living Expenses: Somewhat hard  Food Insecurity: Food Insecurity Present (08/09/2024)   Hunger Vital Sign    Worried About Running Out of Food in the Last Year: Sometimes true    Ran Out of Food in the Last Year: Never true  Transportation Needs: No Transportation Needs (08/09/2024)   PRAPARE - Administrator, Civil Service (Medical): No    Lack of Transportation (Non-Medical):  No  Physical Activity: Insufficiently Active (08/09/2024)   Exercise Vital Sign    Days of Exercise per Week: 3 days    Minutes of Exercise per Session: 20 min  Stress: Stress Concern Present (08/09/2024)   [employer] of Occupational Health - Occupational Stress Questionnaire    Feeling of Stress: Rather much  Social Connections: Moderately Isolated (08/09/2024)   Social Connection and Isolation Panel    Frequency of Communication with Friends and Family: More than three times a week    Frequency of Social Gatherings with Friends and Family: More than three times a week    Attends Religious Services: 1 to 4 times per year    Active Member of [employer] or Organizations: No    Attends Banker Meetings: Not on file    Marital Status: Never married  Intimate Partner Violence: Not At Risk (06/24/2024)   Humiliation, Afraid, Rape, and Kick questionnaire    Fear of Current or Ex-Partner: No     Emotionally Abused: No    Physically Abused: No    Sexually Abused: No   Family Status  Relation Name Status   Father  (Not Specified)   MGM  (Not Specified)   Mat Aunt  (Not Specified)   PGM  (Not Specified)  No partnership data on file   Family History  Problem Relation Age of Onset   Bipolar disorder Father    Schizophrenia Father    Liver disease Father    Asthma Father    Asthma Maternal Grandmother    Schizophrenia Maternal Grandmother    SIDS Maternal Aunt    Heart disease Paternal Grandmother    Diabetes Paternal Grandmother    Seizures Paternal Grandmother    Allergies  Allergen Reactions   Other Shortness Of Breath    Animals such as cats and dogs and pollen - sneezing, asthma trigger     Patient Care Team: Billy Philippe SAUNDERS, NP as PCP - General (Family Medicine) Ileana Babara Rushie Steffan, MD as Consulting Physician (Obstetrics) Neda Jennet LABOR, MD as Consulting Physician (Pulmonary Disease)   Outpatient Medications Prior to Visit  Medication Sig   acetaminophen  (TYLENOL ) 500 MG tablet Take 1 tablet (500 mg total) by mouth every 6 (six) hours as needed for headache.   albuterol  (PROVENTIL ) (2.5 MG/3ML) 0.083% nebulizer solution Take 3 mLs (2.5 mg total) by nebulization every 4 (four) hours as needed for wheezing or shortness of breath.   albuterol  (VENTOLIN  HFA) 108 (90 Base) MCG/ACT inhaler Inhale 2 puffs into the lungs every 4 (four) hours as needed for wheezing or shortness of breath.   aspirin  EC 81 MG tablet Take 1 tablet (81 mg total) by mouth daily. Swallow whole.   Blood Pressure Monitoring (BLOOD PRESSURE KIT) DEVI Check BP daily   budesonide  (PULMICORT ) 0.25 MG/2ML nebulizer solution Take 2 mLs (0.25 mg total) by nebulization 2 (two) times daily.   busPIRone  (BUSPAR ) 10 MG tablet Take 1 tablet (10 mg total) by mouth 3 (three) times daily as needed.   ondansetron  (ZOFRAN ) 4 MG tablet Take 1 tablet (4 mg total) by mouth every 8 (eight) hours as needed  for nausea or vomiting.   prenatal vitamin w/FE, FA (PRENATAL 1 + 1) 27-1 MG TABS tablet Take 1 tablet by mouth daily at 12 noon.   No facility-administered medications prior to visit.    Review of Systems  Constitutional: Negative.   HENT: Negative.    Eyes: Negative.   Respiratory: Negative.  Cardiovascular: Negative.   Gastrointestinal: Negative.   Genitourinary: Negative.   Musculoskeletal: Negative.   Skin: Negative.   Neurological: Negative.   Endo/Heme/Allergies: Negative.   Psychiatric/Behavioral: Negative.     See HPI above    Objective:   BP 122/80   Pulse 100   Temp 98.5 F (36.9 C) (Oral)   Ht 5' 6 (1.676 m)   Wt (!) 345 lb (156.5 kg)   LMP 02/25/2024 (Approximate)   SpO2 98%   BMI 55.68 kg/m    Physical Exam Vitals reviewed.  Constitutional:      General: She is not in acute distress.    Appearance: Normal appearance. She is obese. She is not ill-appearing, toxic-appearing or diaphoretic.  HENT:     Head: Normocephalic and atraumatic.     Right Ear: Tympanic membrane, ear canal and external ear normal. There is no impacted cerumen.     Left Ear: Tympanic membrane, ear canal and external ear normal. There is no impacted cerumen.     Nose:     Right Sinus: No maxillary sinus tenderness or frontal sinus tenderness.     Left Sinus: No maxillary sinus tenderness or frontal sinus tenderness.     Mouth/Throat:     Mouth: Mucous membranes are moist.     Pharynx: Oropharynx is clear. Uvula midline. No pharyngeal swelling, oropharyngeal exudate, posterior oropharyngeal erythema or uvula swelling.  Eyes:     General:        Right eye: No discharge.        Left eye: No discharge.     Conjunctiva/sclera: Conjunctivae normal.     Pupils: Pupils are equal, round, and reactive to light.  Neck:     Thyroid : No thyromegaly.  Cardiovascular:     Rate and Rhythm: Normal rate and regular rhythm.     Heart sounds: Normal heart sounds. No murmur heard.    No  friction rub. No gallop.  Pulmonary:     Effort: Pulmonary effort is normal. No respiratory distress.     Breath sounds: Normal breath sounds.  Abdominal:     General: Abdomen is flat. Bowel sounds are normal. There is no distension.     Palpations: Abdomen is soft. There is no mass.     Tenderness: There is no abdominal tenderness.  Musculoskeletal:        General: Normal range of motion.     Cervical back: Normal range of motion.     Right lower leg: No edema.     Left lower leg: No edema.  Lymphadenopathy:     Cervical: No cervical adenopathy.  Skin:    General: Skin is warm and dry.  Neurological:     General: No focal deficit present.     Mental Status: She is alert and oriented to person, place, and time. Mental status is at baseline.  Psychiatric:        Mood and Affect: Mood normal.        Behavior: Behavior normal.        Thought Content: Thought content normal.        Judgment: Judgment normal.        Assessment & Plan:    Routine Health Maintenance and Physical Exam  Immunization History  Administered Date(s) Administered   DTaP 09/02/2000, 10/13/2000, 12/08/2000, 09/20/2001, 03/10/2005   HIB (PRP-T) 10/13/2000, 12/08/2000, 06/25/2001   Hepatitis A, Ped/Adol-2 Dose 06/29/2006, 10/14/2007   Hepatitis B, PED/ADOLESCENT February 12, 2000, 07/21/2000, 02/15/2001   IPV 03-Nov-2000, 10/13/2000, 06/22/2001, 03/10/2005  Influenza,inj,Quad PF,6+ Mos 08/27/2016, 11/13/2019, 11/12/2020   Influenza-Unspecified 11/13/2019   MMR 06/22/2001, 03/10/2005   Meningococcal Conjugate 10/09/2015   Pneumococcal Conjugate-13 08/26/2000, 10/13/2000, 12/08/2000, 10/30/2002   Pneumococcal Polysaccharide-23 11/12/2020   Tdap 07/13/2011   Varicella 06/22/2001, 06/29/2006    Health Maintenance  Topic Date Due   HPV VACCINES (1 - 3-dose series) Never done   DTaP/Tdap/Td (7 - Td or Tdap) 07/12/2021   Influenza Vaccine  06/30/2024   COVID-19 Vaccine (1 - 2024-25 season) Never done    CHLAMYDIA SCREENING  05/22/2025   Cervical Cancer Screening (Pap smear)  06/24/2027   Pneumococcal Vaccine (2 of 2 - PCV20 or PCV21) 06/21/2050   Hepatitis B Vaccines 19-59 Average Risk  Completed   Hepatitis C Screening  Completed   HIV Screening  Completed   Meningococcal B Vaccine  Aged Out    Discussed health benefits of physical activity, and encouraged her to engage in regular exercise appropriate for her age and condition.  Annual physical exam -     CBC with Differential/Platelet -     Comprehensive metabolic panel with GFR -     TSH -     Lipid panel  Screening-pulmonary TB -     QuantiFERON-TB Gold Plus  History of anemia -     Iron, TIBC and Ferritin Panel  Class 3 severe obesity due to excess calories with serious comorbidity and body mass index (BMI) of 50.0 to 59.9 in adult -     CBC with Differential/Platelet -     Comprehensive metabolic panel with GFR -     TSH -     Lipid panel  1.Review health maintenance: -Covid booster: Denies  -Tdap vaccine: Administered -HPV vaccine: Will discuss with GYN  -Influenza vaccine: Administered  2.Physical exam completed today. 3.Continue all prescribed medications. 4.Work a healthy diet and continuing to participate in regular exercise.  5.Ordered labs (CBC, CMP, TSH, Lipid-not fasting, iron profile, and Quantiferon TB) based BMI, history of anemia, and screening for TB for employer.  Office will call with results and will be available via MyChart.   Return in about 1 year (around 08/09/2025) for physical.     Evonda Enge, NP

## 2024-08-09 NOTE — Addendum Note (Signed)
 Addended by: ELNER NANNY B on: 08/09/2024 11:17 AM   Modules accepted: Orders

## 2024-08-10 ENCOUNTER — Ambulatory Visit: Payer: Self-pay | Admitting: Family Medicine

## 2024-08-10 DIAGNOSIS — E611 Iron deficiency: Secondary | ICD-10-CM

## 2024-08-10 DIAGNOSIS — Z6841 Body Mass Index (BMI) 40.0 and over, adult: Secondary | ICD-10-CM

## 2024-08-10 NOTE — Addendum Note (Signed)
 Addended by: ELNER NANNY B on: 08/10/2024 04:44 PM   Modules accepted: Orders

## 2024-08-11 ENCOUNTER — Ambulatory Visit: Payer: Self-pay | Admitting: Family Medicine

## 2024-08-11 ENCOUNTER — Ambulatory Visit (INDEPENDENT_AMBULATORY_CARE_PROVIDER_SITE_OTHER): Payer: MEDICAID

## 2024-08-11 DIAGNOSIS — E66813 Obesity, class 3: Secondary | ICD-10-CM

## 2024-08-11 DIAGNOSIS — Z6841 Body Mass Index (BMI) 40.0 and over, adult: Secondary | ICD-10-CM | POA: Diagnosis not present

## 2024-08-11 LAB — QUANTIFERON-TB GOLD PLUS
Mitogen-NIL: >10 [IU]/mL
NIL: 0.03 [IU]/mL
QuantiFERON-TB Gold Plus: NEGATIVE
TB1-NIL: 0 [IU]/mL
TB2-NIL: <0 [IU]/mL

## 2024-08-11 LAB — IRON,TIBC AND FERRITIN PANEL
%SAT: 9 % — ABNORMAL LOW (ref 16–45)
Ferritin: 15 ng/mL — ABNORMAL LOW (ref 16–154)
Iron: 39 ug/dL — ABNORMAL LOW (ref 40–190)
TIBC: 421 ug/dL (ref 250–450)

## 2024-08-11 LAB — HEMOGLOBIN A1C: Hgb A1c MFr Bld: 5.1 % (ref 4.6–6.5)

## 2024-08-14 ENCOUNTER — Ambulatory Visit (HOSPITAL_COMMUNITY): Payer: MEDICAID | Admitting: Psychiatry

## 2024-08-21 ENCOUNTER — Encounter (HOSPITAL_BASED_OUTPATIENT_CLINIC_OR_DEPARTMENT_OTHER): Payer: Self-pay | Admitting: Obstetrics & Gynecology

## 2024-08-29 ENCOUNTER — Ambulatory Visit (HOSPITAL_COMMUNITY): Payer: MEDICAID | Admitting: Clinical

## 2024-08-29 DIAGNOSIS — F3162 Bipolar disorder, current episode mixed, moderate: Secondary | ICD-10-CM

## 2024-08-30 ENCOUNTER — Ambulatory Visit: Payer: MEDICAID | Admitting: Obstetrics

## 2024-08-30 ENCOUNTER — Other Ambulatory Visit: Payer: Self-pay | Admitting: *Deleted

## 2024-08-30 ENCOUNTER — Ambulatory Visit (INDEPENDENT_AMBULATORY_CARE_PROVIDER_SITE_OTHER): Payer: MEDICAID | Admitting: Physician Assistant

## 2024-08-30 ENCOUNTER — Ambulatory Visit: Payer: MEDICAID | Attending: Obstetrics and Gynecology

## 2024-08-30 VITALS — BP 128/73 | HR 77 | Temp 98.2°F | Ht 66.0 in | Wt 344.8 lb

## 2024-08-30 VITALS — BP 121/76 | HR 90

## 2024-08-30 DIAGNOSIS — F411 Generalized anxiety disorder: Secondary | ICD-10-CM

## 2024-08-30 DIAGNOSIS — E669 Obesity, unspecified: Secondary | ICD-10-CM

## 2024-08-30 DIAGNOSIS — O99213 Obesity complicating pregnancy, third trimester: Secondary | ICD-10-CM | POA: Insufficient documentation

## 2024-08-30 DIAGNOSIS — O99210 Obesity complicating pregnancy, unspecified trimester: Secondary | ICD-10-CM | POA: Diagnosis present

## 2024-08-30 DIAGNOSIS — Z3A25 25 weeks gestation of pregnancy: Secondary | ICD-10-CM

## 2024-08-30 DIAGNOSIS — O99212 Obesity complicating pregnancy, second trimester: Secondary | ICD-10-CM | POA: Insufficient documentation

## 2024-08-30 DIAGNOSIS — F3132 Bipolar disorder, current episode depressed, moderate: Secondary | ICD-10-CM

## 2024-08-30 NOTE — Progress Notes (Signed)
 MFM Consult Note  Whitney Beard is currently at 25 weeks and 5 days.  Whitney Beard has been followed due to maternal obesity with a BMI of 51.  Whitney Beard denies any problems since her last exam.  The patient met with our genetic counselor following her last ultrasound exam regarding a family history of albinism.  Whitney Beard has decided to decline carrier screening during her pregnancy and will have the baby tested after birth.  Sonographic findings Single intrauterine pregnancy at 25w 5d.  Fetal cardiac activity:  Observed and appears normal. Presentation: Cephalic. Interval fetal anatomy appears normal. Fetal biometry shows the estimated fetal weight of 1 pound 14 ounces which measures at the 40th percentile. Amniotic fluid volume: Within normal limits. MVP: 4.55 cm. Placenta: Posterior.  The views of the fetal anatomy remain limited due to maternal body habitus.  What was visualized today appeared within normal limits.  There are limitations of prenatal ultrasound such as the inability to detect certain abnormalities due to poor visualization. Various factors such as fetal position, gestational age and maternal body habitus may increase the difficulty in visualizing the fetal anatomy.    Due to maternal obesity, we will continue to follow her with growth ultrasounds.    Weekly fetal testing will be started at around 34 weeks.    Whitney Beard will return in 5 weeks for another growth scan.    The patient stated that all of her questions were answered today.  A total of 20 minutes was spent counseling and coordinating the care for this patient.  Greater than 50% of the time was spent in direct face-to-face contact.
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
## 2024-09-02 ENCOUNTER — Encounter (HOSPITAL_COMMUNITY): Payer: Self-pay | Admitting: Physician Assistant

## 2024-09-11 ENCOUNTER — Other Ambulatory Visit (HOSPITAL_BASED_OUTPATIENT_CLINIC_OR_DEPARTMENT_OTHER): Payer: Self-pay

## 2024-09-11 ENCOUNTER — Telehealth: Payer: Self-pay

## 2024-09-11 ENCOUNTER — Other Ambulatory Visit (HOSPITAL_BASED_OUTPATIENT_CLINIC_OR_DEPARTMENT_OTHER): Payer: MEDICAID

## 2024-09-11 ENCOUNTER — Ambulatory Visit (HOSPITAL_BASED_OUTPATIENT_CLINIC_OR_DEPARTMENT_OTHER): Payer: MEDICAID | Admitting: Obstetrics & Gynecology

## 2024-09-11 VITALS — BP 122/81 | HR 85 | Wt 346.0 lb

## 2024-09-11 DIAGNOSIS — Z3403 Encounter for supervision of normal first pregnancy, third trimester: Secondary | ICD-10-CM

## 2024-09-11 DIAGNOSIS — O3442 Maternal care for other abnormalities of cervix, second trimester: Secondary | ICD-10-CM

## 2024-09-11 DIAGNOSIS — F314 Bipolar disorder, current episode depressed, severe, without psychotic features: Secondary | ICD-10-CM

## 2024-09-11 DIAGNOSIS — O99512 Diseases of the respiratory system complicating pregnancy, second trimester: Secondary | ICD-10-CM

## 2024-09-11 DIAGNOSIS — O99352 Diseases of the nervous system complicating pregnancy, second trimester: Secondary | ICD-10-CM | POA: Diagnosis not present

## 2024-09-11 DIAGNOSIS — Z3402 Encounter for supervision of normal first pregnancy, second trimester: Secondary | ICD-10-CM

## 2024-09-11 DIAGNOSIS — G4733 Obstructive sleep apnea (adult) (pediatric): Secondary | ICD-10-CM | POA: Diagnosis not present

## 2024-09-11 DIAGNOSIS — Z8659 Personal history of other mental and behavioral disorders: Secondary | ICD-10-CM

## 2024-09-11 DIAGNOSIS — O99342 Other mental disorders complicating pregnancy, second trimester: Secondary | ICD-10-CM

## 2024-09-11 DIAGNOSIS — Z3A27 27 weeks gestation of pregnancy: Secondary | ICD-10-CM

## 2024-09-11 DIAGNOSIS — R87612 Low grade squamous intraepithelial lesion on cytologic smear of cervix (LGSIL): Secondary | ICD-10-CM

## 2024-09-11 DIAGNOSIS — J45909 Unspecified asthma, uncomplicated: Secondary | ICD-10-CM | POA: Diagnosis not present

## 2024-09-11 DIAGNOSIS — M545 Low back pain, unspecified: Secondary | ICD-10-CM

## 2024-09-11 NOTE — Progress Notes (Deleted)
 Patient reports not sleeping well and lower back pain. Patient positive on PHQ-9 and GAD
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
## 2024-09-11 NOTE — Progress Notes (Signed)
 PRENATAL VISIT NOTE  Subjective:  Whitney Beard is a 23 y.o. G1P0 at [redacted]w[redacted]d being seen today for ongoing prenatal care.  She is currently monitored for the following issues for this high-risk pregnancy and has OSA (obstructive sleep apnea); Class 3 severe obesity due to excess calories with serious comorbidity and body mass index (BMI) of 50.0 to 59.9 in adult [facility]); Major depressive disorder; Severe bipolar I disorder, current or most recent episode depressed (HCC); Anxiety disorder, unspecified; Cannabis use disorder, mild, abuse; Borderline personality disorder (HCC); Severe persistent allergic asthma (HCC); Encounter for supervision of normal first pregnancy in second trimester; Maternal morbid obesity, antepartum (HCC); Asthma during pregnancy; H/O suicide attempt; History of prediabetes; Asthma exacerbation; LGSIL on Pap smear of cervix; and Family history of albinism on their problem list.  Patient reports low back pain that is bothersome all of the time.  No sciatica.  Massage helps.  Lower left is more painful than right.  Interrupting her sleeping.  Would like referral.  Using pregnancy pillow as well.  Contractions: Not present. Vag. Bleeding: None.  Movement: Present. Denies leaking of fluid.   The following portions of the patient's history were reviewed and updated as appropriate: allergies, current medications, past family history, past medical history, past social history, past surgical history and problem list.   Objective:    Vitals:   09/11/24 0833  BP: 122/81  Pulse: 85  Weight: (!) 346 lb (156.9 kg)    Fetal Status:  Fetal Heart Rate (bpm): 160 Fundal Height: 30 cm Movement: Present    General: Alert, oriented and cooperative. Patient is in no acute distress.  Skin: Skin is warm and dry. No rash noted.   Cardiovascular: Normal heart rate noted  Respiratory: Normal respiratory effort, no problems with respiration noted  Abdomen: Soft, gravid, appropriate for  gestational age.  Pain/Pressure: Present (pelvic pain and lower back pain)     Pelvic: Cervical exam deferred        Extremities: Normal range of motion.  Edema: None  Mental Status: Normal mood and affect. Normal behavior. Normal judgment and thought content.   Assessment and Plan:  Pregnancy: G1P0 at [redacted]w[redacted]d 1. [redacted] weeks gestation of pregnancy - on PNV and baby ASA - recheck 2-3 weeks  2. Encounter for supervision of normal first pregnancy in third trimester (Primary)  3. Asthma during pregnancy - stable - has inhalers and using as prescribed.  Not needing rescue inhaler.  4. OSA (obstructive sleep apnea) - borderline.  Not prescribed CPAP. - has pulmonologist, Dr. Neda  5. LGSIL on Pap smear of cervix - planning to repeat PP  6. Severe bipolar I disorder, current or most recent episode depressed [facility]) - has psychiatrist, therapist, and ACTT team.  Reviewed depression and anxiety screening with pt today.  She feels she is doing well and feels stable and that medications do not need adjusting/changed.  7. History of depression  Preterm labor symptoms and general obstetric precautions including but not limited to vaginal bleeding, contractions, leaking of fluid and fetal movement were reviewed in detail with the patient. Please refer to After Visit Summary for other counseling recommendations.   Return in about 3 weeks (around 10/02/2024).  Future Appointments  Date Time Provider Department Center  09/20/2024 10:40 AM [facility] [address]  10/04/2024  2:15 PM [facility] PROVIDER 1 [facility]  10/04/2024  2:30 PM [facility] US5 [facility]  10/18/2024 10:30 AM Nwoko, Uchenna E, PA [facility] None  11/13/2024  2:00 PM Neda Jennet LABOR, MD [facility] [address] G[place], MD

## 2024-09-11 NOTE — Patient Instructions (Addendum)
 Options for Doula Care in the [place]  As you review your birthing options, consider having a birth doula. A doula is trained to provide support before, during and just after you give birth. There are also postpartum doulas that help you adjust to new parenthood.  While doulas do not provide medical care, they do provide emotional, physical and educational support. A few months before your baby arrives, doulas can help answer questions, ease concerns and help you create and support your birthing plan.    Doulas can help reduce your stress and comfort you and your partner. They can help you cope with labor by helping you use breathing techniques, massage, creative labor positioning, essential oils and affirmations.   Studies show that the benefits of having a doula include:   A more positive birth experience  Fewer requests for pain-relief medication  Less likelihood of cesarean section, commonly called a c-section   Doulas are typically hired via a [employer] account planner between you and the doula. We are happy to provide a list of the most active doulas in the area, all of whom are credentialed by [facility] and will not count as a visitor at your birth.  There are several options for no-cost doula care at our hospital, including:  [facility] Volunteer Doula Program Every [employer] Program  For more information on these programs or to receive a list of doulas active in our area, please email [email or web address]   _______________________________________________________________________________________________________________  [facility] Pediatric Providers  Central/Southeast [place] ([id number]) [facility] Delores, MD; Jeanelle, MD; Anders, MD; Scarlet, MD; McDiarmid, MD; Debborah, MD [address] [place], [place] [address] [phone] Mon-Fri 8:30-12:30, 1:30-5:00  Providers come to see babies during newborn hospitalization Only accepting infants of  Mother's who are seen at [facility] or have siblings seen at   [facility] Medicaid - Yes; Tricare - Yes   [facility] Lutz, MD [address], [place], [place] [address] [phone] Mon, Tue, Thur, Fri 8:30-5:00, Wed 10:00-7:00 (closed 1-2pm daily for lunch) [facility] residents with no insurance.  [place] [employer] only with Medicaid/insurance; Tricare - no  [facility] for [facility]) - Tim and [facility], MD; Delores, MD; Dozier, MD; Artice, MD; Lorrene, MD; Kenney, MD; Azell, MD; Joshua,  MD; Gretel, MD; Leta, MD; Herminio, MD; Gabriella, MD; Taft, MD; Teodora, NP [address] [place], [place], [place] [address] [phone] Mon, Tue, Thur, Fri 8:30-5:30, Wed 9:30-5:30, Sat 8:30-12:30 Only accepting infants of first-time parents or siblings of current patients Hospital discharge coordinator will make follow-up appointment Medicaid - yes; Tricare - yes  East/[place] [phone]) [facility] Free, MD; Nicholaus, MD; Dennise, MD; Valdemar, MD; Marget, MD; [facility], MD; Hunter, MD; Arlys, MD; Trudy, MD [address] [place], [place] [address] [phone] Mon-Fri 8:30-5:00, closed for lunch 12:30-1:30; Sat-Sun 10:00-1:00 Accepting Newborns with commercial insurance only, must call prior to delivery to be accepted into  practice.  Medicaid - no, Tricare - yes   [facility] [address] [place], [place] [address] [phone] or [phone] Mon to Fri 8am to 10pm, Sat 8am to 1pm (virtual only on weekends) Only accepts Medicaid Healthy Blue pts  [facility] ([facility]) - Pediatrics at Anna Florence, MD; Doreene, MD; Charlane Idol, MD; Davia, NP; Florie, MD; Audelia NELS Barrio, MD [address] [place], [place] [address] [phone] Mon-Fri 8:30-5:30 Medicaid - yes, Tricare - yes  [place] ([id number]) [facility]  [facility] Donovan, MD [address], [place], [place] [address] [phone] Pablo Bodily, Wed Fri 8:30-5:00, Sat 8:30-12:00, Closed Thursdays Accepting siblings of established patients and first time mom's if you call prenatally Medicaid- yes; Tricare - yes  [facility] Signa Holm, [place]; Quincy, MD; Richarda RIGGERS; Scifres, PA; Austin, MD; Seabron, MD;  [address], [place], [place] [address] [phone] Mon-Fri 8:30-5:00, closed for lunch 1-2 Only accepting newborns of established patients Medicaid- no; Tricare - yes  [facility] ([address]) [facility] Herlene Rotunda, MD; [address], [place], [place] [address] [phone] Mon-Fri 8:00-5:00 Medicaid - No; Tricare - Yes  [facility] at [facility], [place]; Central, [place] [address] [place], [place] [address] [phone] Mon-Fri 8:00-5:00 Medicaid - No, Tricare - Yes  [facility] Selma, MD; Ty, MD; [place], [place] [address] [place] [place], [place] [address] [phone]  Mon-Fri 8:00-5:00 Medicaid - No; Tricare - Yes  [facility] [address] [place], [place] [address] [phone] Mon-Fri 8:30-5:00 (lunch 12:00-1:00) Medicaid -Yes; Tricare - Yes  [facility] [place], MD [address] [place], [place], [place] [address] [phone] Mon-Fri 8:00-5:00 Seeing newborns of current patients only. No new patients Medicaid - No, Tricare - yes  Nature conservation officer at [place] [address], MD [address] [place], [place] [address] [phone] Mon-Fri 8:00-5:00 Medicaid -yes as secondary coverage only; Tricare - yes  [facility] Lorenzo, [place]; [place], [place]; Sybil, MD; Zelpha, MD; Dozier, MD; [place], [place]; Smoot, NP; Madalyn, MD; Laird, MD [address] [place], [place] [address] [phone] Mon-Fri 8:30-5:00, Sat 9:00-11:00 Accepts commercial  insurance ONLY. Offers free prenatal information sessions for families. Medicaid - No, Tricare - Call first  [facility] S[place], MD; [place], [place]; [place], [place]; [place], [place] [address] [place] [address] [phone] Mon-Fri 7:30-5:30 Medicaid - Yes; Normie JASMINE yes  [place] [phone] & [phone])  [facility], MD [address] [place], [place] [address] [phone] Mon-Thur 8:00-6:00, closed for lunch 12-2, closed Fridays Medicaid - yes; Tricare - no  [facility] Lenon, NP; Sophronia, MD; [place], [place]; [place], [place] [address], [place], [place] [address] [phone] Mon-Fri 7:30-4:30 Medicaid - yes, Tricare - yes  [facility]  Birdie, MD; Belenda, NP; Nicholas, MD; Donnamae, NP [address], [place], [place] [address] [phone] Mon-Fri 8:30-5:00, closed for lunch 1-2, Sat 8:30-12:00 - sick visits only Providers come to see babies at [facility] Only accepting newborns of siblings and first time parents ONLY if who have met with office prior to delivery Medicaid -Yes; Tricare - yes  [facility] - [place], [place]; Wanetta, NP; Prentiss, MD; Debarah, MD:  [address], [place], [place] [address] [phone] Mon- Fri 8:00-5:00, Sat 9:00-12:00 - sick visits only Accepting siblings of established patients and first time mom/baby Medicaid - Yes; Tricare - yes Patients must have vaccinations (baby vaccines)  [place]west [place] [phone] & [phone])  Adult nurse HealthCare at [facility] [address] [place], [place] [address] [phone] Mon-Fri 8:00-5:00 Medicaid - no; Tricare - yes  [facility] Duck, MD; [place], [place]; [place], [place] [address], [place], [place] [address] [phone] Mon-Fri 8:00-5:00 Medicaid- yes; Tricare - yes  [facility] - Myra Verita Brought, MD; Joshua, NP;  Wanda, PA [address] [place], [place] [address] [phone] Mon-Fri 8:00-5:00 Medicaid - Yes; Tricare - yes  [address] [place] [phone])  [facility] [place], [place]; [place], [place]; Tommas, MD; [place], MD; Le Roy, NP; Isenhour, DO; [place], [place]; Chandra, MD; Layla, MD; Orlando, MD; [place], [place]; [place], [place]; [place], [place] 7[address] [facility] [address], [place], [place] [address] [phone] Mon-Fri 8:30-5:00, Sat 9:00-12:00 - sick only Please register online [email or web address] then schedule online or call office Medicaid-Yes; Tricare -yes  [facility] - Premier  Dabrusco, MD; Joaquim, MD; Ruth, MD; [place], NP; [place], [place]; Charlyne, MD; Shlomo, NP; Devora, MD [address], [place], [place] [address] [phone] Mon-Fri 8:00-5:30, Sat&Sun by appointment (phones open at 8:30) Medicaid - Yes; Tricare - yes  [place] [phone] & [phone]) [facility] Dasie FINER; Ponderay, MD; Vaughn, MD; Moishe, NP; [place], DO [address], [place], [place] [address] [phone] M-F 8:00 - 5:15, Sat/Sun 9-12 sick visits only Medicaid - No; Tricare - yes  [facility]  [place], PA-C; [place], PA-C; [place], DO; [place], PA-C; [place], PA-C; Theo NELS [place], MD [address] [place], [place] [address] [phone] Mon-Thur 8:00-7:00, Fri 8:00-5:00 Accepting Medicaid for 13 and under only   [facility] - Family Medicine at [place] ([facility] [place]) [place], [place]; List, FNP; Myrick, MD; Fayetta, PA-C; Kari, MD; Kathy, FNP; Nzenwa, FNP; [place], MD; Myrick, MD [phone] [address], [place], [place] [address] [phone] Mon-Fri 8:30-5:30 Medicaid - Yes; Tricare - yes  [facility]s - [address]  [place], [place]; Leontine, MD; Atilano, MD; Dessa, MD; [place],  NP [address], [place], [place] [address] [phone] Mon-Thur 8:00-5:30, Fri 8:00-5:00, Sat 9:00-12:00 Medicaid - yes, Tricare - yes  [place] [phone])  [facility], [place]; Nanci, MD; [place], [place] [address], [place], [place] [address] [phone] Mon-Fri 8:00-5:00, closed for lunch 12-1 Medicaid - No; Tricare - yes  Nature conservation officer at [facility], MD [address] [place], [place] [address] [phone] Mon-Fri 8:00-5:00 Medicaid - No; Tricare - yes  [facility], MD; Eber, MD; Livingston, MD; Joshua, MD [address], [place], [place] [address] [phone] Mon-Fri 8:00-5:00 Medicaid- Yes; Tricare - yes  [place] [phone])  Adult nurse HealthCare at [facility], [place]; [place], MD [address] [place], [place] [address] [phone] Mon-Fri 8:00-5:00 Medicaid - No; Tricare - yes  [facility] - Karenann Kin - CPNP [address] [place], [place], [place] [address] [phone] Mon-Weds 8:00-6:00, Thurs-Fri 8:00-5:00, Sat 9:00-12:00 Medicaid - yes; Tricare - yes   [facility] Karenann Moores, MD; [place], [place] [address] [place], [place] [address] [phone] Mon-Fri 8:00-5:00 Medicaid - yes; Tricare - yes  [facility] Pediatric Providers  [facility] [address] [place], [place] [address] [phone] CHRISTELLA Cagey: 8am -8pm, Tues, Weds: 8am - 5pm; Fri: 8-1 Medicaid - Yes; Tricare - yes  [facility] Dariel, MD; Vicci, MD; Malvina, MD; [place], [place]; [place], [place] [address] [place], [place] [address] [phone] M-F 8:30 - 5:00 Medicaid - Call office; Tricare -yes  [facility] Devora Hash, MD; Jenelle, MD, Marny, MD; Gustabo, PNP; Wenceslao, NP [phone] [address] [place], [place] [address] [phone] M-F  8:30 - 5:00, Sat/Sun  8:30 - 12:30 (sick visits) Medicaid - Call office; Tricare -yes  [facility] Ezzard, MD; Bard, PNP; Enoch, MD; [place], [place]; [place], NP; Rozelle FINER [address], [place], [place] [address] [phone] M-F 8:30 - 5:00 Medicaid - Call office; Tricare - yes  [facility] Rozell Rosella, MD; Clarance, MD; Dionisio, MD; Nelda, MD; Nogo, MD [phone] [address], [place], [place] [address] [phone] M-Thur: 8:00 - 5:00; Fri: 8:00 - 4:00 Medicaid - yes; Tricare - yes  [facility] [address] [place], [place] [address] [phone] M-F: 8:30- 5:00, closed for lunch 12:30 - 1:00 Medicaid - yes; Tricare -yes  [facility] [address] [place], [place] [address] [phone] M-F 8:00 - 5:00 Medicaid - yes; Tricare - yes  [facility] Crane, DO; [place], DO; [place], NP [address] [place], [place] [address] [phone] M-F 8:00 - 5:00, Closed 12-1 for lunch Medicaid - Call; Tricare - yes  [facility] Loris, MD [address], [place], [place] [address] [phone] M-F: 8:00-5:00, Sat: 8:00 - noon Medicaid - call; Tricare -yes  [facility] Pediatric Providers  [facility] [place], [place] [address], [place], [place] [address] [phone] M-W: 8:00-5:00, Thur: 8:00 - 7:00, Fri: 8:00 - noon Medicaid - yes; Tricare - yes  [facility] Linn Clause, FNP [address] [place], [place] [address] [phone] M-F 8:00 - 5:00, Closed for lunch 12-1 Medicaid - yes; Tricare - yes  [facility] Pediatric Providers  [facility] Care at [place], [place], Seena, MD, [place], FNP-C [address] [facility], [address], [place], [place] [address] [phone] M-T 8:00-5:00, Wed-Fri 7:00-6:00 Medicaid - Yes; Tricare -yes  [facility] at [facility], DO; [address] [place], [place]  [address] [phone] M-F 8:00 - 5:00, closed for lunch 12-1 Medicaid - Yes; Tricare - yes  [facility] Pediatrics and Internal Medicine  Gwenn, MD; Relda, MD; Merleen, MD; Karn, MD; Charlyn, MD; Reeda, MD; Warren, MD, Elizabeth, MD; Krystal, MD; Simon, MD; Darral, MD; Debarah, MD [address], [place], [place] [address] [phone] M-F 8:00-5:00 Medicaid - yes; Tricare - yes  [facility] [place], MD (speaks [place] and Hindi) [address] [place], [place] [address] [phone] M-F: 8:30 - 5:00, closed 12:30 - 1 for lunch Medicaid - Yes; Tricare -yes  [facility] Pediatric Providers  [facility] Delford, MD; Chrystal, MD; Dann, MD [address] [place], [place] [address] [phone] M-Th: 8:00 - 5:30, Fri: 8:00 - 12:00 Medicaid - yes; Tricare - yes  [facility] Pediatrics at [facility], NP; Gladis, MD; Dian, MD [phone] [address], [place], [place] [address] [phone] M-F: 8:00 - 5:00 Medicaid - yes; Tricare - yes  [facility] Hammond, NP; Effie, NP; Renato, NP; Driscilla, MD; Trudy, MD, [place], NP, Edsel, MD; Tobie HAS [address] [place], [place] [address] [phone] M-F: 8:30 - 5:30p Medicaid - yes; Tricare - yes Other locations available as well  [facility], MD; Tanda, MD; Joesph DEVONNA Archie NELS Billy, PA-C [address] [place], [place] [address] [phone] M-W: 8:00am - 7:00pm, Thurs: 8:00am - 8:00pm; Fri: 8:00am - 5:00pm, closed daily from 12-1 for lunch Medicaid - yes; Tricare - yes  [facility] Pediatric Providers  [facility] at [place],  MD; Camelia, FNP; Ruther, MD; Christiana, MD; [place], PNP; Nola RIGGERS; [place], [place]; Lonzell, MD;  [address] [place], [place] [address] [phone] CHRISTELLA - [place]: 8am - 5pm, Sat 9-noon Medicaid - Yes; Tricare  -yes  [facility] at Graciela Chain, MD; Joshua, FNP; Silva, MD; Livingston, MD [address] [place] MARRY [place], [id number] [phone] M-F 8:00 - 5:00 Medicaid - call; Tricare - yes  [facility]- Loraine Edison, MD; [place], [place]; Climmie, MD; Lenon, MD; Sharie NELS Louder, MD; Murphy, MD; Blane NODAL; Lenard, MD; Curly, MD; Paint, MD [address], [place], [place] [address] [phone] M-F 8:00am - 5:00pm; Sat. 9:00 - 11:00 Medicaid - yes; Tricare - yes  [facility] at [facility], MD [address] [place], [place] [address] [phone] M-F 8:00 - 5:00 Medicaid - [place] Medicaid only; Tricare - yes  [facility] - Mccoy Finder, MD; Nicholaus, [place]; Murphy, MD [address] [place], [place] [address] [phone] M-F 8:00 - 5:00 Medicaid - yes; Tricare - yes  [facility] [address] Pediatrics - Kennedy Dames, MD; Delores, MD, [facility], MD, Liberty, MD; [place], MD; Claudene, MD; [address] Christianna [place], [place] [address] [phone] M-F: 8-5 Medicaid - yes; Tricare - yes  [facility] - Valli Nola, [place]; Glasco, MD; [address], [place], [place] [address] [phone] M-F 8-5 Medicaid - yes; Tricare - yes  [address] [place] Micheline GLENWOOD Livingston, MD; Chain, MD; Soldato-Courture, MD; Pellam-Palmer, DNP; [place], PNP [address], [place], [place] [address] [phone] M-F 8-5 Medicaid - yes; Tricare - yes  [facility] and Pediatrics Jennifer, MD; Gregorio RIGGERS; Karole Moots, MD [address], [place], [place] [address] [phone] M-F 7am - 5 pm Medicaid - call; Tricare - yes  [facility] [place], [place]; Rod, MD; Lang, MD [address] [place], [place] [address] [phone] M-F 8-5 Medicaid - yes; Tricare - yes  [facility] Marylouise, MD;  Rory, MD; Trudy, FNP; Burnetta, FNP; Vida, FNP; Elena RIGGERS; [facility] - FNP [address] [place], [place] [address] [phone] M-F 8-5 Medicaid- yes; Tricare - yes  [facility] Pediatrics - Primus Pesa, Lively and Jeannetta Phenix, MD; Gaye, MD; Willistine, MD; Jackquline, MD; [place], [place]; Primus, MD; Rod, MD; Jeannetta, MD [address] [place], [place] [address] [phone] M-F: 8-5, Sat: 9-4, Sun 9-12 Medicaid - yes; Tricare - yes  [facility] Little, PNP; Nicholaus, PNP [address] Christianna Fonder [place], [place] [address] [phone] M-F 8 - 5, closed 12-1 for lunch Medicaid - yes; Tricare - yes  [facility] Kellie, MD; Bradley, MD; Jeannetta, MD; Chacra, DO [address] [place], [place] [address] [phone] M-F 8- 5:30 Medicaid - yes; Tricare - yes  [facility] Pediatrics - Valli Fielding, MD; Levander, MD; Regena, MD [address] [place], [place] [address] [phone] CHRISTELLA: [place]; Tues-Fri: 8-5; Sat: 9-12 Medicaid - yes; Tricare - yes  [facility] Pediatrics - Lurlene Pfeiffer, MD; Gretel, MD; Gretta, MD; Modesto, MD; Rawland, MD [address], [place], [place] [address] [phone] CHRISTELLABETHA MONROEMERL Norrie: 8-5; Sat: 8:30-12:30 Medicaid - yes; Tricare - yes  [facility] Pediatrics - Fonder Rhett Billing, MD; [place], [place] [address] [place], [place] [address] [phone] Mon-Fri: 8-5 Medicaid - yes; Tricare - yes  [facility] Pediatrics - [place], CPNP; [place], ; Jeannetta, MD; Linton, MD; Ambrose, MD; [address] [place], [place] [address] [phone] M-F: 8-5, closed 1-2 for lunch Medicaid - yes; Tricare - yes  [facility] - [facility] [place],  [place]; [place], [place]; Claudene, MD; [place], CPNP; [place], [place]; Bethany, MD; Prentiss, MD [address], [place], [place] [address] [phone] M-Thurs: [place]; Fri: 8-6; Sat: 9-12; Sun 2-4 Medicaid - yes; Tricare - yes  [facility] Alvin Daring, MD; Lupe, MD; Floy CHOL, FNP; Fronie, DO; [address], [place], [place] [address] [phone] M-F: 8-5 Medicaid - yes; Tricare - yes  [facility] Pediatric Providers  [facility] - Family Medicine -Sherryll Hobby, MD; [place], NP [address] [place], [place] [address] [phone] M - Fri: 8am - 5pm, closed for lunch 12-1 Medicaid - Yes; Tricare - yes  [facility] Dea, MD; Carlo, MD; Sanger, DO; Vinocur, MD;Hall, PA; [place], [place]; Elaine, NP [phone], [place], [place] [address] [phone] M-F 8:00 - 5:00, Sat 8:00 - 11:30 Medicaid - yes; Tricare - yes  [facility] Fernand, MD; [place], MD, [address], MD, Tuttle, MD, Duryea, MD; [place], NP; [place], [place];  [address], [place], [place] [address] [phone] M-F 8:10am - 5:00pm Medicaid - yes; Tricare - yes  [facility] Adrien, MD; [place], NP [address] [place], [place] [address] [phone] M-F 8:00 - 5:00 Medicaid - [place] Medicaid only; Tricare - yes  [facility] Family Medicine - Deep [address] [place], MD; [place], NP [address], [place], [place] [address] [phone] M-F 8:00 - 5:00; Closed for lunch 12 - 1:00 Medicaid - yes; Tricare - yes  [facility] Gayl, MD; [place], FNP [address], [place], [place] [address] [phone] Mon 9-5; Tues/Wed 10-5; Thurs 8:30-5; Fri: 8-12:30 Medicaid - yes; Tricare - yes  [facility] Pediatric Providers  [facility]  [place], MD; [place], [place] [address], [place], [place] [address] [phone]  phone [phone] fax M-F 7:15 - 4:30 Medicaid - yes; Tricare - yes  [facility] Caswell, MD; [place], DO [address] [place], [place] [address] [phone] M-Fri: 8:30 - 5:00, closed for lunch everyday noon - 1pm Medicaid - Yes; Tricare - yes  [facility] Burdine, MD; Toribio, MD; Kayla, MD; Atilano, MD; [place], [place]; Jolee NELS Don, [place]; [place], [place]; [place], [place]; [place], [place] [address] [place], [place] [address] [phone] M-Thurs: 7:30am - 7:00pm; Friday 7:30am - 4pm; Sat: 8:00 - 1:00 Medicaid - Yes; Tricare - yes  [facility] of Maryruth Moulding, MD; Rendell, MD; Lord, MD; Jet, DO [address], [place], [place] [address] [phone] M-Thur: 8:00 - 5:00, Fri: 8:00 - Noon Medicaid - yes; Tricare - yes No [employer]  [facility] Dettinger, MD; Jolinda, DO; Montevallo, NP; Gladis, NP; Joesph, NP; Cathlene, NP; Severa, NP; Zollie, MD; [place], [place] [address], [place], [place] [address] [phone] M-F 8:00 - 5:00 Medicaid - yes; Tricare - yes  [facility], FNP-C; [place], FNP-C [address] [place], [place] [address] [phone] M, W, R 8:00-5:00, Tues: 8:00am - 7:00pm; Fri 8:00 - noon Medicaid - Yes; Tricare -  yes  [facility], MD [address] [place], [place] [address] [phone]  M-Thurs 8:30-5:30, Fri: 8:30-12:30pm Medicaid - Yes; Tricare - N

## 2024-09-11 NOTE — Addendum Note (Signed)
 Addended by: CHRISTOBAL CAROLYN BRAVO on: 09/11/2024 08:09 AM   Modules accepted: Orders

## 2024-09-12 ENCOUNTER — Ambulatory Visit (HOSPITAL_BASED_OUTPATIENT_CLINIC_OR_DEPARTMENT_OTHER): Payer: Self-pay | Admitting: Certified Nurse Midwife

## 2024-09-12 ENCOUNTER — Other Ambulatory Visit (HOSPITAL_BASED_OUTPATIENT_CLINIC_OR_DEPARTMENT_OTHER): Payer: Self-pay | Admitting: Certified Nurse Midwife

## 2024-09-12 DIAGNOSIS — Z3402 Encounter for supervision of normal first pregnancy, second trimester: Secondary | ICD-10-CM

## 2024-09-12 LAB — GLUCOSE TOLERANCE, 2 HOURS W/ 1HR
Glucose, 1 hour: 106 mg/dL (ref 70–179)
Glucose, 2 hour: 89 mg/dL (ref 70–152)
Glucose, Fasting: 85 mg/dL (ref 70–91)

## 2024-09-13 ENCOUNTER — Inpatient Hospital Stay (HOSPITAL_COMMUNITY)
Admission: AD | Admit: 2024-09-13 | Discharge: 2024-09-14 | Disposition: A | Discharge status: Home / Self Care | Payer: MEDICAID | Attending: Obstetrics and Gynecology | Admitting: Obstetrics and Gynecology

## 2024-09-13 DIAGNOSIS — N898 Other specified noninflammatory disorders of vagina: Secondary | ICD-10-CM | POA: Insufficient documentation

## 2024-09-13 DIAGNOSIS — O26892 Other specified pregnancy related conditions, second trimester: Secondary | ICD-10-CM | POA: Insufficient documentation

## 2024-09-13 DIAGNOSIS — B9689 Other specified bacterial agents as the cause of diseases classified elsewhere: Secondary | ICD-10-CM

## 2024-09-13 DIAGNOSIS — Z59868 Other specified financial insecurity: Secondary | ICD-10-CM | POA: Insufficient documentation

## 2024-09-13 DIAGNOSIS — Z3A27 27 weeks gestation of pregnancy: Secondary | ICD-10-CM | POA: Insufficient documentation

## 2024-09-13 NOTE — MAU Note (Addendum)
..  Whitney Beard is a 23 y.o. at [redacted]w[redacted]d here in MAU reporting: when she wiped her tissue had mucous on it, patient has picture of what looks like clear mucous-like discharge. Patient is scared because she believes it is her mucous plug. Reports it had a 'fishy' odor to it. Denies vaginal bleeding or cramping. +FM. Reports right upper abdominal pain that began a week ago, reports the pain comes and goes and is sharp. Has not taken anything for the pain.  Pain score: 3/10 Vitals:   09/13/24 2201  BP: (!) 108/57  Pulse: 94  Resp: 18  Temp: 98.5 F (36.9 C)  SpO2: 100%     FHT: 150 Lab orders placed from triage:  UA

## 2024-09-14 ENCOUNTER — Encounter (HOSPITAL_COMMUNITY): Payer: Self-pay | Admitting: Obstetrics and Gynecology

## 2024-09-14 ENCOUNTER — Ambulatory Visit (HOSPITAL_COMMUNITY)
Admission: EM | Admit: 2024-09-14 | Discharge: 2024-09-15 | Disposition: A | Discharge status: Home / Self Care | Payer: MEDICAID

## 2024-09-14 DIAGNOSIS — B9689 Other specified bacterial agents as the cause of diseases classified elsewhere: Secondary | ICD-10-CM | POA: Diagnosis not present

## 2024-09-14 DIAGNOSIS — Z3A28 28 weeks gestation of pregnancy: Secondary | ICD-10-CM | POA: Insufficient documentation

## 2024-09-14 DIAGNOSIS — F39 Unspecified mood [affective] disorder: Secondary | ICD-10-CM | POA: Insufficient documentation

## 2024-09-14 DIAGNOSIS — O99343 Other mental disorders complicating pregnancy, third trimester: Secondary | ICD-10-CM | POA: Insufficient documentation

## 2024-09-14 DIAGNOSIS — O26892 Other specified pregnancy related conditions, second trimester: Secondary | ICD-10-CM

## 2024-09-14 DIAGNOSIS — R45851 Suicidal ideations: Secondary | ICD-10-CM | POA: Insufficient documentation

## 2024-09-14 DIAGNOSIS — F411 Generalized anxiety disorder: Secondary | ICD-10-CM | POA: Insufficient documentation

## 2024-09-14 DIAGNOSIS — N898 Other specified noninflammatory disorders of vagina: Secondary | ICD-10-CM | POA: Diagnosis not present

## 2024-09-14 DIAGNOSIS — Z3A27 27 weeks gestation of pregnancy: Secondary | ICD-10-CM

## 2024-09-14 DIAGNOSIS — F69 Unspecified disorder of adult personality and behavior: Secondary | ICD-10-CM

## 2024-09-14 DIAGNOSIS — O9A513 Psychological abuse complicating pregnancy, third trimester: Secondary | ICD-10-CM | POA: Insufficient documentation

## 2024-09-14 DIAGNOSIS — Z9151 Personal history of suicidal behavior: Secondary | ICD-10-CM | POA: Insufficient documentation

## 2024-09-14 DIAGNOSIS — Z59868 Other specified financial insecurity: Secondary | ICD-10-CM | POA: Diagnosis not present

## 2024-09-14 DIAGNOSIS — R4586 Emotional lability: Secondary | ICD-10-CM

## 2024-09-14 LAB — GC/CHLAMYDIA PROBE AMP (~~LOC~~) NOT AT ARMC
Chlamydia: NEGATIVE
Comment: NEGATIVE
Comment: NORMAL
Neisseria Gonorrhea: NEGATIVE

## 2024-09-14 LAB — WET PREP, GENITAL
Sperm: NONE SEEN
Trich, Wet Prep: NONE SEEN
WBC, Wet Prep HPF POC: <10 (ref <10)
Yeast Wet Prep HPF POC: NONE SEEN

## 2024-09-14 LAB — URINALYSIS, ROUTINE W REFLEX MICROSCOPIC
Bilirubin Urine: NEGATIVE
Glucose, UA: NEGATIVE mg/dL
Hgb urine dipstick: NEGATIVE
Ketones, ur: NEGATIVE mg/dL
Nitrite: NEGATIVE
Protein, ur: NEGATIVE mg/dL
Specific Gravity, Urine: 1.013 (ref 1.005–1.030)
pH: 6 (ref 5.0–8.0)

## 2024-09-14 LAB — POCT FERN TEST: POCT Fern Test: NEGATIVE

## 2024-09-14 MED ORDER — METRONIDAZOLE 500 MG PO TABS: 500.0000 mg | ORAL_TABLET | Freq: Two times a day (BID) | ORAL | 0 refills | Status: AC

## 2024-09-14 NOTE — MAU Provider Note (Signed)
 History     [id number]  Arrival date and time: 09/13/24 2125    Chief Complaint  Patient presents with   Vaginal Discharge   Abdominal Pain     HPI Whitney Beard is a 23 y.o. at [redacted]w[redacted]d by 6w US , who presents for vaginal discharge. Today started having vaginal discharge and patient thought she might have lost her mucous plug. Denies vaginal bleeding, contractions. Endorses good FM.     O/Positive/-- (06/23 1544)  Past Medical History:  Diagnosis Date   ADHD (attention deficit hyperactivity disorder)    Allergic rhinitis    Asthma    BMI 45.0-49.9, adult (HCC)    Borderline personality disorder (HCC)    Migraines    Obesity    Prediabetes    states today 11/11/20 was told in the past that she was pre- diabetic   Sleep apnea    Suicide attempt [facility])     Past Surgical History:  Procedure Laterality Date   ADENOIDECTOMY     TONSILLECTOMY     WISDOM TOOTH EXTRACTION Bilateral     Family History  Problem Relation Age of Onset   Bipolar disorder Father    Schizophrenia Father    Liver disease Father    Asthma Father    SIDS Maternal Aunt    Asthma Maternal Grandmother    Schizophrenia Maternal Grandmother    Heart disease Paternal Grandmother    Diabetes Paternal Grandmother    Seizures Paternal Grandmother     Social History   Socioeconomic History   Marital status: Single    Spouse name: Not on file   Number of children: 0   Years of education: Not on file   Highest education level: 12th grade  Occupational History   Not on file  Tobacco Use   Smoking status: Former    Current packs/day: 0.50    Types: Cigarettes   Smokeless tobacco: Never   Tobacco comments:    vapes   Vaping Use   Vaping status: Former  Substance and Sexual Activity   Alcohol use: Not Currently   Drug use: Not Currently   Sexual activity: Yes    Birth control/protection: None  Other Topics Concern   Not on file  Social History Narrative   Lives with mom.     Social Drivers of Health   Financial Resource Strain: Medium Risk (08/09/2024)   Overall Financial Resource Strain (CARDIA)    Difficulty of Paying Living Expenses: Somewhat hard  Food Insecurity: Food Insecurity Present (08/09/2024)   Hunger Vital Sign    Worried About Running Out of Food in the Last Year: Sometimes true    Ran Out of Food in the Last Year: Never true  Transportation Needs: No Transportation Needs (08/09/2024)   PRAPARE - Administrator, Civil Service (Medical): No    Lack of Transportation (Non-Medical): No  Physical Activity: Insufficiently Active (08/09/2024)   Exercise Vital Sign    Days of Exercise per Week: 3 days    Minutes of Exercise per Session: 20 min  Stress: Stress Concern Present (08/09/2024)   [employer] of Occupational Health - Occupational Stress Questionnaire    Feeling of Stress: Rather much  Social Connections: Moderately Isolated (08/09/2024)   Social Connection and Isolation Panel    Frequency of Communication with Friends and Family: More than three times a week    Frequency of Social Gatherings with Friends and Family: More than three times a week    Attends Religious  Services: 1 to 4 times per year    Active Member of Clubs or Organizations: No    Attends Banker Meetings: Not on file    Marital Status: Never married  Intimate Partner Violence: Not At Risk (06/24/2024)   Humiliation, Afraid, Rape, and Kick questionnaire    Fear of Current or Ex-Partner: No    Emotionally Abused: No    Physically Abused: No    Sexually Abused: No    Allergies  Allergen Reactions   Other Shortness Of Breath    Animals such as cats and dogs and pollen - sneezing, asthma trigger     No current facility-administered medications on file prior to encounter.   Current Outpatient Medications on File Prior to Encounter  Medication Sig Dispense Refill   aspirin  EC 81 MG tablet Take 1 tablet (81 mg total) by mouth daily. Swallow  whole.     budesonide  (PULMICORT ) 0.25 MG/2ML nebulizer solution Take 2 mLs (0.25 mg total) by nebulization 2 (two) times daily. 60 mL 12   busPIRone  (BUSPAR ) 10 MG tablet Take 1 tablet (10 mg total) by mouth 3 (three) times daily as needed. 30 tablet 0   prenatal vitamin w/FE, FA (PRENATAL 1 + 1) 27-1 MG TABS tablet Take 1 tablet by mouth daily at 12 noon. 30 tablet 12   acetaminophen  (TYLENOL ) 500 MG tablet Take 1 tablet (500 mg total) by mouth every 6 (six) hours as needed for headache. 30 tablet    albuterol  (PROVENTIL ) (2.5 MG/3ML) 0.083% nebulizer solution Take 3 mLs (2.5 mg total) by nebulization every 4 (four) hours as needed for wheezing or shortness of breath. 75 mL 2   albuterol  (VENTOLIN  HFA) 108 (90 Base) MCG/ACT inhaler Inhale 2 puffs into the lungs every 4 (four) hours as needed for wheezing or shortness of breath. 18 g 1   Blood Pressure Monitoring (BLOOD PRESSURE KIT) DEVI Check BP daily 1 each 0   ondansetron  (ZOFRAN ) 4 MG tablet Take 1 tablet (4 mg total) by mouth every 8 (eight) hours as needed for nausea or vomiting. 30 tablet 3    Pertinent positives and negative per HPI, all others reviewed and negative  Physical Exam   BP 117/69 (BP Location: Right Arm)   Pulse 84   Temp 98.5 F (36.9 C) (Oral)   Resp 18   Ht 5' 6 (1.676 m)   Wt (!) 158.5 kg   LMP 02/25/2024 (Approximate)   SpO2 100%   BMI 56.39 kg/m   Patient Vitals for the past 24 hrs:  BP Temp Temp src Pulse Resp SpO2 Height Weight  09/14/24 0032 117/69 -- -- 84 -- -- -- --  09/13/24 2201 (!) 108/57 98.5 F (36.9 C) Oral 94 18 100 % 5' 6 (1.676 m) (!) 158.5 kg    Physical Exam Vitals and nursing note reviewed. Exam conducted with a chaperone present.  Constitutional:      Appearance: She is well-developed.  HENT:     Head: Normocephalic and atraumatic.     Mouth/Throat:     Mouth: Mucous membranes are moist.  Eyes:     Extraocular Movements: Extraocular movements intact.  Cardiovascular:      Rate and Rhythm: Normal rate and regular rhythm.  Pulmonary:     Effort: Pulmonary effort is normal.  Abdominal:     Palpations: Abdomen is soft.     Tenderness: There is no abdominal tenderness.  Genitourinary:    General: Normal vulva.     Exam  position: Lithotomy position.     Vagina: Vaginal discharge present.     Comments: Cervix normal appearing, mucous discharge present in the vagina.  Skin:    Capillary Refill: Capillary refill takes less than 2 seconds.  Neurological:     General: No focal deficit present.     Mental Status: She is alert.     FHT Baseline: 130 bpm Variability: Good {> 6 bpm) Accelerations: Reactive Decelerations: Absent Uterine activity: None  Labs Results for orders placed or performed during the hospital encounter of 09/13/24 (from the past 24 hours)  Urinalysis, Routine w reflex microscopic -Urine, Clean Catch     Status: Abnormal   Collection Time: 09/14/24 12:05 AM  Result Value Ref Range   Color, Urine YELLOW YELLOW   APPearance CLOUDY (A) CLEAR   Specific Gravity, Urine 1.013 1.005 - 1.030   pH 6.0 5.0 - 8.0   Glucose, UA NEGATIVE NEGATIVE mg/dL   Hgb urine dipstick NEGATIVE NEGATIVE   Bilirubin Urine NEGATIVE NEGATIVE   Ketones, ur NEGATIVE NEGATIVE mg/dL   Protein, ur NEGATIVE NEGATIVE mg/dL   Nitrite NEGATIVE NEGATIVE   Leukocytes,Ua SMALL (A) NEGATIVE   RBC / HPF 0-5 0 - 5 RBC/hpf   WBC, UA 6-10 0 - 5 WBC/hpf   Bacteria, UA FEW (A) NONE SEEN   Squamous Epithelial / HPF 21-50 0 - 5 /HPF   Mucus PRESENT   POCT fern test     Status: Normal   Collection Time: 09/14/24  1:02 AM  Result Value Ref Range   POCT Fern Test Negative = intact amniotic membranes   Wet prep, genital     Status: Abnormal   Collection Time: 09/14/24  1:02 AM   Specimen: PATH Cytology Cervicovaginal Ancillary Only  Result Value Ref Range   Yeast Wet Prep HPF POC NONE SEEN NONE SEEN   Trich, Wet Prep NONE SEEN NONE SEEN   Clue Cells Wet Prep HPF POC PRESENT  (A) NONE SEEN   WBC, Wet Prep HPF POC <10 <10   Sperm NONE SEEN     Imaging No results found.  MAU Course  Procedures Lab Orders         Wet prep, genital         Culture, OB Urine         Urinalysis, Routine w reflex microscopic -Urine, Clean Catch         POCT fern test    Meds ordered this encounter  Medications   metroNIDAZOLE (FLAGYL) 500 MG tablet    Sig: Take 1 tablet (500 mg total) by mouth 2 (two) times daily for 7 days.    Dispense:  14 tablet    Refill:  0   Imaging Orders  No imaging studies ordered today    MDM Moderate (Level 3-4)  Assessment and Plan  #Vaginal Discharge #[redacted] weeks gestation of pregnancy  #FWB NST: Reactive  Whitney Beard is a 23 y.o. at [redacted]w[redacted]d by 6w US , who presents for vaginal discharge.   -U/A indicates possible early infection. Urine culture pending -Wet prep consistent with BV -Fern test negative -GC/Ch pending  -Stable for discharge home -F/U on urine culture -Rx sent for metronidazole to treat BV -All questions answered, anticipatory guidance and detailed return precautions provided.  Sharmayne Jablon L Cina Klumpp, MD/MHA 09/14/24 4:29 AM  Allergies as of 09/14/2024       Reactions   Other Shortness Of Breath   Animals such as cats and dogs and pollen - sneezing,  asthma trigger         Medication List     TAKE these medications    acetaminophen  500 MG tablet Commonly known as: TYLENOL  Take 1 tablet (500 mg total) by mouth every 6 (six) hours as needed for headache.   aspirin  EC 81 MG tablet Take 1 tablet (81 mg total) by mouth daily. Swallow whole.   Blood Pressure Kit Devi Check BP daily   budesonide  0.25 MG/2ML nebulizer solution Commonly known as: PULMICORT  Take 2 mLs (0.25 mg total) by nebulization 2 (two) times daily.   busPIRone  10 MG tablet Commonly known as: BUSPAR  Take 1 tablet (10 mg total) by mouth 3 (three) times daily as needed.   metroNIDAZOLE 500 MG tablet Commonly known as: FLAGYL Take 1  tablet (500 mg total) by mouth 2 (two) times daily for 7 days.   ondansetron  4 MG tablet Commonly known as: Zofran  Take 1 tablet (4 mg total) by mouth every 8 (eight) hours as needed for nausea or vomiting.   prenatal vitamin w/FE, FA 27-1 MG Tabs tablet Take 1 tablet by mouth daily at 12 noon.   Ventolin  HFA 108 (90 Base) MCG/ACT inhaler Generic drug: albuterol  Inhale 2 puffs into the lungs every 4 (four) hours as needed for wheezing or shortness of breath.   albuterol  (2.5 MG/3ML) 0.083% nebulizer solution Commonly known as: PROVENTIL  Take 3 mLs (2.5 mg total) by nebulization every 4 (four) hours as needed for wheezing or shortness of breath.

## 2024-09-14 NOTE — ED Provider Notes (Signed)
 [facility] Continuous Assessment Admission H&P  Date: 09/15/24 Patient Name: Whitney Beard MRN: [id number] Chief Complaint: came in by [employer]  Diagnoses:  Final diagnoses:  [redacted] weeks gestation of pregnancy  Suicidal ideation  Behavior concern in adult  Mood swings    HPI: Whitney Beard, 23 y/o female with a history of mood disorder, anxiety..  And passive SI.  Presented to [facility] via [employer].  Upon talking to trying to talk to the patient patient refused to cooperate patient is observed laying on the floor saying that she just want to sleep writer asked patient multiple times to get up off the floor she said no.  Patient keeps saying that just go ahead and admit because that is what you are going to do.  Patient keeps sobbing.  Asked if she was suicidal she said no when asked what happened and why was the police call she said that her mother had called the police but patient did not state why the mother had called the police with patient verbal consent she gave me the mother's number to call the mother.  TTS did reach out to patient's mother.  Per her mother patient stated that she was going to kill herself and her baby the police were called and the patient grabbed a knife threatening to end their lives.  Her mother states that her ACT team took her off of all of her medicine due to her pregnancy.  She reports a previous suicide attempt 3 years ago where she attempted to overdose on medication and went home and was hospitalized.  She was arguing with her sister tonight which is what escalated her behavior.  Her mother states that it was not a physical altercation but only a verbal altercation.  Her mother also states that she is her power of attorney and has legal documentation.   Face-to-face observation of patient, patient is alert and oriented x 4, speech is clear however patient is crying so she does sound a little muffled at times.  Patient is very distraught and very tearful.  Patient however  denies SI, HI, AVH or paranoia.  Patient not really cooperating with answering questions as asked.  According to patient she is currently pregnant.  Total Time spent with patient: 30 minutes  Musculoskeletal  Strength & Muscle Tone: within normal limits Gait & Station: normal Patient leans: N/A  Psychiatric Specialty Exam  Presentation General Appearance:  Casual  Eye Contact: Absent  Speech: Blocked  Speech Volume: Decreased  Handedness: Right   Mood and Affect  Mood: Angry; Anxious  Affect: Labile   Thought Process  Thought Processes: Linear  Descriptions of Associations:Circumstantial  Orientation:Full (Time, Place and Person)  Thought Content:WDL  Diagnosis of Schizophrenia or Schizoaffective disorder in past: No data recorded  Hallucinations:Hallucinations: None  Ideas of Reference:None  Suicidal Thoughts:Suicidal Thoughts: Yes, Passive SI Passive Intent and/or Plan: With Intent; Without Plan  Homicidal Thoughts:Homicidal Thoughts: No   Sensorium  Memory: Immediate Fair  Judgment: Poor  Insight: Fair   Chartered certified accountant: Fair  Attention Span: Fair  Recall: [employer] of Knowledge: Fair  Language: Fair   Psychomotor Activity  Psychomotor Activity: Psychomotor Activity: Normal   Assets  Assets: Desire for Improvement   Sleep  Sleep: Sleep: Fair Number of Hours of Sleep: 6   Nutritional Assessment (For OBS and FBC admissions only) Has the patient had a weight loss or gain of 10 pounds or more in the last 3 months?: No  Has the patient had a decrease in food intake/or appetite?: No Does the patient have dental problems?: No Does the patient have eating habits or behaviors that may be indicators of an eating disorder including binging or inducing vomiting?: No Has the patient recently lost weight without trying?: 0 Has the patient been eating poorly because of a decreased appetite?:  0 Malnutrition Screening Tool Score: 0    Physical Exam HENT:     Head: Normocephalic.     Nose: Nose normal.  Eyes:     Pupils: Pupils are equal, round, and reactive to light.  Cardiovascular:     Rate and Rhythm: Normal rate.  Pulmonary:     Effort: Pulmonary effort is normal.  Musculoskeletal:        General: Normal range of motion.     Cervical back: Normal range of motion.  Neurological:     General: No focal deficit present.     Mental Status: She is alert.  Psychiatric:        Mood and Affect: Mood normal.        Behavior: Behavior normal.        Thought Content: Thought content normal.        Judgment: Judgment normal.    Review of Systems  Constitutional: Negative.   HENT: Negative.    Eyes: Negative.   Respiratory: Negative.    Cardiovascular: Negative.   Gastrointestinal: Negative.   Genitourinary: Negative.   Musculoskeletal: Negative.   Skin: Negative.   Neurological: Negative.   Psychiatric/Behavioral:  Positive for suicidal ideas. The patient is nervous/anxious.     Blood pressure (!) 135/132, pulse 94, temperature 99.1 F (37.3 C), temperature source Oral, resp. rate 20, last menstrual period 02/25/2024, SpO2 98%. There is no height or weight on file to calculate BMI.  Past Psychiatric History: SI, anxiety, depression  Is the patient at risk to self? Yes  Has the patient been a risk to self in the past 6 months? Yes .    Has the patient been a risk to self within the distant past? Yes   Is the patient a risk to others? No   Has the patient been a risk to others in the past 6 months? No   Has the patient been a risk to others within the distant past? No   Past Medical History: See chart  Family History: Unknown  Social History: Denies  Last Labs:  Admission on 09/13/2024, Discharged on 09/14/2024  Component Date Value Ref Range Status   Color, Urine 09/14/2024 YELLOW  YELLOW Final   APPearance 09/14/2024 CLOUDY (A)  CLEAR Final    Specific Gravity, Urine 09/14/2024 1.013  1.005 - 1.030 Final   pH 09/14/2024 6.0  5.0 - 8.0 Final   Glucose, UA 09/14/2024 NEGATIVE  NEGATIVE mg/dL Final   Hgb urine dipstick 09/14/2024 NEGATIVE  NEGATIVE Final   Bilirubin Urine 09/14/2024 NEGATIVE  NEGATIVE Final   Ketones, ur 09/14/2024 NEGATIVE  NEGATIVE mg/dL Final   Protein, ur 89/83/7974 NEGATIVE  NEGATIVE mg/dL Final   Nitrite 89/83/7974 NEGATIVE  NEGATIVE Final   Leukocytes,Ua 09/14/2024 SMALL (A)  NEGATIVE Final   RBC / HPF 09/14/2024 0-5  0 - 5 RBC/hpf Final   WBC, UA 09/14/2024 6-10  0 - 5 WBC/hpf Final   Bacteria, UA 09/14/2024 FEW (A)  NONE SEEN Final   Squamous Epithelial / HPF 09/14/2024 21-50  0 - 5 /HPF Final   Mucus 09/14/2024 PRESENT   Final   Performed at  [facility], [address], [place], [place] [address]   POCT [place] Test 09/14/2024 Negative = intact amniotic membranes   Final   Yeast Wet Prep HPF POC 09/14/2024 NONE SEEN  NONE SEEN Final   Trich, Wet Prep 09/14/2024 NONE SEEN  NONE SEEN Final   Clue Cells Wet Prep HPF POC 09/14/2024 PRESENT (A)  NONE SEEN Final   WBC, Wet Prep HPF POC 09/14/2024 <10  <10 Final   Sperm 09/14/2024 NONE SEEN   Final   Performed at [facility], [address] [place], [place] [address]   Neisseria Gonorrhea 09/14/2024 Negative   Final   Chlamydia 09/14/2024 Negative   Final   Comment 09/14/2024 Normal Reference Ranger Chlamydia - Negative   Final   Comment 09/14/2024 Normal Reference Range Neisseria Gonorrhea - Negative   Final  Orders Only on 09/11/2024  Component Date Value Ref Range Status   Glucose, Fasting 09/11/2024 85  70 - 91 mg/dL Final   Glucose, 1 hour 09/11/2024 106  70 - 179 mg/dL Final   Glucose, 2 hour 09/11/2024 89  70 - 152 mg/dL Final  Appointment on 90/87/7974  Component Date Value Ref Range Status   Hgb A1c MFr Bld 08/11/2024 5.1  4.6 - 6.5 % Final   Glycemic Control Guidelines for People with Diabetes:Non Diabetic:  <6%Goal of Therapy:  <7%Additional Action Suggested:  >8%   Office Visit on 08/09/2024  Component Date Value Ref Range Status   WBC 08/09/2024 8.7  4.0 - 10.5 K/uL Final   RBC 08/09/2024 3.54 (L)  3.87 - 5.11 Mil/uL Final   Hemoglobin 08/09/2024 11.7 (L)  12.0 - 15.0 g/dL Final   HCT 90/89/7974 33.9 (L)  36.0 - 46.0 % Final   MCV 08/09/2024 95.6  78.0 - 100.0 fl Final   MCHC 08/09/2024 34.7  30.0 - 36.0 g/dL Final   RDW 90/89/7974 13.1  11.5 - 15.5 % Final   Platelets 08/09/2024 274.0  150.0 - 400.0 K/uL Final   Neutrophils Relative % 08/09/2024 65.1  43.0 - 77.0 % Final   Lymphocytes Relative 08/09/2024 26.3  12.0 - 46.0 % Final   Monocytes Relative 08/09/2024 5.7  3.0 - 12.0 % Final   Eosinophils Relative 08/09/2024 2.3  0.0 - 5.0 % Final   Basophils Relative 08/09/2024 0.6  0.0 - 3.0 % Final   Neutro Abs 08/09/2024 5.7  1.4 - 7.7 K/uL Final   Lymphs Abs 08/09/2024 2.3  0.7 - 4.0 K/uL Final   Monocytes Absolute 08/09/2024 0.5  0.1 - 1.0 K/uL Final   Eosinophils Absolute 08/09/2024 0.2  0.0 - 0.7 K/uL Final   Basophils Absolute 08/09/2024 0.0  0.0 - 0.1 K/uL Final   Sodium 08/09/2024 136  135 - 145 mEq/L Final   Potassium 08/09/2024 3.9  3.5 - 5.1 mEq/L Final   Chloride 08/09/2024 105  96 - 112 mEq/L Final   CO2 08/09/2024 23  19 - 32 mEq/L Final   Glucose, Bld 08/09/2024 90  70 - 99 mg/dL Final   BUN 90/89/7974 8  6 - 23 mg/dL Final   Creatinine, Ser 08/09/2024 0.54  0.40 - 1.20 mg/dL Final   Total Bilirubin 08/09/2024 0.2  0.2 - 1.2 mg/dL Final   Alkaline Phosphatase 08/09/2024 62  39 - 117 U/L Final   AST 08/09/2024 12  0 - 37 U/L Final   ALT 08/09/2024 11  0 - 35 U/L Final   Total Protein 08/09/2024 6.9  6.0 -  8.3 g/dL Final   Albumin 90/89/7974 3.5  3.5 - 5.2 g/dL Final   GFR 90/89/7974 129.12  >60.00 mL/min Final   Calculated using the CKD-EPI Creatinine Equation (2021)   Calcium  08/09/2024 9.0  8.4 - 10.5 mg/dL Final   Iron 90/89/7974 39 (L)  40 - 190 mcg/dL Final   TIBC 90/89/7974 421  250  - 450 mcg/dL (calc) Final   %SAT 90/89/7974 9 (L)  16 - 45 % (calc) Final   Ferritin 08/09/2024 15 (L)  16 - 154 ng/mL Final   TSH 08/09/2024 0.38  0.35 - 5.50 uIU/mL Final   Cholesterol 08/09/2024 173  0 - 200 mg/dL Final   ATP III Classification       Desirable:  < 200 mg/dL               Borderline High:  200 - 239 mg/dL          High:  > = 759 mg/dL   Triglycerides 90/89/7974 140.0  0.0 - 149.0 mg/dL Final   Normal:  <849 mg/dLBorderline High:  150 - 199 mg/dL   HDL 90/89/7974 37.69  >39.00 mg/dL Final   VLDL 90/89/7974 28.0  0.0 - 40.0 mg/dL Final   LDL Cholesterol 08/09/2024 82  0 - 99 mg/dL Final   Total CHOL/HDL Ratio 08/09/2024 3   Final                  Men          Women1/2 Average Risk     3.4          3.3Average Risk          5.0          4.42X Average Risk          9.6          7.13X Average Risk          15.0          11.0                       NonHDL 08/09/2024 110.46   Final   NOTE:  Non-HDL goal should be 30 mg/dL higher than patient's LDL goal (i.e. LDL goal of < 70 mg/dL, would have non-HDL goal of < 100 mg/dL)   QuantiFERON-TB Gold Plus 08/09/2024 NEGATIVE  NEGATIVE Final   Comment: Negative test result. M. tuberculosis complex  infection unlikely.    NIL 08/09/2024 0.03  IU/mL Final   Mitogen-NIL 08/09/2024 >10.00  IU/mL Final   TB1-NIL 08/09/2024 0.00  IU/mL Final   TB2-NIL 08/09/2024 <0.00  IU/mL Final   Comment: . The Nil tube value reflects the background interferon gamma immune response of the patient's blood sample. This value has been subtracted from the patient's displayed TB and Mitogen results. . Lower than expected results with the Mitogen tube prevent false-negative Quantiferon readings by detecting a patient with a potential immune suppressive condition and/or suboptimal pre-analytical specimen handling. . The TB1 Antigen tube is coated with the M. tuberculosis-specific antigens designed to elicit responses from TB antigen primed CD4+  helper T-lymphocytes. . The TB2 Antigen tube is coated with the M. tuberculosis-specific antigens designed to elicit responses from TB antigen primed CD4+ helper and CD8+ cytotoxic T-lymphocytes. . For additional information, please refer to https://education.questdiagnostics.com/faq/FAQ204 (This link is being provided for informational/ educational purposes only.) .   Office Visit on 07/05/2024  Component Date Value Ref Range Status  WBC 07/05/2024 9.7  4.0 - 10.5 K/uL Final   RBC 07/05/2024 3.76 (L)  3.87 - 5.11 Mil/uL Final   Hemoglobin 07/05/2024 12.1  12.0 - 15.0 g/dL Final   HCT 91/93/7974 35.6 (L)  36.0 - 46.0 % Final   MCV 07/05/2024 94.5  78.0 - 100.0 fl Final   MCHC 07/05/2024 33.9  30.0 - 36.0 g/dL Final   RDW 91/93/7974 13.3  11.5 - 15.5 % Final   Platelets 07/05/2024 297.0  150.0 - 400.0 K/uL Final   Neutrophils Relative % 07/05/2024 64.7  43.0 - 77.0 % Final   Lymphocytes Relative 07/05/2024 27.0  12.0 - 46.0 % Final   Monocytes Relative 07/05/2024 5.5  3.0 - 12.0 % Final   Eosinophils Relative 07/05/2024 1.7  0.0 - 5.0 % Final   Basophils Relative 07/05/2024 1.1  0.0 - 3.0 % Final   Neutro Abs 07/05/2024 6.3  1.4 - 7.7 K/uL Final   Lymphs Abs 07/05/2024 2.6  0.7 - 4.0 K/uL Final   Monocytes Absolute 07/05/2024 0.5  0.1 - 1.0 K/uL Final   Eosinophils Absolute 07/05/2024 0.2  0.0 - 0.7 K/uL Final   Basophils Absolute 07/05/2024 0.1  0.0 - 0.1 K/uL Final   Sodium 07/05/2024 132 (L)  135 - 145 mEq/L Final   Potassium 07/05/2024 3.9  3.5 - 5.1 mEq/L Final   Chloride 07/05/2024 101  96 - 112 mEq/L Final   CO2 07/05/2024 25  19 - 32 mEq/L Final   Glucose, Bld 07/05/2024 84  70 - 99 mg/dL Final   BUN 91/93/7974 6  6 - 23 mg/dL Final   Creatinine, Ser 07/05/2024 0.53  0.40 - 1.20 mg/dL Final   Total Bilirubin 07/05/2024 0.4  0.2 - 1.2 mg/dL Final   Alkaline Phosphatase 07/05/2024 56  39 - 117 U/L Final   AST 07/05/2024 10  0 - 37 U/L Final   ALT 07/05/2024 10  0 -  35 U/L Final   Total Protein 07/05/2024 7.0  6.0 - 8.3 g/dL Final   Albumin 91/93/7974 3.6  3.5 - 5.2 g/dL Final   GFR 91/93/7974 129.79  >60.00 mL/min Final   Calculated using the CKD-EPI Creatinine Equation (2021)   Calcium  07/05/2024 8.6  8.4 - 10.5 mg/dL Final  Admission on 92/73/7974, Discharged on 06/26/2024  Component Date Value Ref Range Status   SARS Coronavirus 2 by RT PCR 06/24/2024 NEGATIVE  NEGATIVE Final   Influenza A by PCR 06/24/2024 NEGATIVE  NEGATIVE Final   Influenza B by PCR 06/24/2024 NEGATIVE  NEGATIVE Final   Comment: (NOTE) The Xpert Xpress SARS-CoV-2/FLU/RSV plus assay is intended as an aid in the diagnosis of influenza from Nasopharyngeal swab specimens and should not be used as a sole basis for treatment. Nasal washings and aspirates are unacceptable for Xpert Xpress SARS-CoV-2/FLU/RSV testing.  Fact Sheet for Patients: BloggerCourse.com  Fact Sheet for Healthcare Providers: SeriousBroker.it  This test is not yet approved or cleared by the [place]  FDA and has been authorized for detection and/or diagnosis of SARS-CoV-2 by FDA under an Emergency Use Authorization (EUA). This EUA will remain in effect (meaning this test can be used) for the duration of the COVID-19 declaration under Section 564(b)(1) of the Act, 21 U.S.C. section 360bbb-3(b)(1), unless the authorization is terminated or revoked.     Resp Syncytial Virus by PCR 06/24/2024 NEGATIVE  NEGATIVE Final   Comment: (NOTE) Fact Sheet for Patients: BloggerCourse.com  Fact Sheet for Healthcare Providers: SeriousBroker.it  This test is not yet  approved or cleared by the [place]  FDA and has been authorized for detection and/or diagnosis of SARS-CoV-2 by FDA under an Emergency Use Authorization (EUA). This EUA will remain in effect (meaning this test can be used) for the duration of  the COVID-19 declaration under Section 564(b)(1) of the Act, 21 U.S.C. section 360bbb-3(b)(1), unless the authorization is terminated or revoked.  Performed at [facility], [address] [place], [place] [address]    Sodium 06/24/2024 133 (L)  135 - 145 mmol/L Final   Potassium 06/24/2024 3.5  3.5 - 5.1 mmol/L Final   Chloride 06/24/2024 109  98 - 111 mmol/L Final   CO2 06/24/2024 17 (L)  22 - 32 mmol/L Final   Glucose, Bld 06/24/2024 130 (H)  70 - 99 mg/dL Final   Glucose reference range applies only to samples taken after fasting for at least 8 hours.   BUN 06/24/2024 8  6 - 20 mg/dL Final   Creatinine, Ser 06/24/2024 0.51  0.44 - 1.00 mg/dL Final   Calcium  06/24/2024 7.9 (L)  8.9 - 10.3 mg/dL Final   GFR, Estimated 06/24/2024 >60  >60 mL/min Final   Comment: (NOTE) Calculated using the CKD-EPI Creatinine Equation (2021)    Anion gap 06/24/2024 7  5 - 15 Final   Performed at [facility], [address], [place], [place] [address]   WBC 06/24/2024 8.3  4.0 - 10.5 K/uL Final   RBC 06/24/2024 3.52 (L)  3.87 - 5.11 MIL/uL Final   Hemoglobin 06/24/2024 11.3 (L)  12.0 - 15.0 g/dL Final   HCT 92/73/7974 33.0 (L)  36.0 - 46.0 % Final   MCV 06/24/2024 93.8  80.0 - 100.0 fL Final   MCH 06/24/2024 32.1  26.0 - 34.0 pg Final   MCHC 06/24/2024 34.2  30.0 - 36.0 g/dL Final   RDW 92/73/7974 12.6  11.5 - 15.5 % Final   Platelets 06/24/2024 230  150 - 400 K/uL Final   nRBC 06/24/2024 0.0  0.0 - 0.2 % Final   Neutrophils Relative % 06/24/2024 64  % Final   Neutro Abs 06/24/2024 5.4  1.7 - 7.7 K/uL Final   Lymphocytes Relative 06/24/2024 27  % Final   Lymphs Abs 06/24/2024 2.2  0.7 - 4.0 K/uL Final   Monocytes Relative 06/24/2024 6  % Final   Monocytes Absolute 06/24/2024 0.5  0.1 - 1.0 K/uL Final   Eosinophils Relative 06/24/2024 1  % Final   Eosinophils Absolute 06/24/2024 0.1  0.0 - 0.5 K/uL Final   Basophils Relative 06/24/2024 1  % Final   Basophils Absolute 06/24/2024  0.0  0.0 - 0.1 K/uL Final   Immature Granulocytes 06/24/2024 1  % Final   Abs Immature Granulocytes 06/24/2024 0.04  0.00 - 0.07 K/uL Final   Performed at [facility], [address], [place], [place] [address]   Preg, Serum 06/24/2024 POSITIVE (A)  NEGATIVE Final   Comment:        THE SENSITIVITY OF THIS METHODOLOGY IS >10 mIU/mL. Performed at [facility], [address], [place], [place] [address]    WBC 06/24/2024 8.9  4.0 - 10.5 K/uL Final   RBC 06/24/2024 3.61 (L)  3.87 - 5.11 MIL/uL Final   Hemoglobin 06/24/2024 11.4 (L)  12.0 - 15.0 g/dL Final   HCT 92/73/7974 33.1 (L)  36.0 - 46.0 % Final   MCV 06/24/2024 91.7  80.0 - 100.0 fL Final   MCH 06/24/2024 31.6  26.0 - 34.0 pg Final   MCHC 06/24/2024 34.4  30.0 - 36.0 g/dL Final   RDW 92/73/7974 12.5  11.5 - 15.5 % Final   Platelets 06/24/2024 251  150 - 400 K/uL Final   nRBC 06/24/2024 0.0  0.0 - 0.2 % Final   Performed at [facility], [address] [place], [place] [address]   Creatinine, Ser 06/24/2024 0.70  0.44 - 1.00 mg/dL Final   GFR, Estimated 06/24/2024 >60  >60 mL/min Final   Comment: (NOTE) Calculated using the CKD-EPI Creatinine Equation (2021) Performed at [facility], [address] [place], [place] [address]    B Natriuretic Peptide 06/24/2024 23.8  0.0 - 100.0 pg/mL Final   Performed at [facility], [address] [place], [place] [address]   Sodium 06/25/2024 135  135 - 145 mmol/L Final   Potassium 06/25/2024 4.1  3.5 - 5.1 mmol/L Final   Chloride 06/25/2024 104  98 - 111 mmol/L Final   CO2 06/25/2024 19 (L)  22 - 32 mmol/L Final   Glucose, Bld 06/25/2024 116 (H)  70 - 99 mg/dL Final   Glucose reference range applies only to samples taken after fasting for at least 8 hours.   BUN 06/25/2024 9  6 - 20 mg/dL Final   Creatinine, Ser 06/25/2024 0.53  0.44 - 1.00 mg/dL Final   Calcium  06/25/2024 8.9  8.9 - 10.3 mg/dL Final   GFR, Estimated 06/25/2024 >60  >60 mL/min Final    Comment: (NOTE) Calculated using the CKD-EPI Creatinine Equation (2021)    Anion gap 06/25/2024 12  5 - 15 Final   Performed at [facility], [address], [place], [place] [address]   WBC 06/25/2024 12.4 (H)  4.0 - 10.5 K/uL Final   RBC 06/25/2024 3.60 (L)  3.87 - 5.11 MIL/uL Final   Hemoglobin 06/25/2024 11.5 (L)  12.0 - 15.0 g/dL Final   HCT 92/72/7974 33.0 (L)  36.0 - 46.0 % Final   MCV 06/25/2024 91.7  80.0 - 100.0 fL Final   MCH 06/25/2024 31.9  26.0 - 34.0 pg Final   MCHC 06/25/2024 34.8  30.0 - 36.0 g/dL Final   RDW 92/72/7974 12.4  11.5 - 15.5 % Final   Platelets 06/25/2024 273  150 - 400 K/uL Final   nRBC 06/25/2024 0.0  0.0 - 0.2 % Final   Performed at [facility], [address], [place], [place] [address]   WBC 06/26/2024 11.6 (H)  4.0 - 10.5 K/uL Final   RBC 06/26/2024 3.58 (L)  3.87 - 5.11 MIL/uL Final   Hemoglobin 06/26/2024 11.5 (L)  12.0 - 15.0 g/dL Final   HCT 92/71/7974 33.2 (L)  36.0 - 46.0 % Final   MCV 06/26/2024 92.7  80.0 - 100.0 fL Final   MCH 06/26/2024 32.1  26.0 - 34.0 pg Final   MCHC 06/26/2024 34.6  30.0 - 36.0 g/dL Final   RDW 92/71/7974 12.4  11.5 - 15.5 % Final   Platelets 06/26/2024 267  150 - 400 K/uL Final   nRBC 06/26/2024 0.0  0.0 - 0.2 % Final   Performed at [facility], [address], [place], [place] [address]   Sodium 06/26/2024 134 (L)  135 - 145 mmol/L Final   Potassium 06/26/2024 3.8  3.5 - 5.1 mmol/L Final   Chloride 06/26/2024 101  98 - 111 mmol/L Final   CO2 06/26/2024 22  22 - 32 mmol/L Final  Glucose, Bld 06/26/2024 101 (H)  70 - 99 mg/dL Final   Glucose reference range applies only to samples taken after fasting for at least 8 hours.   BUN 06/26/2024 9  6 - 20 mg/dL Final   Creatinine, Ser 06/26/2024 0.63  0.44 - 1.00 mg/dL Final   Calcium  06/26/2024 9.0  8.9 - 10.3 mg/dL Final   GFR, Estimated 06/26/2024 >60  >60 mL/min Final   Comment: (NOTE) Calculated using the CKD-EPI Creatinine Equation (2021)     Anion gap 06/26/2024 11  5 - 15 Final   Performed at [facility], [address], [place], [place] [address]  Initial Prenatal on 06/23/2024  Component Date Value Ref Range Status   Results 06/23/2024 Report   Final   Test Results: 06/23/2024 *Screen Negative*   Final   Gest. Age on Collection Date 06/23/2024 16.0  weeks Final   Gestat. Age Based On 06/23/2024 EDD   Final   Comment: Recalculations are not recommended when gestational dating by LMP and ultrasound are within 10 days.    Maternal Age At EDD 06/23/2024 24.4  yr Final   Race 06/23/2024 Black   Final   Weight 06/23/2024 326  lbs Final   Insulin  Dep Diabetes 06/23/2024 No   Final   Multiple Gestation 06/23/2024 No   Final   AFP Value 06/23/2024 20.0  ng/mL Final   AFP MoM 06/23/2024 0.85   Final   OSBR Risk 1 IN 06/23/2024 10,000   Final   Interpretation 06/23/2024 Comment   Final   Comment: Interpretation: Screen Negative This result is screen negative for fetal OSB. The AFP MoM calculated is based on the gestational age provided. MS-AFP can identify up to 80% of open fetal neural tube defects. Closed neural tube defects and some open defects may not be detected by this test. This test does not screen for fetal Down Syndrome or Trisomy 18. If screening for Down Syndrome or Trisomy 18 is desired, Pharmacist, hospital to discuss available options.  The [employer] recommends amniocentesis be offered to women age 55 and older.    Comment: 06/23/2024 Comment   Final   Comment: Lacinda MICAEL Marc, Ph.D., [facility] Director References: Available Upon Request. Multiples Of Median Cutoffs         For AFP Elevations Singleton   2.5           Black    2.8 IDD         2.0           Twins    4.5         Abbreviation Definitions IDD - Insulin  Dep Diabetes OSBR - Open Spina Bifida Risk For further inquiries contact [facility] at [phone]. This test  was developed and its performance characteristics determined by [facility]. It has not been cleared or approved by the Food and Drug Administration.    Adequacy 06/23/2024 Satisfactory for evaluation.  The absence of an   Final   Adequacy 06/23/2024 endocervical/transformation zone component is not uncommon in pregnant   Final   Adequacy 06/23/2024 patients.   Final   Diagnosis 06/23/2024 - Low grade squamous intraepithelial lesion (LSIL) (A)   Final  Clinical Support on 05/22/2024  Component Date Value Ref Range Status   ABO Grouping 05/22/2024 O   Final   Rh Factor 05/22/2024 Positive   Final   Comment: Please note: Prior records for this patient's ABO / Rh type  are not available for additional verification.    Antibody Screen 05/22/2024 Negative  Negative Final   WBC 05/22/2024 7.0  3.4 - 10.8 x10E3/uL Final   RBC 05/22/2024 3.47 (L)  3.77 - 5.28 x10E6/uL Final   Hemoglobin 05/22/2024 11.1  11.1 - 15.9 g/dL Final   Hematocrit 93/76/7974 34.2  34.0 - 46.6 % Final   MCV 05/22/2024 99 (H)  79 - 97 fL Final   MCH 05/22/2024 32.0  26.6 - 33.0 pg Final   MCHC 05/22/2024 32.5  31.5 - 35.7 g/dL Final   RDW 93/76/7974 12.9  11.7 - 15.4 % Final   Platelets 05/22/2024 289  150 - 450 x10E3/uL Final   Hepatitis B Surface Ag 05/22/2024 Negative  Negative Final   HIV Screen 4th Generation wRfx 05/22/2024 Non Reactive  Non Reactive Final   Comment: HIV-1/HIV-2 antibodies and HIV-1 p24 antigen were NOT detected. There is no laboratory evidence of HIV infection. HIV Negative    RPR Ser Ql 05/22/2024 Non Reactive  Non Reactive Final   Interpretation: 05/22/2024 Comment   Final   Comment: Syphilis: RPR with Reflex to RPR Titer and Treponemal           Antibodies, Traditional Screening and Diagnosis           Algorithm ------------------------------------------------------------                        Treponemal RPR        RPR, Qn         Ab       Final Interpretation --------   ---------    ----------   ------------------------ Non        N/A         N/A          No laboratory evidence Reactive                            of syphilis. Retest in                                     2-4 weeks if recent                                     exposure us  suspected. --------   ---------   ----------   ------------------------ Reactive   >/=1:1      Non          Nontreponemal antibodies                        Reactive     detected. Syphilis                                     unlikely; biological                                     false positive possible.  Retest in 2-4 weeks if                                     recent exposure                           is                                     suspected. --------   ---------   ----------   ------------------------ Reactive   >/=1:1      Reactive     Treponemal and                                     nontreponemal antibodies                                     detected. Consistent                                     with past or current                                     (potential early)                                     syphilis.    Rubella Antibodies, IGG 05/22/2024 2.05  Immune >0.99 index Final   Comment:                                 Non-immune       <0.90                                 Equivocal  0.90 - 0.99                                 Immune           >0.99    Hep C Virus Ab 05/22/2024 Non Reactive  Non Reactive Final   Comment: HCV antibody alone does not differentiate between previously resolved infection and active infection. Equivocal and Reactive HCV antibody results should be followed up with an HCV RNA test to support the diagnosis of active HCV infection.    Hgb A1c MFr Bld 05/22/2024 5.0  4.8 - 5.6 % Final   Comment:          Prediabetes: 5.7 - 6.4          Diabetes: >6.4          Glycemic control for adults with diabetes: <7.0    Est. average glucose Bld gHb  Est-m* 05/22/2024 97  mg/dL Final   Urine Culture, OB 05/22/2024 Final report   Final  Neisseria Gonorrhea 05/22/2024 Negative   Final   Chlamydia 05/22/2024 Negative   Final   Trichomonas 05/22/2024 Negative   Final   Comment 05/22/2024 Normal Reference Range Trichomonas - Negative   Final   Comment 05/22/2024 Normal Reference Ranger Chlamydia - Negative   Final   Comment 05/22/2024 Normal Reference Range Neisseria Gonorrhea - Negative   Final   REPORT SUMMARY 05/22/2024 LOW RISK   Final   LOW RISK   REPORT NOTE 05/22/2024 See Notes   Final   GENDER OF FETUS 05/22/2024 Female   Final   FETAL FRACTION 05/22/2024 4.3%   Final   TRISOMY 21 RESULT TEXT 05/22/2024 Low Risk   Final   TRISOMY 21 AGE-BASED RISK TEXT 05/22/2024 1/1,068 (0.09%)   Final   TRISOMY 21 RISK SCORE TEXT 05/22/2024 <1/10,000 (<0.01%)   Final   TRISOMY 18 RESULT TEXT 05/22/2024 Low Risk   Final   TRISOMY 18 AGE-BASED RISK TEXT 05/22/2024 1/2,484 (0.04%)   Final   TRISOMY 18 RISK SCORE TEXT 05/22/2024 <1/10,000 (<0.01%)   Final   TRISOMY 13 RESULT TEXT 05/22/2024 Low Risk   Final   TRISOMY 13 AGE-BASED RISK TEXT 05/22/2024 1/7,826 (0.01%)   Final   TRISOMY 13 RISK SCORE TEXT 05/22/2024 <1/10,000 (<0.01%)   Final   MONOSOMY X RESULT TEXT 05/22/2024 Low Risk   Final   MONOSOMY X AGE-BASED RISK TEXT 05/22/2024 1/255 (0.39%)   Final   MONOSOMY X RISK SCORE TEXT 05/22/2024 <1/10,000 (<0.01%)   Final   TRIPLOIDY RESULT TEXT 05/22/2024 Low Risk   Final   FOOTNOTES 05/22/2024 See Notes   Final   Comment: Testing Methodology DNA isolated from maternal blood, which contains placental DNA, is amplified at specific loci using a targeted PCR assay and is sequenced using a high-throughput sequencer. Fetal fraction is determined using a proprietary algorithm incorporating data from single nucleotide polymorphism-based (SNP-based) next-generation sequencing [Pergament E et al. Obstet Gynecol. 2014 Aug;124(2 Pt 1):210-8]. If there is  sufficient fetal fraction, sequencing data is analyzed using a proprietary SNP-based algorithm to determine the fetal copy number for chromosomes 13, 18, 21, X and Y. If ordered, specific microdeletions will be evaluated using similar methodology [Wapner RJ et al. Am J Obstet Gynecol. 2015 Mar;212(3):332.e1-9]. If the fetal fraction is insufficient, an additional algorithm to determine whether there is an increased risk for triploidy, trisomy 62, and trisomy 13 may be utilized, known as fetal fraction based risk assessment (FFBR) [McKanna et al. Ultrasound Obstet                           Gynecol 2019;  53:73-79]. If ordered on a vanishing twin pregnancy, a proprietary analysis will be performed to differentiate between the viable/living fetus and the vanished fetus to allow for risk assessment of fetal copy number of chromosomes 13,18, 21, X, Y, and specific microdeletions in the viable/living twin using the above described SNP-based algorithm. If ordered, and pregnant patient is RHD negative by genotype, fetal RHD status will be evaluated using a proprietary algorithm if fetal fraction is sufficient [Gilstrop Thompson et al. Obstet Gynecol 2024;145:1?7]. However, some samples will not produce a result due to failure to meet the necessary quality thresholds. This test has been validated on women with a singleton, twin, vanishing twin, or egg donor pregnancy of at least nine weeks gestation. A result will not be available for higher order multiples and multiple gestation pregnancies with an egg donor or surrogate, or  bone marrow transplant recipients. Complete test                           panel is not available for twin gestations and pregnancies achieved with an egg donor or surrogate. For twin pregnancies with a fetal fraction value below the threshold for analysis, a sum of the fetal fractions for both twins will be reported. As this assay is a screening test and not diagnostic, false positives and false  negatives can occur. High risk test results need diagnostic confirmation by alternative testing methods. Low risk results do not fully exclude the diagnosis of any of the syndromes nor do they exclude the possibility of other chromosomal abnormalities or birth defects, which are not a part of this test. Potential sources of inaccurate results include, but are  not limited to, mosaicism, low fetal fraction, limitations of current diagnostic techniques, or misidentification of samples. This test will not identify all deletions associated with each microdeletion syndrome. This test has been validated for deletions ?0.5 Mb within the 22q11.2 A-D region. This test has                           been validated on full region deletions only for 1p36 deletion syndrome, Cri-du-chat syndrome, Prader Willi syndrome and Angelman syndrome and may be unable to detect smaller deletions. Microdeletion risk score may be dependent upon fetal fraction, as deletions on the maternally inherited copy are difficult to identify at lower fetal fractions. Test results should always be interpreted by a clinician in the context of clinical and familial data with the availability of genetic counseling when appropriate. ---------------- Disclaimers The extraction, Designer, television/film set, and sequencing of this test were performed by [facility] [address], [place], [place] [address] (CLIA [place] [id number]). The data analysis and reporting of this test were performed by [facility] [address], [place] [address] (CLIA [place] [id number]). The performance characteristics of this test were developed by [facility]CLIA ID [id number]). This test has not been cleared or                           approved by the U.S. Food and Drug Administration (FDA). These laboratories are regulated under CLIA as qualified to perform high-complexity testing.  2025 [employer]. All Rights Reserved. Please refer to the attached PDF  report Reviewed By: Darolyn Cummins, M.D., Ph.D., [facility], Senior Laboratory Director CLIA Lab Director: DOROTHA Lawrnce Crosby, Ph.D., [facility] IF THE ORDERING PROVIDER HAS QUESTIONS OR WISHES TO DISCUSS THE RESULTS, PLEASE CONTACT US  AT [phone]. Ask for the NIPT genetic counselor on call.    REPORT SUMMARY 05/22/2024 Negative   Final   Negative for 4 out of 4 diseases. Alpha-Thalassemia : Negative Beta-Hemoglobinopathies : Negative Cystic Fibrosis : Negative Spinal Muscular Atrophy : Negative NEGATIVE Spinal Muscular Atrophy (SMA) Results SMN1: >/= 3 copies; g.27134T>G: present; the g.27134T>G variant does not modify carrier risk in individuals who carry 3 or more copies of SMN1.   PANEL NAME 05/22/2024 HCS_OTHER   Final   PANEL NOTES 05/22/2024 See Notes   Final   REPORT NOTE 05/22/2024 See Notes   Final   FOOTNOTES 05/22/2024 See Notes   Final   Please see the attached PDF for information regarding Conditions, Methodology, Disclaimers, and further information. Test performed by [facility] : [address]  [address], [place], [place] [address] CLIA ID #[id number] CLIA Lab Director: DOROTHA Lawrnce Crosby, Ph.D., [facility]   Creatinine, Urine 05/22/2024 118.2  Not Estab. mg/dL Final   Protein, Ur 93/76/7974 7.2  Not Estab. mg/dL Final   Protein/Creat Ratio 05/22/2024 61  0 - 200 mg/g creat Final   Glucose 05/22/2024 85  70 - 99 mg/dL Final   BUN 93/76/7974 9  6 - 20 mg/dL Final   Creatinine, Ser 05/22/2024 0.64  0.57 - 1.00 mg/dL Final   eGFR 93/76/7974 127  >59 mL/min/1.73 Final   BUN/Creatinine Ratio 05/22/2024 14  9 - 23 Final   Sodium 05/22/2024 135  134 - 144 mmol/L Final   Potassium 05/22/2024 4.0  3.5 - 5.2 mmol/L Final   Chloride 05/22/2024 102  96 - 106 mmol/L Final   CO2 05/22/2024 19 (L)  20 - 29 mmol/L Final   Calcium  05/22/2024 8.7  8.7 - 10.2 mg/dL Final   Total Protein 93/76/7974 6.4  6.0 - 8.5 g/dL Final   Albumin 93/76/7974 3.8 (L)  4.0 - 5.0 g/dL Final   Globulin,  Total 05/22/2024 2.6  1.5 - 4.5 g/dL Final   Bilirubin Total 05/22/2024 <0.2  0.0 - 1.2 mg/dL Final   Alkaline Phosphatase 05/22/2024 73  44 - 121 IU/L Final   AST 05/22/2024 8  0 - 40 IU/L Final   ALT 05/22/2024 9  0 - 32 IU/L Final   TSH 05/22/2024 0.505  0.450 - 4.500 uIU/mL Final   T4, Total 05/22/2024 11.0  4.5 - 12.0 ug/dL Final   T3 Uptake Ratio 05/22/2024 16 (L)  24 - 39 % Final   Free Thyroxine Index 05/22/2024 1.8  1.2 - 4.9 Final   Organism ID, Bacteria 05/22/2024 Comment   Final   Comment: Mixed urogenital flora 25,000-50,000 colony forming units per mL     Allergies: Other  Medications:  PTA Medications  Medication Sig   acetaminophen  (TYLENOL ) 500 MG tablet Take 1 tablet (500 mg total) by mouth every 6 (six) hours as needed for headache.   albuterol  (VENTOLIN  HFA) 108 (90 Base) MCG/ACT inhaler Inhale 2 puffs into the lungs every 4 (four) hours as needed for wheezing or shortness of breath.   Blood Pressure Monitoring (BLOOD PRESSURE KIT) DEVI Check BP daily   ondansetron  (ZOFRAN ) 4 MG tablet Take 1 tablet (4 mg total) by mouth every 8 (eight) hours as needed for nausea or vomiting.   prenatal vitamin w/FE, FA (PRENATAL 1 + 1) 27-1 MG TABS tablet Take 1 tablet by mouth daily at 12 noon.   busPIRone  (BUSPAR ) 10 MG tablet Take 1 tablet (10 mg total) by mouth 3 (three) times daily as needed.   aspirin  EC 81 MG tablet Take 1 tablet (81 mg total) by mouth daily. Swallow whole.   albuterol  (PROVENTIL ) (2.5 MG/3ML) 0.083% nebulizer solution Take 3 mLs (2.5 mg total) by nebulization every 4 (four) hours as needed for wheezing or shortness of breath.   budesonide  (PULMICORT ) 0.25 MG/2ML nebulizer solution Take 2 mLs (0.25 mg total) by nebulization 2 (two) times daily.   metroNIDAZOLE (FLAGYL) 500 MG tablet Take 1 tablet (500 mg total) by mouth 2 (two) times daily for 7 days.      Medical Decision Making  Observation unit    Recommendations  Based on my evaluation the  patient does not appear to have an emergency medical condition.  Gaither Pouch, NP 09/15/24  12:02 AM

## 2024-09-15 DIAGNOSIS — Z3A27 27 weeks gestation of pregnancy: Secondary | ICD-10-CM | POA: Diagnosis not present

## 2024-09-15 DIAGNOSIS — Z59868 Other specified financial insecurity: Secondary | ICD-10-CM | POA: Diagnosis not present

## 2024-09-15 DIAGNOSIS — N898 Other specified noninflammatory disorders of vagina: Secondary | ICD-10-CM | POA: Diagnosis not present

## 2024-09-15 DIAGNOSIS — O26892 Other specified pregnancy related conditions, second trimester: Secondary | ICD-10-CM | POA: Diagnosis not present

## 2024-09-15 LAB — CBC WITH DIFFERENTIAL/PLATELET
Abs Immature Granulocytes: 0.05 K/uL (ref 0.00–0.07)
Basophils Absolute: 0.1 K/uL (ref 0.0–0.1)
Basophils Relative: 1 %
Eosinophils Absolute: 0.2 K/uL (ref 0.0–0.5)
Eosinophils Relative: 2 %
HCT: 31.4 % — ABNORMAL LOW (ref 36.0–46.0)
Hemoglobin: 11.1 g/dL — ABNORMAL LOW (ref 12.0–15.0)
Immature Granulocytes: 1 %
Lymphocytes Relative: 28 %
Lymphs Abs: 2.8 K/uL (ref 0.7–4.0)
MCH: 32.8 pg (ref 26.0–34.0)
MCHC: 35.4 g/dL (ref 30.0–36.0)
MCV: 92.9 fL (ref 80.0–100.0)
Monocytes Absolute: 0.5 K/uL (ref 0.1–1.0)
Monocytes Relative: 5 %
Neutro Abs: 6.3 K/uL (ref 1.7–7.7)
Neutrophils Relative %: 63 %
Platelets: 294 K/uL (ref 150–400)
RBC: 3.38 MIL/uL — ABNORMAL LOW (ref 3.87–5.11)
RDW: 12.9 % (ref 11.5–15.5)
WBC: 9.9 K/uL (ref 4.0–10.5)
nRBC: 0 % (ref 0.0–0.2)

## 2024-09-15 LAB — CULTURE, OB URINE

## 2024-09-15 LAB — COMPREHENSIVE METABOLIC PANEL WITH GFR
ALT: 14 U/L (ref 0–44)
AST: 13 U/L — ABNORMAL LOW (ref 15–41)
Albumin: 2.7 g/dL — ABNORMAL LOW (ref 3.5–5.0)
Alkaline Phosphatase: 73 U/L (ref 38–126)
Anion gap: 12 (ref 5–15)
BUN: 5 mg/dL — ABNORMAL LOW (ref 6–20)
CO2: 18 mmol/L — ABNORMAL LOW (ref 22–32)
Calcium: 8.5 mg/dL — ABNORMAL LOW (ref 8.9–10.3)
Chloride: 105 mmol/L (ref 98–111)
Creatinine, Ser: 0.57 mg/dL (ref 0.44–1.00)
GFR, Estimated: >60 mL/min (ref >60)
Glucose, Bld: 91 mg/dL (ref 70–99)
Potassium: 3.9 mmol/L (ref 3.5–5.1)
Sodium: 135 mmol/L (ref 135–145)
Total Bilirubin: 0.4 mg/dL (ref 0.0–1.2)
Total Protein: 5.9 g/dL — ABNORMAL LOW (ref 6.5–8.1)

## 2024-09-15 LAB — ETHANOL: Alcohol, Ethyl (B): <15 mg/dL (ref <15)

## 2024-09-15 LAB — TSH: TSH: 0.888 u[IU]/mL (ref 0.350–4.500)

## 2024-09-15 MED ORDER — ACETAMINOPHEN 325 MG PO TABS: 650.0000 mg | ORAL_TABLET | Freq: Four times a day (QID) | ORAL | Status: DC | PRN

## 2024-09-15 MED ORDER — ALUM & MAG HYDROXIDE-SIMETH 200-200-20 MG/5ML PO SUSP: 30.0000 mL | ORAL | Status: DC | PRN

## 2024-09-15 MED ORDER — MAGNESIUM HYDROXIDE 400 MG/5ML PO SUSP: 30.0000 mL | Freq: Every day | ORAL | Status: DC | PRN

## 2024-09-15 MED ORDER — ALBUTEROL SULFATE HFA 108 (90 BASE) MCG/ACT IN AERS
2.0000 | INHALATION_SPRAY | RESPIRATORY_TRACT | Status: DC | PRN
  Administered 2024-09-15 (×2): 2 via RESPIRATORY_TRACT
  Filled 2024-09-15: qty 6.7

## 2024-09-15 MED ORDER — ALBUTEROL SULFATE (2.5 MG/3ML) 0.083% IN NEBU: 2.5000 mg | INHALATION_SOLUTION | RESPIRATORY_TRACT | Status: DC | PRN
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
## 2024-09-15 NOTE — ED Notes (Signed)
 Patient presented to [facility] voluntarily extremely emotional. RN spoke with patient to help try and get her BP to decrease. Patient concerned about current state of her pregnancy, home life, getting back to work. RN reassured patient, offered nutrition, quiet environment and active listening.
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
## 2024-09-15 NOTE — Discharge Instructions (Signed)
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
## 2024-09-15 NOTE — ED Notes (Addendum)
 Pt awake and alert x 4.  She has sad mood and congruent affect. Reports that she is having problems with her sister and her mother overheard her making an offhand statement. Pt is currently 26  weeks preg.  .  Denies SI, HI or AVH. Q 15 min checks for safety in place.

## 2024-09-18 ENCOUNTER — Encounter (HOSPITAL_BASED_OUTPATIENT_CLINIC_OR_DEPARTMENT_OTHER): Payer: Self-pay | Admitting: Obstetrics & Gynecology

## 2024-09-18 ENCOUNTER — Other Ambulatory Visit (HOSPITAL_BASED_OUTPATIENT_CLINIC_OR_DEPARTMENT_OTHER)

## 2024-09-18 NOTE — Progress Notes (Deleted)
    Whitney Beard [facility] [address] [place] [address] Phone: [phone]   Assessment and Plan:     ***    Pertinent previous records reviewed include ***   Follow Up: ***     Subjective:   I, Whitney Beard, am serving as a Neurosurgeon for Doctor Morene Mace  Chief Complaint: low back pain   HPI:   09/19/2024 Patient is a 23 year old female with low back pain. Patient states   Relevant Historical Information: ***  Additional pertinent review of systems negative.   Current Outpatient Medications:    albuterol  (PROVENTIL ) (2.5 MG/3ML) 0.083% nebulizer solution, Take 3 mLs (2.5 mg total) by nebulization every 4 (four) hours as needed for wheezing or shortness of breath., Disp: 75 mL, Rfl: 2   albuterol  (VENTOLIN  HFA) 108 (90 Base) MCG/ACT inhaler, Inhale 2 puffs into the lungs every 4 (four) hours as needed for wheezing or shortness of breath., Disp: 18 g, Rfl: 1   aspirin  EC 81 MG tablet, Take 1 tablet (81 mg total) by mouth daily. Swallow whole., Disp: , Rfl:    Blood Pressure Monitoring (BLOOD PRESSURE KIT) DEVI, Check BP daily, Disp: 1 each, Rfl: 0   [Paused] busPIRone  (BUSPAR ) 10 MG tablet, Take 1 tablet (10 mg total) by mouth 3 (three) times daily as needed. (Patient taking differently: Take 10 mg by mouth 3 (three) times daily as needed (For anxiety).), Disp: 30 tablet, Rfl: 0   metroNIDAZOLE (FLAGYL) 500 MG tablet, Take 1 tablet (500 mg total) by mouth 2 (two) times daily for 7 days., Disp: 14 tablet, Rfl: 0   ondansetron  (ZOFRAN ) 4 MG tablet, Take 1 tablet (4 mg total) by mouth every 8 (eight) hours as needed for nausea or vomiting., Disp: 30 tablet, Rfl: 3   prenatal vitamin w/FE, FA (PRENATAL 1 + 1) 27-1 MG TABS tablet, Take 1 tablet by mouth daily at 12 noon. (Patient taking differently: Take 1 tablet by mouth daily.), Disp: 30 tablet, Rfl: 12   Objective:     There were no vitals filed for this visit.    There  is no height or weight on file to calculate BMI.    Physical Exam:    ***   Electronically signed by:  Odis Mace D.CLEMENTEEN Beard [facility] 12:28 PM 09/18/24

## 2024-09-19 ENCOUNTER — Ambulatory Visit: Payer: MEDICAID | Admitting: Sports Medicine

## 2024-09-20 ENCOUNTER — Other Ambulatory Visit: Payer: MEDICAID

## 2024-09-25 ENCOUNTER — Ambulatory Visit: Payer: MEDICAID | Admitting: Family Medicine

## 2024-09-25 ENCOUNTER — Ambulatory Visit (HOSPITAL_BASED_OUTPATIENT_CLINIC_OR_DEPARTMENT_OTHER): Payer: MEDICAID | Admitting: Certified Nurse Midwife

## 2024-09-25 VITALS — BP 104/64 | HR 107 | Wt 348.6 lb

## 2024-09-25 DIAGNOSIS — J4551 Severe persistent asthma with (acute) exacerbation: Secondary | ICD-10-CM

## 2024-09-25 DIAGNOSIS — Z3402 Encounter for supervision of normal first pregnancy, second trimester: Secondary | ICD-10-CM

## 2024-09-25 DIAGNOSIS — O99513 Diseases of the respiratory system complicating pregnancy, third trimester: Secondary | ICD-10-CM

## 2024-09-25 DIAGNOSIS — Z3A29 29 weeks gestation of pregnancy: Secondary | ICD-10-CM

## 2024-09-25 DIAGNOSIS — J45909 Unspecified asthma, uncomplicated: Secondary | ICD-10-CM

## 2024-09-25 DIAGNOSIS — O99213 Obesity complicating pregnancy, third trimester: Secondary | ICD-10-CM

## 2024-09-25 DIAGNOSIS — J4550 Severe persistent asthma, uncomplicated: Secondary | ICD-10-CM

## 2024-09-25 NOTE — Progress Notes (Deleted)
   LILLETTE Ileana Collet, PhD, LAT, ATC acting as a scribe for Artist Lloyd, MD.  Geroge Poster Thaker is a 23 y.o. female who presents to [facility] today for LBP x ***. Pt is currently ***29-wks pregnant. Pt locates pain to ***  Radiating pain: LE numbness/tingling: LE weakness: Aggravates: Treatments tried:  Pertinent review of systems: ***  Relevant historical information: ***   Exam:  LMP 02/25/2024 (Approximate)  General: Well Developed, well nourished, and in no acute distress.   MSK: ***    Lab and Radiology Results No results found for this or any previous visit (from the past 72 hours). No results found.     Assessment and Plan: 23 y.o. female with ***   PDMP not reviewed this encounter. No orders of the defined types were placed in this encounter.  No orders of the defined types were placed in this encounter.    Discussed warning signs or symptoms. Please see discharge instructions. Patient expresses understanding.   ***

## 2024-09-25 NOTE — Progress Notes (Unsigned)
 PRENATAL VISIT NOTE  Subjective:  Whitney Beard is a 23 y.o. G1P0 at [redacted]w[redacted]d being seen today for ongoing prenatal care.  She is currently monitored for the following issues for this high-risk pregnancy and has OSA (obstructive sleep apnea); Class 3 severe obesity due to excess calories with serious comorbidity and body mass index (BMI) of 50.0 to 59.9 in adult [facility]); Major depressive disorder; Severe bipolar I disorder, current or most recent episode depressed (HCC); Anxiety disorder, unspecified; Cannabis use disorder, mild, abuse; Borderline personality disorder (HCC); Severe persistent allergic asthma (HCC); Encounter for supervision of normal first pregnancy in second trimester; Maternal morbid obesity, antepartum (HCC); Asthma during pregnancy; H/O suicide attempt; History of prediabetes; Asthma exacerbation; LGSIL on Pap smear of cervix; and Family history of albinism on their problem list.  Patient reports active fetal movement. Denies cramping, contractions or LOF from vagina. Pt was recently hospitalized x one week (discharged home yesterday) after Asthma Exacerbation. Pt states she is taking steroids as directed, Symbicort  as directed, and has Albuterhol inhaler 2puffs prn. O2 Sat 100%.  Contractions: Not present. Vag. Bleeding: None.  Movement: Present. Denies leaking of fluid.   The following portions of the patient's history were reviewed and updated as appropriate: allergies, current medications, past family history, past medical history, past social history, past surgical history and problem list.   Objective:    Vitals:   09/25/24 1055  BP: 104/64  Pulse: (!) 107  Weight: (!) 348 lb 9.6 oz (158.1 kg)    Fetal Status:      Movement: Present    General: Alert, oriented and cooperative. Patient is in no acute distress.  Skin: Skin is warm and dry. No rash noted.   Cardiovascular: Normal heart rate noted  Respiratory: Normal respiratory effort, no problems with respiration  noted  Abdomen: Soft, gravid, appropriate for gestational age.  Pain/Pressure: Present     Pelvic: Cervical exam deferred        Extremities: Normal range of motion.  Edema: None  Mental Status: Normal mood and affect. Normal behavior. Normal judgment and thought content.   Assessment and Plan:  Pregnancy: G1P0 at [redacted]w[redacted]d  1. [redacted] weeks gestation of pregnancy - on PNV and baby ASA - Pt passed 2hr GTT - Recheck in 2 weeks and prn - Recheck CBC at next prenatal visit - Pt has US  appointment follow-up 10/04/24   2. Encounter for supervision of normal first pregnancy in third trimester (Primary)   3. Asthma during pregnancy - Discharged home 09/24/24 after 1 week hospital stay due to asthma exacerbation - Pt reports using steroids, Symbicort , Albuterol  as directed   4. OSA (obstructive sleep apnea) - borderline.  Not prescribed CPAP. - has pulmonologist, Dr. Neda   5. LGSIL on Pap smear of cervix - planning to repeat PP   6. Severe bipolar I disorder, current or most recent episode depressed (HCC) - Pt doing well emotionally at this time. Has good support system.   7. History of depression  8. Maternal morbid obesity, antepartum (HCC)  - PreGravid BMI 50 - Pt has MFM US  follow-up scheduled - Most recent HgA1C 05/22/24  5.0 (Normal) - Baseline PreE labs completed - Thyroid  Panel With TSH (within normal ranges)   Preterm labor symptoms and general obstetric precautions including but not limited to vaginal bleeding, contractions, leaking of fluid and fetal movement were reviewed in detail with the patient. Please refer to After Visit Summary for other counseling recommendations.   No follow-ups  on file.  Future Appointments  Date Time Provider Department Center  09/25/2024  1:30 PM Joane Artist RAMAN, MD [facility] None  10/04/2024  1:15 PM [facility] PROVIDER 1 [facility]  10/04/2024  1:30 PM [facility] US2 [facility]  10/16/2024  1:55 PM Nusayba Cadenas, Arland POUR, CNM [facility] [address]   10/18/2024 10:30 AM Collene, Reginia BRAVO, PA [facility] None  10/31/2024  1:15 PM Sunaina Ferrando, Arland POUR, CNM [facility] [address]  11/13/2024  2:00 PM Neda Jennet LABOR, MD [facility] [address]  11/14/2024  1:15 PM Yaneliz Radebaugh, Arland POUR, CNM [facility] [address]  11/28/2024  1:15 PM Gari Hartsell, Arland POUR, CNM [facility] [address]  12/04/2024  9:55 AM Sakai Wolford, Arland POUR, CNM [facility] [address]  12/11/2024  1:15 PM Mamie Diiorio, Arland POUR, CNM [facility] [address]  12/18/2024  1:55 PM Kesean Serviss, Arland POUR, CNM [facility] [phone] [address]    Arland POUR Roller, CNM

## 2024-09-26 ENCOUNTER — Ambulatory Visit: Payer: Self-pay

## 2024-09-26 NOTE — Telephone Encounter (Signed)
 Patient called with concerns of bruising to her abdomen post hospitalization. Patient was hospitalized for 7 days and receiving subcutaneous heparin  shots. Patient states she didn't notice the bruising until today. Explained the side effect of heparin  shots is bruising to the areas. Patient denies pain, swelling or that the bruising has gotten bigger. Patient is encouraged to reach out to [facility] if she has any more questions. Patient was scheduled for hospital follow up visit-scheduled with another provider in office as main provider doesn't have an appointment available with 14 days of patient's discharge date.   FYI Only or Action Required?: FYI only for provider.  Patient was last seen in primary care on 08/09/2024 by Billy Philippe SAUNDERS, NP.  Called Nurse Triage reporting Skin Problem.  Symptoms began today.  Interventions attempted: Nothing.  Symptoms are: stable.  Triage Disposition: Home Care  Patient/caregiver understands and will follow disposition?: Yes  Copied from CRM [id number]. Topic: Clinical - Red Word Triage >> Sep 26, 2024 11:46 AM Harlene ORN wrote: Red Word that prompted transfer to Nurse Triage: Patient called. Is currently home from hospital. Was in the bathroom was has a dark red bruise on her stomach from when she was given shots to prevent blood clots by the doctors in the hospital. Reason for Disposition  Minor bruise or swelling  Answer Assessment - Initial Assessment Questions 1. APPEARANCE What does the injury look like?      Bruising from heparin  shots  2. ONSET: How long ago did the injury occur?      In the hospital 3. LOCATION: Where is the injury located?      To abdomen 5. BLEEDING: Is it bleeding now? If Yes, ask: Is it difficult to stop?      no 6. PAIN: Is there any pain? If Yes, ask: How bad is the pain? (Scale 0-10; or none, mild, moderate, severe)     no 9. PREGNANCY: Is there any chance you are pregnant? When was your last  menstrual period?     Pregnant  Protocols used: Skin Injury-A-AH

## 2024-09-27 ENCOUNTER — Encounter: Payer: Self-pay | Admitting: Family Medicine

## 2024-09-27 ENCOUNTER — Ambulatory Visit (INDEPENDENT_AMBULATORY_CARE_PROVIDER_SITE_OTHER): Payer: MEDICAID | Admitting: Family Medicine

## 2024-09-27 ENCOUNTER — Telehealth (HOSPITAL_BASED_OUTPATIENT_CLINIC_OR_DEPARTMENT_OTHER): Payer: Self-pay

## 2024-09-27 VITALS — BP 100/60 | HR 114 | Temp 98.4°F | Wt 346.5 lb

## 2024-09-27 DIAGNOSIS — J45902 Unspecified asthma with status asthmaticus: Secondary | ICD-10-CM | POA: Diagnosis not present

## 2024-09-27 NOTE — Telephone Encounter (Signed)
 Patient left a voicemail on the nurse line requesting a return call. Spoke with patient. Patient states that she has noticed multiple small bruises on her abdomen from where she was receiving injections when admitted to the hospital. Per review of records patient was receiving heparin  subcutaneous injections. Advised you may have small bruising with subcutaneous injections. Patient denies any swelling, redness, or pain to the area. Advised to monitor and if she develops any symptoms to contact the office. Patient is agreeable.  Patient is requesting a referral to PT for increased lower back pain. Reports her mother is seen at [facility] in [facility]. She would like a referral there. Advised will speak with Arland regarding placing referral and will return call. Patient is agreeable.

## 2024-09-27 NOTE — Progress Notes (Signed)
 Established Patient Office Visit  Subjective   Patient ID: Whitney Beard, female    DOB: 2001/07/30  Age: 23 y.o. MRN: [id number]  Chief Complaint  Patient presents with   Hospitalization Follow-up    HPI   Whitney Beard is seen today for hospital follow-up.  She is 7 months pregnant and was admitted back on October 19 for 1 week with status asthmaticus.  She had been around a niece who had rhinovirus.  She contacted the same and apparently had positive viral screen for rhinovirus.  Was transferred from the ER to ICU.  Was given IV steroids, IV magnesium , frequent nebulized beta agonist.  Chest x-ray was clear.  Lab work was unremarkable.  OB consulted because of her pregnancy status.  She eventually improved with steroids and beta agonist and oxygen  and was transition to oral steroids.  She was also started on Symbicort  80 mg 2 puffs twice daily.  Had not been taking steroid inhaler prior to admission.  She is followed by pulmonologist.  Doing well at this time.  Feels like she is back to baseline.  No fever.  No cough.  Wheezing has resolved.  Pregnancy is going well.  She feels frequent fetal movements.  She has had flu vaccine already.  Past Medical History:  Diagnosis Date   ADHD (attention deficit hyperactivity disorder)    Allergic rhinitis    Asthma    BMI 45.0-49.9, adult (HCC)    Borderline personality disorder (HCC)    Migraines    Obesity    Prediabetes    states today 11/11/20 was told in the past that she was pre- diabetic   Sleep apnea    Suicide attempt [facility])    Past Surgical History:  Procedure Laterality Date   ADENOIDECTOMY     TONSILLECTOMY     WISDOM TOOTH EXTRACTION Bilateral     reports that she has quit smoking. Her smoking use included cigarettes. She has never used smokeless tobacco. She reports that she does not currently use alcohol. She reports that she does not currently use drugs. family history includes Asthma in her father and maternal  grandmother; Bipolar disorder in her father; Diabetes in her paternal grandmother; Heart disease in her paternal grandmother; Liver disease in her father; SIDS in her maternal aunt; Schizophrenia in her father and maternal grandmother; Seizures in her paternal grandmother. Allergies  Allergen Reactions   Other Shortness Of Breath and Other (See Comments)    Animals such as cats and dogs and pollen - sneezing, asthma trigger     Review of Systems  Constitutional:  Negative for chills and fever.  Respiratory:  Negative for cough, hemoptysis, sputum production, shortness of breath and wheezing.   Cardiovascular:  Negative for chest pain.      Objective:     BP 100/60   Pulse (!) 114   Temp 98.4 F (36.9 C) (Oral)   Wt (!) 346 lb 8 oz (157.2 kg)   LMP 02/25/2024 (Approximate)   SpO2 99%   BMI 55.93 kg/m  BP Readings from Last 3 Encounters:  09/27/24 100/60  09/25/24 104/64  09/14/24 117/69   Wt Readings from Last 3 Encounters:  09/27/24 (!) 346 lb 8 oz (157.2 kg)  09/25/24 (!) 348 lb 9.6 oz (158.1 kg)  09/13/24 (!) 349 lb 6.4 oz (158.5 kg)      Physical Exam Vitals reviewed.  Constitutional:      General: She is not in acute distress. HENT:  Right Ear: Tympanic membrane normal.     Left Ear: Tympanic membrane normal.  Cardiovascular:     Rate and Rhythm: Normal rate and regular rhythm.  Pulmonary:     Effort: Pulmonary effort is normal.     Breath sounds: Normal breath sounds. No wheezing or rales.  Musculoskeletal:     Cervical back: Neck supple.  Lymphadenopathy:     Cervical: No cervical adenopathy.  Neurological:     Mental Status: She is alert.      No results found for any visits on 09/27/24.    The ASCVD Risk score (Arnett DK, et al., 2019) failed to calculate for the following reasons:   The 2019 ASCVD risk score is only valid for ages 53 to 65    Assessment & Plan:   Recent hospitalization for status asthmaticus.  Patient feels back to  baseline at this time.  She is doing well on Symbicort  inhaler twice daily.  She has rescue inhaler at home as well.  Has finished prednisone  at this time.  She has had flu vaccine already.  Follow-up promptly for any recurrent wheezing or other concerns.  She has close follow-up scheduled already with her GYN.  Patient knows to be sure to rinse mouth after inhaler use to prevent thrush  Wolm Scarlet, MD

## 2024-09-30 ENCOUNTER — Other Ambulatory Visit (HOSPITAL_BASED_OUTPATIENT_CLINIC_OR_DEPARTMENT_OTHER): Payer: Self-pay | Admitting: Certified Nurse Midwife

## 2024-09-30 DIAGNOSIS — M549 Dorsalgia, unspecified: Secondary | ICD-10-CM

## 2024-10-01 ENCOUNTER — Inpatient Hospital Stay (EMERGENCY_DEPARTMENT_HOSPITAL)
Admission: AD | Admit: 2024-10-01 | Discharge: 2024-10-01 | Disposition: A | Discharge status: Home / Self Care | Payer: MEDICAID | Source: Home / Self Care | Attending: Obstetrics and Gynecology | Admitting: Obstetrics and Gynecology

## 2024-10-01 ENCOUNTER — Inpatient Hospital Stay (HOSPITAL_COMMUNITY)
Admission: AD | Admit: 2024-10-01 | Discharge: 2024-10-01 | Disposition: A | Discharge status: Home / Self Care | Payer: MEDICAID | Attending: Obstetrics and Gynecology | Admitting: Obstetrics and Gynecology

## 2024-10-01 ENCOUNTER — Other Ambulatory Visit: Payer: Self-pay

## 2024-10-01 ENCOUNTER — Encounter (HOSPITAL_COMMUNITY): Payer: Self-pay | Admitting: Obstetrics and Gynecology

## 2024-10-01 DIAGNOSIS — R1011 Right upper quadrant pain: Secondary | ICD-10-CM | POA: Insufficient documentation

## 2024-10-01 DIAGNOSIS — B9689 Other specified bacterial agents as the cause of diseases classified elsewhere: Secondary | ICD-10-CM | POA: Insufficient documentation

## 2024-10-01 DIAGNOSIS — R109 Unspecified abdominal pain: Secondary | ICD-10-CM | POA: Insufficient documentation

## 2024-10-01 DIAGNOSIS — O99613 Diseases of the digestive system complicating pregnancy, third trimester: Secondary | ICD-10-CM

## 2024-10-01 DIAGNOSIS — O26893 Other specified pregnancy related conditions, third trimester: Secondary | ICD-10-CM | POA: Diagnosis present

## 2024-10-01 DIAGNOSIS — Z3A30 30 weeks gestation of pregnancy: Secondary | ICD-10-CM

## 2024-10-01 DIAGNOSIS — R1012 Left upper quadrant pain: Secondary | ICD-10-CM | POA: Diagnosis not present

## 2024-10-01 DIAGNOSIS — N390 Urinary tract infection, site not specified: Secondary | ICD-10-CM | POA: Insufficient documentation

## 2024-10-01 DIAGNOSIS — K5900 Constipation, unspecified: Secondary | ICD-10-CM | POA: Diagnosis not present

## 2024-10-01 DIAGNOSIS — O2343 Unspecified infection of urinary tract in pregnancy, third trimester: Secondary | ICD-10-CM | POA: Insufficient documentation

## 2024-10-01 DIAGNOSIS — O10913 Unspecified pre-existing hypertension complicating pregnancy, third trimester: Secondary | ICD-10-CM | POA: Insufficient documentation

## 2024-10-01 DIAGNOSIS — O23593 Infection of other part of genital tract in pregnancy, third trimester: Secondary | ICD-10-CM | POA: Insufficient documentation

## 2024-10-01 DIAGNOSIS — K2900 Acute gastritis without bleeding: Secondary | ICD-10-CM

## 2024-10-01 DIAGNOSIS — O10919 Unspecified pre-existing hypertension complicating pregnancy, unspecified trimester: Secondary | ICD-10-CM

## 2024-10-01 DIAGNOSIS — K219 Gastro-esophageal reflux disease without esophagitis: Secondary | ICD-10-CM | POA: Diagnosis not present

## 2024-10-01 LAB — COMPREHENSIVE METABOLIC PANEL WITH GFR
ALT: 15 U/L (ref 0–44)
ALT: 15 U/L (ref 0–44)
AST: 11 U/L — ABNORMAL LOW (ref 15–41)
AST: 11 U/L — ABNORMAL LOW (ref 15–41)
Albumin: 2.4 g/dL — ABNORMAL LOW (ref 3.5–5.0)
Albumin: 2.4 g/dL — ABNORMAL LOW (ref 3.5–5.0)
Alkaline Phosphatase: 69 U/L (ref 38–126)
Alkaline Phosphatase: 76 U/L (ref 38–126)
Anion gap: 10 (ref 5–15)
Anion gap: 11 (ref 5–15)
BUN: 6 mg/dL (ref 6–20)
BUN: 6 mg/dL (ref 6–20)
CO2: 21 mmol/L — ABNORMAL LOW (ref 22–32)
CO2: 22 mmol/L (ref 22–32)
Calcium: 8.1 mg/dL — ABNORMAL LOW (ref 8.9–10.3)
Calcium: 8.3 mg/dL — ABNORMAL LOW (ref 8.9–10.3)
Chloride: 102 mmol/L (ref 98–111)
Chloride: 102 mmol/L (ref 98–111)
Creatinine, Ser: 0.7 mg/dL (ref 0.44–1.00)
Creatinine, Ser: 0.7 mg/dL (ref 0.44–1.00)
GFR, Estimated: >60 mL/min (ref >60)
GFR, Estimated: >60 mL/min (ref >60)
Glucose, Bld: 101 mg/dL — ABNORMAL HIGH (ref 70–99)
Glucose, Bld: 91 mg/dL (ref 70–99)
Potassium: 4.1 mmol/L (ref 3.5–5.1)
Potassium: 3.8 mmol/L (ref 3.5–5.1)
Sodium: 133 mmol/L — ABNORMAL LOW (ref 135–145)
Sodium: 135 mmol/L (ref 135–145)
Total Bilirubin: 0.7 mg/dL (ref 0.0–1.2)
Total Bilirubin: 0.9 mg/dL (ref 0.0–1.2)
Total Protein: 6 g/dL — ABNORMAL LOW (ref 6.5–8.1)
Total Protein: 6.3 g/dL — ABNORMAL LOW (ref 6.5–8.1)

## 2024-10-01 LAB — CBC WITH DIFFERENTIAL/PLATELET
Abs Immature Granulocytes: 0.09 K/uL — ABNORMAL HIGH (ref 0.00–0.07)
Abs Immature Granulocytes: 0.09 K/uL — ABNORMAL HIGH (ref 0.00–0.07)
Basophils Absolute: 0 K/uL (ref 0.0–0.1)
Basophils Absolute: 0 K/uL (ref 0.0–0.1)
Basophils Relative: 0 %
Basophils Relative: 0 %
Eosinophils Absolute: 0 K/uL (ref 0.0–0.5)
Eosinophils Absolute: 0 K/uL (ref 0.0–0.5)
Eosinophils Relative: 0 %
Eosinophils Relative: 0 %
HCT: 29.8 % — ABNORMAL LOW (ref 36.0–46.0)
HCT: 29.7 % — ABNORMAL LOW (ref 36.0–46.0)
Hemoglobin: 10.1 g/dL — ABNORMAL LOW (ref 12.0–15.0)
Hemoglobin: 10.1 g/dL — ABNORMAL LOW (ref 12.0–15.0)
Immature Granulocytes: 1 %
Immature Granulocytes: 1 %
Lymphocytes Relative: 16 %
Lymphocytes Relative: 14 %
Lymphs Abs: 2 K/uL (ref 0.7–4.0)
Lymphs Abs: 1.5 K/uL (ref 0.7–4.0)
MCH: 31.6 pg (ref 26.0–34.0)
MCH: 31.8 pg (ref 26.0–34.0)
MCHC: 33.9 g/dL (ref 30.0–36.0)
MCHC: 34 g/dL (ref 30.0–36.0)
MCV: 93.1 fL (ref 80.0–100.0)
MCV: 93.4 fL (ref 80.0–100.0)
Monocytes Absolute: 0.9 K/uL (ref 0.1–1.0)
Monocytes Absolute: 0.9 K/uL (ref 0.1–1.0)
Monocytes Relative: 8 %
Monocytes Relative: 8 %
Neutro Abs: 9.3 K/uL — ABNORMAL HIGH (ref 1.7–7.7)
Neutro Abs: 8.1 K/uL — ABNORMAL HIGH (ref 1.7–7.7)
Neutrophils Relative %: 75 %
Neutrophils Relative %: 77 %
Platelets: 258 K/uL (ref 150–400)
Platelets: 270 K/uL (ref 150–400)
RBC: 3.2 MIL/uL — ABNORMAL LOW (ref 3.87–5.11)
RBC: 3.18 MIL/uL — ABNORMAL LOW (ref 3.87–5.11)
RDW: 12.8 % (ref 11.5–15.5)
RDW: 12.9 % (ref 11.5–15.5)
WBC: 12.4 K/uL — ABNORMAL HIGH (ref 4.0–10.5)
WBC: 10.6 K/uL — ABNORMAL HIGH (ref 4.0–10.5)
nRBC: 0 % (ref 0.0–0.2)
nRBC: 0 % (ref 0.0–0.2)

## 2024-10-01 LAB — WET PREP, GENITAL
Sperm: NONE SEEN
Trich, Wet Prep: NONE SEEN
WBC, Wet Prep HPF POC: <10 (ref <10)
Yeast Wet Prep HPF POC: NONE SEEN

## 2024-10-01 LAB — URINALYSIS, ROUTINE W REFLEX MICROSCOPIC
Bilirubin Urine: NEGATIVE
Glucose, UA: NEGATIVE mg/dL
Hgb urine dipstick: NEGATIVE
Ketones, ur: NEGATIVE mg/dL
Nitrite: NEGATIVE
Protein, ur: 30 mg/dL — AB
Specific Gravity, Urine: 1.013 (ref 1.005–1.030)
WBC, UA: >50 WBC/hpf (ref 0–5)
pH: 7 (ref 5.0–8.0)

## 2024-10-01 LAB — PROTEIN / CREATININE RATIO, URINE
Creatinine, Urine: 78 mg/dL
Protein Creatinine Ratio: 0.22 mg/mg{creat} — ABNORMAL HIGH (ref 0.00–0.15)
Total Protein, Urine: 17 mg/dL

## 2024-10-01 LAB — LIPASE, BLOOD
Lipase: 21 U/L (ref 11–51)
Lipase: 20 U/L (ref 11–51)

## 2024-10-01 LAB — AMYLASE: Amylase: 53 U/L (ref 28–100)

## 2024-10-01 MED ORDER — PANTOPRAZOLE SODIUM 20 MG PO TBEC
20.0000 mg | DELAYED_RELEASE_TABLET | Freq: Once | ORAL | Status: AC
  Administered 2024-10-01: 20 mg via ORAL
  Filled 2024-10-01: qty 1

## 2024-10-01 MED ORDER — ALUMINUM-MAGNESIUM-SIMETHICONE 200-200-20 MG/5ML PO SUSP: 30.0000 mL | Freq: Three times a day (TID) | ORAL | 2 refills | Status: AC | PRN

## 2024-10-01 MED ORDER — ACETAMINOPHEN 500 MG PO TABS
1000.0000 mg | ORAL_TABLET | Freq: Once | ORAL | Status: AC
  Administered 2024-10-01: 1000 mg via ORAL
  Filled 2024-10-01: qty 2

## 2024-10-01 MED ORDER — POLYETHYLENE GLYCOL 3350 17 GM/SCOOP PO POWD: ORAL | 2 refills | Status: DC

## 2024-10-01 MED ORDER — NIFEDIPINE ER OSMOTIC RELEASE 30 MG PO TB24: 30.0000 mg | ORAL_TABLET | Freq: Every day | ORAL | 0 refills | Status: AC

## 2024-10-01 MED ORDER — ACETAMINOPHEN 500 MG PO TABS: 1000.0000 mg | ORAL_TABLET | Freq: Four times a day (QID) | ORAL | Status: DC | PRN

## 2024-10-01 MED ORDER — LIDOCAINE VISCOUS HCL 2 % MT SOLN
15.0000 mL | Freq: Once | OROMUCOSAL | Status: AC
  Administered 2024-10-01: 15 mL via ORAL
  Filled 2024-10-01: qty 15

## 2024-10-01 MED ORDER — FLEET ENEMA RE ENEM
1.0000 | ENEMA | Freq: Once | RECTAL | Status: AC
  Administered 2024-10-01: 1 via RECTAL

## 2024-10-01 MED ORDER — ALUM & MAG HYDROXIDE-SIMETH 200-200-20 MG/5ML PO SUSP
15.0000 mL | Freq: Once | ORAL | Status: AC
  Administered 2024-10-01: 15 mL via ORAL
  Filled 2024-10-01: qty 30

## 2024-10-01 MED ORDER — ONDANSETRON 4 MG PO TBDP
ORAL_TABLET | ORAL | Status: AC
  Administered 2024-10-01: 8 mg via ORAL
  Filled 2024-10-01: qty 2

## 2024-10-01 MED ORDER — CEFADROXIL 500 MG PO CAPS
500.0000 mg | ORAL_CAPSULE | Freq: Once | ORAL | Status: AC
  Administered 2024-10-01: 500 mg via ORAL
  Filled 2024-10-01: qty 1

## 2024-10-01 MED ORDER — ACETAMINOPHEN 500 MG PO TABS
ORAL_TABLET | ORAL | Status: AC
  Administered 2024-10-01: 1000 mg via ORAL
  Filled 2024-10-01: qty 2

## 2024-10-01 MED ORDER — ALUM & MAG HYDROXIDE-SIMETH 200-200-20 MG/5ML PO SUSP
30.0000 mL | Freq: Once | ORAL | Status: AC
  Administered 2024-10-01: 30 mL via ORAL
  Filled 2024-10-01: qty 30

## 2024-10-01 MED ORDER — CEFADROXIL 500 MG PO CAPS: 500.0000 mg | ORAL_CAPSULE | Freq: Two times a day (BID) | ORAL | 0 refills | Status: DC

## 2024-10-01 MED ORDER — ONDANSETRON 4 MG PO TBDP: 8.0000 mg | ORAL_TABLET | Freq: Once | ORAL | Status: AC

## 2024-10-01 MED ORDER — FLEET ENEMA RE ENEM: 1.0000 | ENEMA | Freq: Once | RECTAL | Status: DC

## 2024-10-01 MED ORDER — CYCLOBENZAPRINE HCL 5 MG PO TABS
10.0000 mg | ORAL_TABLET | Freq: Once | ORAL | Status: AC
  Administered 2024-10-01: 10 mg via ORAL
  Filled 2024-10-01: qty 2

## 2024-10-01 MED ORDER — ONDANSETRON 4 MG PO TBDP: 4.0000 mg | ORAL_TABLET | Freq: Three times a day (TID) | ORAL | 0 refills | Status: DC | PRN

## 2024-10-01 MED ORDER — FAMOTIDINE 20 MG PO TABS: 20.0000 mg | ORAL_TABLET | Freq: Two times a day (BID) | ORAL | 0 refills | Status: AC

## 2024-10-01 MED ORDER — NIFEDIPINE ER OSMOTIC RELEASE 30 MG PO TB24: 30.0000 mg | ORAL_TABLET | Freq: Every day | ORAL | 0 refills | Status: DC

## 2024-10-01 MED ORDER — METRONIDAZOLE 500 MG PO TABS: 500.0000 mg | ORAL_TABLET | Freq: Two times a day (BID) | ORAL | 0 refills | Status: AC

## 2024-10-01 NOTE — MAU Note (Signed)
 Whitney Beard is a 23 y.o. at [redacted]w[redacted]d here in MAU reporting: she was seen in MAU last night for abdominal pain and continued to have abdominal pain this morning.  Reports the pain is sharp and constant and radiates into bilateral sides and back.  Denies VB and LOF.  Endorses +FM.   Reports took Tylenol  500 mg @ 0830 this morning.  LMP: 02/25/2024 Onset of complaint: today Pain score: 8 There were no vitals filed for this visit.   FHT: 144 bpm  Lab orders placed from triage: None

## 2024-10-01 NOTE — MAU Provider Note (Signed)
 History     CSN: [id number]  Arrival date and time: 10/01/24 0048   Event Date/Time   First Provider Initiated Contact with Patient 10/01/24 0226      Chief Complaint  Patient presents with   Abdominal Pain   Whitney Beard , a  23 y.o. G1P0 at [redacted]w[redacted]d presents to MAU with complaints of abdominal pain since Thursday. Patient states that on today the pain is now wrapping around to her back and mainly on the left side of her back. The abdominal pain is upper left and right quadrants and currently rates as a 7/10. She describes as a tight dully achy pain that was intermittent but today has been more consistent. She attempted a heating pad, with minimal relief. Also reports pain accompanied with nausea no vomiting. Denies vaginal bleeding, leaking of fluid, contractions and urinary symptoms.  Endorses positive fetal movement.          OB History     Gravida  1   Para      Term      Preterm      AB      Living         SAB      IAB      Ectopic      Multiple      Live Births              Past Medical History:  Diagnosis Date   ADHD (attention deficit hyperactivity disorder)    Allergic rhinitis    Asthma    BMI 45.0-49.9, adult (HCC)    Borderline personality disorder (HCC)    Migraines    Obesity    Prediabetes    states today 11/11/20 was told in the past that she was pre- diabetic   Sleep apnea    Suicide attempt [facility])     Past Surgical History:  Procedure Laterality Date   ADENOIDECTOMY     TONSILLECTOMY     WISDOM TOOTH EXTRACTION Bilateral     Family History  Problem Relation Age of Onset   Bipolar disorder Father    Schizophrenia Father    Liver disease Father    Asthma Father    SIDS Maternal Aunt    Asthma Maternal Grandmother    Schizophrenia Maternal Grandmother    Heart disease Paternal Grandmother    Diabetes Paternal Grandmother    Seizures Paternal Grandmother     Social History   Tobacco Use   Smoking status:  Former    Current packs/day: 0.50    Types: Cigarettes   Smokeless tobacco: Never   Tobacco comments:    vapes   Vaping Use   Vaping status: Former  Substance Use Topics   Alcohol use: Not Currently   Drug use: Not Currently    Allergies:  Allergies  Allergen Reactions   Other Shortness Of Breath and Other (See Comments)    Animals such as cats and dogs and pollen - sneezing, asthma trigger     Medications Prior to Admission  Medication Sig Dispense Refill Last Dose/Taking   albuterol  (PROVENTIL ) (2.5 MG/3ML) 0.083% nebulizer solution Take 3 mLs (2.5 mg total) by nebulization every 4 (four) hours as needed for wheezing or shortness of breath. 75 mL 2    albuterol  (VENTOLIN  HFA) 108 (90 Base) MCG/ACT inhaler Inhale 2 puffs into the lungs every 4 (four) hours as needed for wheezing or shortness of breath. 18 g 1    aspirin  EC 81 MG  tablet Take 1 tablet (81 mg total) by mouth daily. Swallow whole.      Blood Pressure Monitoring (BLOOD PRESSURE KIT) DEVI Check BP daily 1 each 0    budesonide -formoterol  (SYMBICORT ) 80-4.5 MCG/ACT inhaler Inhale 2 puffs into the lungs.      [Paused] busPIRone  (BUSPAR ) 10 MG tablet Take 1 tablet (10 mg total) by mouth 3 (three) times daily as needed. (Patient taking differently: Take 10 mg by mouth 3 (three) times daily as needed (For anxiety).) 30 tablet 0    ondansetron  (ZOFRAN ) 4 MG tablet Take 1 tablet (4 mg total) by mouth every 8 (eight) hours as needed for nausea or vomiting. 30 tablet 3    prenatal vitamin w/FE, FA (PRENATAL 1 + 1) 27-1 MG TABS tablet Take 1 tablet by mouth daily at 12 noon. 30 tablet 12     Review of Systems  Constitutional:  Negative for chills, fatigue and fever.  Eyes:  Negative for pain and visual disturbance.  Respiratory:  Negative for apnea, shortness of breath and wheezing.   Cardiovascular:  Negative for chest pain and palpitations.  Gastrointestinal:  Positive for abdominal pain and nausea. Negative for constipation,  diarrhea and vomiting.  Genitourinary:  Negative for difficulty urinating, dysuria, pelvic pain, vaginal bleeding, vaginal discharge and vaginal pain.  Musculoskeletal:  Negative for back pain.  Neurological:  Negative for seizures, weakness and headaches.  Psychiatric/Behavioral:  Negative for suicidal ideas.    Physical Exam   Blood pressure 129/69, pulse (!) 106, temperature 98.4 F (36.9 C), temperature source Oral, resp. rate 18, height 5' 6 (1.676 m), weight (!) 157.1 kg, last menstrual period 02/25/2024, SpO2 96%.  Physical Exam Vitals and nursing note reviewed. Exam conducted with a chaperone present.  Constitutional:      General: She is not in acute distress.    Appearance: Normal appearance.  HENT:     Head: Normocephalic.  Pulmonary:     Effort: Pulmonary effort is normal.  Abdominal:     Tenderness: There is abdominal tenderness in the right upper quadrant, periumbilical area and left upper quadrant. There is no guarding. Negative signs include Murphy's sign and McBurney's sign.  Musculoskeletal:     Cervical back: Normal range of motion.  Skin:    General: Skin is warm and dry.  Neurological:     Mental Status: She is alert and oriented to person, place, and time.  Psychiatric:        Mood and Affect: Mood normal.    FHT: 150bpm with moderate variability. Accles present no decels  Toco: occasional contractions. Patient unaware.  MAU Course  Procedures Orders Placed This Encounter  Procedures   Culture, OB Urine   Urinalysis, Routine w reflex microscopic -Urine, Clean Catch   CBC with Differential/Platelet   Comprehensive metabolic panel   Amylase   Lipase, blood   CBC with Differential/Platelet   Discharge patient Discharge disposition: 01-Home or Self Care; Discharge patient date: 10/01/2024   Results for orders placed or performed during the hospital encounter of 10/01/24 (from the past 24 hours)  Urinalysis, Routine w reflex microscopic -Urine, Clean  Catch     Status: Abnormal   Collection Time: 10/01/24  1:51 AM  Result Value Ref Range   Color, Urine YELLOW YELLOW   APPearance CLOUDY (A) CLEAR   Specific Gravity, Urine 1.013 1.005 - 1.030   pH 7.0 5.0 - 8.0   Glucose, UA NEGATIVE NEGATIVE mg/dL   Hgb urine dipstick NEGATIVE NEGATIVE   Bilirubin  Urine NEGATIVE NEGATIVE   Ketones, ur NEGATIVE NEGATIVE mg/dL   Protein, ur 30 (A) NEGATIVE mg/dL   Nitrite NEGATIVE NEGATIVE   Leukocytes,Ua MODERATE (A) NEGATIVE   RBC / HPF 6-10 0 - 5 RBC/hpf   WBC, UA >50 0 - 5 WBC/hpf   Bacteria, UA FEW (A) NONE SEEN   Squamous Epithelial / HPF 11-20 0 - 5 /HPF   Mucus PRESENT   Comprehensive metabolic panel     Status: Abnormal   Collection Time: 10/01/24  2:10 AM  Result Value Ref Range   Sodium 133 (L) 135 - 145 mmol/L   Potassium 4.1 3.5 - 5.1 mmol/L   Chloride 102 98 - 111 mmol/L   CO2 21 (L) 22 - 32 mmol/L   Glucose, Bld 101 (H) 70 - 99 mg/dL   BUN 6 6 - 20 mg/dL   Creatinine, Ser 9.29 0.44 - 1.00 mg/dL   Calcium  8.1 (L) 8.9 - 10.3 mg/dL   Total Protein 6.0 (L) 6.5 - 8.1 g/dL   Albumin 2.4 (L) 3.5 - 5.0 g/dL   AST 11 (L) 15 - 41 U/L   ALT 15 0 - 44 U/L   Alkaline Phosphatase 69 38 - 126 U/L   Total Bilirubin 0.7 0.0 - 1.2 mg/dL   GFR, Estimated >39 >39 mL/min   Anion gap 10 5 - 15  Amylase     Status: None   Collection Time: 10/01/24  2:10 AM  Result Value Ref Range   Amylase 53 28 - 100 U/L  Lipase, blood     Status: None   Collection Time: 10/01/24  2:10 AM  Result Value Ref Range   Lipase 21 11 - 51 U/L  CBC with Differential/Platelet     Status: Abnormal   Collection Time: 10/01/24  3:30 AM  Result Value Ref Range   WBC 12.4 (H) 4.0 - 10.5 K/uL   RBC 3.20 (L) 3.87 - 5.11 MIL/uL   Hemoglobin 10.1 (L) 12.0 - 15.0 g/dL   HCT 70.1 (L) 63.9 - 53.9 %   MCV 93.1 80.0 - 100.0 fL   MCH 31.6 26.0 - 34.0 pg   MCHC 33.9 30.0 - 36.0 g/dL   RDW 87.1 88.4 - 84.4 %   Platelets 258 150 - 400 K/uL   nRBC 0.0 0.0 - 0.2 %    Neutrophils Relative % 75 %   Neutro Abs 9.3 (H) 1.7 - 7.7 K/uL   Lymphocytes Relative 16 %   Lymphs Abs 2.0 0.7 - 4.0 K/uL   Monocytes Relative 8 %   Monocytes Absolute 0.9 0.1 - 1.0 K/uL   Eosinophils Relative 0 %   Eosinophils Absolute 0.0 0.0 - 0.5 K/uL   Basophils Relative 0 %   Basophils Absolute 0.0 0.0 - 0.1 K/uL   Immature Granulocytes 1 %   Abs Immature Granulocytes 0.09 (H) 0.00 - 0.07 K/uL    MDM - UA cloudy with protein, leuks and bacteria. Possible UTI, reflexed to culture. No CVA tenderness - Liver enzymes normal.  - White count mildly elevated, but could be a normal occurrence in pregnancy.  - Mild decrease in HGB, indicative for anemia in pregnancy. Recommend a iron supplement.  - Oral Zofran  and Tylenol  given for pain an discomfort while awaiting labs.  - Pain unrelieved initially. Given location of pain patient given a GI cocktail.  - Pain relieved with GI cocktail and currently rates as a 2/10.  - plan for discharge.   Assessment and Plan  1. Abdominal pain, unspecified abdominal location   2. Gastroesophageal reflux disease without esophagitis   3. [redacted] weeks gestation of pregnancy    - Reviewed that GERD-like symptoms can be a normal discomfort of pregnancy.  - Reviewed worsening signs and return precautions,  - Rx for Zofran , and Pepcid  sent to outpatient pharmacy for pickup.  - Encouraged comfort measures like heat and low acid foods.  - FHT appropriate for gestational age at time of discharge.  - Patient discharged home in stable condition and may return to MAU as needed.   Claris CHRISTELLA Cedar, MSN  CNM  10/01/2024, 2:26 AM

## 2024-10-01 NOTE — Discharge Instructions (Signed)

## 2024-10-01 NOTE — Progress Notes (Signed)
 Patient recently hospital in [facility] 09/17/24-09/24/24 for an extreme asthma attack. Patient has noticeable bruising along lower abdomen from lovenox  injections.   Patient had a fall on Thursday but was not evaluated afterwards. States she fell on her left side so it was not a direct hit to her abdomen but was sore afterwards. Denies any DFM, vaginal bleeding or other complications.

## 2024-10-01 NOTE — MAU Note (Addendum)
 Whitney Beard is a 23 y.o. at [redacted]w[redacted]d here in MAU reporting: abdominal pain that started in her upper abdomin on Thursday that was intermittent, bur is now constant and generalized. She described the pain as achy, nauseous, and like she feels like I had a big meal. She has not taken anything for the pain. Denies LOF, VB, or Ctxs. +FM  Onset of complaint: Thursday Pain score: 9/10 There were no vitals filed for this visit.   FHT: 150  Lab orders placed from triage: urine

## 2024-10-01 NOTE — MAU Note (Cosign Needed)
 Maternal Assessment Unit Provider Note  Subjective: Ms. Whitney Beard is a 23 y.o. G1P0 pregnant female at [redacted]w[redacted]d who presents to MAU today with complaint of abdominal and back pain.   She was seen in the MAU early this morning.  Labs negative for etiology of abdominal pain, aside from possible UTI (though it was a dirty catch) sent for urine culture.  She significantly improved with treatment with Maalox and simethicone  GI cocktail.  She felt better after the GI cocktail in the MAU this morning, but then woke up with worsening pain.  The pain now wraps around her back from the umbilicus.  It is 8 out of 10 pain level.  She reports she has had low back pain since about 5 months of pregnancy.  For 1 month she has had significant acid reflux symptoms, she has not addressed due to thinking it being just part of pregnancy.  Abdominal pain started 3 days ago.  It was sore quality.  Since yesterday the pain quality changed to a sharp quality and she has had nausea, and decreased appetite.    She notes increased frequency of urination, occasional urgency, no dysuria.  She last had a bowel movement 2 days ago.  She reports bowel movements every other day, she also notes straining with bowel movements.  No fevers or chills.  No shortness of breath.  Good fetal movement, no change in vaginal discharge, no vaginal bleeding, no contractions, no leaking fluid.  No headache, no difficulty breathing, no significant lower extremity edema.  She took 500 mg of acetaminophen  around 830 for pain.  She was noted to have an elevated blood pressure upon presentation to the MAU.  She reports she was hospitalized from October 19 to 26 for asthma and she had severely elevated blood pressure, to the point they were considering early delivery.  Receives care at [facility]. Prenatal records reviewed.  Pertinent items noted in HPI and remainder of comprehensive ROS otherwise negative.   Objective: BP 132/79    Pulse 86   Temp 98.1 F (36.7 C) (Oral)   Resp 18   Ht 5' 6 (1.676 m)   Wt (!) 157.2 kg   LMP 02/25/2024 (Approximate)   SpO2 99%   BMI 55.93 kg/m  Physical Exam Vitals reviewed.  Constitutional:      General: She is not in acute distress.    Appearance: She is well-developed. She is not diaphoretic.  Eyes:     General: No scleral icterus. Pulmonary:     Effort: Pulmonary effort is normal. No respiratory distress.  Abdominal:     General: Bowel sounds are normal. There is no distension.     Palpations: Abdomen is soft.     Tenderness: There is generalized abdominal tenderness. There is no right CVA tenderness, left CVA tenderness, guarding or rebound. Negative signs include Rovsing's sign and obturator sign.     Comments: Abdominal pain is worse in the epigastric region and suprapubic region.  Skin:    General: Skin is warm and dry.  Neurological:     General: No focal deficit present.     Mental Status: She is alert.     Coordination: Coordination normal.  Psychiatric:     Comments: Tearful    MDM: Moderate risk  MAU Course:  Time: 1100  FHT: baseline 150 bpm, moderate variability, + accelerations, occasional variable decelerations (appropriate for gestational age).  Contractions: none  Assessed patient. Abdominal pain likely multifactorial--GERD, UTI, constipation. Low back pain likely MSK  due to pregnancy. No concern for pyelo--no fever/chill, no WBC, no CVA TTP.   1230 Pain level improved slightly to 7 out of 10. Will proceed with enema.  Multiple elevated BPs. Review of prenatal appts demonstrate elevated BPs at 21 wks  Review of hospital records from [facility] for status asthmaticus demonstrate she was on Nifedipine 90mg  daily, though she was not discharged on any BP medication.  1600 Pre-E lab workup and lipase negative. Every time RN walks into the room pt is asleep, but she still reports abdomen is still bothering her. Initially declined enema, now  agreeable to proceed with it.   Reviewed prescribed medications with patient prior to discharge.   1700 Patient feeling better after having bowel movement. Wants to go home.  Questions were answered to the satisfaction of the patient and/or family prior to discharge.   Assessment Medical screening exam complete    ICD-10-CM   1. Constipation during pregnancy in third trimester  O99.613    K59.00     2. Urinary tract infection in mother during third trimester of pregnancy  O23.43     3. Acute gastritis without hemorrhage, unspecified gastritis type  K29.00     4. Bacterial vaginosis  N76.0    B96.89     5. Chronic hypertension affecting pregnancy  O10.919       Plan -Duracef for UTI  -miralax  for constipation  -metronidazole for BV  -pepcid  (prescribed this AM) and maalox for GERD/gastritis  -Chronic HTN, reviewed chart extensively, she was previously on Norvasc  in early pregnancy and was treated but not discharged with Procardia at [facility] during admission for status asmaticus. Here in the MAU, multiple Bps elevated systolic in the 140s, will start her on procardia 30 daily  Discharge from MAU in stable condition with strict pre-eclampsia/preterm labor precautions  Follow up at [facility] as scheduled for ongoing prenatal care  Allergies as of 10/01/2024       Reactions   Other Shortness Of Breath, Other (See Comments)   Animals such as cats and dogs and pollen - sneezing, asthma trigger         Medication List     TAKE these medications    aluminum-magnesium  hydroxide-simethicone  200-200-20 MG/5ML Susp Commonly known as: MAALOX Take 30 mLs by mouth 3 (three) times daily as needed (upper abdominal pain/acid reflux).   aspirin  EC 81 MG tablet Take 1 tablet (81 mg total) by mouth daily. Swallow whole.   Blood Pressure Kit Devi Check BP daily   budesonide -formoterol  80-4.5 MCG/ACT inhaler Commonly known as: SYMBICORT  Inhale 2 puffs into the lungs.    busPIRone  10 MG tablet Commonly known as: BUSPAR  Take 1 tablet (10 mg total) by mouth 3 (three) times daily as needed.   cefadroxil 500 MG capsule Commonly known as: DURICEF Take 1 capsule (500 mg total) by mouth 2 (two) times daily.   famotidine  20 MG tablet Commonly known as: PEPCID  Take 1 tablet (20 mg total) by mouth 2 (two) times daily.   metroNIDAZOLE 500 MG tablet Commonly known as: FLAGYL Take 1 tablet (500 mg total) by mouth 2 (two) times daily for 7 days.   ondansetron  4 MG disintegrating tablet Commonly known as: ZOFRAN -ODT Take 1 tablet (4 mg total) by mouth every 8 (eight) hours as needed for nausea or vomiting.   ondansetron  4 MG tablet Commonly known as: Zofran  Take 1 tablet (4 mg total) by mouth every 8 (eight) hours as needed for nausea or vomiting.   polyethylene glycol  powder 17 GM/SCOOP powder Commonly known as: MiraLax  Dissolve 1 capful (17g) in a large glass of water and take by mouth twice a day for 3-5 days until you are having regular bowel movements, then decrease to once daily after that for maintenance of daily bowel movements   prenatal vitamin w/FE, FA 27-1 MG Tabs tablet Take 1 tablet by mouth daily at 12 noon.   Ventolin  HFA 108 (90 Base) MCG/ACT inhaler Generic drug: albuterol  Inhale 2 puffs into the lungs every 4 (four) hours as needed for wheezing or shortness of breath.   albuterol  (2.5 MG/3ML) 0.083% nebulizer solution Commonly known as: PROVENTIL  Take 3 mLs (2.5 mg total) by nebulization every 4 (four) hours as needed for wheezing or shortness of breath.        Trudy Leeroy NOVAK, MD 10/01/2024 12:38 PM

## 2024-10-02 LAB — CULTURE, OB URINE

## 2024-10-02 LAB — GC/CHLAMYDIA PROBE AMP (~~LOC~~) NOT AT ARMC
Chlamydia: NEGATIVE
Comment: NEGATIVE
Comment: NORMAL
Neisseria Gonorrhea: NEGATIVE

## 2024-10-03 ENCOUNTER — Ambulatory Visit (INDEPENDENT_AMBULATORY_CARE_PROVIDER_SITE_OTHER): Payer: MEDICAID | Admitting: Certified Nurse Midwife

## 2024-10-03 VITALS — BP 123/70 | HR 89 | Wt 345.8 lb

## 2024-10-03 DIAGNOSIS — Z3403 Encounter for supervision of normal first pregnancy, third trimester: Secondary | ICD-10-CM

## 2024-10-03 DIAGNOSIS — R87612 Low grade squamous intraepithelial lesion on cytologic smear of cervix (LGSIL): Secondary | ICD-10-CM

## 2024-10-03 DIAGNOSIS — O3443 Maternal care for other abnormalities of cervix, third trimester: Secondary | ICD-10-CM

## 2024-10-03 DIAGNOSIS — Z3A30 30 weeks gestation of pregnancy: Secondary | ICD-10-CM | POA: Diagnosis not present

## 2024-10-03 NOTE — Progress Notes (Signed)
 PRENATAL VISIT NOTE  Subjective:  Whitney Beard is a 23 y.o. G1P0 at [redacted]w[redacted]d being seen today for ongoing prenatal care.  She is currently monitored for the following issues for this  pregnancy and has OSA (obstructive sleep apnea); Class 3 severe obesity due to excess calories with serious comorbidity and body mass index (BMI) of 50.0 to 59.9 in adult [facility]); Major depressive disorder; Severe bipolar I disorder, current or most recent episode depressed (HCC); Anxiety disorder, unspecified; Cannabis use disorder, mild, abuse; Borderline personality disorder (HCC); Severe persistent allergic asthma (HCC); Encounter for supervision of normal first pregnancy in second trimester; Maternal morbid obesity, antepartum (HCC); Asthma during pregnancy; H/O suicide attempt; History of prediabetes; Asthma exacerbation; LGSIL on Pap smear of cervix; and Family history of albinism on their problem list.  Patient reports no bleeding, no contractions, no cramping, no leaking, and pt has not had a BM in approx 4 days. She plans to try Miralax  Reports active fetal movement. No vaginal spotting or bleeding. No cramping, contractions or LOF from vagina. US  tomorrow for Growth. .  Contractions: Not present. Vag. Bleeding: None.  Movement: Present. Denies leaking of fluid.   The following portions of the patient's history were reviewed and updated as appropriate: allergies, current medications, past family history, past medical history, past social history, past surgical history and problem list.   Objective:   Vitals:   10/03/24 1043  BP: 123/70  Pulse: 89  Weight: (!) 345 lb 12.8 oz (156.9 kg)    Fetal Status:      Movement: Present    General: Alert, oriented and cooperative. Patient is in no acute distress.  Skin: Skin is warm and dry. No rash noted.   Cardiovascular: Normal heart rate noted  Respiratory: Normal respiratory effort, no problems with respiration noted  Abdomen: Soft, gravid, appropriate for  gestational age.  Pain/Pressure: Absent     Pelvic: Cervical exam deferred        Extremities: Normal range of motion.  Edema: None  Mental Status: Normal mood and affect. Normal behavior. Normal judgment and thought content.      09/11/2024    8:37 AM 08/30/2024    2:02 PM 08/29/2024    1:31 PM  Depression screen PHQ 2/9  Decreased Interest 0    Down, Depressed, Hopeless 1    PHQ - 2 Score 1    Altered sleeping 1    Tired, decreased energy 1    Change in appetite 2    Feeling bad or failure about yourself  0    Trouble concentrating 0    Moving slowly or fidgety/restless 0    Suicidal thoughts 0    PHQ-9 Score 5    Difficult doing work/chores        Information is confidential and restricted. Go to Review Flowsheets to unlock data.        09/11/2024    8:39 AM 08/30/2024    2:05 PM 08/29/2024    1:31 PM 08/09/2024   10:13 AM  GAD 7 : Generalized Anxiety Score  Nervous, Anxious, on Edge 3   1  Control/stop worrying 3   1  Worry too much - different things 3   1  Trouble relaxing 3   0  Restless 3   0  Easily annoyed or irritable 3   1  Afraid - awful might happen 1   1  Total GAD 7 Score 19   5  Anxiety Difficulty Somewhat difficult  Somewhat difficult     Information is confidential and restricted. Go to Review Flowsheets to unlock data.    Assessment and Plan:  Pregnancy: G1P0 at [redacted]w[redacted]d  1. [redacted] weeks gestation of pregnancy - on PNV and baby ASA - Pt passed 2hr GTT - Recheck CBC at next prenatal visit - Pt has US  appointment follow-up 10/04/24 - US  (08/30/24): Vtx, posterior placenta, AFI wnl, EFW 1lb 14oz (40%). Follow with Growth US . Start weekly    2. Encounter for supervision of normal first pregnancy in third trimester (Primary)   3. Asthma during pregnancy - Discharged home 09/24/24 after 1 week hospital stay due to asthma exacerbation - Pt reports using steroids, Symbicort , Albuterol  as directed   4. OSA (obstructive sleep apnea) - borderline.  Not  prescribed CPAP. - has pulmonologist, Dr. Neda   5. LGSIL on Pap smear of cervix - planning to repeat PP   6. Severe bipolar I disorder, current or most recent episode depressed (HCC) - Pt doing well emotionally at this time. Has good support system.   7. History of depression   8. Maternal morbid obesity, antepartum (HCC)  - PreGravid BMI 50 - Pt has MFM US  follow-up scheduled - Most recent HgA1C 05/22/24  5.0 (Normal) - Baseline PreE labs completed - Thyroid  Panel With TSH (within normal ranges) - Antenatal testing planned (start at 34 weeks)   Preterm labor symptoms and general obstetric precautions including but not limited to vaginal bleeding, contractions, leaking of fluid and fetal movement were reviewed in detail with the patient. Please refer to After Visit Summary for other counseling recommendations.   No follow-ups on file.  Future Appointments  Date Time Provider Department Center  10/04/2024  1:15 PM [facility] PROVIDER 1 [facility]  10/04/2024  1:30 PM [facility] US2 [facility]  10/16/2024  1:55 PM Brock Mokry, Arland POUR, CNM [facility] [id number]  10/18/2024 10:30 AM Collene, Reginia BRAVO, PA [facility] None  10/31/2024  2:55 PM Delores Nidia CROME, FNP [facility] [address]  11/06/2024 10:00 AM [facility] [address]  11/13/2024  2:00 PM Neda Jennet LABOR, MD [facility] [address]  11/14/2024  1:55 PM Cleotilde Ronal RAMAN, MD [facility] [address]  11/28/2024  1:15 PM Delores Nidia CROME, FNP [facility] [address]  12/04/2024  9:55 AM Klint Lezcano, Arland POUR, CNM [facility] [address]  12/11/2024  1:15 PM Saint Hank, Arland POUR, CNM [facility] [address]  12/18/2024  1:55 PM Makaiya Geerdes, Arland POUR, CNM [facility] [phone]    Arland POUR Roller, CNM

## 2024-10-04 ENCOUNTER — Other Ambulatory Visit: Payer: Self-pay | Admitting: *Deleted

## 2024-10-04 ENCOUNTER — Ambulatory Visit (HOSPITAL_BASED_OUTPATIENT_CLINIC_OR_DEPARTMENT_OTHER): Payer: MEDICAID

## 2024-10-04 ENCOUNTER — Ambulatory Visit: Payer: MEDICAID | Attending: Maternal & Fetal Medicine | Admitting: Maternal & Fetal Medicine

## 2024-10-04 DIAGNOSIS — Z362 Encounter for other antenatal screening follow-up: Secondary | ICD-10-CM | POA: Diagnosis present

## 2024-10-04 DIAGNOSIS — O99213 Obesity complicating pregnancy, third trimester: Secondary | ICD-10-CM | POA: Insufficient documentation

## 2024-10-04 DIAGNOSIS — Z3A30 30 weeks gestation of pregnancy: Secondary | ICD-10-CM

## 2024-10-04 DIAGNOSIS — O99513 Diseases of the respiratory system complicating pregnancy, third trimester: Secondary | ICD-10-CM | POA: Diagnosis not present

## 2024-10-04 DIAGNOSIS — O99519 Diseases of the respiratory system complicating pregnancy, unspecified trimester: Secondary | ICD-10-CM

## 2024-10-04 DIAGNOSIS — E66813 Obesity, class 3: Secondary | ICD-10-CM | POA: Diagnosis not present

## 2024-10-04 DIAGNOSIS — J4541 Moderate persistent asthma with (acute) exacerbation: Secondary | ICD-10-CM | POA: Diagnosis not present

## 2024-10-04 DIAGNOSIS — J45909 Unspecified asthma, uncomplicated: Secondary | ICD-10-CM

## 2024-10-04 DIAGNOSIS — Z6841 Body Mass Index (BMI) 40.0 and over, adult: Secondary | ICD-10-CM

## 2024-10-04 NOTE — Progress Notes (Signed)
 Patient information  Patient Name: Whitney Beard  Patient MRN:   [id number]  Referring practice: MFM Referring Provider: [facility]  Problem List   Patient Active Problem List   Diagnosis Date Noted   Family history of albinism 08/01/2024   LGSIL on Pap smear of cervix 07/05/2024   Asthma exacerbation 06/24/2024   History of prediabetes 06/23/2024   H/O suicide attempt 05/23/2024   Encounter for supervision of normal first pregnancy in second trimester 05/22/2024   Maternal morbid obesity, antepartum (HCC) 05/22/2024   Asthma during pregnancy 05/22/2024   Severe persistent allergic asthma (HCC) 03/03/2022   Severe bipolar I disorder, current or most recent episode depressed (HCC) 11/11/2020   Anxiety disorder, unspecified 11/11/2020   Cannabis use disorder, mild, abuse 11/11/2020   Borderline personality disorder (HCC) 11/11/2020   Major depressive disorder 06/28/2020   OSA (obstructive sleep apnea) 12/26/2018   Class 3 severe obesity due to excess calories with serious comorbidity and body mass index (BMI) of 50.0 to 59.9 in adult [facility]) 12/26/2018    Maternal Fetal medicine Consult  Whitney Beard is a 23 y.o. G1P0 at [redacted]w[redacted]d here for ultrasound and consultation. Whitney Beard is doing well today with no acute concerns. Today we focused on the following:   The patient is here for a growth US  due to elevated BMI, CHTN and asthma.    The patient was recently discharged in the hospital on 10/26 for an asthma exacerbation.  She was transferred to the [facility] ICU due to ongoing status asthmaticus.  She was given IV steroids, IV magnesium  and frequent nebulizer treatments with beta agonist.  She was afebrile.  She was positive for rhinovirus.  Chest x-ray was normal alon.  g with her liver enzymes and platelets.  She was eventually de-escalated to a hospital floor and started on Symbicort  with 2 puffs twice daily in addition to oral steroids.  She was  prescribed Procardia due to hypertension that was believed to be a result of the steroids.  Since this time she has been doing well.  She has no respiratory concerns and is compliant with her medications which include Symbicort  twice a day in addition to her albuterol  inhaler.  She has not had to use her albuterol  inhaler very much.  I discussed the presence of a possible fetal cyst in the brain that could either represent a cavum Verocay or an arachnoid cyst.  Since the remainder of the intracranial structures appear normal there is no further workup at this time given the advanced gestational age.  If this is seen again on the next ultrasound then a postnatal head ultrasound should be performed.  The patient had time to ask questions that were answered to her satisfaction.  She verbalized understanding and agrees to proceed with the plan below.  Recommendations - Continue to monitor asthma and continue to take medications as instructed - Due to elevated BMI and other comorbidities antenatal testing is recommended at 32 weeks - weekly NSTs can be done at the [facility] providers office - Serial growth ultrasounds every 4 to 5 weeks. - If there is ongoing concern for a CNS cyst then an ultrasound should be performed postnatally on the newborn.   Review of Systems: A review of systems was performed and was negative except per HPI   Vitals and Physical Exam    10/04/2024    1:20 PM 10/03/2024   10:43 AM 10/01/2024    5:10 PM  Vitals with BMI  Weight  345 lbs 13 oz   BMI  55.84   Systolic 127 123 865  Diastolic 78 70 80  Pulse 91 89 99    Sitting comfortably on the sonogram table Nonlabored breathing Normal rate and rhythm Abdomen is nontender  Past pregnancies OB History  Gravida Para Term Preterm AB Living  1       SAB IAB Ectopic Multiple Live Births          # Outcome Date GA Lbr Len/2nd Weight Sex Type Anes PTL Lv  1 Current              I spent 30 minutes reviewing the patients  chart, including labs and images as well as counseling the patient about her medical conditions. Greater than 50% of the time was spent in direct face-to-face patient counseling.  Delora Smaller  MFM, [place]   10/04/2024  1:49 PM

## 2024-10-12 ENCOUNTER — Ambulatory Visit (HOSPITAL_BASED_OUTPATIENT_CLINIC_OR_DEPARTMENT_OTHER)

## 2024-10-12 ENCOUNTER — Other Ambulatory Visit: Payer: Self-pay | Admitting: Obstetrics and Gynecology

## 2024-10-12 ENCOUNTER — Other Ambulatory Visit (HOSPITAL_COMMUNITY)
Admission: RE | Admit: 2024-10-12 | Discharge: 2024-10-12 | Disposition: A | Discharge status: Home / Self Care | Payer: MEDICAID | Source: Ambulatory Visit | Attending: Obstetrics & Gynecology | Admitting: Obstetrics & Gynecology

## 2024-10-12 VITALS — BP 109/83 | HR 95 | Ht 66.0 in | Wt 354.6 lb

## 2024-10-12 DIAGNOSIS — N898 Other specified noninflammatory disorders of vagina: Secondary | ICD-10-CM

## 2024-10-12 DIAGNOSIS — B379 Candidiasis, unspecified: Secondary | ICD-10-CM

## 2024-10-12 DIAGNOSIS — N760 Acute vaginitis: Secondary | ICD-10-CM

## 2024-10-12 DIAGNOSIS — L292 Pruritus vulvae: Secondary | ICD-10-CM

## 2024-10-12 MED ORDER — NYSTATIN 100000 UNIT/GM EX CREA: TOPICAL_CREAM | CUTANEOUS | 0 refills | Status: DC

## 2024-10-12 MED ORDER — FLUCONAZOLE 150 MG PO TABS: 150.0000 mg | ORAL_TABLET | Freq: Once | ORAL | 1 refills | Status: AC

## 2024-10-12 NOTE — Progress Notes (Signed)
 NURSE VISIT- VAGINITIS/STD/POC  SUBJECTIVE:  Signora Zucco is a 23 y.o. G1P0 [redacted]w[redacted]d pregnantfemale here for a vaginal swab for vaginitis screening.  She reports the following symptoms: vulvar itching, white thick discharge for 1  week. Denies abnormal vaginal bleeding, significant pelvic pain, fever, or UTI symptoms.  She reports that about a week ago, she went to the hospital because she was having stomach and back pain. She was told that she had a UTI and a bacterial infection. She started taking an antibiotic for that and several days later began having vaginal irritation. She reports vaginal itching and discharge, white cottage cheese like. Red blisters on the inside of the vagina and lips. Discharge is odorless.   Patient had pelvic exam completed by Nidia Daring, NP.   OBJECTIVE:  LMP 02/25/2024 (Approximate)   Appears well, in no apparent distress  ASSESSMENT: Vaginal swab for vaginitis screening  PLAN: Self-collected vaginal probe for Bacterial Vaginosis, Yeast sent to lab Treatment: to be determined once results are received Follow-up as needed if symptoms persist/worsen, or new symptoms develop.  Morna LOISE Quale, RN

## 2024-10-13 LAB — CERVICOVAGINAL ANCILLARY ONLY
Bacterial Vaginitis (gardnerella): NEGATIVE
Candida Glabrata: NEGATIVE
Candida Vaginitis: POSITIVE — AB
Comment: NEGATIVE
Comment: NEGATIVE
Comment: NEGATIVE

## 2024-10-15 ENCOUNTER — Ambulatory Visit: Payer: Self-pay | Admitting: Obstetrics and Gynecology

## 2024-10-16 ENCOUNTER — Ambulatory Visit (HOSPITAL_BASED_OUTPATIENT_CLINIC_OR_DEPARTMENT_OTHER): Payer: MEDICAID | Admitting: Certified Nurse Midwife

## 2024-10-16 ENCOUNTER — Ambulatory Visit (HOSPITAL_BASED_OUTPATIENT_CLINIC_OR_DEPARTMENT_OTHER): Payer: MEDICAID

## 2024-10-16 VITALS — BP 118/76 | HR 98 | Wt 355.2 lb

## 2024-10-16 DIAGNOSIS — O99343 Other mental disorders complicating pregnancy, third trimester: Secondary | ICD-10-CM | POA: Diagnosis not present

## 2024-10-16 DIAGNOSIS — G4733 Obstructive sleep apnea (adult) (pediatric): Secondary | ICD-10-CM | POA: Diagnosis not present

## 2024-10-16 DIAGNOSIS — O99213 Obesity complicating pregnancy, third trimester: Secondary | ICD-10-CM | POA: Diagnosis not present

## 2024-10-16 DIAGNOSIS — Z3A32 32 weeks gestation of pregnancy: Secondary | ICD-10-CM

## 2024-10-16 DIAGNOSIS — O99513 Diseases of the respiratory system complicating pregnancy, third trimester: Secondary | ICD-10-CM

## 2024-10-16 DIAGNOSIS — J45909 Unspecified asthma, uncomplicated: Secondary | ICD-10-CM

## 2024-10-16 DIAGNOSIS — Z87898 Personal history of other specified conditions: Secondary | ICD-10-CM

## 2024-10-16 DIAGNOSIS — J45901 Unspecified asthma with (acute) exacerbation: Secondary | ICD-10-CM

## 2024-10-16 DIAGNOSIS — F319 Bipolar disorder, unspecified: Secondary | ICD-10-CM | POA: Diagnosis not present

## 2024-10-16 DIAGNOSIS — O0990 Supervision of high risk pregnancy, unspecified, unspecified trimester: Secondary | ICD-10-CM

## 2024-10-16 DIAGNOSIS — O283 Abnormal ultrasonic finding on antenatal screening of mother: Secondary | ICD-10-CM | POA: Diagnosis not present

## 2024-10-16 DIAGNOSIS — O99353 Diseases of the nervous system complicating pregnancy, third trimester: Secondary | ICD-10-CM

## 2024-10-16 DIAGNOSIS — R87612 Low grade squamous intraepithelial lesion on cytologic smear of cervix (LGSIL): Secondary | ICD-10-CM

## 2024-10-16 DIAGNOSIS — J4550 Severe persistent asthma, uncomplicated: Secondary | ICD-10-CM

## 2024-10-16 NOTE — Progress Notes (Signed)
 PRENATAL VISIT NOTE  Subjective:  Whitney Beard is a 23 y.o. G1P0 at [redacted]w[redacted]d being seen today for ongoing prenatal care.  She is currently monitored for the following issues for this high-risk pregnancy and has OSA (obstructive sleep apnea); Class 3 severe obesity due to excess calories with serious comorbidity and body mass index (BMI) of 50.0 to 59.9 in adult [facility]); Major depressive disorder; Severe bipolar I disorder, current or most recent episode depressed (HCC); Anxiety disorder, unspecified; Cannabis use disorder, mild, abuse; Borderline personality disorder (HCC); Severe persistent allergic asthma (HCC); Supervision of high risk pregnancy, antepartum; Asthma during pregnancy; H/O suicide attempt; History of prediabetes; Asthma exacerbation; LGSIL on Pap smear of cervix; Family history of albinism; and Maternal morbid obesity in third trimester, antepartum (HCC) on their problem list.  Patient reports no bleeding, no contractions, no cramping, and no leaking.  Contractions: Not present. Vag. Bleeding: None.  Movement: Present. Denies leaking of fluid.   The following portions of the patient's history were reviewed and updated as appropriate: allergies, current medications, past family history, past medical history, past social history, past surgical history and problem list.   Objective:   Vitals:   10/16/24 1425  BP: 118/76  Pulse: 98  Weight: (!) 355 lb 3.2 oz (161.1 kg)    Fetal Status:  Fetal Heart Rate (bpm): 145   Movement: Present    General: Alert, oriented and cooperative. Patient is in no acute distress.  Skin: Skin is warm and dry. No rash noted.   Cardiovascular: Normal heart rate noted  Respiratory: Normal respiratory effort, no problems with respiration noted  Abdomen: Soft, gravid, appropriate for gestational age.  Pain/Pressure: Absent     Pelvic: Cervical exam deferred        Extremities: Normal range of motion.  Edema: None  Mental Status: Normal mood and  affect. Normal behavior. Normal judgment and thought content.      09/11/2024    8:37 AM 08/30/2024    2:02 PM 08/29/2024    1:31 PM  Depression screen PHQ 2/9  Decreased Interest 0    Down, Depressed, Hopeless 1    PHQ - 2 Score 1    Altered sleeping 1    Tired, decreased energy 1    Change in appetite 2    Feeling bad or failure about yourself  0    Trouble concentrating 0    Moving slowly or fidgety/restless 0    Suicidal thoughts 0    PHQ-9 Score 5     Difficult doing work/chores        Information is confidential and restricted. Go to Review Flowsheets to unlock data.   Data saved with a previous flowsheet row definition        09/11/2024    8:39 AM 08/30/2024    2:05 PM 08/29/2024    1:31 PM 08/09/2024   10:13 AM  GAD 7 : Generalized Anxiety Score  Nervous, Anxious, on Edge 3   1  Control/stop worrying 3   1  Worry too much - different things 3   1  Trouble relaxing 3   0  Restless 3   0  Easily annoyed or irritable 3   1  Afraid - awful might happen 1   1  Total GAD 7 Score 19   5  Anxiety Difficulty Somewhat difficult   Somewhat difficult     Information is confidential and restricted. Go to Review Flowsheets to unlock data.    Assessment  and Plan:  Pregnancy: G1P0 at [redacted]w[redacted]d  1. [redacted] weeks gestation of pregnancy - NST Reactive - on PNV and baby ASA - Pt passed 2hr GTT - Recheck CBC at next prenatal visit - US  (08/30/24): Vtx, posterior placenta, AFI wnl, EFW 1lb 14oz (40%). Follow with Growth US .  - US  (10/04/24): [redacted]w[redacted]d, vtx, posterior placenta, AFI wnl, EFW 3lb 9oz (36%), HC 7%, AC 66%, FL 7%, BPD 85%. Interval anatomy appears normal. There is either a cavum verge or arachnoid cyst in the midline of the brain. Follow-up scheduled.   2. Encounter for supervision of High Risk Pregnancy, 3rd Trimester   3. Asthma during pregnancy - Discharged home 09/24/24 after 1 week hospital stay due to asthma exacerbation - Pt reports using steroids, Symbicort , Albuterol  as  directed - Asthma stable at this time (10/16/24)   4. OSA (obstructive sleep apnea) - borderline.  Not prescribed CPAP. - has pulmonologist, Dr. Neda   5. LGSIL on Pap smear of cervix - planning to repeat PP   6. Severe bipolar I disorder, current or most recent episode depressed (HCC) - Pt doing well emotionally at this time. Has good support system.   7. History of depression   8. Maternal morbid obesity, antepartum (HCC)  - PreGravid BMI 50 - Most recent HgA1C 05/22/24  5.0 (Normal) - Baseline PreE labs completed - Thyroid  Panel With TSH (within normal ranges) - Antenatal testing planned (start at 34 weeks)  - 44lb total weight gain in pregnancy   9. Abnormal ultrasonic finding on antenatal screening of mother (cavum verge or arachnoid cyst)  - Pt has follow-up US  scheduled  Preterm labor symptoms and general obstetric precautions including but not limited to vaginal bleeding, contractions, leaking of fluid and fetal movement were reviewed in detail with the patient. Please refer to After Visit Summary for other counseling recommendations.   Future Appointments  Date Time Provider Department Center  10/18/2024 10:30 AM Collene Reginia BRAVO, PA [facility] None  10/19/2024 10:30 AM Luke Orlan HERO, DO [facility] None  10/23/2024  1:40 PM [facility] NST [facility] [address]  10/31/2024  2:30 PM [facility] NST [facility] [address]  10/31/2024  2:55 PM Delores Nidia CROME, FNP [facility] [address]  11/06/2024 10:00 AM [facility] SLEEP STUD[facility] [address]  11/07/2024  1:40 PM [facility] NST [facility] [address]  11/13/2024  2:00 PM Neda Jennet LABOR, MD [facility] [address]  11/14/2024  1:30 PM [facility] NST [facility] [address]  11/14/2024  1:55 PM Cleotilde Ronal RAMAN, MD [facility] [address]  11/28/2024  1:15 PM Delores Nidia CROME, FNP [facility] [address]  12/01/2024  1:15 PM [facility] PROVIDER 1 [facility]  12/01/2024  1:30 PM [facility] US4 [facility]   12/04/2024  9:55 AM Cleotilde Ronal RAMAN, MD [facility] [address]  12/11/2024  1:35 PM Cleotilde Ronal RAMAN, MD [facility] [phone] [address]  12/19/2024  1:55 PM Delores Nidia CROME, FNP [facility] [phone] [address]    Arland MARLA Roller, CNM

## 2024-10-18 ENCOUNTER — Encounter (HOSPITAL_COMMUNITY): Payer: MEDICAID | Admitting: Physician Assistant

## 2024-10-19 ENCOUNTER — Ambulatory Visit: Admitting: Allergy

## 2024-10-19 NOTE — Progress Notes (Deleted)
 New Patient Note  RE: Whitney Beard MRN: [id number] DOB: 2001-10-14 Date of Office Visit: 10/19/2024  Consult requested by: Tad Arland POUR, CNM Primary care provider: Billy Philippe SAUNDERS, NP  Chief Complaint: No chief complaint on file.  History of Present Illness: I had the pleasure of seeing Whitney Beard for initial evaluation at the [facility] on 10/19/2024. She is a 23 y.o. female, who is referred here by Lo, Arland POUR, CNM for the evaluation of ***.  Discussed the use of AI scribe software for clinical note transcription with the patient, who gave verbal consent to proceed.  History of Present Illness             ***  Assessment and Plan: Whitney Beard is a 23 y.o. female with: ***  Assessment and Plan               No follow-ups on file.  No orders of the defined types were placed in this encounter.  Lab Orders  No laboratory test(s) ordered today    Other allergy screening: Asthma: {Blank single:19197::yes,no} Rhino conjunctivitis: {Blank single:19197::yes,no} Food allergy: {Blank single:19197::yes,no} Medication allergy: {Blank single:19197::yes,no} Hymenoptera allergy: {Blank single:19197::yes,no} Urticaria: {Blank single:19197::yes,no} Eczema:{Blank single:19197::yes,no} History of recurrent infections suggestive of immunodeficency: {Blank single:19197::yes,no}  Diagnostics: Spirometry:  Tracings reviewed. Her effort: {Blank single:19197::Good reproducible efforts.,It was hard to get consistent efforts and there is a question as to whether this reflects a maximal maneuver.,Poor effort, data can not be interpreted.} FVC: ***L FEV1: ***L, ***% predicted FEV1/FVC ratio: ***% Interpretation: {Blank single:19197::Spirometry consistent with mild obstructive disease,Spirometry consistent with moderate obstructive disease,Spirometry consistent with severe obstructive disease,Spirometry consistent  with possible restrictive disease,Spirometry consistent with mixed obstructive and restrictive disease,Spirometry uninterpretable due to technique,Spirometry consistent with normal pattern,No overt abnormalities noted given today's efforts}.  Please see scanned spirometry results for details.  Skin Testing: {Blank single:19197::Select foods,Environmental allergy panel,Environmental allergy panel and select foods,Food allergy panel,None,Deferred due to recent antihistamines use}. *** Results discussed with patient/family.   Past Medical History: Patient Active Problem List   Diagnosis Date Noted  . Maternal morbid obesity in third trimester, antepartum (HCC) 10/16/2024  . Family history of albinism 08/01/2024  . LGSIL on Pap smear of cervix 07/05/2024  . Asthma exacerbation 06/24/2024  . History of prediabetes 06/23/2024  . H/O suicide attempt 05/23/2024  . Supervision of high risk pregnancy, antepartum 05/22/2024  . Asthma during pregnancy 05/22/2024  . Severe persistent allergic asthma (HCC) 03/03/2022  . Severe bipolar I disorder, current or most recent episode depressed (HCC) 11/11/2020  . Anxiety disorder, unspecified 11/11/2020  . Cannabis use disorder, mild, abuse 11/11/2020  . Borderline personality disorder (HCC) 11/11/2020  . Major depressive disorder 06/28/2020  . OSA (obstructive sleep apnea) 12/26/2018  . Class 3 severe obesity due to excess calories with serious comorbidity and body mass index (BMI) of 50.0 to 59.9 in adult [facility]) 12/26/2018   Past Medical History:  Diagnosis Date  . ADHD (attention deficit hyperactivity disorder)   . Allergic rhinitis   . Asthma   . BMI 45.0-49.9, adult (HCC)   . Borderline personality disorder (HCC)   . Migraines   . Obesity   . Prediabetes    states today 11/11/20 was told in the past that she was pre- diabetic  . Sleep apnea   . Suicide attempt [facility])    Past Surgical History: Past Surgical History:   Procedure Laterality Date  . ADENOIDECTOMY    . TONSILLECTOMY    .  WISDOM TOOTH EXTRACTION Bilateral    Medication List:  Current Outpatient Medications  Medication Sig Dispense Refill  . albuterol  (PROVENTIL ) (2.5 MG/3ML) 0.083% nebulizer solution Take 3 mLs (2.5 mg total) by nebulization every 4 (four) hours as needed for wheezing or shortness of breath. 75 mL 2  . albuterol  (VENTOLIN  HFA) 108 (90 Base) MCG/ACT inhaler Inhale 2 puffs into the lungs every 4 (four) hours as needed for wheezing or shortness of breath. 18 g 1  . aluminum-magnesium  hydroxide-simethicone  (MAALOX) 200-200-20 MG/5ML SUSP Take 30 mLs by mouth 3 (three) times daily as needed (upper abdominal pain/acid reflux). 1680 mL 2  . aspirin  EC 81 MG tablet Take 1 tablet (81 mg total) by mouth daily. Swallow whole.    . Blood Pressure Monitoring (BLOOD PRESSURE KIT) DEVI Check BP daily 1 each 0  . budesonide -formoterol  (SYMBICORT ) 80-4.5 MCG/ACT inhaler Inhale 2 puffs into the lungs.    . busPIRone  (BUSPAR ) 10 MG tablet Take 1 tablet (10 mg total) by mouth 3 (three) times daily as needed. (Patient taking differently: Take 10 mg by mouth 3 (three) times daily as needed (For anxiety).) 30 tablet 0  . cefadroxil (DURICEF) 500 MG capsule Take 1 capsule (500 mg total) by mouth 2 (two) times daily. 10 capsule 0  . famotidine  (PEPCID ) 20 MG tablet Take 1 tablet (20 mg total) by mouth 2 (two) times daily. 30 tablet 0  . NIFEdipine (PROCARDIA-XL/NIFEDICAL-XL) 30 MG 24 hr tablet Take 1 tablet (30 mg total) by mouth daily. 90 tablet 0  . nystatin cream (MYCOSTATIN) Apply thin layer twice a day for one week 30 g 0  . ondansetron  (ZOFRAN ) 4 MG tablet Take 1 tablet (4 mg total) by mouth every 8 (eight) hours as needed for nausea or vomiting. 30 tablet 3  . ondansetron  (ZOFRAN -ODT) 4 MG disintegrating tablet Take 1 tablet (4 mg total) by mouth every 8 (eight) hours as needed for nausea or vomiting. 15 tablet 0  . polyethylene glycol powder  (MIRALAX ) 17 GM/SCOOP powder Dissolve 1 capful (17g) in a large glass of water and take by mouth twice a day for 3-5 days until you are having regular bowel movements, then decrease to once daily after that for maintenance of daily bowel movements 238 g 2  . prenatal vitamin w/FE, FA (PRENATAL 1 + 1) 27-1 MG TABS tablet Take 1 tablet by mouth daily at 12 noon. 30 tablet 12   No current facility-administered medications for this visit.   Allergies: Allergies  Allergen Reactions  . Other Shortness Of Breath and Other (See Comments)    Animals such as cats and dogs and pollen - sneezing, asthma trigger    Social History: Social History   Socioeconomic History  . Marital status: Single    Spouse name: Not on file  . Number of children: 0  . Years of education: Not on file  . Highest education level: 12th grade  Occupational History  . Not on file  Tobacco Use  . Smoking status: Former    Current packs/day: 0.50    Types: Cigarettes  . Smokeless tobacco: Never  . Tobacco comments:    vapes   Vaping Use  . Vaping status: Former  Substance and Sexual Activity  . Alcohol use: Not Currently  . Drug use: Not Currently  . Sexual activity: Yes    Birth control/protection: None  Other Topics Concern  . Not on file  Social History Narrative   Lives with mom.    Social  Drivers of Health   Financial Resource Strain: Medium Risk (08/09/2024)   Overall Financial Resource Strain (CARDIA)   . Difficulty of Paying Living Expenses: Somewhat hard  Food Insecurity: No Food Insecurity (09/18/2024)   [facility]   Hunger Vital Sign   . Within the past 12 months, you worried that your food would run out before you got the money to buy more.: Never true   . Within the past 12 months, the food you bought just didn't last and you didn't have money to get more.: Never true  Recent Concern: Food Insecurity - Food Insecurity Present (08/09/2024)   Hunger Vital Sign   . Worried About  Programme Researcher, Broadcasting/film/video in the Last Year: Sometimes true   . Ran Out of Food in the Last Year: Never true  Transportation Needs: No Transportation Needs (09/19/2024)   Received from [facility] - Transportation   . In the past 12 months, has lack of transportation kept you from medical appointments or from getting medications?: No   . In the past 12 months, has lack of transportation kept you from meetings, work, or from getting things needed for daily living?: No  Physical Activity: Insufficiently Active (08/09/2024)   Exercise Vital Sign   . Days of Exercise per Week: 3 days   . Minutes of Exercise per Session: 20 min  Stress: No Stress Concern Present (09/18/2024)   Received from [facility] of [facility]ational [facility] - Occupational Stress Questionnaire   . Do you feel stress - tense, restless, nervous, or anxious, or unable to sleep at night because your mind is troubled all the time - these days?: Only a little  Recent Concern: Stress - Stress Concern Present (08/09/2024)   [employer] of Occupational Health - Occupational Stress Questionnaire   . Feeling of Stress: Rather much  Social Connections: Moderately Isolated (08/09/2024)   Social Connection and Isolation Panel   . Frequency of Communication with Friends and Family: More than three times a week   . Frequency of Social Gatherings with Friends and Family: More than three times a week   . Attends Religious Services: 1 to 4 times per year   . Active Member of Clubs or Organizations: No   . Attends Banker Meetings: Not on file   . Marital Status: Never married   Lives in a ***. Smoking: *** Occupation: ***  Environmental HistorySurveyor, Minerals in the house: Network Engineer in the family room: {Blank single:19197::yes,no} Carpet in the bedroom: {Blank single:19197::yes,no} Heating: {Blank single:19197::electric,gas,heat pump} Cooling:  {Blank single:19197::central,window,heat pump} Pet: {Blank single:19197::yes ***,no}  Family History: Family History  Problem Relation Age of Onset  . Bipolar disorder Father   . Schizophrenia Father   . Liver disease Father   . Asthma Father   . SIDS Maternal Aunt   . Asthma Maternal Grandmother   . Schizophrenia Maternal Grandmother   . Heart disease Paternal Grandmother   . Diabetes Paternal Grandmother   . Seizures Paternal Grandmother    Problem                               Relation Asthma                                   *** Eczema                                ***  Food allergy                          *** Allergic rhino conjunctivitis     ***  Review of Systems  Constitutional:  Negative for appetite change, chills, fever and unexpected weight change.  HENT:  Negative for congestion and rhinorrhea.   Eyes:  Negative for itching.  Respiratory:  Negative for cough, chest tightness, shortness of breath and wheezing.   Cardiovascular:  Negative for chest pain.  Gastrointestinal:  Negative for abdominal pain.  Genitourinary:  Negative for difficulty urinating.  Skin:  Negative for rash.  Neurological:  Negative for headaches.    Objective: LMP 02/25/2024 (Approximate)  There is no height or weight on file to calculate BMI. Physical Exam Vitals and nursing note reviewed.  Constitutional:      Appearance: Normal appearance. She is well-developed.  HENT:     Head: Normocephalic and atraumatic.     Right Ear: Tympanic membrane and external ear normal.     Left Ear: Tympanic membrane and external ear normal.     Nose: Nose normal.     Mouth/Throat:     Mouth: Mucous membranes are moist.     Pharynx: Oropharynx is clear.  Eyes:     Conjunctiva/sclera: Conjunctivae normal.  Cardiovascular:     Rate and Rhythm: Normal rate and regular rhythm.     Heart sounds: Normal heart sounds. No murmur heard.    No friction rub. No gallop.  Pulmonary:     Effort:  Pulmonary effort is normal.     Breath sounds: Normal breath sounds. No wheezing, rhonchi or rales.  Musculoskeletal:     Cervical back: Neck supple.  Skin:    General: Skin is warm.     Findings: No rash.  Neurological:     Mental Status: She is alert and oriented to person, place, and time.  Psychiatric:        Behavior: Behavior normal.   The plan was reviewed with the patient/family, and all questions/concerned were addressed.  It was my pleasure to see Whitney Beard today and participate in her care. Please feel free to contact me with any questions or concerns.  Sincerely,  Orlan Cramp, DO Allergy & Immunology  [facility]  [place] office: [phone] [facility] office: [phone]

## 2024-10-23 ENCOUNTER — Ambulatory Visit (INDEPENDENT_AMBULATORY_CARE_PROVIDER_SITE_OTHER): Payer: MEDICAID

## 2024-10-23 ENCOUNTER — Encounter (HOSPITAL_BASED_OUTPATIENT_CLINIC_OR_DEPARTMENT_OTHER): Payer: Self-pay

## 2024-10-23 DIAGNOSIS — O99213 Obesity complicating pregnancy, third trimester: Secondary | ICD-10-CM | POA: Diagnosis not present

## 2024-10-23 DIAGNOSIS — Z3A33 33 weeks gestation of pregnancy: Secondary | ICD-10-CM

## 2024-10-23 NOTE — Progress Notes (Signed)
 NURSE VISIT- NST  SUBJECTIVE:  Whitney Beard is a 23 y.o. G1P0 female at [redacted]w[redacted]d, here for a NST for pregnancy complicated by Morbid obesity (BMI >=40).  She reports active fetal movement, contractions: none, vaginal bleeding: none, membranes: intact.   OBJECTIVE:  BP 121/79   Pulse 88   Wt (!) 358 lb (162.4 kg)   LMP 02/25/2024 (Approximate)   BMI 57.78 kg/m   Appears well, no apparent distress  No results found for this or any previous visit (from the past 24 hours).  NST: FHR baseline 135 bpm, Variability: moderate, Accelerations:present, Decelerations: not present Toco: none   ASSESSMENT: G1P0 at [redacted]w[redacted]d with Morbid obesity (BMI >=40) NST reactive  PLAN: EFM strip reviewed by Morna Quale, RN and Arland Roller, CNM Recommendations: keep next appointment as scheduled  Morna LOISE Quale, RN

## 2024-10-31 ENCOUNTER — Ambulatory Visit (HOSPITAL_BASED_OUTPATIENT_CLINIC_OR_DEPARTMENT_OTHER): Payer: MEDICAID | Admitting: Obstetrics

## 2024-10-31 ENCOUNTER — Ambulatory Visit (INDEPENDENT_AMBULATORY_CARE_PROVIDER_SITE_OTHER): Payer: MEDICAID | Admitting: Obstetrics and Gynecology

## 2024-10-31 ENCOUNTER — Other Ambulatory Visit (HOSPITAL_COMMUNITY)
Admission: RE | Admit: 2024-10-31 | Discharge: 2024-10-31 | Disposition: A | Payer: MEDICAID | Source: Ambulatory Visit | Attending: Certified Nurse Midwife | Admitting: Certified Nurse Midwife

## 2024-10-31 ENCOUNTER — Ambulatory Visit: Payer: MEDICAID

## 2024-10-31 ENCOUNTER — Ambulatory Visit (HOSPITAL_BASED_OUTPATIENT_CLINIC_OR_DEPARTMENT_OTHER): Payer: Self-pay

## 2024-10-31 VITALS — BP 145/78

## 2024-10-31 VITALS — BP 120/82 | HR 89 | Wt 362.0 lb

## 2024-10-31 DIAGNOSIS — E669 Obesity, unspecified: Secondary | ICD-10-CM

## 2024-10-31 DIAGNOSIS — J45909 Unspecified asthma, uncomplicated: Secondary | ICD-10-CM

## 2024-10-31 DIAGNOSIS — N898 Other specified noninflammatory disorders of vagina: Secondary | ICD-10-CM | POA: Insufficient documentation

## 2024-10-31 DIAGNOSIS — O0990 Supervision of high risk pregnancy, unspecified, unspecified trimester: Secondary | ICD-10-CM

## 2024-10-31 DIAGNOSIS — O99713 Diseases of the skin and subcutaneous tissue complicating pregnancy, third trimester: Secondary | ICD-10-CM | POA: Diagnosis not present

## 2024-10-31 DIAGNOSIS — E66813 Obesity, class 3: Secondary | ICD-10-CM

## 2024-10-31 DIAGNOSIS — G4733 Obstructive sleep apnea (adult) (pediatric): Secondary | ICD-10-CM | POA: Diagnosis not present

## 2024-10-31 DIAGNOSIS — O99353 Diseases of the nervous system complicating pregnancy, third trimester: Secondary | ICD-10-CM | POA: Diagnosis not present

## 2024-10-31 DIAGNOSIS — Z6841 Body Mass Index (BMI) 40.0 and over, adult: Secondary | ICD-10-CM

## 2024-10-31 DIAGNOSIS — O99213 Obesity complicating pregnancy, third trimester: Secondary | ICD-10-CM

## 2024-10-31 DIAGNOSIS — Z3A34 34 weeks gestation of pregnancy: Secondary | ICD-10-CM

## 2024-10-31 DIAGNOSIS — O99513 Diseases of the respiratory system complicating pregnancy, third trimester: Secondary | ICD-10-CM

## 2024-10-31 DIAGNOSIS — L2989 Other pruritus: Secondary | ICD-10-CM

## 2024-10-31 DIAGNOSIS — O283 Abnormal ultrasonic finding on antenatal screening of mother: Secondary | ICD-10-CM | POA: Diagnosis not present

## 2024-10-31 MED ORDER — TERCONAZOLE 0.4 % VA CREA: 1.0000 | TOPICAL_CREAM | Freq: Every day | VAGINAL | 0 refills | Status: AC

## 2024-10-31 NOTE — Progress Notes (Signed)
 Patient reports some vaginal irritation. She also reports vaginal itching and a thick brownish discharge.

## 2024-10-31 NOTE — Progress Notes (Signed)
 PRENATAL VISIT NOTE  Subjective:  Whitney Beard is a 23 y.o. G1P0 at [redacted]w[redacted]d being seen today for ongoing prenatal care.  She is currently monitored for the following issues for this high-risk pregnancy and has OSA (obstructive sleep apnea); Class 3 severe obesity due to excess calories with serious comorbidity and body mass index (BMI) of 50.0 to 59.9 in adult [facility]); Major depressive disorder; Severe bipolar I disorder, current or most recent episode depressed (HCC); Anxiety disorder, unspecified; Cannabis use disorder, mild, abuse; Borderline personality disorder (HCC); Severe persistent allergic asthma (HCC); Supervision of high risk pregnancy, antepartum; Asthma during pregnancy; H/O suicide attempt; History of prediabetes; Asthma exacerbation; LGSIL on Pap smear of cervix; Family history of albinism; and Maternal morbid obesity in third trimester, antepartum (HCC) on their problem list.  Patient reports vaginal itching she noticed two days ago.  Contractions: Irregular. Vag. Bleeding: None.  Movement: Present. Denies leaking of fluid.   The following portions of the patient's history were reviewed and updated as appropriate: allergies, current medications, past family history, past medical history, past social history, past surgical history and problem list.   Objective:   Vitals:   10/31/24 1458  BP: 120/82  Pulse: 89  Weight: (!) 362 lb (164.2 kg)    Fetal Status:  Fetal Heart Rate (bpm): 153   Movement: Present    General: Alert, oriented and cooperative. Patient is in no acute distress.  Skin: Skin is warm and dry. No rash noted.   Cardiovascular: Normal heart rate noted  Respiratory: Normal respiratory effort, no problems with respiration noted  Abdomen: Soft, gravid, appropriate for gestational age.  Pain/Pressure: Absent     Pelvic: Cervical exam deferred        Extremities: Normal range of motion.  Edema: None  Mental Status: Normal mood and affect. Normal behavior.  Normal judgment and thought content.   Assessment and Plan:  Pregnancy: G1P0 at [redacted]w[redacted]d 1. Supervision of high risk pregnancy, antepartum (Primary) BP and FHR normal Doing well, feeling regular movement    2. Vaginal itching  - Cervicovaginal ancillary only( [place])  3. Class 3 severe obesity due to excess calories with serious comorbidity and body mass index (BMI) of 50.0 to 59.9 in adult (HCC) 12/2 EFW 31%, afi normal, bpp 8/8 Continue growth u/s Antenatal testing start 34 weeks, NSTs [facility] be done at [facility] visits Per MFM IOL at 39 weeks, scheduled today 1/2, admission orders placed  4. Asthma during pregnancy Has symbicort , albuterol , stable today   5. OSA (obstructive sleep apnea) Sleep study 12/8  6. [redacted] weeks gestation of pregnancy Anticipatory guidance regarding swabs next visit   7. Abnormal finding ultrasound 12/2 cystic area in fetal head, per MFM plan ultrasound of baby head after delivery    Preterm labor symptoms and general obstetric precautions including but not limited to vaginal bleeding, contractions, leaking of fluid and fetal movement were reviewed in detail with the patient. Please refer to After Visit Summary for other counseling recommendations.    Future Appointments  Date Time Provider Department Center  11/06/2024 10:00 AM [facility] HOME SLEEP STUDY [facility] [address]  11/07/2024  1:40 PM [facility] NST [facility] [address]  11/13/2024  1:40 PM [facility] NST [facility] [address]  11/13/2024  1:55 PM Cleotilde Ronal RAMAN, MD [facility] [address]  11/13/2024  2:00 PM Neda Jennet LABOR, MD [facility] [address]  11/15/2024 11:30 AM Collene, Reginia BRAVO, PA [facility] None  11/28/2024  1:15  PM Delores Nidia CROME, FNP [facility] [address]  12/01/2024  1:15 PM [facility] PROVIDER 1 [facility]  12/01/2024  1:30 PM [facility] US2 [facility]  12/04/2024  9:55 AM Cleotilde Ronal RAMAN, MD [facility] [address]  12/11/2024  1:35 PM Cleotilde Ronal RAMAN, MD [facility]  [phone]  12/19/2024  1:55 PM Delores Nidia CROME, FNP [facility] [phone] [address]    Nidia Delores, FNP

## 2024-10-31 NOTE — Progress Notes (Signed)
 MFM Consult Note  Whitney Beard is currently at [redacted]w[redacted]d. She has been followed due to maternal obesity with a BMI of 51.2.    A possible cystic area was noted in the fetal head during her prior ultrasound exam.    She denies any problems since her last exam and has screened negative for gestational diabetes.    Her blood pressure today was 145/78.  Sonographic findings Single intrauterine pregnancy at 34w 4d.  Fetal cardiac activity:  Observed and appears normal. Presentation: Cephalic. The views of the fetal anatomy were limited today due to maternal body habitus.  The cystic area in the fetal head was visualized today.  She was advised that this may be a normal variant.  Her baby will need imaging studies of the head after birth. Fetal biometry shows the estimated fetal weight of 5 lb 3 oz,  2348g (31%). Amniotic fluid volume: Within normal limits. AFI: 14.29cm.  MVP: 4.52 cm. Placenta: Posterior. BPP: 8/8.   Due to maternal obesity, she should continue weekly fetal testing until delivery.  The NSTs may be performed at [facility].  Delivery should occur at around 39 weeks.  The patient will discuss scheduling an induction with you during her next prenatal visit.  She will return for another growth scan and BPP in 4 weeks.  The patient stated that all of her questions were answered.   A total of 20 minutes was spent counseling and coordinating the care for this patient.  Greater than 50% of the time was spent in direct face-to-face contact.

## 2024-11-01 LAB — CERVICOVAGINAL ANCILLARY ONLY
Bacterial Vaginitis (gardnerella): POSITIVE — AB
Candida Glabrata: NEGATIVE
Candida Vaginitis: POSITIVE — AB
Comment: NEGATIVE
Comment: NEGATIVE
Comment: NEGATIVE

## 2024-11-02 NOTE — Addendum Note (Signed)
 Addended by: DELORES NIDIA CROME on: 11/02/2024 08:13 AM   Modules accepted: Orders

## 2024-11-06 ENCOUNTER — Ambulatory Visit: Payer: Self-pay | Admitting: Obstetrics and Gynecology

## 2024-11-06 MED ORDER — METRONIDAZOLE 500 MG PO TABS: 500.0000 mg | ORAL_TABLET | Freq: Two times a day (BID) | ORAL | 0 refills | Status: AC

## 2024-11-07 ENCOUNTER — Ambulatory Visit (HOSPITAL_BASED_OUTPATIENT_CLINIC_OR_DEPARTMENT_OTHER): Payer: Self-pay

## 2024-11-07 ENCOUNTER — Ambulatory Visit (HOSPITAL_BASED_OUTPATIENT_CLINIC_OR_DEPARTMENT_OTHER): Payer: MEDICAID | Admitting: Obstetrics and Gynecology

## 2024-11-07 VITALS — BP 120/73 | HR 105 | Wt 369.0 lb

## 2024-11-07 DIAGNOSIS — M549 Dorsalgia, unspecified: Secondary | ICD-10-CM

## 2024-11-07 DIAGNOSIS — Z3A35 35 weeks gestation of pregnancy: Secondary | ICD-10-CM | POA: Diagnosis not present

## 2024-11-07 DIAGNOSIS — E66813 Obesity, class 3: Secondary | ICD-10-CM

## 2024-11-07 DIAGNOSIS — O2693 Pregnancy related conditions, unspecified, third trimester: Secondary | ICD-10-CM

## 2024-11-07 DIAGNOSIS — O99213 Obesity complicating pregnancy, third trimester: Secondary | ICD-10-CM

## 2024-11-07 DIAGNOSIS — O0990 Supervision of high risk pregnancy, unspecified, unspecified trimester: Secondary | ICD-10-CM

## 2024-11-07 MED ORDER — CYCLOBENZAPRINE HCL 10 MG PO TABS: 10.0000 mg | ORAL_TABLET | Freq: Three times a day (TID) | ORAL | 0 refills | Status: DC | PRN

## 2024-11-07 NOTE — Progress Notes (Unsigned)
 PRENATAL VISIT NOTE  Subjective:  Whitney Beard is a 23 y.o. G1P0 at [redacted]w[redacted]d being seen today for ongoing prenatal care.  She is currently monitored for the following issues for this {Blank single:19197::high-risk,low-risk} pregnancy and has OSA (obstructive sleep apnea); Class 3 severe obesity due to excess calories with serious comorbidity and body mass index (BMI) of 50.0 to 59.9 in adult [facility]); Major depressive disorder; Severe bipolar I disorder, current or most recent episode depressed (HCC); Anxiety disorder, unspecified; Cannabis use disorder, mild, abuse; Borderline personality disorder (HCC); Severe persistent allergic asthma (HCC); Supervision of high risk pregnancy, antepartum; Asthma during pregnancy; H/O suicide attempt; History of prediabetes; Asthma exacerbation; LGSIL on Pap smear of cervix; Family history of albinism; and Maternal morbid obesity in third trimester, antepartum (HCC) on their problem list.  Patient reports back pain started last Thursday. Progressively worse over weekend. Hardly get out of bed yesterday. If pressure on left leg feels its going to give out. Feels like pinched nerve but has not done anything to cause. Has tried tylenol  and heating pack and ice. Ice helped some, but tylenol  and heat did not. Denies vaginal bleeding , denies abdominal pain. .  Contractions: Not present. Vag. Bleeding: None.  Movement: Present. Denies leaking of fluid.   The following portions of the patient's history were reviewed and updated as appropriate: allergies, current medications, past family history, past medical history, past social history, past surgical history and problem list.   Objective:   Vitals:   11/07/24 1526  BP: 120/73  Pulse: (!) 105  Weight: (!) 369 lb (167.4 kg)    Fetal Status:  Fetal Heart Rate (bpm): 160   Movement: Present    General: Alert, oriented and cooperative. Patient is in no acute distress.  Skin: Skin is warm and dry. No rash noted.    Cardiovascular: Normal heart rate noted  Respiratory: Normal respiratory effort, no problems with respiration noted  Abdomen: Soft, gravid, appropriate for gestational age.  Pain/Pressure: Present (lower back pain (pinched nerve feeling))     Pelvic: {Blank single:19197::Cervical exam performed in the presence of a chaperone,Cervical exam deferred}        Extremities: Normal range of motion.  Edema: None  Mental Status: Normal mood and affect. Normal behavior. Normal judgment and thought content.      09/11/2024    8:37 AM 08/30/2024    2:02 PM 08/29/2024    1:31 PM  Depression screen PHQ 2/9  Decreased Interest 0    Down, Depressed, Hopeless 1    PHQ - 2 Score 1    Altered sleeping 1    Tired, decreased energy 1    Change in appetite 2    Feeling bad or failure about yourself  0    Trouble concentrating 0    Moving slowly or fidgety/restless 0    Suicidal thoughts 0    PHQ-9 Score 5     Difficult doing work/chores        Information is confidential and restricted. Go to Review Flowsheets to unlock data.   Data saved with a previous flowsheet row definition        09/11/2024    8:39 AM 08/30/2024    2:05 PM 08/29/2024    1:31 PM 08/09/2024   10:13 AM  GAD 7 : Generalized Anxiety Score  Nervous, Anxious, on Edge 3   1  Control/stop worrying 3   1  Worry too much - different things 3   1  Trouble  relaxing 3   0  Restless 3   0  Easily annoyed or irritable 3   1  Afraid - awful might happen 1   1  Total GAD 7 Score 19   5  Anxiety Difficulty Somewhat difficult   Somewhat difficult     Information is confidential and restricted. Go to Review Flowsheets to unlock data.    Assessment and Plan:  Pregnancy: G1P0 at [redacted]w[redacted]d 1. Supervision of high risk pregnancy, antepartum (Primary) Other medical hx addressed at appt 12/2 here for back pain  2. [redacted] weeks gestation of pregnancy ***  3. Back pain during pregnancy Supportive measures, stretches, tylenol  encouraged to take  2 extra srength or 2 regular as needed, ice and heat alternate, stretches   4. Class 3 severe obesity due to excess calories with serious comorbidity and body mass index (BMI) of 50.0 to 59.9 in adult [facility])    {Blank single:19197::Term,Preterm} labor symptoms and general obstetric precautions including but not limited to vaginal bleeding, contractions, leaking of fluid and fetal movement were reviewed in detail with the patient. Please refer to After Visit Summary for other counseling recommendations.   No follow-ups on file.  Future Appointments  Date Time Provider Department Center  11/13/2024  1:40 PM [facility] O[facility] NST [facility] [address]  11/13/2024  1:55 PM Cleotilde Ronal RAMAN, MD [facility] [address]  11/13/2024  2:00 PM Neda Jennet LABOR, MD [facility] [address]  11/15/2024 11:00 AM [facility] HOME SLEEP STUDY [facility] [address]  11/15/2024 11:30 AM Nwoko, Reginia BRAVO, PA [facility] None  11/28/2024  1:15 PM Delores Nidia CROME, FNP [facility] [address]  12/01/2024  6:45 AM MC-LD SCHED ROOM MC-INDC None  12/01/2024  1:15 PM [facility] PROVIDER 1 [facility]  12/01/2024  1:30 PM [facility] US2 [facility]  12/04/2024  9:55 AM Cleotilde Ronal RAMAN, MD [facility] [address]  12/11/2024  1:35 PM Cleotilde Ronal RAMAN, MD [facility] [phone] [address]  12/19/2024  1:55 PM Delores Nidia CROME, FNP [facility] [phone] [address]    Nidia Delores, FNP

## 2024-11-12 ENCOUNTER — Encounter (HOSPITAL_BASED_OUTPATIENT_CLINIC_OR_DEPARTMENT_OTHER): Payer: Self-pay

## 2024-11-13 ENCOUNTER — Ambulatory Visit: Payer: MEDICAID | Admitting: Pulmonary Disease

## 2024-11-13 ENCOUNTER — Encounter (HOSPITAL_BASED_OUTPATIENT_CLINIC_OR_DEPARTMENT_OTHER): Payer: MEDICAID | Admitting: Obstetrics & Gynecology

## 2024-11-13 ENCOUNTER — Ambulatory Visit (HOSPITAL_BASED_OUTPATIENT_CLINIC_OR_DEPARTMENT_OTHER)

## 2024-11-14 ENCOUNTER — Encounter (HOSPITAL_BASED_OUTPATIENT_CLINIC_OR_DEPARTMENT_OTHER): Payer: MEDICAID | Admitting: Obstetrics & Gynecology

## 2024-11-14 ENCOUNTER — Ambulatory Visit (HOSPITAL_BASED_OUTPATIENT_CLINIC_OR_DEPARTMENT_OTHER): Payer: Self-pay

## 2024-11-14 ENCOUNTER — Ambulatory Visit (HOSPITAL_BASED_OUTPATIENT_CLINIC_OR_DEPARTMENT_OTHER): Payer: MEDICAID | Admitting: Obstetrics and Gynecology

## 2024-11-14 ENCOUNTER — Encounter (HOSPITAL_BASED_OUTPATIENT_CLINIC_OR_DEPARTMENT_OTHER): Payer: Self-pay

## 2024-11-14 ENCOUNTER — Ambulatory Visit (HOSPITAL_BASED_OUTPATIENT_CLINIC_OR_DEPARTMENT_OTHER)

## 2024-11-14 ENCOUNTER — Other Ambulatory Visit (HOSPITAL_COMMUNITY)
Admission: RE | Admit: 2024-11-14 | Discharge: 2024-11-14 | Disposition: A | Discharge status: Home / Self Care | Payer: MEDICAID | Source: Ambulatory Visit | Attending: Obstetrics and Gynecology | Admitting: Obstetrics and Gynecology

## 2024-11-14 ENCOUNTER — Other Ambulatory Visit (HOSPITAL_BASED_OUTPATIENT_CLINIC_OR_DEPARTMENT_OTHER): Payer: MEDICAID

## 2024-11-14 VITALS — BP 121/78 | HR 104 | Wt 376.0 lb

## 2024-11-14 DIAGNOSIS — O09893 Supervision of other high risk pregnancies, third trimester: Secondary | ICD-10-CM | POA: Diagnosis not present

## 2024-11-14 DIAGNOSIS — O3509X0 Maternal care for (suspected) other central nervous system malformation or damage in fetus, not applicable or unspecified: Secondary | ICD-10-CM | POA: Diagnosis not present

## 2024-11-14 DIAGNOSIS — Z113 Encounter for screening for infections with a predominantly sexual mode of transmission: Secondary | ICD-10-CM | POA: Insufficient documentation

## 2024-11-14 DIAGNOSIS — E66813 Obesity, class 3: Secondary | ICD-10-CM

## 2024-11-14 DIAGNOSIS — O09213 Supervision of pregnancy with history of pre-term labor, third trimester: Secondary | ICD-10-CM

## 2024-11-14 DIAGNOSIS — O0990 Supervision of high risk pregnancy, unspecified, unspecified trimester: Secondary | ICD-10-CM

## 2024-11-14 DIAGNOSIS — Z3A36 36 weeks gestation of pregnancy: Secondary | ICD-10-CM | POA: Insufficient documentation

## 2024-11-14 DIAGNOSIS — O283 Abnormal ultrasonic finding on antenatal screening of mother: Secondary | ICD-10-CM

## 2024-11-14 NOTE — Progress Notes (Unsigned)
° °  PRENATAL VISIT NOTE  Subjective:  Whitney Beard is a 23 y.o. G1P0 at [redacted]w[redacted]d being seen today for ongoing prenatal care.  She is currently monitored for the following issues for this low-risk pregnancy and has OSA (obstructive sleep apnea); Class 3 severe obesity due to excess calories with serious comorbidity and body mass index (BMI) of 50.0 to 59.9 in adult [facility]); Major depressive disorder; Severe bipolar I disorder, current or most recent episode depressed (HCC); Anxiety disorder, unspecified; Cannabis use disorder, mild, abuse; Borderline personality disorder (HCC); Severe persistent allergic asthma (HCC); Supervision of high risk pregnancy, antepartum; Asthma during pregnancy; H/O suicide attempt; History of prediabetes; Asthma exacerbation; LGSIL on Pap smear of cervix; Family history of albinism; and Maternal morbid obesity in third trimester, antepartum (HCC) on their problem list.  Patient reports back pain has much improved. Some intermittent pelvic pressure, stretches help .  Contractions: Not present. Vag. Bleeding: None.  Movement: Present. Denies leaking of fluid.   The following portions of the patient's history were reviewed and updated as appropriate: allergies, current medications, past family history, past medical history, past social history, past surgical history and problem list.   Objective:   Vitals:   11/14/24 1410  BP: 121/78  Pulse: (!) 104  Weight: (!) 376 lb (170.6 kg)    Fetal Status:  Fetal Heart Rate (bpm): 146   Movement: Present    General: Alert, oriented and cooperative. Patient is in no acute distress.  Skin: Skin is warm and dry. No rash noted.   Cardiovascular: Normal heart rate noted  Respiratory: Normal respiratory effort, no problems with respiration noted  Abdomen: Soft, gravid, appropriate for gestational age.  Pain/Pressure: Present     Pelvic: Cervical exam deferred        Extremities: Normal range of motion.  Edema: None  Mental Status:  Normal mood and affect. Normal behavior. Normal judgment and thought content.   Assessment and Plan:  Pregnancy: G1P0 at [redacted]w[redacted]d 1. Supervision of high risk pregnancy, antepartum (Primary) BP and FHR normal Feeling regular movement  Continue stretches and supportive measures   2. Class 3 severe obesity due to excess calories with serious comorbidity and body mass index (BMI) of 50.0 to 59.9 in adult (HCC) IOL scheduled 1/2 NST reactive  3. [redacted] weeks gestation of pregnancy Swabs today  Labor precautions  4. Abnormal finding ultrasound Cystic area fetal head, plan fetal ultrasound after birth    Preterm labor symptoms and general obstetric precautions including but not limited to vaginal bleeding, contractions, leaking of fluid and fetal movement were reviewed in detail with the patient. Please refer to After Visit Summary for other counseling recommendations.   Future Appointments  Date Time Provider Department Center  11/15/2024 11:00 AM [facility] HOME SLEEP STUDY [facility] [address]  11/15/2024 11:30 AM Nwoko, Reginia BRAVO, PA [facility] None  11/17/2024 10:30 AM Neda Jennet LABOR, MD [facility] [address]  11/28/2024  1:15 PM Delores Nidia CROME, FNP [facility] [address]  12/01/2024  6:45 AM MC-LD SCHED ROOM MC-INDC None  12/01/2024  1:15 PM [facility] PROVIDER 1 [facility]  12/01/2024  1:30 PM [facility] US2 [facility]    Nidia Delores, FNP

## 2024-11-15 ENCOUNTER — Encounter (HOSPITAL_COMMUNITY): Payer: MEDICAID | Admitting: Physician Assistant

## 2024-11-15 ENCOUNTER — Encounter: Payer: Self-pay | Admitting: Pulmonary Disease

## 2024-11-15 ENCOUNTER — Encounter (HOSPITAL_COMMUNITY): Payer: Self-pay

## 2024-11-16 LAB — CERVICOVAGINAL ANCILLARY ONLY
Chlamydia: NEGATIVE
Comment: NEGATIVE
Comment: NORMAL
Neisseria Gonorrhea: NEGATIVE

## 2024-11-17 ENCOUNTER — Ambulatory Visit (INDEPENDENT_AMBULATORY_CARE_PROVIDER_SITE_OTHER): Payer: MEDICAID | Admitting: Pulmonary Disease

## 2024-11-17 ENCOUNTER — Ambulatory Visit: Payer: Self-pay | Admitting: Obstetrics and Gynecology

## 2024-11-17 ENCOUNTER — Encounter: Payer: Self-pay | Admitting: Pulmonary Disease

## 2024-11-17 ENCOUNTER — Telehealth (HOSPITAL_COMMUNITY): Payer: Self-pay | Admitting: *Deleted

## 2024-11-17 ENCOUNTER — Encounter (HOSPITAL_COMMUNITY): Payer: Self-pay | Admitting: *Deleted

## 2024-11-17 VITALS — BP 129/84 | HR 115 | Temp 98.0°F | Ht 66.0 in | Wt 383.6 lb

## 2024-11-17 DIAGNOSIS — J45909 Unspecified asthma, uncomplicated: Secondary | ICD-10-CM | POA: Diagnosis not present

## 2024-11-17 DIAGNOSIS — Z87891 Personal history of nicotine dependence: Secondary | ICD-10-CM | POA: Diagnosis not present

## 2024-11-17 DIAGNOSIS — J4540 Moderate persistent asthma, uncomplicated: Secondary | ICD-10-CM

## 2024-11-17 NOTE — Patient Instructions (Signed)
 Follow-up in about 3 months depending on how things are at that time  Continue using your Symbicort , have albuterol  available to use as needed  We can revisit sleep apnea if you are still having sleep problems once you have lost some of the pregnancy associated weight gain  Call us  with any significant concerns  Happy holidays, good luck with the delivery

## 2024-11-17 NOTE — Progress Notes (Signed)
 "              Whitney Beard    [id number]    28-Feb-2001  Primary Care Physician:Lo, Arland POUR, CNM  Referring Physician: Tad Arland POUR, CNM [address] [place],  [place] [address]  Chief complaint:   Patient with asthma -Controlled symptoms -Third-trimester pregnancy  HPI:  She has been doing well Decreased need for albuterol  - Continues to use Symbicort  2 puffs twice a day  Sleep is about the same Concern for sleep disordered breathing -When she was recently in the hospital there was concern for sleep disordered breathing with a recommendation to have a sleep study performed  Sleep habits are about the same goes to bed between 10 and 11, difficulty falling asleep, multiple awakenings - The worsening may be related to advanced pregnancy  Overall, asthma is better controlled Does have history of bipolar disorder, major depression, anxiety, borderline personality disorder  Never smoker, no history of significant alcohol use  Outpatient Encounter Medications as of 11/17/2024  Medication Sig   albuterol  (PROVENTIL ) (2.5 MG/3ML) 0.083% nebulizer solution Take 3 mLs (2.5 mg total) by nebulization every 4 (four) hours as needed for wheezing or shortness of breath.   albuterol  (VENTOLIN  HFA) 108 (90 Base) MCG/ACT inhaler Inhale 2 puffs into the lungs every 4 (four) hours as needed for wheezing or shortness of breath.   aluminum -magnesium  hydroxide-simethicone  (MAALOX) 200-200-20 MG/5ML SUSP Take 30 mLs by mouth 3 (three) times daily as needed (upper abdominal pain/acid reflux).   aspirin  EC 81 MG tablet Take 1 tablet (81 mg total) by mouth daily. Swallow whole.   Blood Pressure Monitoring (BLOOD PRESSURE KIT) DEVI Check BP daily   budesonide -formoterol  (SYMBICORT ) 80-4.5 MCG/ACT inhaler Inhale 2 puffs into the lungs.   busPIRone  (BUSPAR ) 10 MG tablet Take 1 tablet (10 mg total) by mouth 3 (three) times daily as needed.   cyclobenzaprine  (FLEXERIL ) 10 MG tablet Take 1 tablet (10  mg total) by mouth every 8 (eight) hours as needed for muscle spasms.   famotidine  (PEPCID ) 20 MG tablet Take 1 tablet (20 mg total) by mouth 2 (two) times daily.   NIFEdipine  (PROCARDIA -XL/NIFEDICAL-XL) 30 MG 24 hr tablet Take 1 tablet (30 mg total) by mouth daily.   nystatin  cream (MYCOSTATIN ) Apply thin layer twice a day for one week   ondansetron  (ZOFRAN ) 4 MG tablet Take 1 tablet (4 mg total) by mouth every 8 (eight) hours as needed for nausea or vomiting.   ondansetron  (ZOFRAN -ODT) 4 MG disintegrating tablet Take 1 tablet (4 mg total) by mouth every 8 (eight) hours as needed for nausea or vomiting.   polyethylene glycol powder (MIRALAX ) 17 GM/SCOOP powder Dissolve 1 capful (17g) in a large glass of water and take by mouth twice a day for 3-5 days until you are having regular bowel movements, then decrease to once daily after that for maintenance of daily bowel movements   prenatal vitamin w/FE, FA (PRENATAL 1 + 1) 27-1 MG TABS tablet Take 1 tablet by mouth daily at 12 noon.   [DISCONTINUED] cefadroxil  (DURICEF) 500 MG capsule Take 1 capsule (500 mg total) by mouth 2 (two) times daily. (Patient not taking: Reported on 10/31/2024)   No facility-administered encounter medications on file as of 11/17/2024.    Allergies as of 11/17/2024 - Review Complete 11/17/2024  Allergen Reaction Noted   Other Shortness Of Breath and Other (See Comments) 05/03/2020    Past Medical History:  Diagnosis Date   ADHD (attention deficit hyperactivity  disorder)    Allergic rhinitis    Asthma    BMI 45.0-49.9, adult (HCC)    Borderline personality disorder (HCC)    Migraines    Obesity    Prediabetes    states today 11/11/20 was told in the past that she was pre- diabetic   Sleep apnea    Suicide attempt [facility])     Past Surgical History:  Procedure Laterality Date   ADENOIDECTOMY     TONSILLECTOMY     WISDOM TOOTH EXTRACTION Bilateral     Family History  Problem Relation Age of Onset   Bipolar  disorder Father    Schizophrenia Father    Liver disease Father    Asthma Father    SIDS Maternal Aunt    Asthma Maternal Grandmother    Schizophrenia Maternal Grandmother    Heart disease Paternal Grandmother    Diabetes Paternal Grandmother    Seizures Paternal Grandmother     Social History   Socioeconomic History   Marital status: Single    Spouse name: Not on file   Number of children: 0   Years of education: Not on file   Highest education level: 12th grade  Occupational History   Not on file  Tobacco Use   Smoking status: Former    Current packs/day: 0.50    Types: Cigarettes   Smokeless tobacco: Never   Tobacco comments:    vapes   Vaping Use   Vaping status: Former  Substance and Sexual Activity   Alcohol use: Not Currently   Drug use: Not Currently   Sexual activity: Yes    Birth control/protection: None  Other Topics Concern   Not on file  Social History Narrative   Lives with mom.    Social Drivers of Health   Tobacco Use: Medium Risk (11/17/2024)   Patient History    Smoking Tobacco Use: Former    Smokeless Tobacco Use: Never    Passive Exposure: Not on file  Financial Resource Strain: Medium Risk (08/09/2024)   Overall Financial Resource Strain (CARDIA)    Difficulty of Paying Living Expenses: Somewhat hard  Food Insecurity: No Food Insecurity (09/18/2024)   Received [facility]   Epic    Within the past 12 months, you worried that your food would run out before you got the money to buy more.: Never true    Within the past 12 months, the food you bought just didn't last and you didn't have money to get more.: Never true  Recent Concern: Food Insecurity - Food Insecurity Present (08/09/2024)   Epic    Worried About Programme Researcher, Broadcasting/film/video in the Last Year: Sometimes true    Ran Out of Food in the Last Year: Never true  Transportation Needs: No Transportation Needs (09/19/2024)   Received from [facility]   Epic    In the past 12 months,  has lack of transportation kept you from medical appointments or from getting medications?: No    In the past 12 months, has lack of transportation kept you from meetings, work, or from getting things needed for daily living?: No  Physical Activity: Insufficiently Active (08/09/2024)   Exercise Vital Sign    Days of Exercise per Week: 3 days    Minutes of Exercise per Session: 20 min  Stress: No Stress Concern Present (09/18/2024)   Received from [facility] of Occupational Health - Occupational Stress Questionnaire    Do you feel stress - tense,  restless, nervous, or anxious, or unable to sleep at night because your mind is troubled all the time - these days?: Only a little  Recent Concern: Stress - Stress Concern Present (08/09/2024)   [employer] of Occupational Health - Occupational Stress Questionnaire    Feeling of Stress: Rather much  Social Connections: Moderately Isolated (08/09/2024)   Social Connection and Isolation Panel    Frequency of Communication with Friends and Family: More than three times a week    Frequency of Social Gatherings with Friends and Family: More than three times a week    Attends Religious Services: 1 to 4 times per year    Active Member of [employer] or Organizations: No    Attends Engineer, Structural: Not on file    Marital Status: Never married  Intimate Partner Violence: Not At Risk (09/18/2024)   Received from [facility]   Epic    Within the last year, have you been afraid of your partner or ex-partner?: No    Within the last year, have you been humiliated or emotionally abused in other ways by your partner or ex-partner?: No    Within the last year, have you been kicked, hit, slapped, or otherwise physically hurt by your partner or ex-partner?: No    Within the last year, have you been raped or forced to have any kind of sexual activity by your partner or ex-partner?: No  Depression (PHQ2-9): Medium Risk (09/11/2024)    Depression (PHQ2-9)    PHQ-2 Score: 5  Alcohol Screen: Medium Risk (05/22/2024)   Alcohol Screen    Last Alcohol Screening Score (AUDIT): 9  Housing: Low Risk (09/18/2024)   Received from [facility]    In the last 12 months, was there a time when you were not able to pay the mortgage or rent on time?: No    In the past 12 months, how many times have you moved where you were living?: 1    At any time in the past 12 months, were you homeless or living in a shelter (including now)?: No  Utilities: Not At Risk (09/18/2024)   Received from [facility]    In the past 12 months has the electric, gas, oil, or water company threatened to shut off services in your home?: No  Health Literacy: Adequate Health Literacy (05/22/2024)   B1300 Health Literacy    Frequency of need for help with medical instructions: Rarely    Review of Systems  Respiratory:  Positive for shortness of breath.   Psychiatric/Behavioral:  Positive for sleep disturbance.     Vitals:   11/17/24 1033  BP: 129/84  Pulse: (!) 115  Temp: 98 F (36.7 C)  SpO2: 98%     Physical Exam Constitutional:      Appearance: She is obese.  HENT:     Head: Normocephalic.     Nose: Nose normal.     Mouth/Throat:     Mouth: Mucous membranes are moist.  Eyes:     General: No scleral icterus. Cardiovascular:     Rate and Rhythm: Normal rate and regular rhythm.     Heart sounds: No murmur heard.    No friction rub.  Pulmonary:     Effort: No respiratory distress.     Breath sounds: No stridor. No wheezing or rhonchi.  Abdominal:     Comments: Gravid  Musculoskeletal:     Cervical back: No rigidity or tenderness.  Neurological:  Mental Status: She is alert.  Psychiatric:        Mood and Affect: Mood normal.    Data Reviewed: She had a previous sleep study documented from previous, negative for for sleep disordered breathing-could not find the actual study  Assessment/Plan:  Asthma - Better  controlled - Continues with Symbicort  - Rarely needs albuterol  maybe once a week at most  Concern for sleep disordered breathing - This can be reevaluated once she has lost a significant amount of pregnancy weight - A sleep study can be considered if there is persistence of symptoms  Insomnia in pregnancy may be managed with doxylamine as needed  Encouraged to call with significant concerns  Tentative follow-up in about 3 months  Jennet Epley MD [facility] 11/17/2024, 10:39 AM  CC: Tad Arland POUR, CNM   "

## 2024-11-17 NOTE — Telephone Encounter (Signed)
 Preadmission screen

## 2024-11-19 LAB — CULTURE, BETA STREP (GROUP B ONLY): Strep Gp B Culture: NEGATIVE

## 2024-11-21 ENCOUNTER — Other Ambulatory Visit (HOSPITAL_BASED_OUTPATIENT_CLINIC_OR_DEPARTMENT_OTHER): Payer: Self-pay

## 2024-11-21 ENCOUNTER — Other Ambulatory Visit (HOSPITAL_BASED_OUTPATIENT_CLINIC_OR_DEPARTMENT_OTHER): Payer: MEDICAID

## 2024-11-28 ENCOUNTER — Ambulatory Visit (HOSPITAL_BASED_OUTPATIENT_CLINIC_OR_DEPARTMENT_OTHER): Payer: MEDICAID | Admitting: Obstetrics and Gynecology

## 2024-11-28 VITALS — BP 125/76 | HR 85 | Wt 391.6 lb

## 2024-11-28 DIAGNOSIS — Z3A38 38 weeks gestation of pregnancy: Secondary | ICD-10-CM

## 2024-11-28 DIAGNOSIS — E66813 Obesity, class 3: Secondary | ICD-10-CM | POA: Diagnosis not present

## 2024-11-28 DIAGNOSIS — O99213 Obesity complicating pregnancy, third trimester: Secondary | ICD-10-CM

## 2024-11-28 DIAGNOSIS — O09893 Supervision of other high risk pregnancies, third trimester: Secondary | ICD-10-CM | POA: Diagnosis not present

## 2024-11-28 DIAGNOSIS — O283 Abnormal ultrasonic finding on antenatal screening of mother: Secondary | ICD-10-CM | POA: Diagnosis not present

## 2024-11-28 DIAGNOSIS — O0990 Supervision of high risk pregnancy, unspecified, unspecified trimester: Secondary | ICD-10-CM

## 2024-11-28 NOTE — Progress Notes (Signed)
" ° °  PRENATAL VISIT NOTE  Subjective:  Whitney Beard is a 23 y.o. G1P0 at [redacted]w[redacted]d being seen today for ongoing prenatal care.  She is currently monitored for the following issues for this low-risk pregnancy and has OSA (obstructive sleep apnea); Class 3 severe obesity due to excess calories with serious comorbidity and body mass index (BMI) of 50.0 to 59.9 in adult [facility]); Major depressive disorder; Severe bipolar I disorder, current or most recent episode depressed (HCC); Anxiety disorder, unspecified; Cannabis use disorder, mild, abuse; Borderline personality disorder (HCC); Severe persistent allergic asthma (HCC); Supervision of high risk pregnancy, antepartum; Asthma during pregnancy; H/O suicide attempt; History of prediabetes; Asthma exacerbation; LGSIL on Pap smear of cervix; Family history of albinism; and Maternal morbid obesity in third trimester, antepartum (HCC) on their problem list.  Patient reports no complaints.  Contractions: Irregular. Vag. Bleeding: None.  Movement: Present. Denies leaking of fluid.   The following portions of the patient's history were reviewed and updated as appropriate: allergies, current medications, past family history, past medical history, past social history, past surgical history and problem list.   Objective:   Vitals:   11/28/24 1329  BP: 125/76  Pulse: 85  Weight: (!) 391 lb 9.6 oz (177.6 kg)    Fetal Status:  Fetal Heart Rate (bpm): 140s NST   Movement: Present Presentation: Vertex  General: Alert, oriented and cooperative. Patient is in no acute distress.  Skin: Skin is warm and dry. No rash noted.   Cardiovascular: Normal heart rate noted  Respiratory: Normal respiratory effort, no problems with respiration noted  Abdomen: Soft, gravid, appropriate for gestational age.  Pain/Pressure: Present     Pelvic: Cervical exam performed in the presence of a chaperone Dilation: Fingertip Effacement (%): Thick    Extremities: Normal range of motion.   Edema: Trace (feet)  Mental Status: Normal mood and affect. Normal behavior. Normal judgment and thought content.   Assessment and Plan:  Pregnancy: G1P0 at [redacted]w[redacted]d 1. Supervision of high risk pregnancy, antepartum (Primary) BP and FHR normal   2. Class 3 severe obesity due to excess calories with serious comorbidity and body mass index (BMI) of 50.0 to 59.9 in adult [facility])  Labor precautions  IOL 1/2, discussed process, medications, pain management NST reactive today  3. Abnormal finding ultrasound Fetal head cystic area, plan fetal ultrasound after delivery   4. [redacted] weeks gestation of pregnancy   Term labor symptoms and general obstetric precautions including but not limited to vaginal bleeding, contractions, leaking of fluid and fetal movement were reviewed in detail with the patient. Please refer to After Visit Summary for other counseling recommendations.   Return pp  Future Appointments  Date Time Provider Department Center  12/01/2024  6:45 AM MC-LD SCHED ROOM [facility]IN[facility] None  12/01/2024  1:15 PM [facility] PROVIDER 1 [facility]  12/01/2024  1:30 PM [facility] US2 [facility]  01/01/2025 11:00 AM [facility] HOME SLEEP STUDY [facility] [address]  03/05/2025  2:00 PM Neda Jennet LABOR, MD [facility] [address] Nidia Daring, FNP "

## 2024-11-30 NOTE — L&D Delivery Note (Addendum)
 OB/GYN Faculty Practice Delivery Note  Whitney Beard is a 24 y.o. G1P0 s/p vaginal delivery at [redacted]w[redacted]d. She was admitted for IOL for BMI 63, and developed GHTN while intrapartum (elevated BP and negative PreE labs).   ROM: 10h 49m with moderate meconium fluid GBS Status: Negative Maximum Maternal Temperature: 98.5  Labor Progress: Whitney Beard is a 24 y.o. yo G1P0 at [redacted]w[redacted]d was admitted on 12/01/2024 for IOL for BMI 63, complicated by  gHTN while intrapartum.   Delivery Date/Time: 12/02/24 at [phone] Delivery: CNM and SNM called to room and patient was complete and pushing. Head delivered direct OA. No nuchal cord present. Shoulder and body delivered in bed, in usual fashion. Infant with spontaneous cry, placed on mother's abdomen, dried and stimulated. Cord clamped x 2 after 1-minute delay, and cut by Grandmother. Cord blood drawn. Placenta delivered spontaneously, intact, with 3-vessel cord. Fundus firm with massage, Pitocin  and TXA. Labia, perineum, vagina, and cervix inspected, no laceration found.   Placenta: Intact, delivered spontaneously on 12/02/24 at 0842 Complications: None Lacerations: None EBL: 408  Analgesia: Epidural    Infant: Baby Girl  APGARs 8/9  Weight: 2850 g (6 lb 4.5 oz)  Whitney Beard, SNM  Center for [employer], [facility] 12/02/2024, 9:06 AM

## 2024-12-01 ENCOUNTER — Inpatient Hospital Stay (HOSPITAL_COMMUNITY): Payer: MEDICAID | Admitting: Anesthesiology

## 2024-12-01 ENCOUNTER — Inpatient Hospital Stay (HOSPITAL_COMMUNITY)
Admission: RE | Admit: 2024-12-01 | Discharge: 2024-12-04 | DRG: 806 | Disposition: A | Discharge status: Home / Self Care | Payer: MEDICAID | Attending: Family Medicine | Admitting: Family Medicine

## 2024-12-01 ENCOUNTER — Other Ambulatory Visit: Payer: Self-pay

## 2024-12-01 ENCOUNTER — Inpatient Hospital Stay (HOSPITAL_COMMUNITY): Payer: MEDICAID

## 2024-12-01 ENCOUNTER — Encounter (HOSPITAL_COMMUNITY): Payer: Self-pay | Admitting: Obstetrics & Gynecology

## 2024-12-01 ENCOUNTER — Ambulatory Visit: Payer: MEDICAID

## 2024-12-01 ENCOUNTER — Inpatient Hospital Stay (HOSPITAL_COMMUNITY): Admission: AD | Admit: 2024-12-01 | Payer: MEDICAID | Source: Home / Self Care

## 2024-12-01 ENCOUNTER — Encounter (HOSPITAL_COMMUNITY): Payer: Self-pay | Admitting: Anesthesiology

## 2024-12-01 DIAGNOSIS — J45909 Unspecified asthma, uncomplicated: Secondary | ICD-10-CM | POA: Diagnosis present

## 2024-12-01 DIAGNOSIS — Z79899 Other long term (current) drug therapy: Secondary | ICD-10-CM | POA: Diagnosis not present

## 2024-12-01 DIAGNOSIS — Z7951 Long term (current) use of inhaled steroids: Secondary | ICD-10-CM | POA: Diagnosis not present

## 2024-12-01 DIAGNOSIS — Z7982 Long term (current) use of aspirin: Secondary | ICD-10-CM | POA: Diagnosis not present

## 2024-12-01 DIAGNOSIS — F314 Bipolar disorder, current episode depressed, severe, without psychotic features: Secondary | ICD-10-CM | POA: Diagnosis present

## 2024-12-01 DIAGNOSIS — O99344 Other mental disorders complicating childbirth: Secondary | ICD-10-CM | POA: Diagnosis present

## 2024-12-01 DIAGNOSIS — Z3A39 39 weeks gestation of pregnancy: Secondary | ICD-10-CM

## 2024-12-01 DIAGNOSIS — Z30430 Encounter for insertion of intrauterine contraceptive device: Secondary | ICD-10-CM

## 2024-12-01 DIAGNOSIS — Z833 Family history of diabetes mellitus: Secondary | ICD-10-CM | POA: Diagnosis not present

## 2024-12-01 DIAGNOSIS — Z8249 Family history of ischemic heart disease and other diseases of the circulatory system: Secondary | ICD-10-CM

## 2024-12-01 DIAGNOSIS — J4550 Severe persistent asthma, uncomplicated: Secondary | ICD-10-CM | POA: Diagnosis present

## 2024-12-01 DIAGNOSIS — E66813 Obesity, class 3: Secondary | ICD-10-CM

## 2024-12-01 DIAGNOSIS — O99214 Obesity complicating childbirth: Principal | ICD-10-CM | POA: Diagnosis present

## 2024-12-01 DIAGNOSIS — O139 Gestational [pregnancy-induced] hypertension without significant proteinuria, unspecified trimester: Secondary | ICD-10-CM | POA: Diagnosis not present

## 2024-12-01 DIAGNOSIS — Z87891 Personal history of nicotine dependence: Secondary | ICD-10-CM | POA: Diagnosis not present

## 2024-12-01 DIAGNOSIS — Z6841 Body Mass Index (BMI) 40.0 and over, adult: Secondary | ICD-10-CM

## 2024-12-01 DIAGNOSIS — O0990 Supervision of high risk pregnancy, unspecified, unspecified trimester: Secondary | ICD-10-CM

## 2024-12-01 DIAGNOSIS — O9952 Diseases of the respiratory system complicating childbirth: Secondary | ICD-10-CM | POA: Diagnosis present

## 2024-12-01 DIAGNOSIS — O134 Gestational [pregnancy-induced] hypertension without significant proteinuria, complicating childbirth: Secondary | ICD-10-CM | POA: Diagnosis not present

## 2024-12-01 LAB — CBC
HCT: 30.2 % — ABNORMAL LOW (ref 36.0–46.0)
HCT: 30.8 % — ABNORMAL LOW (ref 36.0–46.0)
Hemoglobin: 10.5 g/dL — ABNORMAL LOW (ref 12.0–15.0)
Hemoglobin: 10.5 g/dL — ABNORMAL LOW (ref 12.0–15.0)
MCH: 31.8 pg (ref 26.0–34.0)
MCH: 31.3 pg (ref 26.0–34.0)
MCHC: 34.8 g/dL (ref 30.0–36.0)
MCHC: 34.1 g/dL (ref 30.0–36.0)
MCV: 91.5 fL (ref 80.0–100.0)
MCV: 91.9 fL (ref 80.0–100.0)
Platelets: 297 K/uL (ref 150–400)
Platelets: 303 K/uL (ref 150–400)
RBC: 3.3 MIL/uL — ABNORMAL LOW (ref 3.87–5.11)
RBC: 3.35 MIL/uL — ABNORMAL LOW (ref 3.87–5.11)
RDW: 14.5 % (ref 11.5–15.5)
RDW: 14.4 % (ref 11.5–15.5)
WBC: 7 K/uL (ref 4.0–10.5)
WBC: 9.4 K/uL (ref 4.0–10.5)
nRBC: 0 % (ref 0.0–0.2)
nRBC: 0 % (ref 0.0–0.2)

## 2024-12-01 LAB — COMPREHENSIVE METABOLIC PANEL WITH GFR
ALT: 8 U/L (ref 0–44)
AST: 12 U/L — ABNORMAL LOW (ref 15–41)
Albumin: 3.2 g/dL — ABNORMAL LOW (ref 3.5–5.0)
Alkaline Phosphatase: 122 U/L (ref 38–126)
Anion gap: 11 (ref 5–15)
BUN: 10 mg/dL (ref 6–20)
CO2: 24 mmol/L (ref 22–32)
Calcium: 8.7 mg/dL — ABNORMAL LOW (ref 8.9–10.3)
Chloride: 102 mmol/L (ref 98–111)
Creatinine, Ser: 0.67 mg/dL (ref 0.44–1.00)
GFR, Estimated: >60 mL/min
Glucose, Bld: 79 mg/dL (ref 70–99)
Potassium: 3.8 mmol/L (ref 3.5–5.1)
Sodium: 137 mmol/L (ref 135–145)
Total Bilirubin: 0.3 mg/dL (ref 0.0–1.2)
Total Protein: 6.3 g/dL — ABNORMAL LOW (ref 6.5–8.1)

## 2024-12-01 LAB — PROTEIN / CREATININE RATIO, URINE
Creatinine, Urine: 88 mg/dL
Protein Creatinine Ratio: 0.1 mg/mg
Total Protein, Urine: 8 mg/dL

## 2024-12-01 LAB — TYPE AND SCREEN
ABO/RH(D): O POS
Antibody Screen: NEGATIVE

## 2024-12-01 LAB — SYPHILIS: RPR W/REFLEX TO RPR TITER AND TREPONEMAL ANTIBODIES, TRADITIONAL SCREENING AND DIAGNOSIS ALGORITHM: RPR Ser Ql: NONREACTIVE

## 2024-12-01 MED ORDER — LACTATED RINGERS IV SOLN: INTRAVENOUS | Status: DC

## 2024-12-01 MED ORDER — FLUTICASONE FUROATE-VILANTEROL 100-25 MCG/ACT IN AEPB
1.0000 | INHALATION_SPRAY | Freq: Every day | RESPIRATORY_TRACT | Status: DC
  Administered 2024-12-03 – 2024-12-04 (×2): 1 via RESPIRATORY_TRACT
  Filled 2024-12-01: qty 28

## 2024-12-01 MED ORDER — TERBUTALINE SULFATE 1 MG/ML IJ SOLN: 0.2500 mg | Freq: Once | INTRAMUSCULAR | Status: DC | PRN

## 2024-12-01 MED ORDER — EPHEDRINE 5 MG/ML INJ: 10.0000 mg | INTRAVENOUS | Status: DC | PRN

## 2024-12-01 MED ORDER — OXYTOCIN-SODIUM CHLORIDE 30-0.9 UT/500ML-% IV SOLN
1.0000 m[IU]/min | INTRAVENOUS | Status: DC
  Administered 2024-12-01 – 2024-12-02 (×2): 2 m[IU]/min via INTRAVENOUS

## 2024-12-01 MED ORDER — LACTATED RINGERS IV SOLN
500.0000 mL | INTRAVENOUS | Status: DC | PRN
  Administered 2024-12-02: 500 mL via INTRAVENOUS

## 2024-12-01 MED ORDER — LIDOCAINE HCL (PF) 1 % IJ SOLN: 30.0000 mL | INTRAMUSCULAR | Status: DC | PRN

## 2024-12-01 MED ORDER — MISOPROSTOL 25 MCG QUARTER TABLET
25.0000 ug | ORAL_TABLET | Freq: Once | ORAL | Status: AC
  Administered 2024-12-01: 25 ug via VAGINAL
  Filled 2024-12-01: qty 1

## 2024-12-01 MED ORDER — ALBUTEROL SULFATE (2.5 MG/3ML) 0.083% IN NEBU: 2.5000 mg | INHALATION_SOLUTION | RESPIRATORY_TRACT | Status: DC | PRN

## 2024-12-01 MED ORDER — PHENYLEPHRINE 80 MCG/ML (10ML) SYRINGE FOR IV PUSH (FOR BLOOD PRESSURE SUPPORT)
80.0000 ug | PREFILLED_SYRINGE | INTRAVENOUS | Status: DC | PRN
  Filled 2024-12-01: qty 10

## 2024-12-01 MED ORDER — SOD CITRATE-CITRIC ACID 500-334 MG/5ML PO SOLN: 30.0000 mL | ORAL | Status: DC | PRN

## 2024-12-01 MED ORDER — OXYTOCIN-SODIUM CHLORIDE 30-0.9 UT/500ML-% IV SOLN
2.5000 [IU]/h | INTRAVENOUS | Status: DC
  Filled 2024-12-01: qty 500

## 2024-12-01 MED ORDER — ACETAMINOPHEN 325 MG PO TABS: 650.0000 mg | ORAL_TABLET | ORAL | Status: DC | PRN

## 2024-12-01 MED ORDER — MISOPROSTOL 50MCG HALF TABLET
50.0000 ug | ORAL_TABLET | Freq: Once | ORAL | Status: AC
  Administered 2024-12-01: 50 ug via ORAL
  Filled 2024-12-01: qty 1

## 2024-12-01 MED ORDER — FENTANYL CITRATE (PF) 100 MCG/2ML IJ SOLN
100.0000 ug | INTRAMUSCULAR | Status: DC | PRN
  Administered 2024-12-01 (×2): 100 ug via INTRAVENOUS
  Filled 2024-12-01 (×2): qty 2

## 2024-12-01 MED ORDER — LACTATED RINGERS IV SOLN
500.0000 mL | Freq: Once | INTRAVENOUS | Status: AC
  Administered 2024-12-02: 500 mL via INTRAVENOUS

## 2024-12-01 MED ORDER — FENTANYL-BUPIVACAINE-NACL 0.5-0.125-0.9 MG/250ML-% EP SOLN
12.0000 mL/h | EPIDURAL | Status: DC | PRN
  Administered 2024-12-01: 12 mL/h via EPIDURAL
  Filled 2024-12-01: qty 250

## 2024-12-01 MED ORDER — OXYTOCIN BOLUS FROM INFUSION
333.0000 mL | Freq: Once | INTRAVENOUS | Status: AC
  Administered 2024-12-02: 333 mL via INTRAVENOUS

## 2024-12-01 MED ORDER — ONDANSETRON HCL 4 MG/2ML IJ SOLN
4.0000 mg | Freq: Four times a day (QID) | INTRAMUSCULAR | Status: DC | PRN
  Administered 2024-12-01: 4 mg via INTRAVENOUS
  Filled 2024-12-01: qty 2

## 2024-12-01 MED ORDER — DIPHENHYDRAMINE HCL 50 MG/ML IJ SOLN: 12.5000 mg | INTRAMUSCULAR | Status: DC | PRN

## 2024-12-01 MED ORDER — LIDOCAINE-EPINEPHRINE (PF) 1.5 %-1:200000 IJ SOLN
INTRAMUSCULAR | Status: DC | PRN
  Administered 2024-12-01: 5 mL via EPIDURAL

## 2024-12-01 MED ORDER — PHENYLEPHRINE 80 MCG/ML (10ML) SYRINGE FOR IV PUSH (FOR BLOOD PRESSURE SUPPORT): 80.0000 ug | PREFILLED_SYRINGE | INTRAVENOUS | Status: DC | PRN

## 2024-12-01 NOTE — Anesthesia Procedure Notes (Signed)
 Epidural Patient location during procedure: OB Start time: 12/01/2024 9:10 PM End time: 12/01/2024 9:25 PM  Staffing Anesthesiologist: Dorethea Cordella SQUIBB, DO Performed: anesthesiologist   Preanesthetic Checklist Completed: patient identified, IV checked, site marked, risks and benefits discussed, surgical consent, monitors and equipment checked, pre-op evaluation and timeout performed  Epidural Patient position: sitting Prep: ChloraPrep Patient monitoring: heart rate, continuous pulse ox and blood pressure Approach: midline Location: L3-L4 Injection technique: LOR saline  Needle:  Needle insertion depth (cm): 13 Needle type: Tuohy  Needle gauge: 17 G Needle length: 15 cm Needle insertion depth: 13 cm Catheter type: closed end flexible Catheter size: 20 Guage Catheter at skin depth: 20 cm Test dose: negative and 1.5% lidocaine   Assessment Events: blood not aspirated, no cerebrospinal fluid, injection not painful, no injection resistance and no paresthesia  Additional Notes Patient identified. Risks/Benefits/Options discussed with patient including but not limited to bleeding, infection, nerve damage, paralysis, failed block, incomplete pain control, headache, blood pressure changes, nausea, vomiting, reactions to medications, itching and postpartum back pain. Confirmed with bedside nurse the patient's most recent platelet count. Confirmed with patient that they are not currently taking any anticoagulation, have any bleeding history or any family history of bleeding disorders. Patient expressed understanding and wished to proceed. All questions were answered. Sterile technique was used throughout the entire procedure. Please see nursing notes for vital signs. Test dose was given through epidural catheter and negative prior to continuing to dose epidural or start infusion. Warning signs of high block given to the patient including shortness of breath, tingling/numbness in hands,  complete motor block, or any concerning symptoms with instructions to call for help. Patient was given instructions on fall risk and not to get out of bed. All questions and concerns addressed with instructions to call with any issues or inadequate analgesia.    Reason for block:procedure for pain

## 2024-12-01 NOTE — Anesthesia Preprocedure Evaluation (Signed)
 "                                  Anesthesia Evaluation  Patient identified by MRN, date of birth, ID band Patient awake    Reviewed: Allergy & Precautions, NPO status , Patient's Chart, lab work & pertinent test results  Airway Mallampati: III       Dental no notable dental hx.    Pulmonary asthma , sleep apnea , former smoker   Pulmonary exam normal        Cardiovascular negative cardio ROS  Rhythm:Regular Rate:Normal     Neuro/Psych  Headaches  Anxiety Depression Bipolar Disorder      GI/Hepatic negative GI ROS, Neg liver ROS,,,  Endo/Other  negative endocrine ROS  Class 4 obesity  Renal/GU negative Renal ROS  negative genitourinary   Musculoskeletal negative musculoskeletal ROS (+)    Abdominal  (+) + obese  Peds  Hematology Lab Results      Component                Value               Date                      WBC                      9.4                 12/01/2024                HGB                      10.5 (L)            12/01/2024                HCT                      30.8 (L)            12/01/2024                MCV                      91.9                12/01/2024                PLT                      303                 12/01/2024              Anesthesia Other Findings   Reproductive/Obstetrics (+) Pregnancy                              Anesthesia Physical Anesthesia Plan  ASA: 3  Anesthesia Plan: Epidural   Post-op Pain Management:    Induction:   PONV Risk Score and Plan: 2 and Treatment may vary due to age or medical condition  Airway Management Planned: Natural Airway  Additional Equipment: None  Intra-op Plan:   Post-operative Plan:   Informed Consent: I have reviewed the patients History and Physical,  chart, labs and discussed the procedure including the risks, benefits and alternatives for the proposed anesthesia with the patient or authorized representative who has indicated  his/her understanding and acceptance.     Dental advisory given  Plan Discussed with: CRNA  Anesthesia Plan Comments:         Anesthesia Quick Evaluation  "

## 2024-12-01 NOTE — Progress Notes (Signed)
 Labor Progress Note Whitney Beard is a 24 y.o. G1P0 at [redacted]w[redacted]d presented for IOL for BMI.   S:  Comfortable s/p epidural.   O:  BP (!) 153/96   Pulse 90   Temp 97.7 F (36.5 C) (Axillary)   Resp 16   Ht 5' 6 (1.676 m)   Wt (!) 178.4 kg   LMP 02/25/2024 (Approximate)   SpO2 99%   BMI 63.46 kg/m   EFM: baseline 125 bpm/ moderate variability/ 15x15 accels/ early decels  Toco/IUPC: 5-6 SVE: Dilation: 5.5 Effacement (%): 50 Station: -3 Presentation: Vertex Exam by:: Whitney Beard, CNM Pitocin : 0 mu/min  A/P: 24 y.o. G1P0 [redacted]w[redacted]d  1. Labor: IOL in latent phase, RBA of  IUPC and AROM d/w patient, patient consents to both. AROM with moderate clear fluid noted, IUPC placed easily. Patient tolerated well. Start pitocin  and titrate for adequate MVUs.  2. FWB: Cat 1, reassuring overall.  3. Pain: Coping well, s/p epidural  4. GBS neg   Anticipate SVB.  Whitney DELENA Rote, CNM 10:36 PM

## 2024-12-01 NOTE — H&P (Signed)
 Whitney Beard is a 24 y.o. female G1P0 with IUP at [redacted]w[redacted]d presenting for IOL for BMI. [facility] at [facility] 6 wks  Prenatal History/Complications: N/A     Past Medical History: Past Medical History:  Diagnosis Date   ADHD (attention deficit hyperactivity disorder)    Allergic rhinitis    Asthma    BMI 45.0-49.9, adult (HCC)    Borderline personality disorder (HCC)    Depression    Migraines    Obesity    Prediabetes    states today 11/11/20 was told in the past that she was pre- diabetic   Sleep apnea    Suicide attempt [facility])     Past Surgical History: Past Surgical History:  Procedure Laterality Date   ADENOIDECTOMY     TONSILLECTOMY     WISDOM TOOTH EXTRACTION Bilateral     Obstetrical History: OB History     Gravida  1   Para      Term      Preterm      AB      Living         SAB      IAB      Ectopic      Multiple      Live Births              Social History: Social History   Socioeconomic History   Marital status: Single    Spouse name: Not on file   Number of children: 0   Years of education: Not on file   Highest education level: 12th grade  Occupational History   Not on file  Tobacco Use   Smoking status: Former    Current packs/day: 0.50    Types: Cigarettes   Smokeless tobacco: Never   Tobacco comments:    vapes   Vaping Use   Vaping status: Former  Substance and Sexual Activity   Alcohol use: Not Currently   Drug use: Not Currently   Sexual activity: Yes    Birth control/protection: None  Other Topics Concern   Not on file  Social History Narrative   Lives with mom.    Social Drivers of Health   Tobacco Use: Medium Risk (11/17/2024)   Patient History    Smoking Tobacco Use: Former    Smokeless Tobacco Use: Never    Passive Exposure: Not on file  Financial Resource Strain: Medium Risk (08/09/2024)   Overall Financial Resource Strain (CARDIA)    Difficulty of Paying Living Expenses: Somewhat hard  Food  Insecurity: No Food Insecurity (09/18/2024)   Received from [facility]   Epic    Within the past 12 months, you worried that your food would run out before you got the money to buy more.: Never true    Within the past 12 months, the food you bought just didn't last and you didn't have money to get more.: Never true  Recent Concern: Food Insecurity - Food Insecurity Present (08/09/2024)   Epic    Worried About Programme Researcher, Broadcasting/film/video in the Last Year: Sometimes true    Ran Out of Food in the Last Year: Never true  Transportation Needs: No Transportation Needs (09/19/2024)   Received from [facility]   Epic    In the past 12 months, has lack of transportation kept you from medical appointments or from getting medications?: No    In the past 12 months, has lack of transportation kept you from meetings, work, or from getting  things needed for daily living?: No  Physical Activity: Insufficiently Active (08/09/2024)   Exercise Vital Sign    Days of Exercise per Week: 3 days    Minutes of Exercise per Session: 20 min  Stress: No Stress Concern Present (09/18/2024)   Received from [facility] of Occupational Health - Occupational Stress Questionnaire    Do you feel stress - tense, restless, nervous, or anxious, or unable to sleep at night because your mind is troubled all the time - these days?: Only a little  Recent Concern: Stress - Stress Concern Present (08/09/2024)   [employer] of Occupational Health - Occupational Stress Questionnaire    Feeling of Stress: Rather much  Social Connections: Moderately Isolated (08/09/2024)   Social Connection and Isolation Panel    Frequency of Communication with Friends and Family: More than three times a week    Frequency of Social Gatherings with Friends and Family: More than three times a week    Attends Religious Services: 1 to 4 times per year    Active Member of [employer] or Organizations: No    Attends Banker  Meetings: Not on file    Marital Status: Never married  Depression (PHQ2-9): Medium Risk (09/11/2024)   Depression (PHQ2-9)    PHQ-2 Score: 5  Alcohol Screen: Medium Risk (05/22/2024)   Alcohol Screen    Last Alcohol Screening Score (AUDIT): 9  Housing: Low Risk (09/18/2024)   Received from [facility]    In the last 12 months, was there a time when you were not able to pay the mortgage or rent on time?: No    In the past 12 months, how many times have you moved where you were living?: 1    At any time in the past 12 months, were you homeless or living in a shelter (including now)?: No  Utilities: Not At Risk (09/18/2024)   Received from [facility]   Epic    In the past 12 months has the electric, gas, oil, or water company threatened to shut off services in your home?: No  Health Literacy: Adequate Health Literacy (05/22/2024)   B1300 Health Literacy    Frequency of need for help with medical instructions: Rarely    Family History: Family History  Problem Relation Age of Onset   Bipolar disorder Father    Schizophrenia Father    Liver disease Father    Asthma Father    SIDS Maternal Aunt    Asthma Maternal Grandmother    Schizophrenia Maternal Grandmother    Heart disease Paternal Grandmother    Diabetes Paternal Grandmother    Seizures Paternal Grandmother     Allergies: Allergies[1]  Medications Prior to Admission  Medication Sig Dispense Refill Last Dose/Taking   albuterol  (PROVENTIL ) (2.5 MG/3ML) 0.083% nebulizer solution Take 3 mLs (2.5 mg total) by nebulization every 4 (four) hours as needed for wheezing or shortness of breath. 75 mL 2    albuterol  (VENTOLIN  HFA) 108 (90 Base) MCG/ACT inhaler Inhale 2 puffs into the lungs every 4 (four) hours as needed for wheezing or shortness of breath. 18 g 1    aluminum -magnesium  hydroxide-simethicone  (MAALOX) 200-200-20 MG/5ML SUSP Take 30 mLs by mouth 3 (three) times daily as needed (upper abdominal pain/acid  reflux). 1680 mL 2    aspirin  EC 81 MG tablet Take 1 tablet (81 mg total) by mouth daily. Swallow whole.      Blood Pressure Monitoring (BLOOD PRESSURE KIT)  DEVI Check BP daily 1 each 0    budesonide -formoterol  (SYMBICORT ) 80-4.5 MCG/ACT inhaler Inhale 2 puffs into the lungs.      busPIRone  (BUSPAR ) 10 MG tablet Take 1 tablet (10 mg total) by mouth 3 (three) times daily as needed. 30 tablet 0    cyclobenzaprine  (FLEXERIL ) 10 MG tablet Take 1 tablet (10 mg total) by mouth every 8 (eight) hours as needed for muscle spasms. 30 tablet 0    famotidine  (PEPCID ) 20 MG tablet Take 1 tablet (20 mg total) by mouth 2 (two) times daily. 30 tablet 0    NIFEdipine  (PROCARDIA -XL/NIFEDICAL-XL) 30 MG 24 hr tablet Take 1 tablet (30 mg total) by mouth daily. 90 tablet 0    nystatin  cream (MYCOSTATIN ) Apply thin layer twice a day for one week 30 g 0    ondansetron  (ZOFRAN ) 4 MG tablet Take 1 tablet (4 mg total) by mouth every 8 (eight) hours as needed for nausea or vomiting. 30 tablet 3    ondansetron  (ZOFRAN -ODT) 4 MG disintegrating tablet Take 1 tablet (4 mg total) by mouth every 8 (eight) hours as needed for nausea or vomiting. 15 tablet 0    polyethylene glycol powder (MIRALAX ) 17 GM/SCOOP powder Dissolve 1 capful (17g) in a large glass of water and take by mouth twice a day for 3-5 days until you are having regular bowel movements, then decrease to once daily after that for maintenance of daily bowel movements 238 g 2    prenatal vitamin w/FE, FA (PRENATAL 1 + 1) 27-1 MG TABS tablet Take 1 tablet by mouth daily at 12 noon. 30 tablet 12         Review of Systems   Constitutional: Negative for fever and chills Eyes: Negative for visual disturbances Respiratory: Negative for shortness of breath, dyspnea Cardiovascular: Negative for chest pain or palpitations  Gastrointestinal: Negative for abdominal pain, vomiting, diarrhea and constipation.   Genitourinary: Negative for dysuria and urgency Musculoskeletal:  Negative for back pain, joint pain, myalgias  Neurological: Negative for dizziness and headaches      Last menstrual period 02/25/2024. General appearance: alert, cooperative, and appears stated age Lungs: normal respiratory effort Heart: regular rate and rhythm Abdomen: soft, non-tender; bowel sounds normal Extremities: Homans sign is negative, no sign of DVT DTR's 2+ Presentation: cephalic Fetal monitoring  Baseline: 135 bpm, Variability: Good {> 6 bpm), Accelerations: Reactive, and Decelerations: Absent Uterine activity  None      Prenatal labs: ABO, Rh: O/Positive/-- (06/23 1544) Antibody: Negative (06/23 1544) Rubella: immune RPR: Non Reactive (06/23 1544)  HBsAg: Negative (06/23 1544)  HIV: Non Reactive (06/23 1544)  GBS: Negative/-- (12/16 1432)   NURSING  PROVIDER  Office Location [facility] Dating by U/S at 6 wks  [facility] Model Traditional Anatomy U/S Possible cystic area in fetal head-recommend postnatal US   Initiated care at  [facility]  English              LAB RESULTS   Support Person Carmelita Queen [place] FOB is involved Genetics NIPS: Low-Risk Female AFP: not done    NT/IT (FT only)     Carrier Screen Horizon: Neg 4/4  Rhogam  O/Positive/-- (06/23 1544) A1C/GTT Early HgbA1C: Normal Third trimester 2 hr GTT: Normal  Flu Vaccine 08/09/2024    TDaP Vaccine 08/09/2024 Blood Type O/Positive/-- (06/23 1544)  RSV Vaccine She does want this Antibody Negative (06/23 1544)  COVID Vaccine  declines Rubella 2.05 (06/23 1544)  Feeding Plan breast RPR Non Reactive (06/23 1544)  Contraception IUD HBsAg Negative (06/23 1544)  Circumcision N/a HIV Non Reactive (06/23 1544)  Pediatrician  Considering [facility] HCVAb  Neg  Prenatal Classes Info given    BTL Consent  Pap LGSIL -- repeat PP  BTL Pre-payment  GC/CT Initial:  Neg/Neg 36wks:    VBAC Consent  GBS   For PCN allergy, check sensitivities   BRx Optimized? [ ]  yes   [ ]  no    DME Rx  [x ] BP cuff [ ]  Weight Scale Waterbirth  N/A [ ]  Class [ ]  Consent [ ]  CNM visit  PHQ9 & GAD7 [ x ] new OB [ X ] 28 weeks  [  ] 36 weeks Induction  [ ]  Orders Entered [ ] Foley Y/N     Prenatal Transfer Tool  Maternal Diabetes: No Genetic Screening: Normal Maternal Ultrasounds/Referrals: Other: possible cystic area in fetal head, recommend postnatal US  Fetal Ultrasounds or other Referrals:  Referred to Materal Fetal Medicine  Maternal Substance Abuse:  No Significant Maternal Medications:  None Significant Maternal Lab Results: Group B Strep negative Number of Prenatal Visits:greater than 3 verified prenatal visits Maternal Vaccinations:TDap and Flu Other Comments:  None    No results found for this or any previous visit (from the past 24 hours).  Assessment: Whitney Beard is a 24 y.o. G1P0 with an IUP at [redacted]w[redacted]d presenting for IOL for severe obesity per MFM.  Plan: #Labor: IOL process explained in detail. Will start with dual cytotec. #Pain:  Per request #FWB Cat 1 #Asthma: Continue Symbicort  inpatient and albuterol  PRN.   Barkley Angles, MD OB Fellow, Faculty Practice [facility], Center for [facility]        [1]  Allergies Allergen Reactions   Other Shortness Of Breath and Other (See Comments)    Animals such as cats and dogs and pollen - sneezing, asthma trigger

## 2024-12-02 ENCOUNTER — Encounter (HOSPITAL_COMMUNITY): Payer: Self-pay | Admitting: Obstetrics & Gynecology

## 2024-12-02 DIAGNOSIS — O134 Gestational [pregnancy-induced] hypertension without significant proteinuria, complicating childbirth: Secondary | ICD-10-CM

## 2024-12-02 DIAGNOSIS — O99344 Other mental disorders complicating childbirth: Secondary | ICD-10-CM

## 2024-12-02 DIAGNOSIS — O99214 Obesity complicating childbirth: Secondary | ICD-10-CM

## 2024-12-02 DIAGNOSIS — O139 Gestational [pregnancy-induced] hypertension without significant proteinuria, unspecified trimester: Secondary | ICD-10-CM | POA: Diagnosis not present

## 2024-12-02 DIAGNOSIS — Z3A39 39 weeks gestation of pregnancy: Secondary | ICD-10-CM

## 2024-12-02 DIAGNOSIS — Z30430 Encounter for insertion of intrauterine contraceptive device: Secondary | ICD-10-CM

## 2024-12-02 MED ORDER — ACETAMINOPHEN 325 MG PO TABS
650.0000 mg | ORAL_TABLET | ORAL | Status: DC | PRN
  Administered 2024-12-02: 650 mg via ORAL
  Filled 2024-12-02: qty 2

## 2024-12-02 MED ORDER — FUROSEMIDE 20 MG PO TABS
20.0000 mg | ORAL_TABLET | Freq: Every day | ORAL | Status: DC
  Administered 2024-12-02 – 2024-12-04 (×3): 20 mg via ORAL
  Filled 2024-12-02 (×3): qty 1

## 2024-12-02 MED ORDER — ZOLPIDEM TARTRATE 5 MG PO TABS: 5.0000 mg | ORAL_TABLET | Freq: Every evening | ORAL | Status: DC | PRN

## 2024-12-02 MED ORDER — OXYTOCIN-SODIUM CHLORIDE 30-0.9 UT/500ML-% IV SOLN: 1.0000 m[IU]/min | INTRAVENOUS | Status: DC

## 2024-12-02 MED ORDER — WITCH HAZEL-GLYCERIN EX PADS: 1.0000 | MEDICATED_PAD | CUTANEOUS | Status: DC | PRN

## 2024-12-02 MED ORDER — POTASSIUM CHLORIDE CRYS ER 20 MEQ PO TBCR
20.0000 meq | EXTENDED_RELEASE_TABLET | Freq: Every day | ORAL | Status: DC
  Administered 2024-12-02 – 2024-12-04 (×3): 20 meq via ORAL
  Filled 2024-12-02 (×3): qty 1

## 2024-12-02 MED ORDER — OXYCODONE HCL 5 MG PO TABS: 5.0000 mg | ORAL_TABLET | ORAL | Status: DC | PRN

## 2024-12-02 MED ORDER — COCONUT OIL OIL: 1.0000 | TOPICAL_OIL | Status: DC | PRN

## 2024-12-02 MED ORDER — DIBUCAINE (PERIANAL) 1 % EX OINT: 1.0000 | TOPICAL_OINTMENT | CUTANEOUS | Status: DC | PRN

## 2024-12-02 MED ORDER — TRANEXAMIC ACID-NACL 1000-0.7 MG/100ML-% IV SOLN
1000.0000 mg | INTRAVENOUS | Status: AC
  Administered 2024-12-02: 1000 mg via INTRAVENOUS

## 2024-12-02 MED ORDER — SENNOSIDES-DOCUSATE SODIUM 8.6-50 MG PO TABS
2.0000 | ORAL_TABLET | ORAL | Status: DC
  Administered 2024-12-02 – 2024-12-04 (×3): 2 via ORAL
  Filled 2024-12-02 (×3): qty 2

## 2024-12-02 MED ORDER — BENZOCAINE-MENTHOL 20-0.5 % EX AERO: 1.0000 | INHALATION_SPRAY | CUTANEOUS | Status: DC | PRN

## 2024-12-02 MED ORDER — DIPHENHYDRAMINE HCL 25 MG PO CAPS: 25.0000 mg | ORAL_CAPSULE | Freq: Four times a day (QID) | ORAL | Status: DC | PRN

## 2024-12-02 MED ORDER — LEVONORGESTREL 20 MCG/DAY IU IUD
1.0000 | INTRAUTERINE_SYSTEM | Freq: Once | INTRAUTERINE | Status: AC
  Administered 2024-12-02: 1 via INTRAUTERINE
  Filled 2024-12-02: qty 1

## 2024-12-02 MED ORDER — SIMETHICONE 80 MG PO CHEW: 80.0000 mg | CHEWABLE_TABLET | ORAL | Status: DC | PRN

## 2024-12-02 MED ORDER — PRENATAL MULTIVITAMIN CH
1.0000 | ORAL_TABLET | Freq: Every day | ORAL | Status: DC
  Administered 2024-12-02 – 2024-12-04 (×3): 1 via ORAL
  Filled 2024-12-02 (×3): qty 1

## 2024-12-02 MED ORDER — TRANEXAMIC ACID-NACL 1000-0.7 MG/100ML-% IV SOLN
INTRAVENOUS | Status: AC
  Filled 2024-12-02: qty 100

## 2024-12-02 MED ORDER — MEASLES, MUMPS & RUBELLA VAC ~~LOC~~ SUSR: 0.5000 mL | Freq: Once | SUBCUTANEOUS | Status: DC

## 2024-12-02 MED ORDER — ONDANSETRON HCL 4 MG PO TABS: 4.0000 mg | ORAL_TABLET | ORAL | Status: DC | PRN

## 2024-12-02 MED ORDER — ONDANSETRON HCL 4 MG/2ML IJ SOLN: 4.0000 mg | INTRAMUSCULAR | Status: DC | PRN

## 2024-12-02 MED ORDER — LACTATED RINGERS AMNIOINFUSION: INTRAVENOUS | Status: DC

## 2024-12-02 MED ORDER — TETANUS-DIPHTH-ACELL PERTUSSIS 5-2-15.5 LF-MCG/0.5 IM SUSP: 0.5000 mL | Freq: Once | INTRAMUSCULAR | Status: DC

## 2024-12-02 MED ORDER — IBUPROFEN 600 MG PO TABS
600.0000 mg | ORAL_TABLET | Freq: Four times a day (QID) | ORAL | Status: DC
  Administered 2024-12-02 – 2024-12-04 (×9): 600 mg via ORAL
  Filled 2024-12-02 (×9): qty 1

## 2024-12-02 NOTE — Plan of Care (Signed)
   Problem: Education: Goal: Knowledge of General Education information will improve Description: Including pain rating scale, medication(s)/side effects and non-pharmacologic comfort measures Outcome: Progressing   Problem: Health Behavior/Discharge Planning: Goal: Ability to manage health-related needs will improve Outcome: Progressing   Problem: Clinical Measurements: Goal: Ability to maintain clinical measurements within normal limits will improve Outcome: Progressing Goal: Will remain free from infection Outcome: Progressing Goal: Diagnostic test results will improve Outcome: Progressing Goal: Respiratory complications will improve Outcome: Progressing Goal: Cardiovascular complication will be avoided Outcome: Progressing   Problem: Activity: Goal: Risk for activity intolerance will decrease Outcome: Progressing   Problem: Nutrition: Goal: Adequate nutrition will be maintained Outcome: Progressing   Problem: Coping: Goal: Level of anxiety will decrease Outcome: Progressing   Problem: Elimination: Goal: Will not experience complications related to bowel motility Outcome: Progressing Goal: Will not experience complications related to urinary retention Outcome: Progressing   Problem: Pain Managment: Goal: General experience of comfort will improve and/or be controlled Outcome: Progressing   Problem: Safety: Goal: Ability to remain free from injury will improve Outcome: Progressing   Problem: Skin Integrity: Goal: Risk for impaired skin integrity will decrease Outcome: Progressing   Problem: Education: Goal: Knowledge of Childbirth will improve Outcome: Progressing Goal: Ability to make informed decisions regarding treatment and plan of care will improve Outcome: Progressing Goal: Ability to state and carry out methods to decrease the pain will improve Outcome: Progressing Goal: Individualized Educational Video(s) Outcome: Progressing   Problem:  Coping: Goal: Ability to verbalize concerns and feelings about labor and delivery will improve Outcome: Progressing   Problem: Life Cycle: Goal: Ability to make normal progression through stages of labor will improve Outcome: Progressing Goal: Ability to effectively push during vaginal delivery will improve Outcome: Progressing   Problem: Role Relationship: Goal: Will demonstrate positive interactions with the child Outcome: Progressing   Problem: Safety: Goal: Risk of complications during labor and delivery will decrease Outcome: Progressing   Problem: Pain Management: Goal: Relief or control of pain from uterine contractions will improve Outcome: Progressing   Problem: Education: Goal: Knowledge of condition will improve Outcome: Progressing Goal: Individualized Educational Video(s) Outcome: Progressing Goal: Individualized Newborn Educational Video(s) Outcome: Progressing   Problem: Activity: Goal: Will verbalize the importance of balancing activity with adequate rest periods Outcome: Progressing Goal: Ability to tolerate increased activity will improve Outcome: Progressing   Problem: Coping: Goal: Ability to identify and utilize available resources and services will improve Outcome: Progressing   Problem: Life Cycle: Goal: Chance of risk for complications during the postpartum period will decrease Outcome: Progressing   Problem: Role Relationship: Goal: Ability to demonstrate positive interaction with newborn will improve Outcome: Progressing   Problem: Skin Integrity: Goal: Demonstration of wound healing without infection will improve Outcome: Progressing

## 2024-12-02 NOTE — Progress Notes (Signed)
 To bedside to discuss PP IUD. Patient desires Mirena  IUD immediately PP.  Subsequently assisted with repositioning for FHR decelerations. RBA of FSE d/w patient, patient consents, placed easily.   Pitocin  break for 30 minutes to 1 hour. Restart at 2. Consider amnioinfusion PRN.   Camie Rote, MSN, CNM, RNC-OB Certified Nurse Midwife, [facility] 12/02/2024 12:26 AM

## 2024-12-02 NOTE — Discharge Summary (Signed)
 "    Postpartum Discharge Summary  Date of Service updated***     Patient Name: Whitney Beard DOB: 12-25-2000 MRN: [id number]  Date of admission: 12/01/2024 Delivery date:12/02/2024 Delivering provider: LORELI IHA D Date of discharge: 12/02/2024  Admitting diagnosis: Morbid obesity (HCC) [E66.01] Intrauterine pregnancy: [redacted]w[redacted]d     Secondary diagnosis:  Principal Problem:   Morbid obesity (HCC) Active Problems:   Severe bipolar I disorder, current or most recent episode depressed (HCC)   Severe persistent allergic asthma (HCC)   Gestational hypertension  Additional problems: none    Discharge diagnosis: Term Pregnancy Delivered and Gestational Hypertension                                              Post partum procedures:postplacental IUD placement Augmentation: AROM, Pitocin , Cytotec , and IP Foley Complications: None  Hospital course: Induction of Labor With Vaginal Delivery   24 y.o. yo G1P1001 at [redacted]w[redacted]d was admitted to the hospital 12/01/2024 for induction of labor.  Indication for induction: BMI 63.  Patient had an labor course complicated by new dx of gHTN while intrapartum (mild range BPs with neg pre-e labs). Membrane Rupture Time/Date: 10:07 PM,12/01/2024  Delivery Method:Vaginal, Spontaneous Operative Delivery:N/A Episiotomy: None Lacerations:  None Details of delivery can be found in separate delivery note.  Patient had a postpartum course remarkable for 5-day course of Lasix  & K+; she did/did not *** require an antihypertensive. Patient is discharged home 12/02/2024.  Newborn Data: Birth date:12/02/2024 Birth time:8:33 AM Gender:Female Living status:Living Apgars:8 ,9  Weight:2850 g (6lb 4.5oz)  Magnesium  Sulfate received: No BMZ received: No Rhophylac:N/A MMR:N/A T-DaP:Given prenatally Flu: Yes RSV Vaccine received: No Transfusion:No  Immunizations received: Immunization History  Administered Date(s) Administered   DTaP 09/02/2000, 10/13/2000, 12/08/2000,  09/20/2001, 03/10/2005   HIB (PRP-T) 10/13/2000, 12/08/2000, 06/25/2001   Hepatitis A, Ped/Adol-2 Dose 06/29/2006, 10/14/2007   Hepatitis B, PED/ADOLESCENT 08-10-00, 07/21/2000, 02/15/2001   IPV 05/20/2000, 10/13/2000, 06/22/2001, 03/10/2005   Influenza, Seasonal, Injecte, Preservative Fre 08/09/2024   Influenza,inj,Quad PF,6+ Mos 08/27/2016, 11/13/2019, 11/12/2020   Influenza-Unspecified 11/13/2019   MMR 06/22/2001, 03/10/2005   Meningococcal Conjugate 10/09/2015   Pneumococcal Conjugate-13 08/26/2000, 10/13/2000, 12/08/2000, 10/30/2002   Pneumococcal Polysaccharide-23 11/12/2020   Tdap 07/13/2011, 08/09/2024   Varicella 06/22/2001, 06/29/2006    Physical exam  Vitals:   12/02/24 0916 12/02/24 0931 12/02/24 0946 12/02/24 1039  BP: (!) 113/55 122/61 138/72 123/70  Pulse: (!) 103 94 94 96  Resp:    18  Temp:    98.1 F (36.7 C)  TempSrc:    Oral  SpO2:    98%  Weight:      Height:       General: {Exam; general:21111117} Lochia: {Desc; appropriate/inappropriate:30686::appropriate} Uterine Fundus: {Desc; firm/soft:30687} Incision: {Exam; incision:21111123} DVT Evaluation: {Exam; icu:7888877} Labs: Lab Results  Component Value Date   WBC 9.4 12/01/2024   HGB 10.5 (L) 12/01/2024   HCT 30.8 (L) 12/01/2024   MCV 91.9 12/01/2024   PLT 303 12/01/2024      Latest Ref Rng & Units 12/01/2024    8:43 PM  CMP  Glucose 70 - 99 mg/dL 79   BUN 6 - 20 mg/dL 10   Creatinine 9.55 - 1.00 mg/dL 9.32   Sodium 864 - 854 mmol/L 137   Potassium 3.5 - 5.1 mmol/L 3.8   Chloride 98 - 111 mmol/L 102   CO2  22 - 32 mmol/L 24   Calcium  8.9 - 10.3 mg/dL 8.7   Total Protein 6.5 - 8.1 g/dL 6.3   Total Bilirubin 0.0 - 1.2 mg/dL 0.3   Alkaline Phos 38 - 126 U/L 122   AST 15 - 41 U/L 12   ALT 0 - 44 U/L 8    Edinburgh Score:     No data to display         No data recorded  After visit meds:  Allergies as of 12/02/2024       Reactions   Other Shortness Of Breath, Other (See  Comments)   Animals such as cats and dogs and pollen - sneezing, asthma trigger      Med Rec must be completed prior to using this [facility]***        Discharge home in stable condition Infant Feeding: {Baby feeding:23562} Infant Disposition:{CHL IP OB HOME WITH FNUYZM:76418} Discharge instruction: per After Visit Summary and Postpartum booklet. Activity: Advance as tolerated. Pelvic rest for 6 weeks.  Diet: routine diet Future Appointments: Future Appointments  Date Time Provider Department Center  01/01/2025 11:00 AM [facility] HOME SLEEP STUDY [facility] [id number]  03/05/2025  2:00 PM Neda Jennet LABOR, MD [facility] [id number]   Follow up Visit:  Loreli Suzen BIRCH, CNM  P [facility] Admin Please schedule this patient for Postpartum visit in: 6 weeks with the following provider: Nidia Showman For C/S patients schedule nurse incision check in weeks 2 weeks: no High risk pregnancy complicated by: morbid obesity; new gHTN Delivery mode:  SVD Anticipated Birth Control:  PP IUD Placed PP Procedures needed: RN BP check in 1wk; Pap at [facility] visit & IUD string trim (also get u/s for IUD placement prior to visit- dx: difficult placement & morbid obesity) Schedule Integrated BH visit: no   12/02/2024 Suzen BIRCH Loreli, CNM    "

## 2024-12-02 NOTE — Procedures (Signed)
 POST-PLACENTAL IUD INSERTION PROCEDURE NOTE Patient name: Whitney Beard MRN [id number]  Date of birth: 2001-03-01  The risks and benefits of the method and placement have been thouroughly reviewed with the patient and all questions were answered.  Specifically the patient is aware of failure rate of 11/998, expulsion of the IUD and of possible perforation.  The patient is aware of irregular bleeding due to the method and understands the incidence of irregular bleeding diminishes with time.   Signed copy of informed consent in chart.   Vaginal, labial and perineal areas thoroughly inspected for lacerations. Lacerations: none laceration identified and if needed was repaired prior to IUD insertion  Pt's IUD choice: Mirena   Time out was performed.    IUD removed from insertion device and grasped between sterile gloved fingers. Fundus challenging to identify through abdominal wall using non-insertion hand due to body habitus. IUD inserted to fundus with bimanual technique, as best could be assessed. IUD carefully released at the fundus and insertion hand gently removed from vagina.    Strings just below the level of the introitus. Patient tolerated procedure well.  Patient given post procedure instructions and IUD care card with expiration date.  Patient is asked to keep IUD strings tucked in her vagina until her postpartum follow up visit in 4-6 weeks. Patient advised to abstain from sexual intercourse and pulling on strings before her follow-up visit. Patient verbalized an understanding of the plan of care and agrees.   Charges entered.  Note sent to [facility] to order a pelvic u/s to determine IUD placement just before PP visit since placement was difficult.  Suzen JONETTA Gentry CNM 12/02/2024 9:17 AM

## 2024-12-02 NOTE — Lactation Note (Signed)
 This note was copied from a baby's chart. Lactation Consultation Note  Patient Name: Whitney Beard Unijb'd Date: 12/02/2024 Age:25 hours Reason for consult: Initial assessment;1st time breastfeeding;Breastfeeding assistance;Mother's request  P1, Request to assist with breastfeeding. Mother has attempted latching but baby has not latched since birth. Reviewed hand expression with drops expressed. Assisted with latching in football hold on the left breast. Baby sustained latch for 11 min. Attempted latching on the right breast but baby did not wake to feed. Provided mother with manual pump fitted with 18 mm flange. Feed on demand with cues.  Goal 8-12+ times per day after first 24 hrs.  Place baby STS if not cueing.  Left baby skin to skin on mother's chest.  Mother has had breastfeeding education at [facility] and plans to go after discharge to [facility] for breastfeeding support.   Maternal Data Has patient been taught Hand Expression?: Yes Does the patient have breastfeeding experience prior to this delivery?: No  Feeding Mother's Current Feeding Choice: Breast Milk  LATCH Score Latch: Repeated attempts needed to sustain latch, nipple held in mouth throughout feeding, stimulation needed to elicit sucking reflex.  Audible Swallowing: A few with stimulation  Type of Nipple: Everted at rest and after stimulation  Comfort (Breast/Nipple): Soft / non-tender  Hold (Positioning): Assistance needed to correctly position infant at breast and maintain latch.  LATCH Score: 7   Lactation Tools Discussed/Used Tools: Pump;Flanges Flange Size: 18 Breast pump type: Manual Pump Education: Setup, frequency, and cleaning;Milk Storage Reason for Pumping: stimulation Pumping frequency: PRN  Interventions Interventions: Breast feeding basics reviewed;Assisted with latch  Discharge Pump: Manual (States her insurance pump is on it's way) WIC Program: Yes  Consult Status Consult Status:  Follow-up Date: 12/03/24 Follow-up type: In-patient    Shannon Dines [facility] 12/02/2024, 2:48 PM

## 2024-12-02 NOTE — Anesthesia Postprocedure Evaluation (Signed)
"   Anesthesia Post Note  Patient: Whitney Beard  Procedure(s) Performed: AN AD HOC LABOR EPIDURAL     Patient location during evaluation: Mother Baby Anesthesia Type: Epidural Level of consciousness: awake and alert Pain management: pain level controlled Vital Signs Assessment: post-procedure vital signs reviewed and stable Respiratory status: spontaneous breathing, nonlabored ventilation and respiratory function stable Cardiovascular status: stable Postop Assessment: no headache, no backache and epidural receding Anesthetic complications: no   No notable events documented.  Last Vitals:  Vitals:   12/02/24 1039 12/02/24 1145  BP: 123/70 125/74  Pulse: 96 96  Resp: 18 18  Temp: 36.7 C 36.9 C  SpO2: 98% 100%    Last Pain:  Vitals:   12/02/24 1145  TempSrc: Oral  PainSc:    Pain Goal:                   Caresse Sedivy      "

## 2024-12-02 NOTE — Progress Notes (Addendum)
 At this time the MD and RN analyzed the strip together. Dr. Fredirick states that labor will continue to labor and pitocin  will remain at 1 due to moderate variability, spontaneous accelerations, and HR is maintaining above 90.

## 2024-12-03 LAB — BIRTH TISSUE RECOVERY COLLECTION (PLACENTA DONATION)

## 2024-12-03 MED ORDER — NIFEDIPINE ER OSMOTIC RELEASE 30 MG PO TB24
30.0000 mg | ORAL_TABLET | Freq: Every day | ORAL | Status: DC
  Administered 2024-12-03 – 2024-12-04 (×2): 30 mg via ORAL
  Filled 2024-12-03 (×2): qty 1

## 2024-12-03 NOTE — Lactation Note (Signed)
 This note was copied from a baby's chart. Lactation Consultation Note  Patient Name: Whitney Beard Unijb'd Date: 12/03/2024 Age:25 hours Reason for consult: Follow-up assessment;Primapara  P1. Mom told LC that she is just going to formula feed. That is what mom prefers. LC discussed w/mom how to dry her milk up. And manage engorgement if occurs. Maternal Data    Feeding Mother's Current Feeding Choice: Formula Nipple Type: Slow - flow  LATCH Score                    Lactation Tools Discussed/Used    Interventions    Discharge    Consult Status Consult Status: Complete    Nirvi Boehler G 12/03/2024, 9:23 PM

## 2024-12-03 NOTE — Clinical Social Work Maternal (Signed)
 CLINICAL SOCIAL WORK MATERNAL/CHILD NOTE  Patient Details  Name: Whitney Beard MRN: [id number] Date of Birth: 01/07/2001  Date:  2025/03/07  Clinical Social Worker Initiating Note:  Sharyne Roulette, LCSWA Date/Time: Initiated:  12/03/24/1126     Child's Name:  Whitney Beard   Biological Parents:  Mother, Father (Whitney Beard)   Need for Interpreter:      Reason for Referral:  Behavioral Health Concerns   Address:  [address] [place] [address]    Phone number:  [phone] (home)     Additional phone number:   Household Members/Support Persons (HM/SP):   Household Member/Support Person 1, Household Member/Support Person 2, Household Member/Support Person 3, Household Member/Support Person 4, Household Member/Support Person 5, Household Member/Support Person 6, Household Member/Support Person 7, Household Member/Support Person 8   HM/SP Name Relationship DOB or Age  HM/SP -1 Whitney Beard Mother    HM/SP -2   Step-dad    HM/SP -3 Grandmother      HM/SP -4   Brother 16  HM/SP -5   Sister 23  HM/SP -6   Sister 14  HM/SP -7   Niece 2  HM/SP -8   Niece 4 months    Natural Supports (not living in the home):  Friends, Psychologist, Forensic other   Professional Supports: Other (Comment) (ACT Team)   Employment: Environmental Education Officer   Type of Work: [employer]   Education:  High school graduate   Homebound arranged:    Financial Resources:  Medicaid   Other Resources:  [facility]   Cultural/Religious Considerations Which May Impact Care:  Per [employer], she identifies as Curator  Strengths:  Ability to meet basic needs  , Home prepared for child  , Understanding of illness, Pediatrician chosen   Psychotropic Medications:         Pediatrician:    [employer] area  Pediatrician List:   [facility]  [place]    [place]    [place] [facility]      Pediatrician Fax  Number:    Risk Factors/Current Problems:  Mental Health Concerns     Cognitive State:  Insightful  , Able to Concentrate  , Goal Oriented  , Alert  , Linear Thinking     Mood/Affect:  Comfortable  , Interested  , Calm  , Euthymic  , Bright     CSW Assessment: CSW was consulted due to history of Bipolar Disorder, anxiety, depression, and history of suicide attempts. CSW met with MOB at bedside to complete assessment. When CSW entered room, MOB was observed sitting in hospital bed holding infant Whitney Beard. MOB was observed soothing Whitney Beard and began feeding her a bottle. CSW introduced self and explained reason for consult. MOB presented as calm, was agreeable to consult and remained engaged throughout encounter.   MOB confirmed demographic information listed in chart. MOB reports she lives with her family (see chart above) who is supportive of her and infant. MOB states FOB is involved but they have a rocky relationship.  CSW assessed current mood and inquired about mental health history. MOB reports feeling pretty good, sharing she is feeling a little overwhelmed. MOB states that she has not gotten much sleep since delivery and that her mom will be at the hospital soon so that she can take a nap. CSW commended MOB for utilizing her supports and prioritizing self care. MOB acknowledged a mental health history of depression  and anxiety (diagnosed age 69), Bipolar Disorder Beard (diagnosed age 43), Borderline Personality Disorder (diagnosed age 2). MOB states she has not been presribed mental health medication during her pregnancy but plans to restart medication postpartum. MOB states she is followed by a PA and therapist at [facility]) who she met with 11/15/25 to discuss restarting psychotropic medication postpartum. MOB states she plans to call [facility] in the coming week to schedule a psychiatry and therapy appointment. MOB states she is also followed by  [facility] ACT team [facility]) who meets with her weekly. MOB expressed motivation to care for her mental health postpartum so that she can be the best mother possible. CSW positively affirmed MOB for setting up a safety net of support prior to delivery. CSW inquired about a history of suicide attempts. MOB was forthcoming, stating she has been hospitalization inpatient psychiatrically about 14 times, largely during her teenage years. MOB states she was last hospitalized inpatient for suicidal ideation in 2023 and her most recent suicide attempt was in 2019. CSW inquired about MOB's mental health during pregnancy. MOB states she felt better than expected and feels that her ACT team has been beneficial. MOB reports her last manic episode was about 1 year ago and her last depressive episode was about 7-8 months ago. Per chart review, MOB presented to the ED 08/2024 due to passive SI. MOB exhibited good insight into her mental health symptoms and diagnoses and stated that she is asks for help when she begins endorsing SI. MOB denies SI since 08/2024. CSW inquired about additional coping skills. MOB identified talking with her best friend who she referred to as her rock, and doing her hair and makeup as healthy coping skills. CSW assessed for safety. MOB denied current SI/HI/domestic violence. MOB did not display any acute mental health signs/symptoms during consult.   CSW provided education regarding the baby blues period vs. perinatal mood disorders, discussed treatment and gave resources for mental health follow up if concerns arise.  CSW reviewed risk factors/signs/symptoms of postpartum psychosis given diagnoses of Bipolar Disorder and encouraged MOB to prioritize sleep during the postpartum period. CSW recommends self-evaluation during the postpartum time period using the New Mom Checklist from Postpartum Progress and encouraged MOB to contact a medical professional if symptoms are noted at  any time.    MOB reports she has a car seat, clothing, diapers and wipes but is in need of a crib. CSW offered to provide a pack n play, MOB expressed appreciation. CSW brought pack n play to room. MOB states the only thing she still needs is an electric breast pump. MOB states she has spoken with lactation and they provided her with a manual pump. CSW encouraged MOB to follow up with lactation and [facility] regarding breast pump needs. MOB has chosen [employer] for infant's follow up care. CSW inquired about food insecurity noted in chart. MOB states she receives [facility] benefits and her mom is in the process of helping her apply for Food Stamps. MOB was accepting of additional food bank resources, which CSW provided. MOB also provided MOB with information about [employer].   CSW provided review of Sudden Infant Death Syndrome (SIDS) precautions.    CSW identifies no further need for intervention and no barriers to discharge at this time.   CSW Plan/Description:  No Further Intervention Required/No Barriers to Discharge, Sudden Infant Death Syndrome (SIDS) Education, Perinatal Mood and Anxiety Disorder (PMADs) Education, Other Information/Referral to [employer]  Resources    Hubert Derstine K [place], LCSWA 12/03/2024, 11:35 AM

## 2024-12-03 NOTE — Progress Notes (Signed)
 POSTPARTUM PROGRESS NOTE  Subjective: Whitney Beard is a 24 y.o. G1P1001 s/p SVB at [redacted]w[redacted]d.  She reports she doing well. No acute events overnight. She denies any problems with ambulating, voiding or po intake. Denies nausea or vomiting. She has not passed flatus. Pain is well controlled.  Lochia is appropriate.  Objective: Blood pressure (!) 135/57, pulse 78, temperature 98 F (36.7 C), temperature source Oral, resp. rate 18, height 5' 6 (1.676 m), weight (!) 178.4 kg, last menstrual period 02/25/2024, SpO2 100%, unknown if currently breastfeeding.  Physical Exam:  General: alert, cooperative and no distress Chest: no respiratory distress Abdomen: soft, non-tender  Uterine Fundus: firm, appropriately tender Extremities: No calf swelling or tenderness  trace edema  Recent Labs    12/01/24 0745 12/01/24 2043  HGB 10.5* 10.5*  HCT 30.2* 30.8*    Assessment/Plan: Whitney Beard is a 24 y.o. G1P1001 s/p SVB at [redacted]w[redacted]d  Routine Postpartum Care: Doing well, pain well-controlled.  -- Continue routine care, lactation support  -- Contraception: IUD placed post-placenta -- Feeding: Breast and bottle. Having trouble with latching. Recommend LC consultation.   Dispo: Plan for discharge 12/04/2024.  Camie Rote, MSN, CNM, RNC-OB Certified Nurse Midwife, [facility] 12/03/2024 7:24 AM

## 2024-12-04 ENCOUNTER — Encounter (HOSPITAL_BASED_OUTPATIENT_CLINIC_OR_DEPARTMENT_OTHER): Payer: MEDICAID | Admitting: Obstetrics & Gynecology

## 2024-12-04 ENCOUNTER — Other Ambulatory Visit (HOSPITAL_COMMUNITY): Payer: Self-pay

## 2024-12-04 MED ORDER — BENZOCAINE-MENTHOL 20-0.5 % EX AERO: 1.0000 | INHALATION_SPRAY | CUTANEOUS | Status: AC | PRN

## 2024-12-04 MED ORDER — COCONUT OIL OIL: 1.0000 | TOPICAL_OIL | Status: AC | PRN

## 2024-12-04 MED ORDER — DIBUCAINE (PERIANAL) 1 % EX OINT: 1.0000 | TOPICAL_OINTMENT | CUTANEOUS | Status: AC | PRN

## 2024-12-04 MED ORDER — POLYETHYLENE GLYCOL 3350 17 GM/SCOOP PO POWD
ORAL | 2 refills | Status: AC
  Filled 2024-12-04: qty 238, 9d supply, fill #0

## 2024-12-04 MED ORDER — WITCH HAZEL-GLYCERIN EX PADS: 1.0000 | MEDICATED_PAD | CUTANEOUS | Status: AC | PRN

## 2024-12-04 MED ORDER — FUROSEMIDE 20 MG PO TABS
20.0000 mg | ORAL_TABLET | Freq: Every day | ORAL | 0 refills | Status: DC
  Filled 2024-12-04: qty 2, 2d supply, fill #0

## 2024-12-04 MED ORDER — IBUPROFEN 600 MG PO TABS
600.0000 mg | ORAL_TABLET | Freq: Four times a day (QID) | ORAL | 0 refills | Status: AC
  Filled 2024-12-04: qty 30, 8d supply, fill #0

## 2024-12-04 MED ORDER — POTASSIUM CHLORIDE CRYS ER 20 MEQ PO TBCR
20.0000 meq | EXTENDED_RELEASE_TABLET | Freq: Every day | ORAL | 0 refills | Status: DC
  Filled 2024-12-04: qty 2, 2d supply, fill #0

## 2024-12-04 MED ORDER — ACETAMINOPHEN 325 MG PO TABS
650.0000 mg | ORAL_TABLET | ORAL | 0 refills | Status: AC | PRN
  Filled 2024-12-04: qty 30, 3d supply, fill #0

## 2024-12-04 NOTE — Discharge Instructions (Signed)
 WHAT TO LOOK OUT FOR: Fever of 100.4 or above Mastitis: feels like flu and breasts hurt Infection: increased pain, swelling or redness Blood clots golf ball size or larger Postpartum depression Pre Eclampsia Weight gain more than 5lbs in a week Blurry vision or seeing spots Increased swelling  Stomach pain under right breast   Congratulations on your newest addition!

## 2024-12-04 NOTE — Plan of Care (Signed)
 " Problem: Education: Goal: Knowledge of General Education information will improve Description: Including pain rating scale, medication(s)/side effects and non-pharmacologic comfort measures 12/04/2024 1157 by Madison Rosina LABOR, LPN Outcome: Progressing 12/04/2024 0728 by Madison Rosina LABOR, LPN Outcome: Progressing   Problem: Health Behavior/Discharge Planning: Goal: Ability to manage health-related needs will improve 12/04/2024 1157 by Madison Rosina LABOR, LPN Outcome: Progressing 12/04/2024 0728 by Madison Rosina LABOR, LPN Outcome: Progressing   Problem: Clinical Measurements: Goal: Ability to maintain clinical measurements within normal limits will improve 12/04/2024 1157 by Madison Rosina LABOR, LPN Outcome: Progressing 12/04/2024 0728 by Madison Rosina LABOR, LPN Outcome: Progressing Goal: Will remain free from infection 12/04/2024 1157 by Madison Rosina LABOR, LPN Outcome: Progressing 12/04/2024 0728 by Madison Rosina LABOR, LPN Outcome: Progressing Goal: Diagnostic test results will improve 12/04/2024 1157 by Madison Rosina LABOR, LPN Outcome: Progressing 12/04/2024 0728 by Madison Rosina LABOR, LPN Outcome: Progressing Goal: Respiratory complications will improve 12/04/2024 1157 by Madison Rosina LABOR, LPN Outcome: Progressing 12/04/2024 0728 by Madison Rosina LABOR, LPN Outcome: Progressing Goal: Cardiovascular complication will be avoided 12/04/2024 1157 by Madison Rosina LABOR, LPN Outcome: Progressing 12/04/2024 0728 by Madison Rosina LABOR, LPN Outcome: Progressing   Problem: Activity: Goal: Risk for activity intolerance will decrease 12/04/2024 1157 by Madison Rosina LABOR, LPN Outcome: Progressing 12/04/2024 0728 by Madison Rosina LABOR, LPN Outcome: Progressing   Problem: Nutrition: Goal: Adequate nutrition will be maintained 12/04/2024 1157 by Madison Rosina LABOR, LPN Outcome: Progressing 12/04/2024 0728 by Madison Rosina LABOR, LPN Outcome: Progressing   Problem: Coping: Goal: Level of anxiety will decrease 12/04/2024 1157 by Madison Rosina LABOR, LPN Outcome:  Progressing 12/04/2024 0728 by Madison Rosina LABOR, LPN Outcome: Progressing   Problem: Elimination: Goal: Will not experience complications related to bowel motility 12/04/2024 1157 by Madison Rosina LABOR, LPN Outcome: Progressing 12/04/2024 0728 by Madison Rosina LABOR, LPN Outcome: Progressing Goal: Will not experience complications related to urinary retention 12/04/2024 1157 by Madison Rosina LABOR, LPN Outcome: Progressing 12/04/2024 0728 by Madison Rosina LABOR, LPN Outcome: Progressing   Problem: Pain Managment: Goal: General experience of comfort will improve and/or be controlled 12/04/2024 1157 by Madison Rosina LABOR, LPN Outcome: Progressing 12/04/2024 0728 by Madison Rosina LABOR, LPN Outcome: Progressing   Problem: Safety: Goal: Ability to remain free from injury will improve 12/04/2024 1157 by Madison Rosina LABOR, LPN Outcome: Progressing 12/04/2024 0728 by Madison Rosina LABOR, LPN Outcome: Progressing   Problem: Skin Integrity: Goal: Risk for impaired skin integrity will decrease 12/04/2024 1157 by Madison Rosina LABOR, LPN Outcome: Progressing 12/04/2024 0728 by Madison Rosina LABOR, LPN Outcome: Progressing   Problem: Education: Goal: Knowledge of Childbirth will improve 12/04/2024 1157 by Madison Rosina LABOR, LPN Outcome: Progressing 12/04/2024 0728 by Madison Rosina LABOR, LPN Outcome: Progressing Goal: Ability to make informed decisions regarding treatment and plan of care will improve 12/04/2024 1157 by Madison Rosina LABOR, LPN Outcome: Progressing 12/04/2024 0728 by Madison Rosina LABOR, LPN Outcome: Progressing Goal: Ability to state and carry out methods to decrease the pain will improve 12/04/2024 1157 by Madison Rosina LABOR, LPN Outcome: Progressing 12/04/2024 0728 by Madison Rosina LABOR, LPN Outcome: Progressing Goal: Individualized Educational Video(s) 12/04/2024 1157 by Madison Rosina LABOR, LPN Outcome: Progressing 12/04/2024 0728 by Madison Rosina LABOR, LPN Outcome: Progressing   Problem: Coping: Goal: Ability to verbalize concerns and feelings about labor and  delivery will improve 12/04/2024 1157 by Madison Rosina LABOR, LPN Outcome: Progressing 12/04/2024 0728 by Madison Rosina LABOR, LPN Outcome: Progressing   Problem: Life Cycle: Goal: Ability to make normal  progression through stages of labor will improve 12/04/2024 1157 by Madison Rosina LABOR, LPN Outcome: Progressing 12/04/2024 0728 by Madison Rosina LABOR, LPN Outcome: Progressing Goal: Ability to effectively push during vaginal delivery will improve 12/04/2024 1157 by Madison Rosina LABOR, LPN Outcome: Progressing 12/04/2024 0728 by Madison Rosina LABOR, LPN Outcome: Progressing   Problem: Role Relationship: Goal: Will demonstrate positive interactions with the child 12/04/2024 1157 by Madison Rosina LABOR, LPN Outcome: Progressing 12/04/2024 0728 by Madison Rosina LABOR, LPN Outcome: Progressing   Problem: Safety: Goal: Risk of complications during labor and delivery will decrease 12/04/2024 1157 by Madison Rosina LABOR, LPN Outcome: Progressing 12/04/2024 0728 by Madison Rosina LABOR, LPN Outcome: Progressing   Problem: Pain Management: Goal: Relief or control of pain from uterine contractions will improve 12/04/2024 1157 by Madison Rosina LABOR, LPN Outcome: Progressing 12/04/2024 0728 by Madison Rosina LABOR, LPN Outcome: Progressing   Problem: Education: Goal: Knowledge of condition will improve 12/04/2024 1157 by Madison Rosina LABOR, LPN Outcome: Progressing 12/04/2024 0728 by Madison Rosina LABOR, LPN Outcome: Progressing Goal: Individualized Educational Video(s) 12/04/2024 1157 by Madison Rosina LABOR, LPN Outcome: Progressing 12/04/2024 0728 by Madison Rosina LABOR, LPN Outcome: Progressing Goal: Individualized Newborn Educational Video(s) 12/04/2024 1157 by Madison Rosina LABOR, LPN Outcome: Progressing 12/04/2024 0728 by Madison Rosina LABOR, LPN Outcome: Progressing   Problem: Activity: Goal: Will verbalize the importance of balancing activity with adequate rest periods 12/04/2024 1157 by Madison Rosina LABOR, LPN Outcome: Progressing 12/04/2024 0728 by Madison Rosina LABOR, LPN Outcome:  Progressing Goal: Ability to tolerate increased activity will improve 12/04/2024 1157 by Madison Rosina LABOR, LPN Outcome: Progressing 12/04/2024 0728 by Madison Rosina LABOR, LPN Outcome: Progressing   Problem: Coping: Goal: Ability to identify and utilize available resources and services will improve 12/04/2024 1157 by Madison Rosina LABOR, LPN Outcome: Progressing 12/04/2024 0728 by Madison Rosina LABOR, LPN Outcome: Progressing   Problem: Life Cycle: Goal: Chance of risk for complications during the postpartum period will decrease 12/04/2024 1157 by Madison Rosina LABOR, LPN Outcome: Progressing 12/04/2024 0728 by Madison Rosina LABOR, LPN Outcome: Progressing   Problem: Role Relationship: Goal: Ability to demonstrate positive interaction with newborn will improve 12/04/2024 1157 by Madison Rosina LABOR, LPN Outcome: Progressing 12/04/2024 0728 by Madison Rosina LABOR, LPN Outcome: Progressing   Problem: Skin Integrity: Goal: Demonstration of wound healing without infection will improve 12/04/2024 1157 by Madison Rosina LABOR, LPN Outcome: Progressing 12/04/2024 0728 by Madison Rosina LABOR, LPN Outcome: Progressing   Problem: Education: Goal: Knowledge of General Education information will improve Description: Including pain rating scale, medication(s)/side effects and non-pharmacologic comfort measures 12/04/2024 1157 by Madison Rosina LABOR, LPN Outcome: Adequate for Discharge 12/04/2024 1157 by Madison Rosina LABOR, LPN Outcome: Progressing 12/04/2024 0728 by Madison Rosina LABOR, LPN Outcome: Progressing   Problem: Health Behavior/Discharge Planning: Goal: Ability to manage health-related needs will improve 12/04/2024 1157 by Madison Rosina LABOR, LPN Outcome: Adequate for Discharge 12/04/2024 1157 by Madison Rosina LABOR, LPN Outcome: Progressing 12/04/2024 0728 by Madison Rosina LABOR, LPN Outcome: Progressing   Problem: Clinical Measurements: Goal: Ability to maintain clinical measurements within normal limits will improve 12/04/2024 1157 by Madison Rosina LABOR, LPN Outcome:  Adequate for Discharge 12/04/2024 1157 by Madison Rosina LABOR, LPN Outcome: Progressing 12/04/2024 0728 by Madison Rosina LABOR, LPN Outcome: Progressing Goal: Will remain free from infection 12/04/2024 1157 by Madison Rosina LABOR, LPN Outcome: Adequate for Discharge 12/04/2024 1157 by Madison Rosina LABOR, LPN Outcome: Progressing 12/04/2024 0728 by Madison Rosina LABOR, LPN Outcome: Progressing Goal: Diagnostic test results will improve  12/04/2024 1157 by Madison Rosina LABOR, LPN Outcome: Adequate for Discharge 12/04/2024 1157 by Madison Rosina LABOR, LPN Outcome: Progressing 12/04/2024 0728 by Madison Rosina LABOR, LPN Outcome: Progressing Goal: Respiratory complications will improve 12/04/2024 1157 by Madison Rosina LABOR, LPN Outcome: Adequate for Discharge 12/04/2024 1157 by Madison Rosina LABOR, LPN Outcome: Progressing 12/04/2024 0728 by Madison Rosina LABOR, LPN Outcome: Progressing Goal: Cardiovascular complication will be avoided 12/04/2024 1157 by Madison Rosina LABOR, LPN Outcome: Adequate for Discharge 12/04/2024 1157 by Madison Rosina LABOR, LPN Outcome: Progressing 12/04/2024 0728 by Madison Rosina LABOR, LPN Outcome: Progressing   Problem: Activity: Goal: Risk for activity intolerance will decrease 12/04/2024 1157 by Madison Rosina LABOR, LPN Outcome: Adequate for Discharge 12/04/2024 1157 by Madison Rosina LABOR, LPN Outcome: Progressing 12/04/2024 0728 by Madison Rosina LABOR, LPN Outcome: Progressing   Problem: Nutrition: Goal: Adequate nutrition will be maintained 12/04/2024 1157 by Madison Rosina LABOR, LPN Outcome: Adequate for Discharge 12/04/2024 1157 by Madison Rosina LABOR, LPN Outcome: Progressing 12/04/2024 0728 by Madison Rosina LABOR, LPN Outcome: Progressing   Problem: Coping: Goal: Level of anxiety will decrease 12/04/2024 1157 by Madison Rosina LABOR, LPN Outcome: Adequate for Discharge 12/04/2024 1157 by Madison Rosina LABOR, LPN Outcome: Progressing 12/04/2024 0728 by Madison Rosina LABOR, LPN Outcome: Progressing   Problem: Elimination: Goal: Will not experience complications related to bowel  motility 12/04/2024 1157 by Madison Rosina LABOR, LPN Outcome: Adequate for Discharge 12/04/2024 1157 by Madison Rosina LABOR, LPN Outcome: Progressing 12/04/2024 0728 by Madison Rosina LABOR, LPN Outcome: Progressing Goal: Will not experience complications related to urinary retention 12/04/2024 1157 by Madison Rosina LABOR, LPN Outcome: Adequate for Discharge 12/04/2024 1157 by Madison Rosina LABOR, LPN Outcome: Progressing 12/04/2024 0728 by Madison Rosina LABOR, LPN Outcome: Progressing   Problem: Pain Managment: Goal: General experience of comfort will improve and/or be controlled 12/04/2024 1157 by Madison Rosina LABOR, LPN Outcome: Adequate for Discharge 12/04/2024 1157 by Madison Rosina LABOR, LPN Outcome: Progressing 12/04/2024 0728 by Madison Rosina LABOR, LPN Outcome: Progressing   Problem: Safety: Goal: Ability to remain free from injury will improve 12/04/2024 1157 by Madison Rosina LABOR, LPN Outcome: Adequate for Discharge 12/04/2024 1157 by Madison Rosina LABOR, LPN Outcome: Progressing 12/04/2024 0728 by Madison Rosina LABOR, LPN Outcome: Progressing   Problem: Skin Integrity: Goal: Risk for impaired skin integrity will decrease 12/04/2024 1157 by Madison Rosina LABOR, LPN Outcome: Adequate for Discharge 12/04/2024 1157 by Madison Rosina LABOR, LPN Outcome: Progressing 12/04/2024 0728 by Madison Rosina LABOR, LPN Outcome: Progressing   Problem: Education: Goal: Knowledge of Childbirth will improve 12/04/2024 1157 by Madison Rosina LABOR, LPN Outcome: Adequate for Discharge 12/04/2024 1157 by Madison Rosina LABOR, LPN Outcome: Progressing 12/04/2024 0728 by Madison Rosina LABOR, LPN Outcome: Progressing Goal: Ability to make informed decisions regarding treatment and plan of care will improve 12/04/2024 1157 by Madison Rosina LABOR, LPN Outcome: Adequate for Discharge 12/04/2024 1157 by Madison Rosina LABOR, LPN Outcome: Progressing 12/04/2024 0728 by Madison Rosina LABOR, LPN Outcome: Progressing Goal: Ability to state and carry out methods to decrease the pain will improve 12/04/2024 1157 by Madison Rosina LABOR,  LPN Outcome: Adequate for Discharge 12/04/2024 1157 by Madison Rosina LABOR, LPN Outcome: Progressing 12/04/2024 0728 by Madison Rosina LABOR, LPN Outcome: Progressing Goal: Individualized Educational Video(s) 12/04/2024 1157 by Madison Rosina LABOR, LPN Outcome: Adequate for Discharge 12/04/2024 1157 by Madison Rosina LABOR, LPN Outcome: Progressing 12/04/2024 0728 by Madison Rosina LABOR, LPN Outcome: Progressing   Problem: Coping: Goal: Ability to verbalize concerns and feelings about labor and delivery will  improve 12/04/2024 1157 by Madison Rosina LABOR, LPN Outcome: Adequate for Discharge 12/04/2024 1157 by Madison Rosina LABOR, LPN Outcome: Progressing 12/04/2024 0728 by Madison Rosina LABOR, LPN Outcome: Progressing   Problem: Life Cycle: Goal: Ability to make normal progression through stages of labor will improve 12/04/2024 1157 by Madison Rosina LABOR, LPN Outcome: Adequate for Discharge 12/04/2024 1157 by Madison Rosina LABOR, LPN Outcome: Progressing 12/04/2024 0728 by Madison Rosina LABOR, LPN Outcome: Progressing Goal: Ability to effectively push during vaginal delivery will improve 12/04/2024 1157 by Madison Rosina LABOR, LPN Outcome: Adequate for Discharge 12/04/2024 1157 by Madison Rosina LABOR, LPN Outcome: Progressing 12/04/2024 0728 by Madison Rosina LABOR, LPN Outcome: Progressing   Problem: Role Relationship: Goal: Will demonstrate positive interactions with the child 12/04/2024 1157 by Madison Rosina LABOR, LPN Outcome: Adequate for Discharge 12/04/2024 1157 by Madison Rosina LABOR, LPN Outcome: Progressing 12/04/2024 0728 by Madison Rosina LABOR, LPN Outcome: Progressing   Problem: Safety: Goal: Risk of complications during labor and delivery will decrease 12/04/2024 1157 by Madison Rosina LABOR, LPN Outcome: Adequate for Discharge 12/04/2024 1157 by Madison Rosina LABOR, LPN Outcome: Progressing 12/04/2024 0728 by Madison Rosina LABOR, LPN Outcome: Progressing   Problem: Pain Management: Goal: Relief or control of pain from uterine contractions will improve 12/04/2024 1157 by Madison Rosina LABOR,  LPN Outcome: Adequate for Discharge 12/04/2024 1157 by Madison Rosina LABOR, LPN Outcome: Progressing 12/04/2024 0728 by Madison Rosina LABOR, LPN Outcome: Progressing   Problem: Education: Goal: Knowledge of condition will improve 12/04/2024 1157 by Madison Rosina LABOR, LPN Outcome: Adequate for Discharge 12/04/2024 1157 by Madison Rosina LABOR, LPN Outcome: Progressing 12/04/2024 0728 by Madison Rosina LABOR, LPN Outcome: Progressing Goal: Individualized Educational Video(s) 12/04/2024 1157 by Madison Rosina LABOR, LPN Outcome: Adequate for Discharge 12/04/2024 1157 by Madison Rosina LABOR, LPN Outcome: Progressing 12/04/2024 0728 by Madison Rosina LABOR, LPN Outcome: Progressing Goal: Individualized Newborn Educational Video(s) 12/04/2024 1157 by Madison Rosina LABOR, LPN Outcome: Adequate for Discharge 12/04/2024 1157 by Madison Rosina LABOR, LPN Outcome: Progressing 12/04/2024 0728 by Madison Rosina LABOR, LPN Outcome: Progressing   Problem: Activity: Goal: Will verbalize the importance of balancing activity with adequate rest periods 12/04/2024 1157 by Madison Rosina LABOR, LPN Outcome: Adequate for Discharge 12/04/2024 1157 by Madison Rosina LABOR, LPN Outcome: Progressing 12/04/2024 0728 by Madison Rosina LABOR, LPN Outcome: Progressing Goal: Ability to tolerate increased activity will improve 12/04/2024 1157 by Madison Rosina LABOR, LPN Outcome: Adequate for Discharge 12/04/2024 1157 by Madison Rosina LABOR, LPN Outcome: Progressing 12/04/2024 0728 by Madison Rosina LABOR, LPN Outcome: Progressing   Problem: Coping: Goal: Ability to identify and utilize available resources and services will improve 12/04/2024 1157 by Madison Rosina LABOR, LPN Outcome: Adequate for Discharge 12/04/2024 1157 by Madison Rosina LABOR, LPN Outcome: Progressing 12/04/2024 0728 by Madison Rosina LABOR, LPN Outcome: Progressing   Problem: Life Cycle: Goal: Chance of risk for complications during the postpartum period will decrease 12/04/2024 1157 by Madison Rosina LABOR, LPN Outcome: Adequate for Discharge 12/04/2024 1157 by Madison Rosina LABOR, LPN Outcome: Progressing 12/04/2024 0728 by Madison Rosina LABOR, LPN Outcome: Progressing   Problem: Role Relationship: Goal: Ability to demonstrate positive interaction with newborn will improve 12/04/2024 1157 by Madison Rosina LABOR, LPN Outcome: Adequate for Discharge 12/04/2024 1157 by Madison Rosina LABOR, LPN Outcome: Progressing 12/04/2024 0728 by Madison Rosina LABOR, LPN Outcome: Progressing   Problem: Skin Integrity: Goal: Demonstration of wound healing without infection will improve 12/04/2024 1157 by Madison Rosina LABOR, LPN Outcome: Adequate for Discharge 12/04/2024 1157 by Madison Rosina  A, LPN Outcome: Progressing 12/04/2024 0728 by Madison Rosina LABOR, LPN Outcome: Progressing   "

## 2024-12-04 NOTE — Plan of Care (Signed)
   Problem: Education: Goal: Knowledge of General Education information will improve Description: Including pain rating scale, medication(s)/side effects and non-pharmacologic comfort measures Outcome: Progressing   Problem: Health Behavior/Discharge Planning: Goal: Ability to manage health-related needs will improve Outcome: Progressing   Problem: Clinical Measurements: Goal: Ability to maintain clinical measurements within normal limits will improve Outcome: Progressing Goal: Will remain free from infection Outcome: Progressing Goal: Diagnostic test results will improve Outcome: Progressing Goal: Respiratory complications will improve Outcome: Progressing Goal: Cardiovascular complication will be avoided Outcome: Progressing   Problem: Activity: Goal: Risk for activity intolerance will decrease Outcome: Progressing   Problem: Nutrition: Goal: Adequate nutrition will be maintained Outcome: Progressing   Problem: Coping: Goal: Level of anxiety will decrease Outcome: Progressing   Problem: Elimination: Goal: Will not experience complications related to bowel motility Outcome: Progressing Goal: Will not experience complications related to urinary retention Outcome: Progressing   Problem: Pain Managment: Goal: General experience of comfort will improve and/or be controlled Outcome: Progressing   Problem: Safety: Goal: Ability to remain free from injury will improve Outcome: Progressing   Problem: Skin Integrity: Goal: Risk for impaired skin integrity will decrease Outcome: Progressing   Problem: Education: Goal: Knowledge of Childbirth will improve Outcome: Progressing Goal: Ability to make informed decisions regarding treatment and plan of care will improve Outcome: Progressing Goal: Ability to state and carry out methods to decrease the pain will improve Outcome: Progressing Goal: Individualized Educational Video(s) Outcome: Progressing   Problem:  Coping: Goal: Ability to verbalize concerns and feelings about labor and delivery will improve Outcome: Progressing   Problem: Life Cycle: Goal: Ability to make normal progression through stages of labor will improve Outcome: Progressing Goal: Ability to effectively push during vaginal delivery will improve Outcome: Progressing   Problem: Role Relationship: Goal: Will demonstrate positive interactions with the child Outcome: Progressing   Problem: Safety: Goal: Risk of complications during labor and delivery will decrease Outcome: Progressing   Problem: Pain Management: Goal: Relief or control of pain from uterine contractions will improve Outcome: Progressing   Problem: Education: Goal: Knowledge of condition will improve Outcome: Progressing Goal: Individualized Educational Video(s) Outcome: Progressing Goal: Individualized Newborn Educational Video(s) Outcome: Progressing   Problem: Activity: Goal: Will verbalize the importance of balancing activity with adequate rest periods Outcome: Progressing Goal: Ability to tolerate increased activity will improve Outcome: Progressing   Problem: Coping: Goal: Ability to identify and utilize available resources and services will improve Outcome: Progressing   Problem: Life Cycle: Goal: Chance of risk for complications during the postpartum period will decrease Outcome: Progressing   Problem: Role Relationship: Goal: Ability to demonstrate positive interaction with newborn will improve Outcome: Progressing   Problem: Skin Integrity: Goal: Demonstration of wound healing without infection will improve Outcome: Progressing

## 2024-12-04 NOTE — Patient Instructions (Signed)
 If you are interested in an outpatient lactation consultation -- available in-office or virtually -- please reach out to us  at:  [facility] (First Floor) ?? [address] [place], [place]  ?? [phone] Please leave a message on our lactation voicemail box. We welcome any lactation-related questions or concerns -- our team is here to support you and your baby.  Lactation Support Groups Join us  at: [facility] ?? Tuesdays, 10:00 AM - 12:00 PM ?? [address], [employer], [employer]  Lactating parents and lap babies are welcome!  ?? [email or web address]  ?? [email or web address]      Geraldina Louder, [facility]

## 2024-12-08 ENCOUNTER — Other Ambulatory Visit (HOSPITAL_BASED_OUTPATIENT_CLINIC_OR_DEPARTMENT_OTHER): Payer: Self-pay | Admitting: Obstetrics & Gynecology

## 2024-12-08 ENCOUNTER — Encounter (HOSPITAL_BASED_OUTPATIENT_CLINIC_OR_DEPARTMENT_OTHER): Payer: Self-pay

## 2024-12-08 ENCOUNTER — Ambulatory Visit (INDEPENDENT_AMBULATORY_CARE_PROVIDER_SITE_OTHER): Payer: MEDICAID

## 2024-12-08 VITALS — BP 128/79 | HR 83 | Wt 377.8 lb

## 2024-12-08 DIAGNOSIS — Z0130 Encounter for examination of blood pressure without abnormal findings: Secondary | ICD-10-CM | POA: Diagnosis not present

## 2024-12-08 DIAGNOSIS — R609 Edema, unspecified: Secondary | ICD-10-CM | POA: Diagnosis not present

## 2024-12-08 DIAGNOSIS — O99893 Other specified diseases and conditions complicating puerperium: Secondary | ICD-10-CM

## 2024-12-08 MED ORDER — POTASSIUM CHLORIDE CRYS ER 20 MEQ PO TBCR: 20.0000 meq | EXTENDED_RELEASE_TABLET | Freq: Every day | ORAL | 0 refills | Status: AC

## 2024-12-08 MED ORDER — FUROSEMIDE 20 MG PO TABS: 20.0000 mg | ORAL_TABLET | Freq: Every day | ORAL | 0 refills | Status: AC

## 2024-12-08 NOTE — Progress Notes (Signed)
 NURSE VISIT- BLOOD PRESSURE CHECK  SUBJECTIVE:  Whitney Beard is a 24 y.o. G26P1001 female here for BP check. She is postpartum, delivery date 12/01/2024    HYPERTENSION ROS:  Pregnant/postpartum:  Severe headaches that don't go away with tylenol /other medicines: No  Visual changes (seeing spots/double/blurred vision) No  Severe pain under right breast breast or in center of upper chest No  Severe nausea/vomiting No  Taking medicines as instructed no, patient did not receive medications before leaving the hospital. Patient states she took what was given to her upon discharge.   GYN patient: Taking medicines as instructed no Headaches  No Chest pain No Shortness of breath No Swelling in legs/ankles Yes  OBJECTIVE:  BP 128/79   Pulse 83   Wt (!) 377 lb 12.8 oz (171.4 kg)   LMP 02/25/2024 (Approximate)   Breastfeeding Yes Comment: pumping  BMI 60.98 kg/m   Appearance alert, well appearing, and in no distress and overweight.  ASSESSMENT: Postpartum  blood pressure check  PLAN: Discussed with Recommendations: new prescription will be sent  Rx have been resent into the pharmacy. Follow-up: as scheduled

## 2024-12-11 ENCOUNTER — Encounter (HOSPITAL_BASED_OUTPATIENT_CLINIC_OR_DEPARTMENT_OTHER): Payer: MEDICAID | Admitting: Obstetrics & Gynecology

## 2024-12-14 ENCOUNTER — Encounter (HOSPITAL_BASED_OUTPATIENT_CLINIC_OR_DEPARTMENT_OTHER): Payer: Self-pay

## 2024-12-14 ENCOUNTER — Other Ambulatory Visit (HOSPITAL_COMMUNITY)
Admission: RE | Admit: 2024-12-14 | Discharge: 2024-12-14 | Disposition: A | Payer: MEDICAID | Source: Ambulatory Visit | Attending: Obstetrics and Gynecology | Admitting: Obstetrics and Gynecology

## 2024-12-14 ENCOUNTER — Ambulatory Visit (HOSPITAL_BASED_OUTPATIENT_CLINIC_OR_DEPARTMENT_OTHER): Payer: MEDICAID | Admitting: Obstetrics and Gynecology

## 2024-12-14 VITALS — BP 134/87 | HR 87 | Wt 379.0 lb

## 2024-12-14 DIAGNOSIS — N898 Other specified noninflammatory disorders of vagina: Secondary | ICD-10-CM

## 2024-12-14 DIAGNOSIS — Z30431 Encounter for routine checking of intrauterine contraceptive device: Secondary | ICD-10-CM

## 2024-12-14 DIAGNOSIS — T8332XA Displacement of intrauterine contraceptive device, initial encounter: Secondary | ICD-10-CM | POA: Diagnosis not present

## 2024-12-14 DIAGNOSIS — O0990 Supervision of high risk pregnancy, unspecified, unspecified trimester: Secondary | ICD-10-CM

## 2024-12-14 DIAGNOSIS — Z30432 Encounter for removal of intrauterine contraceptive device: Secondary | ICD-10-CM | POA: Diagnosis not present

## 2024-12-14 DIAGNOSIS — R102 Pelvic and perineal pain unspecified side: Secondary | ICD-10-CM | POA: Diagnosis not present

## 2024-12-14 NOTE — Progress Notes (Signed)
" ° °  GYNECOLOGY PROGRESS NOTE  History:  24 y.o. G1P1001 presents to [facility] for problem visit. She believes her IUD might be coming out. She had it placed postplacental 12/02/24. Denies dysuria, but endorses vagina burns when urine hits. Endorses vaginal odor,  and vaginal pain with movement that has not gone away since delivery.She reports that pain was extremely uncomfortable, and now a little bit more intolerable. Denies fever, abdominal pain  Health Maintenance Due  Topic Date Due   COVID-19 Vaccine (1) Never done   HPV VACCINES (1 - Risk 3-dose series) Never done     Review of Systems:  Pertinent items are noted in HPI.   Objective:  Physical Exam Blood pressure 134/87, pulse 87, weight (!) 379 lb (171.9 kg), last menstrual period 02/25/2024, currently breastfeeding. VS reviewed, nursing note reviewed,  Constitutional: well developed, well nourished, no distress HEENT: normocephalic CV: normal rate Pulm/chest wall: normal effort Breast Exam: deferred Abdomen: soft Neuro: alert and oriented  Skin: warm, dry Psych: affect normal Pelvic exam: Cervix pink, visually closed, without lesion, white/yellow discharge noted, vaginal walls and external genitalia normal, IUD protruding from cervix, tenderness on exam    IUD Removal  Patient identified, informed consent performed, consent signed.  Patient was in the dorsal lithotomy position, normal external genitalia was noted.  A speculum was placed in the patient's vagina,white/yellow d/c noted. The cervix was visualized.  The base of IUD was protruding from cervix, strings of the IUD were grasped and pulled using ring forceps. The IUD was removed in its entirety. Tender on exam Patient tolerated the procedure well.    Routine preventative health maintenance measures emphasized.   Assessment & Plan:  1. IUD check up (Primary) IUD malpositioned, protruding from cervix, discussed removal, does not desire replacement at this time   2.  Vaginal discharge 3. Vaginal pain Swab collected today, follow up pending swabs Also encouraged dermaplast, ice pack, and ibuprofen  Follow up if precautions discussed  - [facility]   Future Appointments  Date Time Provider Department Center  01/01/2025 11:00 AM [facility] HOME SLEEP STUDY [facility] [address]  01/11/2025  1:15 PM Delores Nidia CROME, FNP [facility] [address]  03/05/2025  2:00 PM Neda Jennet LABOR, MD [facility] [address]     Nidia Delores, FNP 3:31 PM "

## 2024-12-15 LAB — CERVICOVAGINAL ANCILLARY ONLY
Bacterial Vaginitis (gardnerella): POSITIVE — AB
Candida Glabrata: NEGATIVE
Candida Vaginitis: NEGATIVE
Chlamydia: NEGATIVE
Comment: NEGATIVE
Comment: NEGATIVE
Comment: NEGATIVE
Comment: NEGATIVE
Comment: NEGATIVE
Comment: NORMAL
Neisseria Gonorrhea: NEGATIVE
Trichomonas: NEGATIVE

## 2024-12-16 ENCOUNTER — Ambulatory Visit: Payer: Self-pay | Admitting: Obstetrics and Gynecology

## 2024-12-16 MED ORDER — METRONIDAZOLE 500 MG PO TABS: 500.0000 mg | ORAL_TABLET | Freq: Two times a day (BID) | ORAL | 0 refills | Status: AC

## 2024-12-18 ENCOUNTER — Encounter (HOSPITAL_BASED_OUTPATIENT_CLINIC_OR_DEPARTMENT_OTHER): Payer: Self-pay

## 2024-12-18 ENCOUNTER — Encounter (HOSPITAL_BASED_OUTPATIENT_CLINIC_OR_DEPARTMENT_OTHER): Payer: MEDICAID | Admitting: Certified Nurse Midwife

## 2024-12-18 DIAGNOSIS — O0990 Supervision of high risk pregnancy, unspecified, unspecified trimester: Secondary | ICD-10-CM

## 2024-12-19 ENCOUNTER — Encounter (HOSPITAL_BASED_OUTPATIENT_CLINIC_OR_DEPARTMENT_OTHER): Payer: MEDICAID | Admitting: Obstetrics and Gynecology

## 2025-01-05 ENCOUNTER — Inpatient Hospital Stay (HOSPITAL_COMMUNITY): Payer: MEDICAID

## 2025-01-05 ENCOUNTER — Inpatient Hospital Stay (HOSPITAL_COMMUNITY)
Admission: AD | Admit: 2025-01-05 | Discharge: 2025-01-05 | Disposition: A | Discharge status: Home / Self Care | Payer: MEDICAID | Source: Home / Self Care | Attending: Obstetrics & Gynecology | Admitting: Obstetrics & Gynecology

## 2025-01-05 DIAGNOSIS — R1032 Left lower quadrant pain: Secondary | ICD-10-CM

## 2025-01-05 DIAGNOSIS — N8300 Follicular cyst of ovary, unspecified side: Secondary | ICD-10-CM

## 2025-01-05 LAB — CBC WITH DIFFERENTIAL/PLATELET
Abs Immature Granulocytes: 0.03 10*3/uL (ref 0.00–0.07)
Basophils Absolute: 0 10*3/uL (ref 0.0–0.1)
Basophils Relative: 1 %
Eosinophils Absolute: 0.1 10*3/uL (ref 0.0–0.5)
Eosinophils Relative: 1 %
HCT: 33.2 % — ABNORMAL LOW (ref 36.0–46.0)
Hemoglobin: 11 g/dL — ABNORMAL LOW (ref 12.0–15.0)
Immature Granulocytes: 0 %
Lymphocytes Relative: 33 %
Lymphs Abs: 2.7 10*3/uL (ref 0.7–4.0)
MCH: 29.7 pg (ref 26.0–34.0)
MCHC: 33.1 g/dL (ref 30.0–36.0)
MCV: 89.7 fL (ref 80.0–100.0)
Monocytes Absolute: 0.4 10*3/uL (ref 0.1–1.0)
Monocytes Relative: 5 %
Neutro Abs: 5 10*3/uL (ref 1.7–7.7)
Neutrophils Relative %: 60 %
Platelets: 370 10*3/uL (ref 150–400)
RBC: 3.7 MIL/uL — ABNORMAL LOW (ref 3.87–5.11)
RDW: 13.8 % (ref 11.5–15.5)
WBC: 8.3 10*3/uL (ref 4.0–10.5)
nRBC: 0 % (ref 0.0–0.2)

## 2025-01-05 LAB — COMPREHENSIVE METABOLIC PANEL WITH GFR
ALT: 9 U/L (ref 0–44)
AST: 12 U/L — ABNORMAL LOW (ref 15–41)
Albumin: 3.7 g/dL (ref 3.5–5.0)
Alkaline Phosphatase: 89 U/L (ref 38–126)
Anion gap: 11 (ref 5–15)
BUN: 10 mg/dL (ref 6–20)
CO2: 23 mmol/L (ref 22–32)
Calcium: 8.6 mg/dL — ABNORMAL LOW (ref 8.9–10.3)
Chloride: 103 mmol/L (ref 98–111)
Creatinine, Ser: 0.73 mg/dL (ref 0.44–1.00)
GFR, Estimated: >60 mL/min
Glucose, Bld: 114 mg/dL — ABNORMAL HIGH (ref 70–99)
Potassium: 3.9 mmol/L (ref 3.5–5.1)
Sodium: 137 mmol/L (ref 135–145)
Total Bilirubin: 0.5 mg/dL (ref 0.0–1.2)
Total Protein: 7 g/dL (ref 6.5–8.1)

## 2025-01-05 LAB — LIPASE, BLOOD: Lipase: 15 U/L (ref 11–51)

## 2025-01-05 MED ORDER — ALUM & MAG HYDROXIDE-SIMETH 200-200-20 MG/5ML PO SUSP
30.0000 mL | Freq: Once | ORAL | Status: AC
  Administered 2025-01-05: 30 mL via ORAL
  Filled 2025-01-05: qty 30

## 2025-01-05 MED ORDER — PANTOPRAZOLE SODIUM 20 MG PO TBEC
20.0000 mg | DELAYED_RELEASE_TABLET | Freq: Once | ORAL | Status: AC
  Administered 2025-01-05: 20 mg via ORAL
  Filled 2025-01-05 (×2): qty 1

## 2025-01-05 MED ORDER — LIDOCAINE VISCOUS HCL 2 % MT SOLN
15.0000 mL | Freq: Once | OROMUCOSAL | Status: AC
  Administered 2025-01-05: 15 mL via ORAL
  Filled 2025-01-05: qty 15

## 2025-01-05 NOTE — MAU Provider Note (Cosign Needed Addendum)
 [facility] Attending MAU Note  Chief Complaint: Abdominal Pain (/)   Event Date/Time   First Provider Initiated Contact with Patient 01/05/25 2045     SUBJECTIVE Whitney Beard is a 24 y.o. G1P1001 at 4 weeks PP from [facility] who presents with LLQ starting today. Notes some nausea as well. Denies fever chills. No longer nursing. Normal BM today. No blood noted. No longer having vaginal bleeding.  Past Medical History:  Diagnosis Date   ADHD (attention deficit hyperactivity disorder)    Allergic rhinitis    Asthma    BMI 45.0-49.9, adult (HCC)    Borderline personality disorder (HCC)    Depression    Migraines    Obesity    Prediabetes    states today 11/11/20 was told in the past that she was pre- diabetic   Sleep apnea    Suicide attempt (HCC)    OB History  Gravida Para Term Preterm AB Living  1 1 1   1   SAB IAB Ectopic Multiple Live Births     0 1    # Outcome Date GA Lbr Len/2nd Weight Sex Type Anes PTL Lv  1 Term 12/02/24 [redacted]w[redacted]d / 02:58 6 lb 4.5 oz (2.85 kg) F Vag-Spont EPI  LIV   Past Surgical History:  Procedure Laterality Date   ADENOIDECTOMY     TONSILLECTOMY     WISDOM TOOTH EXTRACTION Bilateral    Social History   Socioeconomic History   Marital status: Single    Spouse name: Not on file   Number of children: 0   Years of education: Not on file   Highest education level: 12th grade  Occupational History   Not on file  Tobacco Use   Smoking status: Former    Current packs/day: 0.50    Types: Cigarettes   Smokeless tobacco: Never   Tobacco comments:    vapes   Vaping Use   Vaping status: Former  Substance and Sexual Activity   Alcohol use: Not Currently   Drug use: Not Currently   Sexual activity: Yes    Birth control/protection: None  Other Topics Concern   Not on file  Social History Narrative   Lives with mom.    Social Drivers of Health   Tobacco Use: Medium Risk (12/08/2024)   Patient History    Smoking Tobacco Use:  Former    Smokeless Tobacco Use: Never    Passive Exposure: Not on file  Financial Resource Strain: Medium Risk (08/09/2024)   Overall Financial Resource Strain (CARDIA)    Difficulty of Paying Living Expenses: Somewhat hard  Food Insecurity: No Food Insecurity (12/01/2024)   Epic    Worried About Programme Researcher, Broadcasting/film/video in the Last Year: Never true    Ran Out of Food in the Last Year: Never true  Transportation Needs: No Transportation Needs (12/01/2024)   Epic    Lack of Transportation (Medical): No    Lack of Transportation (Non-Medical): No  Physical Activity: Insufficiently Active (08/09/2024)   Exercise Vital Sign    Days of Exercise per Week: 3 days    Minutes of Exercise per Session: 20 min  Stress: No Stress Concern Present (09/18/2024)   Received from [facility] - Occupational Stress Questionnaire    Do you feel stress - tense, restless, nervous, or anxious, or unable to sleep at night because your mind is troubled all the time - these days?: Only a little  Recent Concern:  Stress - Stress Concern Present (08/09/2024)   [employer] [facility] Occupational [facility] - Occupational Stress Questionnaire    Feeling of Stress: Rather much  Social Connections: Socially Isolated (12/01/2024)   Social Connection and Isolation Panel    Frequency of Communication with Friends and Family: Three times a week    Frequency of Social Gatherings with Friends and Family: Three times a week    Attends Religious Services: Never    Active Member of Clubs or Organizations: No    Attends Banker Meetings: Never    Marital Status: Never married  Intimate Partner Violence: Not At Risk (12/01/2024)   Epic    Fear of Current or Ex-Partner: No    Emotionally Abused: No    Physically Abused: No    Sexually Abused: No  Depression (PHQ2-9): Medium Risk (09/11/2024)   Depression (PHQ2-9)    PHQ-2 Score: 5  Alcohol Screen: Medium Risk (05/22/2024)   Alcohol  Screen    Last Alcohol Screening Score (AUDIT): 9  Housing: Low Risk (12/01/2024)   Epic    Unable to Pay for Housing in the Last Year: No    Number of Times Moved in the Last Year: 0    Homeless in the Last Year: No  Utilities: Not At Risk (12/01/2024)   Epic    Threatened with loss of utilities: No  Health Literacy: Adequate Health Literacy (05/22/2024)   B1300 Health Literacy    Frequency of need for help with medical instructions: Rarely   Medications Ordered Prior to Encounter[1] Allergies[2]  ROS: Pertinent items in HPI  OBJECTIVE BP 130/75 (BP Location: Right Arm)   Pulse 84   Temp 98.3 F (36.8 C) (Oral)   Resp 12   Ht 5' 6 (1.676 m)   Wt (!) 366 lb 14.4 oz (166.4 kg)   LMP 02/25/2024 (Approximate)   BMI 59.22 kg/m  CONSTITUTIONAL: Well-developed, well-nourished female in no acute distress.  HENT:  Normocephalic, atraumatic EYES: Conjunctivae and EOM are normal. No scleral icterus.  NECK: Normal range of motion, supple, no masses.  Normal thyroid .  SKIN: Skin is warm and dry. No rash noted. Not diaphoretic. No erythema. No pallor. NEUROLGIC: Alert and oriented to person, place, and time. CARDIOVASCULAR: Normal heart rate noted RESPIRATORY: Effort normal, no problems with respiration noted. ABDOMEN: Soft, normal bowel sounds, no distention noted.  LLQ tenderness, no rebound or guarding.   LAB RESULTS Results for orders placed or performed during the hospital encounter of 01/05/25 (from the past 48 hours)  CBC with Differential/Platelet     Status: Abnormal   Collection Time: 01/05/25 10:21 PM  Result Value Ref Range   WBC 8.3 4.0 - 10.5 K/uL   RBC 3.70 (L) 3.87 - 5.11 MIL/uL   Hemoglobin 11.0 (L) 12.0 - 15.0 g/dL   HCT 66.7 (L) 63.9 - 53.9 %   MCV 89.7 80.0 - 100.0 fL   MCH 29.7 26.0 - 34.0 pg   MCHC 33.1 30.0 - 36.0 g/dL   RDW 86.1 88.4 - 84.4 %   Platelets 370 150 - 400 K/uL   nRBC 0.0 0.0 - 0.2 %   Neutrophils Relative % 60 %   Neutro Abs 5.0 1.7 - 7.7  K/uL   Lymphocytes Relative 33 %   Lymphs Abs 2.7 0.7 - 4.0 K/uL   Monocytes Relative 5 %   Monocytes Absolute 0.4 0.1 - 1.0 K/uL   Eosinophils Relative 1 %   Eosinophils Absolute 0.1 0.0 - 0.5 K/uL  Basophils Relative 1 %   Basophils Absolute 0.0 0.0 - 0.1 K/uL   Immature Granulocytes 0 %   Abs Immature Granulocytes 0.03 0.00 - 0.07 K/uL    Comment: Performed at [facility], [address] [place], [place] [address]  Lipase, blood     Status: None   Collection Time: 01/05/25 10:21 PM  Result Value Ref Range   Lipase 15 11 - 51 U/L    Comment: Performed at [facility], [address] [place], [place] [address]  Comprehensive metabolic panel     Status: Abnormal   Collection Time: 01/05/25 10:21 PM  Result Value Ref Range   Sodium 137 135 - 145 mmol/L   Potassium 3.9 3.5 - 5.1 mmol/L   Chloride 103 98 - 111 mmol/L   CO2 23 22 - 32 mmol/L   Glucose, Bld 114 (H) 70 - 99 mg/dL    Comment: Glucose reference range applies only to samples taken after fasting for at least 8 hours.   BUN 10 6 - 20 mg/dL   Creatinine, Ser 9.26 0.44 - 1.00 mg/dL   Calcium  8.6 (L) 8.9 - 10.3 mg/dL   Total Protein 7.0 6.5 - 8.1 g/dL   Albumin 3.7 3.5 - 5.0 g/dL   AST 12 (L) 15 - 41 U/L   ALT 9 0 - 44 U/L   Alkaline Phosphatase 89 38 - 126 U/L   Total Bilirubin 0.5 0.0 - 1.2 mg/dL   GFR, Estimated >39 >39 mL/min    Comment: (NOTE) Calculated using the CKD-EPI Creatinine Equation (2021)    Anion gap 11 5 - 15    Comment: Performed at [facility], [address] [place], [place] [address]   IMAGING US  PELVIS (TRANSABDOMINAL ONLY) Result Date: 01/05/2025 EXAM: PELVIC ULTRASOUND 01/05/2025 10:40:14 PM TECHNIQUE: Transabdominal pelvic duplex ultrasound using B-mode/gray scaled imaging with Doppler spectral analysis and color flow was obtained. COMPARISON: None available. CLINICAL HISTORY: Left lower quadrant pain. Status post vaginal delivery December 02 2024, vaginal bleeding.  FINDINGS: ULTRASOUND FINDINGS: UTERUS: Uterus measures 11.0 x 5.3 x 6.9 cm with a volume of 208.8 cc. The uterus is anteverted. Normal myometrial echotexture. The cervix is unremarkable. No intrauterine masses are seen. ENDOMETRIUM: Endometrium measures 15 mm in thickness. RIGHT OVARY: Right ovary measures 2.6 x 4.4 x 1.5 cm with a volume of 8.9 cc. Normal arterial and venous Doppler flow. Normal appearance. LEFT OVARY: Left ovary measures 4.4 x 2.3 x 3.0 cm with a volume of 15.7 cc. Dominant follicle seen within the left ovary. Normal arterial and venous Doppler flow. FREE FLUID: No free fluid. IMPRESSION: 1. Nonspecific endometrial thickening measuring 15 mm; may be physiologic postpartum, though retained products of conception are not excluded on this exam-consider repeat pelvic ultrasound if bleeding persists. Electronically signed by: Dorethia Molt MD 01/05/2025 10:47 PM EST RP Workstation: [id number]   MAU COURSE Given Protonix  Given GI cocktail Labs ordered  Care turned over to DOROTHA Finder CNM  Assumed care of patient. Pt's mother called in to request evaluation of her ovaries, so put in pelvic U/S and let patient know.  Reviewed results and informed pt of follicular cyst with reassurance. Pt stable for discharge home.  ASSESSMENT 1. Postpartum state   2. Left lower quadrant abdominal pain   3. Follicle cyst    PLAN Evaluation does not show pathology that would require ongoing emergent intervention or inpatient treatment. Patient is hemodynamically stable and mentating appropriately. Discussed findings and plan  with patient, who agrees with care plan. All questions answered. Return precautions discussed and outpatient follow up recommendations given.   Follow-up Information     [facility] Follow up.   Specialty: Obstetrics and Gynecology Why: keep next scheduled appointment Contact information: [address] [place]  [place]  [address] [phone]               Allergies as of 01/05/2025       Reactions   Other Shortness Of Breath, Other (See Comments)   Animals such as cats and dogs and pollen - sneezing, asthma trigger         Medication List     TAKE these medications    acetaminophen  325 MG tablet Commonly known as: Tylenol  Take 2 tablets (650 mg total) by mouth every 4 (four) hours as needed (for pain scale < 4).   aluminum -magnesium  hydroxide-simethicone  200-200-20 MG/5ML Susp Commonly known as: MAALOX Take 30 mLs by mouth 3 (three) times daily as needed (upper abdominal pain/acid reflux).   benzocaine -Menthol  20-0.5 % Aero Commonly known as: DERMOPLAST Apply 1 Application topically as needed for irritation (perineal discomfort).   Blood Pressure Kit Devi Check BP daily   budesonide -formoterol  80-4.5 MCG/ACT inhaler Commonly known as: SYMBICORT  Inhale 2 puffs into the lungs.   busPIRone  10 MG tablet Commonly known as: BUSPAR  Take 1 tablet (10 mg total) by mouth 3 (three) times daily as needed.   coconut oil Oil Apply 1 Application topically as needed.   dibucaine 1 % Oint Commonly known as: NUPERCAINAL Place 1 Application rectally as needed for hemorrhoids.   famotidine  20 MG tablet Commonly known as: PEPCID  Take 1 tablet (20 mg total) by mouth 2 (two) times daily.   furosemide  20 MG tablet Commonly known as: LASIX  Take 1 tablet (20 mg total) by mouth daily.   ibuprofen  600 MG tablet Commonly known as: ADVIL  Take 1 tablet (600 mg total) by mouth every 6 (six) hours.   NIFEdipine  30 MG 24 hr tablet Commonly known as: PROCARDIA -XL/NIFEDICAL-XL Take 1 tablet (30 mg total) by mouth daily.   ondansetron  4 MG tablet Commonly known as: Zofran  Take 1 tablet (4 mg total) by mouth every 8 (eight) hours as needed for nausea or vomiting.   polyethylene glycol powder 17 GM/SCOOP powder Commonly known as: MiraLax  Dissolve 1 capful (17g) in a large glass  of water and take by mouth twice a day for 3-5 days until you are having regular bowel movements, then decrease to once daily after that for maintenance of daily bowel movements   potassium chloride  SA 20 MEQ tablet Commonly known as: KLOR-CON  M Take 1 tablet (20 mEq total) by mouth daily.   prenatal vitamin w/FE, FA 27-1 MG Tabs tablet Take 1 tablet by mouth daily at 12 noon.   Ventolin  HFA 108 (90 Base) MCG/ACT inhaler Generic drug: albuterol  Inhale 2 puffs into the lungs every 4 (four) hours as needed for wheezing or shortness of breath.   albuterol  (2.5 MG/3ML) 0.083% nebulizer solution Commonly known as: PROVENTIL  Take 3 mLs (2.5 mg total) by nebulization every 4 (four) hours as needed for wheezing or shortness of breath.   witch hazel-glycerin  pad Commonly known as: TUCKS Apply 1 Application topically as needed for hemorrhoids.         Vannie Cornell SAUNDERS, CNM 01/05/2025 11:20 PM     [1]  No current facility-administered medications on file prior to encounter.   Current Outpatient Medications  on File Prior to Encounter  Medication Sig Dispense Refill   acetaminophen  (TYLENOL ) 325 MG tablet Take 2 tablets (650 mg total) by mouth every 4 (four) hours as needed (for pain scale < 4). 30 tablet 0   albuterol  (PROVENTIL ) (2.5 MG/3ML) 0.083% nebulizer solution Take 3 mLs (2.5 mg total) by nebulization every 4 (four) hours as needed for wheezing or shortness of breath. 75 mL 2   albuterol  (VENTOLIN  HFA) 108 (90 Base) MCG/ACT inhaler Inhale 2 puffs into the lungs every 4 (four) hours as needed for wheezing or shortness of breath. 18 g 1   budesonide -formoterol  (SYMBICORT ) 80-4.5 MCG/ACT inhaler Inhale 2 puffs into the lungs.     famotidine  (PEPCID ) 20 MG tablet Take 1 tablet (20 mg total) by mouth 2 (two) times daily. 30 tablet 0   ibuprofen  (ADVIL ) 600 MG tablet Take 1 tablet (600 mg total) by mouth every 6 (six) hours. 30 tablet 0   prenatal vitamin w/FE, FA (PRENATAL 1 + 1) 27-1  MG TABS tablet Take 1 tablet by mouth daily at 12 noon. 30 tablet 12   aluminum -magnesium  hydroxide-simethicone  (MAALOX) 200-200-20 MG/5ML SUSP Take 30 mLs by mouth 3 (three) times daily as needed (upper abdominal pain/acid reflux). 1680 mL 2   benzocaine -Menthol  (DERMOPLAST) 20-0.5 % AERO Apply 1 Application topically as needed for irritation (perineal discomfort).     Blood Pressure Monitoring (BLOOD PRESSURE KIT) DEVI Check BP daily 1 each 0   busPIRone  (BUSPAR ) 10 MG tablet Take 1 tablet (10 mg total) by mouth 3 (three) times daily as needed. 30 tablet 0   coconut oil OIL Apply 1 Application topically as needed. (Patient not taking: Reported on 12/14/2024)     dibucaine (NUPERCAINAL) 1 % OINT Place 1 Application rectally as needed for hemorrhoids. (Patient not taking: Reported on 12/14/2024)     furosemide  (LASIX ) 20 MG tablet Take 1 tablet (20 mg total) by mouth daily. 4 tablet 0   NIFEdipine  (PROCARDIA -XL/NIFEDICAL-XL) 30 MG 24 hr tablet Take 1 tablet (30 mg total) by mouth daily. 90 tablet 0   ondansetron  (ZOFRAN ) 4 MG tablet Take 1 tablet (4 mg total) by mouth every 8 (eight) hours as needed for nausea or vomiting. (Patient not taking: Reported on 12/14/2024) 30 tablet 3   polyethylene glycol powder (MIRALAX ) 17 GM/SCOOP powder Dissolve 1 capful (17g) in a large glass of water and take by mouth twice a day for 3-5 days until you are having regular bowel movements, then decrease to once daily after that for maintenance of daily bowel movements (Patient not taking: No sig reported) 238 g 2   potassium chloride  SA (KLOR-CON  M) 20 MEQ tablet Take 1 tablet (20 mEq total) by mouth daily. (Patient not taking: Reported on 12/14/2024) 4 tablet 0   witch hazel-glycerin  (TUCKS) pad Apply 1 Application topically as needed for hemorrhoids. (Patient not taking: Reported on 12/14/2024)    [2]  Allergies Allergen Reactions   Other Shortness Of Breath and Other (See Comments)    Animals such as cats and dogs and  pollen - sneezing, asthma trigger

## 2025-01-05 NOTE — MAU Note (Addendum)
 Pt says she del vag - on Sat 12-02-2024- then went home on Mon 12-04-2024- all ok- had an IUD inserted - then developed a vag infection- is still taking antx- doesn't know what antx .  They removed IUD- 12-14-2024 Tonight was at work- then at 530pm- she got a sudden sharp pain in lower abd 2/10- no meds . Then got worse - radiating to left back - no pain meds . Came straight here.  Now- 7/10

## 2025-01-11 ENCOUNTER — Ambulatory Visit (HOSPITAL_BASED_OUTPATIENT_CLINIC_OR_DEPARTMENT_OTHER): Payer: Self-pay | Admitting: Obstetrics and Gynecology

## 2025-03-05 ENCOUNTER — Ambulatory Visit: Payer: MEDICAID | Admitting: Pulmonary Disease
